# Patient Record
Sex: Female | Born: 1960 | Race: Black or African American | Hispanic: No | Marital: Single | Smoking: Never smoker
Health system: Southern US, Community
[De-identification: ages and names within clinical notes are randomized; demographics above are authoritative.]

## PROBLEM LIST (undated history)

## (undated) DIAGNOSIS — M1990 Unspecified osteoarthritis, unspecified site: Secondary | ICD-10-CM

## (undated) DIAGNOSIS — E119 Type 2 diabetes mellitus without complications: Secondary | ICD-10-CM

## (undated) DIAGNOSIS — F419 Anxiety disorder, unspecified: Secondary | ICD-10-CM

## (undated) DIAGNOSIS — I10 Essential (primary) hypertension: Secondary | ICD-10-CM

## (undated) DIAGNOSIS — R51 Headache: Secondary | ICD-10-CM

## (undated) DIAGNOSIS — N938 Other specified abnormal uterine and vaginal bleeding: Secondary | ICD-10-CM

## (undated) DIAGNOSIS — E039 Hypothyroidism, unspecified: Secondary | ICD-10-CM

## (undated) DIAGNOSIS — D649 Anemia, unspecified: Secondary | ICD-10-CM

## (undated) DIAGNOSIS — K219 Gastro-esophageal reflux disease without esophagitis: Secondary | ICD-10-CM

## (undated) DIAGNOSIS — G4733 Obstructive sleep apnea (adult) (pediatric): Secondary | ICD-10-CM

## (undated) DIAGNOSIS — H469 Unspecified optic neuritis: Secondary | ICD-10-CM

## (undated) DIAGNOSIS — Z8711 Personal history of peptic ulcer disease: Secondary | ICD-10-CM

## (undated) DIAGNOSIS — I2699 Other pulmonary embolism without acute cor pulmonale: Secondary | ICD-10-CM

## (undated) DIAGNOSIS — E739 Lactose intolerance, unspecified: Secondary | ICD-10-CM

## (undated) DIAGNOSIS — R600 Localized edema: Secondary | ICD-10-CM

## (undated) DIAGNOSIS — M2550 Pain in unspecified joint: Secondary | ICD-10-CM

## (undated) DIAGNOSIS — R0600 Dyspnea, unspecified: Secondary | ICD-10-CM

## (undated) DIAGNOSIS — Z91018 Allergy to other foods: Secondary | ICD-10-CM

## (undated) DIAGNOSIS — F329 Major depressive disorder, single episode, unspecified: Secondary | ICD-10-CM

## (undated) DIAGNOSIS — I82409 Acute embolism and thrombosis of unspecified deep veins of unspecified lower extremity: Secondary | ICD-10-CM

## (undated) DIAGNOSIS — M549 Dorsalgia, unspecified: Secondary | ICD-10-CM

## (undated) DIAGNOSIS — F32A Depression, unspecified: Secondary | ICD-10-CM

## (undated) HISTORY — DX: Allergy to other foods: Z91.018

## (undated) HISTORY — DX: Hypothyroidism, unspecified: E03.9

## (undated) HISTORY — DX: Acute embolism and thrombosis of unspecified deep veins of unspecified lower extremity: I82.409

## (undated) HISTORY — DX: Unspecified osteoarthritis, unspecified site: M19.90

## (undated) HISTORY — PX: DILATION AND CURETTAGE OF UTERUS: SHX78

## (undated) HISTORY — DX: Personal history of peptic ulcer disease: Z87.11

## (undated) HISTORY — DX: Other specified abnormal uterine and vaginal bleeding: N93.8

## (undated) HISTORY — DX: Major depressive disorder, single episode, unspecified: F32.9

## (undated) HISTORY — PX: GASTRIC BYPASS: SHX52

## (undated) HISTORY — DX: Dorsalgia, unspecified: M54.9

## (undated) HISTORY — PX: ABDOMINAL HYSTERECTOMY: SHX81

## (undated) HISTORY — DX: Gastro-esophageal reflux disease without esophagitis: K21.9

## (undated) HISTORY — PX: HERNIA REPAIR: SHX51

## (undated) HISTORY — DX: Lactose intolerance, unspecified: E73.9

## (undated) HISTORY — DX: Depression, unspecified: F32.A

## (undated) HISTORY — PX: ENDOMETRIAL ABLATION: SHX621

## (undated) HISTORY — DX: Dyspnea, unspecified: R06.00

## (undated) HISTORY — DX: Morbid (severe) obesity due to excess calories: E66.01

## (undated) HISTORY — DX: Pain in unspecified joint: M25.50

## (undated) HISTORY — DX: Unspecified optic neuritis: H46.9

## (undated) HISTORY — DX: Other pulmonary embolism without acute cor pulmonale: I26.99

## (undated) HISTORY — DX: Essential (primary) hypertension: I10

## (undated) HISTORY — PX: HYSTEROSCOPY: SHX211

## (undated) HISTORY — DX: Type 2 diabetes mellitus without complications: E11.9

## (undated) HISTORY — PX: OTHER SURGICAL HISTORY: SHX169

## (undated) HISTORY — DX: Anxiety disorder, unspecified: F41.9

## (undated) HISTORY — DX: Localized edema: R60.0

## (undated) HISTORY — DX: Obstructive sleep apnea (adult) (pediatric): G47.33

## (undated) HISTORY — DX: Anemia, unspecified: D64.9

## (undated) HISTORY — PX: JOINT REPLACEMENT: SHX530

## (undated) SURGERY — MINOR CAPSULOTOMY
Anesthesia: Choice | Laterality: Right

---

## 1998-01-17 ENCOUNTER — Other Ambulatory Visit: Admission: RE | Admit: 1998-01-17 | Discharge: 1998-01-17 | Payer: Self-pay | Admitting: Obstetrics and Gynecology

## 1998-01-22 ENCOUNTER — Encounter: Payer: Self-pay | Admitting: Obstetrics and Gynecology

## 1998-01-22 ENCOUNTER — Ambulatory Visit (HOSPITAL_COMMUNITY): Admission: RE | Admit: 1998-01-22 | Discharge: 1998-01-22 | Payer: Self-pay | Admitting: Obstetrics and Gynecology

## 2000-05-10 ENCOUNTER — Other Ambulatory Visit: Admission: RE | Admit: 2000-05-10 | Discharge: 2000-05-10 | Payer: Self-pay | Admitting: Obstetrics and Gynecology

## 2000-06-26 ENCOUNTER — Ambulatory Visit (HOSPITAL_COMMUNITY): Admission: RE | Admit: 2000-06-26 | Discharge: 2000-06-26 | Payer: Self-pay | Admitting: Obstetrics and Gynecology

## 2000-06-26 ENCOUNTER — Encounter (INDEPENDENT_AMBULATORY_CARE_PROVIDER_SITE_OTHER): Payer: Self-pay

## 2000-06-30 ENCOUNTER — Encounter: Admission: RE | Admit: 2000-06-30 | Discharge: 2000-06-30 | Payer: Self-pay | Admitting: Family Medicine

## 2000-06-30 ENCOUNTER — Encounter: Payer: Self-pay | Admitting: Family Medicine

## 2000-07-29 ENCOUNTER — Ambulatory Visit (HOSPITAL_BASED_OUTPATIENT_CLINIC_OR_DEPARTMENT_OTHER): Admission: RE | Admit: 2000-07-29 | Discharge: 2000-07-29 | Payer: Self-pay | Admitting: Family Medicine

## 2000-12-14 ENCOUNTER — Encounter: Admission: RE | Admit: 2000-12-14 | Discharge: 2000-12-14 | Payer: Self-pay | Admitting: Family Medicine

## 2000-12-14 ENCOUNTER — Encounter: Payer: Self-pay | Admitting: Family Medicine

## 2001-06-03 ENCOUNTER — Other Ambulatory Visit: Admission: RE | Admit: 2001-06-03 | Discharge: 2001-06-03 | Payer: Self-pay | Admitting: Obstetrics and Gynecology

## 2001-11-01 ENCOUNTER — Emergency Department (HOSPITAL_COMMUNITY): Admission: EM | Admit: 2001-11-01 | Discharge: 2001-11-01 | Payer: Self-pay | Admitting: Emergency Medicine

## 2002-06-08 ENCOUNTER — Other Ambulatory Visit: Admission: RE | Admit: 2002-06-08 | Discharge: 2002-06-08 | Payer: Self-pay | Admitting: Obstetrics and Gynecology

## 2003-06-21 ENCOUNTER — Other Ambulatory Visit: Admission: RE | Admit: 2003-06-21 | Discharge: 2003-06-21 | Payer: Self-pay | Admitting: Obstetrics and Gynecology

## 2004-05-19 ENCOUNTER — Inpatient Hospital Stay (HOSPITAL_COMMUNITY): Admission: EM | Admit: 2004-05-19 | Discharge: 2004-05-25 | Payer: Self-pay | Admitting: Emergency Medicine

## 2004-06-24 ENCOUNTER — Other Ambulatory Visit: Admission: RE | Admit: 2004-06-24 | Discharge: 2004-06-24 | Payer: Self-pay | Admitting: Obstetrics and Gynecology

## 2004-07-05 ENCOUNTER — Emergency Department (HOSPITAL_COMMUNITY): Admission: EM | Admit: 2004-07-05 | Discharge: 2004-07-05 | Payer: Self-pay | Admitting: Emergency Medicine

## 2005-06-15 ENCOUNTER — Encounter: Admission: RE | Admit: 2005-06-15 | Discharge: 2005-06-15 | Payer: Self-pay | Admitting: Specialist

## 2005-06-24 ENCOUNTER — Other Ambulatory Visit: Admission: RE | Admit: 2005-06-24 | Discharge: 2005-06-24 | Payer: Self-pay | Admitting: *Deleted

## 2006-06-25 ENCOUNTER — Ambulatory Visit (HOSPITAL_COMMUNITY): Admission: RE | Admit: 2006-06-25 | Discharge: 2006-06-25 | Payer: Self-pay | Admitting: Specialist

## 2006-07-14 ENCOUNTER — Other Ambulatory Visit: Admission: RE | Admit: 2006-07-14 | Discharge: 2006-07-14 | Payer: Self-pay | Admitting: *Deleted

## 2007-01-13 ENCOUNTER — Emergency Department (HOSPITAL_COMMUNITY): Admission: EM | Admit: 2007-01-13 | Discharge: 2007-01-13 | Payer: Self-pay | Admitting: Emergency Medicine

## 2007-07-27 ENCOUNTER — Other Ambulatory Visit: Admission: RE | Admit: 2007-07-27 | Discharge: 2007-07-27 | Payer: Self-pay | Admitting: *Deleted

## 2007-08-08 ENCOUNTER — Inpatient Hospital Stay (HOSPITAL_COMMUNITY): Admission: EM | Admit: 2007-08-08 | Discharge: 2007-08-17 | Payer: Self-pay | Admitting: Emergency Medicine

## 2007-08-09 ENCOUNTER — Encounter: Payer: Self-pay | Admitting: Internal Medicine

## 2007-08-09 ENCOUNTER — Encounter (INDEPENDENT_AMBULATORY_CARE_PROVIDER_SITE_OTHER): Payer: Self-pay | Admitting: *Deleted

## 2007-08-09 ENCOUNTER — Ambulatory Visit: Payer: Self-pay | Admitting: Vascular Surgery

## 2007-12-01 ENCOUNTER — Ambulatory Visit (HOSPITAL_COMMUNITY): Admission: RE | Admit: 2007-12-01 | Discharge: 2007-12-01 | Payer: Self-pay | Admitting: Family Medicine

## 2007-12-02 ENCOUNTER — Ambulatory Visit (HOSPITAL_COMMUNITY): Admission: RE | Admit: 2007-12-02 | Discharge: 2007-12-02 | Payer: Self-pay | Admitting: Family Medicine

## 2007-12-06 ENCOUNTER — Ambulatory Visit: Payer: Self-pay | Admitting: Oncology

## 2007-12-14 ENCOUNTER — Encounter: Admission: RE | Admit: 2007-12-14 | Discharge: 2007-12-14 | Payer: Self-pay | Admitting: Family Medicine

## 2007-12-20 HISTORY — PX: KNEE ARTHROSCOPY: SUR90

## 2007-12-20 LAB — CBC WITH DIFFERENTIAL/PLATELET
BASO%: 1 % (ref 0.0–2.0)
Basophils Absolute: 0.1 10*3/uL (ref 0.0–0.1)
EOS%: 3.2 % (ref 0.0–7.0)
Eosinophils Absolute: 0.2 10*3/uL (ref 0.0–0.5)
HCT: 39.1 % (ref 34.8–46.6)
HGB: 13.1 g/dL (ref 11.6–15.9)
LYMPH%: 25 % (ref 14.0–48.0)
MCH: 28.3 pg (ref 26.0–34.0)
MCHC: 33.5 g/dL (ref 32.0–36.0)
MCV: 84.3 fL (ref 81.0–101.0)
MONO#: 0.5 10*3/uL (ref 0.1–0.9)
MONO%: 7.2 % (ref 0.0–13.0)
NEUT#: 4.7 10*3/uL (ref 1.5–6.5)
NEUT%: 63.6 % (ref 39.6–76.8)
Platelets: 154 10*3/uL (ref 145–400)
RBC: 4.63 10*6/uL (ref 3.70–5.32)
RDW: 15.6 % — ABNORMAL HIGH (ref 11.3–14.5)
WBC: 7.5 10*3/uL (ref 3.9–10.0)
lymph#: 1.9 10*3/uL (ref 0.9–3.3)

## 2008-01-02 LAB — HYPERCOAGULABLE PANEL, COMPREHENSIVE RET.
AntiThromb III Func: 124 % (ref 76–126)
Anticardiolipin IgA: 8 [APL'U] (ref <13)
Anticardiolipin IgG: <7 [GPL'U] (ref <11)
Anticardiolipin IgM: <7 [MPL'U] (ref <10)
Beta-2 Glyco I IgG: <4 U/mL (ref <20)
Beta-2-Glycoprotein I IgA: <4 U/mL (ref <10)
Beta-2-Glycoprotein I IgM: <4 U/mL (ref <10)
DRVVT 1:1 Mix: 41.4 secs (ref 36.1–47.0)
DRVVT: 101 secs — ABNORMAL HIGH (ref 36.1–47.0)
Homocysteine: 11 umol/L (ref 4.0–15.4)
Lupus Anticoagulant: DETECTED
PTT Lupus Anticoagulant: 105.1 secs — ABNORMAL HIGH (ref 36.3–48.8)
PTTLA 4:1 Mix: 62.3 secs — ABNORMAL HIGH (ref 36.3–48.8)
PTTLA Confirmation: 9.8 secs — ABNORMAL HIGH (ref <8.0)
Protein C Activity: 15 % — ABNORMAL LOW (ref 75–133)
Protein C, Total: 51 % — ABNORMAL LOW (ref 70–140)
Protein S Activity: 26 % — ABNORMAL LOW (ref 69–129)
Protein S Ag, Total: 64 % — ABNORMAL LOW (ref 70–140)

## 2008-01-02 LAB — COMPREHENSIVE METABOLIC PANEL
ALT: 28 U/L (ref 0–35)
AST: 16 U/L (ref 0–37)
Albumin: 4 g/dL (ref 3.5–5.2)
Alkaline Phosphatase: 42 U/L (ref 39–117)
BUN: 18 mg/dL (ref 6–23)
CO2: 19 mEq/L (ref 19–32)
Calcium: 9.5 mg/dL (ref 8.4–10.5)
Chloride: 109 mEq/L (ref 96–112)
Creatinine, Ser: 1.37 mg/dL — ABNORMAL HIGH (ref 0.40–1.20)
Glucose, Bld: 105 mg/dL — ABNORMAL HIGH (ref 70–99)
Potassium: 3.8 mEq/L (ref 3.5–5.3)
Sodium: 142 mEq/L (ref 135–145)
Total Bilirubin: 0.5 mg/dL (ref 0.3–1.2)
Total Protein: 6.8 g/dL (ref 6.0–8.3)

## 2008-01-05 LAB — HEXAGONAL PHOSPHOLIPID NEUTRALIZATION: Hex Phosph Neut Test: POSITIVE — AB

## 2008-01-25 ENCOUNTER — Ambulatory Visit: Payer: Self-pay | Admitting: Internal Medicine

## 2008-01-25 DIAGNOSIS — I6789 Other cerebrovascular disease: Secondary | ICD-10-CM | POA: Insufficient documentation

## 2008-01-25 DIAGNOSIS — J45909 Unspecified asthma, uncomplicated: Secondary | ICD-10-CM

## 2008-01-25 DIAGNOSIS — R0989 Other specified symptoms and signs involving the circulatory and respiratory systems: Secondary | ICD-10-CM

## 2008-01-25 DIAGNOSIS — G4730 Sleep apnea, unspecified: Secondary | ICD-10-CM | POA: Insufficient documentation

## 2008-01-25 DIAGNOSIS — J4540 Moderate persistent asthma, uncomplicated: Secondary | ICD-10-CM | POA: Insufficient documentation

## 2008-01-25 DIAGNOSIS — B1710 Acute hepatitis C without hepatic coma: Secondary | ICD-10-CM | POA: Insufficient documentation

## 2008-01-25 DIAGNOSIS — R0609 Other forms of dyspnea: Secondary | ICD-10-CM

## 2008-01-25 DIAGNOSIS — J45998 Other asthma: Secondary | ICD-10-CM | POA: Insufficient documentation

## 2008-01-25 DIAGNOSIS — I10 Essential (primary) hypertension: Secondary | ICD-10-CM | POA: Insufficient documentation

## 2008-02-16 ENCOUNTER — Ambulatory Visit: Payer: Self-pay | Admitting: Surgery

## 2008-03-19 ENCOUNTER — Ambulatory Visit: Payer: Self-pay | Admitting: Oncology

## 2008-05-22 ENCOUNTER — Ambulatory Visit (HOSPITAL_COMMUNITY): Admission: RE | Admit: 2008-05-22 | Discharge: 2008-05-22 | Payer: Self-pay | Admitting: Surgery

## 2008-05-22 ENCOUNTER — Ambulatory Visit: Payer: Self-pay | Admitting: Surgery

## 2008-05-23 HISTORY — PX: GASTRIC BYPASS: SHX52

## 2008-07-02 ENCOUNTER — Ambulatory Visit: Payer: Self-pay | Admitting: Surgery

## 2008-08-23 ENCOUNTER — Ambulatory Visit (HOSPITAL_COMMUNITY): Admission: RE | Admit: 2008-08-23 | Discharge: 2008-08-24 | Payer: Self-pay | Admitting: Obstetrics and Gynecology

## 2008-10-02 ENCOUNTER — Ambulatory Visit: Payer: Self-pay | Admitting: Surgery

## 2008-10-02 ENCOUNTER — Ambulatory Visit (HOSPITAL_COMMUNITY): Admission: RE | Admit: 2008-10-02 | Discharge: 2008-10-02 | Payer: Self-pay | Admitting: Surgery

## 2009-10-31 ENCOUNTER — Ambulatory Visit (HOSPITAL_COMMUNITY): Admission: RE | Admit: 2009-10-31 | Discharge: 2009-10-31 | Payer: Self-pay | Admitting: Ophthalmology

## 2009-10-31 HISTORY — PX: GLAUCOMA SURGERY: SHX656

## 2009-11-16 ENCOUNTER — Ambulatory Visit (HOSPITAL_COMMUNITY): Admission: EM | Admit: 2009-11-16 | Discharge: 2009-11-16 | Payer: Self-pay | Admitting: Emergency Medicine

## 2009-11-16 HISTORY — PX: EYE SURGERY: SHX253

## 2010-05-11 ENCOUNTER — Encounter: Payer: Self-pay | Admitting: Internal Medicine

## 2010-05-20 NOTE — Assessment & Plan Note (Signed)
Summary: Pulmonary/ preop eval   Visit Type:  Initial Consult Referred by:  vida bland PCP:  Laurey Morale  Chief Complaint:  pulmonary consult for surgery clearance......SOB...Marland KitchenMarland KitchenDiabetic.  History of Present Illness: 50 yobf with morbid obesity planning on bariatric surgery at [facility]  January 25, 2008 preop eval requested by Parke Simmers for doe  x  2007 with h/o PEx 2 last one April 09 , states breathing is better than usual but still using [facility] parking and can't do aisles at grocery store due to knees but sleeps well on cpap. Sees Dr Nira Retort for allergies = "congestion" with no itching or sneezing, on advair not sure it helps, thinks the problem was PE,  on shots x 1 year, not sure they help, stopped them 1 mo ago.    Pt denies any significant sore throat, nasal congestion or excess secretions, fever, chills, sweats, unintended wt loss, pleuritic or exertional cp, orthopnea pnd or leg swelling.  Pt also denies any obvious fluctuation in symptoms with weather or environmental change or other alleviating or aggravating factors.     Pt denies any increase in rescue therapy over baseline, denies waking up needing it or having early am exacerbations of coughing/wheezing/ or dyspnea      Updated Prior Medication List: ADVAIR DISKUS 250-50 MCG/DOSE MISC (FLUTICASONE-SALMETEROL) 1 puff in am and pm NEXIUM 40 MG CPDR (ESOMEPRAZOLE MAGNESIUM) once daily ALPHAGAN P 0.1 % SOLN (BRIMONIDINE TARTRATE) 1 drop each am and pm AZOPT 1 % SUSP (BRINZOLAMIDE) 1 drop three times a day XALATAN 0.005 % SOLN (LATANOPROST) 1 drop two times a day MAXZIDE-25 37.5-25 MG TABS (TRIAMTERENE-HCTZ) 1/2 tab in am AVAPRO 300 MG TABS (IRBESARTAN) once daily FLEXERIL 10 MG TABS (CYCLOBENZAPRINE HCL) 1 in pm QUALAQUIN 324 MG CAPS (QUININE SULFATE) once daily MEGESTROL ACETATE 40 MG TABS (MEGESTROL ACETATE) once daily KETOPROFEN 75 MG CAPS (KETOPROFEN) three times a day WARFARIN SODIUM 10 MG TABS (WARFARIN SODIUM) once daily GLIPIZIDE  10 MG XR24H-TAB (GLIPIZIDE) two times a day METFORMIN HCL 500 MG TABS (METFORMIN HCL) two times a day NOVOLOG PENFILL 100 UNIT/ML SOLN (INSULIN ASPART) three times a day LANTUS 100 UNIT/ML SOLN (INSULIN GLARGINE) two times a day CELEBREX 200 MG CAPS (CELECOXIB) once daily DILTIAZEM HCL CR 240 MG XR24H-CAP (DILTIAZEM HCL) at bedtime * MAGIC MOUTHWASH four times a day XOPENEX CONCENTRATE 1.25 MG/0.5ML NEBU (LEVALBUTEROL HCL) four times a day ROZEREM 8 MG TABS (RAMELTEON) once daily CLARINEX 5 MG TABS (DESLORATADINE) once daily * ASTERLINE NASAL SPRAY 2 sprays as needed two times a day NASACORT AQ 55 MCG/ACT AERS (TRIAMCINOLONE ACETONIDE(NASAL)) 2 sprays as needed once daily TYLENOL EX ST ARTHRITIS PAIN 500 MG TABS (ACETAMINOPHEN) as needed FISH OIL  OIL (FISH OIL) once daily ALAVERT ALLERGY/SINUS 5-120 MG XR12H-TAB (LORATADINE-PSEUDOEPHEDRINE) as needed PAU D ARCO 500 MG CAPS (MISC NATURAL PRODUCTS) once daily CINNAMON 500 MG CAPS (CINNAMON) once daily  Current Allergies (reviewed today): No known allergies   Past Medical History:    SOB    Diabetic    Morbid Obesity  peaked at 445       - Target wt  = 174   for BMI < 30     Asthma    Hypertension    Sleep Apnea  Past Surgical History:    DNC    historctomy    arthroscopy on knee 2008   Family History:    neg resp dz or atopy to her knowledge  Social History:    single  lives alone    Editor, commissioning    no children   Risk Factors:  Tobacco use:  never Alcohol use:  no   Review of Systems  The patient denies anorexia, fever, weight loss, weight gain, vision loss, decreased hearing, hoarseness, chest pain, syncope, peripheral edema, prolonged cough, headaches, hemoptysis, abdominal pain, melena, hematochezia, severe indigestion/heartburn, hematuria, incontinence, genital sores, muscle weakness, suspicious skin lesions, transient blindness, difficulty walking, depression, unusual weight change, abnormal bleeding,  enlarged lymph nodes, and angioedema.     Vital Signs:  Patient Profile:   50 Years Old Female Height:     65 inches Weight:      422 pounds BMI:     70.48 O2 Sat:      98 % O2 treatment:    Room Air Temp:     97.9 degrees F oral Pulse rate:   122 / minute BP sitting:   128 / 74  (left arm) Cuff size:   large  Pt. in pain?   no  Vitals Entered By: Clarise Cruz Duncan Dull) (January 25, 2008 11:53 AM)                  Physical Exam  massively obese pleasant ambulatory black female in no acute distress. wt 422 January 25, 2008  Afeb with normal vital signs HEENT: nl dentition, turbinates, and orophanx. Nl external ear canals without cough reflex Neck without JVD/Nodes/TM Lungs clear to A and P bilaterally without cough on insp or exp maneuvers, relatively short insp/exp RRR no s3 or murmur or increase in P2 Abd soft and benign with nl excursion in the supine position. No bruits or organomegaly Ext warm without calf tenderness, cyanosis clubbing, trace edema bilaterally Skin warm and dry without lesions      Pulmonary Function Test Height (in.): 65 Gender: Female    Impression & Recommendations:  Problem # 1:  DYSPNEA (ICD-786.09) I suspect that she is right that her radius problems with dyspnea were secondary to pulmonary embolism and restrictive physiology from obesity, not allergies or asthma.  This is actually problematic because without surgery she is unlikely to address the problem the cause the emboli in the first place, that is morbid obesity.  With surgery she stands a very high risk of further complications of thromboembolism.  Therefore she might be a good candidate for a temporary IVC filter.  I see no other strong contraindications to surgery and feel that the risk would be worth a long-term benefit Her updated medication list for this problem includes:    Advair Diskus 250-50 Mcg/dose Misc (Fluticasone-salmeterol) .Marland Kitchen... 1 puff in am and pm    Maxzide-25  37.5-25 Mg Tabs (Triamterene-hctz) .Marland Kitchen... 1/2 tab in am    Xopenex Concentrate 1.25 Mg/0.35ml Nebu (Levalbuterol hcl) .Marland Kitchen... Four times a day  Orders: Consultation Level V (16109)   Problem # 2:  ASTHMA (ICD-493.90) her main symptoms of asthma are dyspnea and congestion which are nonspecific and may or may not be better from Advair.  I have no problem with her tapering off of it postoperatively but for now would continue at.  Without more convincing symptoms of allergy, that is itching sneezing or wheezing, I would not encourage her to restart allergy shots. a morbidly obese patient in fact I have had much more success treating reflux with ppi plus approp dietary restrictions  than  allergies in terms of addressing their nonspecific respiratory complaints like "congestion." Her updated medication list for this problem includes:  Advair Diskus 250-50 Mcg/dose Misc (Fluticasone-salmeterol) .Marland Kitchen... 1 puff in am and pm    Xopenex Concentrate 1.25 Mg/0.51ml Nebu (Levalbuterol hcl) .Marland Kitchen... Four times a day  Orders: Consultation Level V (16109)   Medications Added to Medication List This Visit: 1)  Advair Diskus 250-50 Mcg/dose Misc (Fluticasone-salmeterol) .Marland Kitchen.. 1 puff in am and pm 2)  Nexium 40 Mg Cpdr (Esomeprazole magnesium) .... Once daily 3)  Alphagan P 0.1 % Soln (Brimonidine tartrate) .Marland Kitchen.. 1 drop each am and pm 4)  Azopt 1 % Susp (Brinzolamide) .Marland Kitchen.. 1 drop three times a day 5)  Xalatan 0.005 % Soln (Latanoprost) .Marland Kitchen.. 1 drop two times a day 6)  Maxzide-25 37.5-25 Mg Tabs (Triamterene-hctz) .... 1/2 tab in am 7)  Avapro 300 Mg Tabs (Irbesartan) .... Once daily 8)  Flexeril 10 Mg Tabs (Cyclobenzaprine hcl) .Marland Kitchen.. 1 in pm 9)  Qualaquin 324 Mg Caps (Quinine sulfate) .... Once daily 10)  Megestrol Acetate 40 Mg Tabs (Megestrol acetate) .... Once daily 11)  Ketoprofen 75 Mg Caps (Ketoprofen) .... Three times a day 12)  Warfarin Sodium 10 Mg Tabs (Warfarin sodium) .... Once daily 13)  Glipizide 10  Mg Xr24h-tab (Glipizide) .... Two times a day 14)  Metformin Hcl 500 Mg Tabs (Metformin hcl) .... Two times a day 15)  Novolog Penfill 100 Unit/ml Soln (Insulin aspart) .... Three times a day 16)  Lantus 100 Unit/ml Soln (Insulin glargine) .... Two times a day 17)  Celebrex 200 Mg Caps (Celecoxib) .... Once daily 18)  Diltiazem Hcl Cr 240 Mg Xr24h-cap (Diltiazem hcl) .... At bedtime 19)  Magic Mouthwash  .... Four times a day 20)  Xopenex Concentrate 1.25 Mg/0.45ml Nebu (Levalbuterol hcl) .... Four times a day 21)  Rozerem 8 Mg Tabs (Ramelteon) .... Once daily 22)  Clarinex 5 Mg Tabs (Desloratadine) .... Once daily 23)  Asterline Nasal Spray  .Marland Kitchen.. 2 sprays as needed two times a day 24)  Nasacort Aq 55 Mcg/act Aers (Triamcinolone acetonide(nasal)) .... 2 sprays as needed once daily 25)  Tylenol Ex St Arthritis Pain 500 Mg Tabs (Acetaminophen) .... As needed 26)  Fish Oil Oil (Fish oil) .... Once daily 27)  Alavert Allergy/sinus 5-120 Mg Xr12h-tab (Loratadine-pseudoephedrine) .... As needed 28)  Pau D Arco 500 Mg Caps (Misc natural products) .... Once daily 29)  Cinnamon 500 Mg Caps (Cinnamon) .... Once daily  Complete Medication List: 1)  Advair Diskus 250-50 Mcg/dose Misc (Fluticasone-salmeterol) .Marland Kitchen.. 1 puff in am and pm 2)  Nexium 40 Mg Cpdr (Esomeprazole magnesium) .... Once daily 3)  Alphagan P 0.1 % Soln (Brimonidine tartrate) .Marland Kitchen.. 1 drop each am and pm 4)  Azopt 1 % Susp (Brinzolamide) .Marland Kitchen.. 1 drop three times a day 5)  Xalatan 0.005 % Soln (Latanoprost) .Marland Kitchen.. 1 drop two times a day 6)  Maxzide-25 37.5-25 Mg Tabs (Triamterene-hctz) .... 1/2 tab in am 7)  Avapro 300 Mg Tabs (Irbesartan) .... Once daily 8)  Flexeril 10 Mg Tabs (Cyclobenzaprine hcl) .Marland Kitchen.. 1 in pm 9)  Qualaquin 324 Mg Caps (Quinine sulfate) .... Once daily 10)  Megestrol Acetate 40 Mg Tabs (Megestrol acetate) .... Once daily 11)  Ketoprofen 75 Mg Caps (Ketoprofen) .... Three times a day 12)  Warfarin Sodium 10 Mg Tabs  (Warfarin sodium) .... Once daily 13)  Glipizide 10 Mg Xr24h-tab (Glipizide) .... Two times a day 14)  Metformin Hcl 500 Mg Tabs (Metformin hcl) .... Two times a day 15)  Novolog Penfill 100 Unit/ml Soln (Insulin aspart) .... Three times a  day 16)  Lantus 100 Unit/ml Soln (Insulin glargine) .... Two times a day 17)  Celebrex 200 Mg Caps (Celecoxib) .... Once daily 18)  Diltiazem Hcl Cr 240 Mg Xr24h-cap (Diltiazem hcl) .... At bedtime 19)  Magic Mouthwash  .... Four times a day 20)  Xopenex Concentrate 1.25 Mg/0.61ml Nebu (Levalbuterol hcl) .... Four times a day 21)  Rozerem 8 Mg Tabs (Ramelteon) .... Once daily 22)  Clarinex 5 Mg Tabs (Desloratadine) .... Once daily 23)  Asterline Nasal Spray  .Marland Kitchen.. 2 sprays as needed two times a day 24)  Nasacort Aq 55 Mcg/act Aers (Triamcinolone acetonide(nasal)) .... 2 sprays as needed once daily 25)  Tylenol Ex St Arthritis Pain 500 Mg Tabs (Acetaminophen) .... As needed 26)  Fish Oil Oil (Fish oil) .... Once daily 27)  Alavert Allergy/sinus 5-120 Mg Xr12h-tab (Loratadine-pseudoephedrine) .... As needed 28)  Pau D Arco 500 Mg Caps (Misc natural products) .... Once daily 29)  Cinnamon 500 Mg Caps (Cinnamon) .... Once daily   Patient Instructions: 1)  Discuss temporary IVC  filter with Dr Madilyn Fireman 2)  Your risk of not fixing the problem is higher over the long run than going ahead with the surgery. 3)  ok to stop adair but only after surgery and stop night time dose first then the day first. 4)  will send your notes to [facility] fax [phone] attn  Verdia Kuba RN   ]

## 2010-07-06 LAB — CBC
HCT: 41 % (ref 36.0–46.0)
Hemoglobin: 13.5 g/dL (ref 12.0–15.0)
MCH: 28.4 pg (ref 26.0–34.0)
MCHC: 32.9 g/dL (ref 30.0–36.0)
MCV: 86.3 fL (ref 78.0–100.0)
Platelets: 134 10*3/uL — ABNORMAL LOW (ref 150–400)
RBC: 4.75 MIL/uL (ref 3.87–5.11)
RDW: 14.9 % (ref 11.5–15.5)
WBC: 5.2 10*3/uL (ref 4.0–10.5)

## 2010-07-06 LAB — BASIC METABOLIC PANEL
BUN: 10 mg/dL (ref 6–23)
CO2: 28 mEq/L (ref 19–32)
Calcium: 9 mg/dL (ref 8.4–10.5)
Chloride: 110 mEq/L (ref 96–112)
Creatinine, Ser: 1.13 mg/dL (ref 0.4–1.2)
GFR calc Af Amer: >60 mL/min (ref >60)
GFR calc non Af Amer: 51 mL/min — ABNORMAL LOW (ref >60)
Glucose, Bld: 80 mg/dL (ref 70–99)
Potassium: 3.8 mEq/L (ref 3.5–5.1)
Sodium: 143 mEq/L (ref 135–145)

## 2010-07-06 LAB — SURGICAL PCR SCREEN
MRSA, PCR: NEGATIVE
Staphylococcus aureus: NEGATIVE

## 2010-07-06 LAB — PROTIME-INR
INR: 1.24 (ref 0.00–1.49)
INR: 1.59 — ABNORMAL HIGH (ref 0.00–1.49)
Prothrombin Time: 15.5 seconds — ABNORMAL HIGH (ref 11.6–15.2)
Prothrombin Time: 18.8 seconds — ABNORMAL HIGH (ref 11.6–15.2)

## 2010-07-06 LAB — APTT: aPTT: 33 seconds (ref 24–37)

## 2010-07-09 NOTE — Op Note (Signed)
[name], [name]               ACCOUNT NO.:  [id number]  MEDICAL RECORD NO.:  [id number]          PATIENT TYPE:  INP  LOCATION:  2550                         FACILITY:  [facility]  PHYSICIAN:  Chalmers Guest, M.D.     DATE OF BIRTH:  [birth year]  DATE OF PROCEDURE:  11/16/2009 DATE OF DISCHARGE:  11/16/2009                              OPERATIVE REPORT  PREOPERATIVE DIAGNOSIS:  Flat anterior chamber with bleb leak, right eye following previous glaucoma surgery.  POSTOPERATIVE DIAGNOSIS:  Flat anterior chamber with bleb leak, right eye following previous glaucoma surgery.  PROCEDURES:  Revision of operative wound and re-formation of anterior chamber.  COMPLICATIONS:  None.  ANESTHESIA:  Topical 2% Xylocaine with epinephrine and topical tetracaine.  The patient was taken to the operating room where the aforementioned topical anesthetics were applied to the eye.  The patient was given mild sedation, and the patient's face was prepped and draped in usual sterile fashion.  With the surgeon sitting superiorly, the contact lens had been previously placed were removed from the eye.  It was noted that there was significant diffuse corneal edema centrally.  The anterior chamber was flat.  The area of leakage was noted to be approximately 4 mm superior to the limbus, anterior to the apex of the scleral flap near the apex of the scleral flap.  It was also noted with fluorescein testing that there was another conjunctival dehiscence adjacent to the scleral flap.  Therefore, a 9-0 Vicryl on BV 100 needle was placed using a mattress suture and tied.  The second site was similarly tied. Following this, fluorescein was applied to the eye.  No leakage was noted; however, the anterior chamber remained completely flat. Therefore, a blade was used to enter the eye using 0.1 to fixate the globe.  Then, a 30 gauge needle was used to enter the chamber and the eye was injected however with corneal  opacity.  I was not able to clearly determine if I was inside the anterior chamber, and there was increased opacity noted with injection indicating injection into the stroma.  Therefore the needle was withdrawn.  Topical pilocarpine 4% was applied to the eye because the pupil was dilated at 8 mm.  After awaiting 10-15 minutes after applying pilocarpine, q.5 minutes x3, the pupil decreased to 6 mm.  This allowed a better visualization of the anterior chamber.  The 30-gauge needle was then used from the inferior cornea at 4 o'clock position advancing superiorly to enter the anterior chamber.  BSS was injected in the eye.  The chamber was seen to deepen. The bleb elevated.  No leakage was noted at the previous suture sites; however, there was leakage at the area where the 0.12 had been used to fixate the globe.  Therefore, the 9-0 Vicryl on a BV 100 needle was again used in a mattress fashion to close this defect.  The bleb further elevated.  Fluorescein testing was Seidel negative.  Therefore the eye was formed.  The tube was in the anterior chamber in good position.  There was small air bubble left in the anterior chamber. The lid speculum  was removed.  TobraDex was applied to the eye.  Patch and Fox shield were placed, and the patient returned to the recovery area in stable condition.     Chalmers Guest, M.D.    RW/MEDQ  D:  11/16/2009  T:  11/17/2009  Job:  [id number]  Electronically Signed by Chalmers Guest M.D. on 07/09/2010 04:37:59 PM

## 2010-07-09 NOTE — Op Note (Signed)
NAMERYANE, [name]               ACCOUNT NO.:  [id number]  MEDICAL RECORD NO.:  [id number]          PATIENT TYPE:  LOCATION:                                 FACILITY:  PHYSICIAN:  Chalmers Guest, M.D.          DATE OF BIRTH:  DATE OF PROCEDURE:  11/01/2009 DATE OF DISCHARGE:                              OPERATIVE REPORT  PREOPERATIVE DIAGNOSIS:  Uncontrolled advanced primary open-angle glaucoma, right eye despite maximum medical therapy and laser treatment.  PROCEDURE:  An ExPress mini shunt with mitomycin C.  COMPLICATIONS:  None.  ANESTHESIA:  Xylocaine 2% with epinephrine in a 50:50 mixture with 0.75% Marcaine with ampule of Wydase.  PROCEDURE:  The patient was given a peribulbar block under monitored anesthesia in the operating room and then a pressure was applied to the eye.  The patient's face was then prepped and draped in the usual sterile fashion with the surgeon sitting superiorly and the operating microscope in position.  A 6-0 nylon suture was used to infraduct the eye with the eye in infraductal position.  The conjunctiva was grasped with a Hoskin forceps.  The patient felt some discomfort, therefore, topical tetracaine and topical Xylocaine were applied to the eye and the patient was comfortable.  The conjunctiva was grasped and an incision was made at the limbus at the 12 o'clock position extending nasally using a Westcott scissor.  Blunt dissection was carried out posteriorly. Bleeding was controlled with penpoint cautery.  Following this, a 45-degree blade was used to fashion a half-thickness scleral flap, triangular flap with a 4-mm base at the limbus.  The scleral flap was then elevated using a Colibri forceps, a Grieshaber 5700 blade was used to dissect to the limbus. Following this, mitomycin C 0.4 mg/mL was placed on a Gelfoam sponge and placed under the conjunctiva for 3 minutes and then removed and irrigated with 60 mL of balanced salt solution.  At  this point, [facility] blade was used to enter the anterior chamber and Provisc was injected in the eye.  The scleral flap was then elevated and a 27-gauge needle was used on the scleral flap to enter the anterior chamber.  The mini shunt which was an ExPress preloaded on EDS, lot number [id number], P200 shunt was then placed into the chamber and rotated into position with some resistance at the sclera.  After it was in excellent position, the scleral flap was sutured with 3 interrupted 10-0 nylon sutures. Following this, the conjunctiva was then reapposed at the limbus using a 9-0 Vicryl on a vascular needle.  After watertight closure was achieved by putting wing sutures and a suture in the middle, BSS was injected in anterior chamber to burp out the viscoelastic, the bleb elevated.  The incision was Seidel negative.  A subconjunctival injection of Kenalog 4 mg was given in the inferior nasal quadrant.  Topical TobraDex was placed on the eye.  A patch and Fox shield were placed, and the patient returned to recovery area in stable condition.  Complications were none.     Chalmers Guest, M.D.    RW/MEDQ  D:  10/31/2009  T:  11/01/2009  Job:  [id number]  Electronically Signed by Chalmers Guest M.D. on 07/09/2010 04:37:26 PM

## 2010-07-28 LAB — PROTIME-INR
INR: 1.3 (ref 0.00–1.49)
Prothrombin Time: 17.1 seconds — ABNORMAL HIGH (ref 11.6–15.2)

## 2010-07-28 LAB — POCT I-STAT, CHEM 8
BUN: 13 mg/dL (ref 6–23)
Calcium, Ion: 1.08 mmol/L — ABNORMAL LOW (ref 1.12–1.32)
Chloride: 113 mEq/L — ABNORMAL HIGH (ref 96–112)
Creatinine, Ser: 1.4 mg/dL — ABNORMAL HIGH (ref 0.4–1.2)
Glucose, Bld: 107 mg/dL — ABNORMAL HIGH (ref 70–99)
HCT: 44 % (ref 36.0–46.0)
Hemoglobin: 15 g/dL (ref 12.0–15.0)
Potassium: 3.4 mEq/L — ABNORMAL LOW (ref 3.5–5.1)
Sodium: 142 mEq/L (ref 135–145)
TCO2: 19 mmol/L (ref 0–100)

## 2010-07-29 LAB — CBC
HCT: 34.4 % — ABNORMAL LOW (ref 36.0–46.0)
HCT: 36.7 % (ref 36.0–46.0)
HCT: 38.8 % (ref 36.0–46.0)
HCT: 40.7 % (ref 36.0–46.0)
Hemoglobin: 11.7 g/dL — ABNORMAL LOW (ref 12.0–15.0)
Hemoglobin: 12.4 g/dL (ref 12.0–15.0)
Hemoglobin: 13 g/dL (ref 12.0–15.0)
Hemoglobin: 13.6 g/dL (ref 12.0–15.0)
MCHC: 33.3 g/dL (ref 30.0–36.0)
MCHC: 33.4 g/dL (ref 30.0–36.0)
MCHC: 33.7 g/dL (ref 30.0–36.0)
MCHC: 34 g/dL (ref 30.0–36.0)
MCV: 84.3 fL (ref 78.0–100.0)
MCV: 84.8 fL (ref 78.0–100.0)
MCV: 84.9 fL (ref 78.0–100.0)
MCV: 85.7 fL (ref 78.0–100.0)
Platelets: 146 10*3/uL — ABNORMAL LOW (ref 150–400)
Platelets: 147 10*3/uL — ABNORMAL LOW (ref 150–400)
Platelets: 150 10*3/uL (ref 150–400)
Platelets: 176 10*3/uL (ref 150–400)
RBC: 4.05 MIL/uL (ref 3.87–5.11)
RBC: 4.35 MIL/uL (ref 3.87–5.11)
RBC: 4.53 MIL/uL (ref 3.87–5.11)
RBC: 4.79 MIL/uL (ref 3.87–5.11)
RDW: 17.2 % — ABNORMAL HIGH (ref 11.5–15.5)
RDW: 17.2 % — ABNORMAL HIGH (ref 11.5–15.5)
RDW: 17.5 % — ABNORMAL HIGH (ref 11.5–15.5)
RDW: 17.5 % — ABNORMAL HIGH (ref 11.5–15.5)
WBC: 6 10*3/uL (ref 4.0–10.5)
WBC: 6.3 10*3/uL (ref 4.0–10.5)
WBC: 7.2 10*3/uL (ref 4.0–10.5)
WBC: 8.9 10*3/uL (ref 4.0–10.5)

## 2010-07-29 LAB — BASIC METABOLIC PANEL
BUN: 6 mg/dL (ref 6–23)
CO2: 23 mEq/L (ref 19–32)
Calcium: 9.3 mg/dL (ref 8.4–10.5)
Chloride: 106 mEq/L (ref 96–112)
Creatinine, Ser: 1.06 mg/dL (ref 0.4–1.2)
GFR calc Af Amer: >60 mL/min (ref >60)
GFR calc non Af Amer: 55 mL/min — ABNORMAL LOW (ref >60)
Glucose, Bld: 103 mg/dL — ABNORMAL HIGH (ref 70–99)
Potassium: 3 mEq/L — ABNORMAL LOW (ref 3.5–5.1)
Sodium: 138 mEq/L (ref 135–145)

## 2010-08-05 LAB — POCT I-STAT, CHEM 8
BUN: 17 mg/dL (ref 6–23)
Calcium, Ion: 1.2 mmol/L (ref 1.12–1.32)
Chloride: 111 mEq/L (ref 96–112)
Creatinine, Ser: 1.8 mg/dL — ABNORMAL HIGH (ref 0.4–1.2)
Glucose, Bld: 116 mg/dL — ABNORMAL HIGH (ref 70–99)
HCT: 40 % (ref 36.0–46.0)
Hemoglobin: 13.6 g/dL (ref 12.0–15.0)
Potassium: 4 mEq/L (ref 3.5–5.1)
Sodium: 144 mEq/L (ref 135–145)
TCO2: 21 mmol/L (ref 0–100)

## 2010-08-05 LAB — PROTIME-INR
INR: 1.6 — ABNORMAL HIGH (ref 0.00–1.49)
Prothrombin Time: 20.2 seconds — ABNORMAL HIGH (ref 11.6–15.2)

## 2010-08-05 LAB — GLUCOSE, CAPILLARY
Glucose-Capillary: 134 mg/dL — ABNORMAL HIGH (ref 70–99)
Glucose-Capillary: 76 mg/dL (ref 70–99)

## 2010-09-02 NOTE — Assessment & Plan Note (Signed)
OFFICE VISIT   [name], [name]  DOB:  Apr 02, 1961                                       07/02/2008  [id number]#:[id number]   REASON FOR VISIT:  IVC filter placement.   HISTORY:  This is a 50 year old female who underwent gastric bypass  surgery for morbid obesity.  I placed a Bard G2X inferior vena cava  filter prior to her operation.  This was done on February 2.  The  patient has a history of DVT as well as pulmonary emboli.  This has been  potentially attributed to taking Depo-Provera.  She remains on Coumadin.  She has come through her bypass surgery without difficulty.  She has  lost a significant amount of weight.  She is scheduled to get an  endometrial ablation on this coming Friday, and for that reason she is  off of her Coumadin.   PHYSICAL EXAMINATION:  Her blood pressure is 152/83, her pulse is 120.  General:  She is well-appearing, in no distress.  Cardiovascular:  Increased rate, regular rhythm.  Respirations are nonlabored.Marland Kitchen   DIAGNOSTIC STUDIES:  Duplex ultrasound was performed today.  There is no  evidence of acute deep vein thrombosis.   ASSESSMENT/PLAN:  History of deep vein thrombosis, preoperative inferior  vena cava filter.   PLAN:  I think since the patient is off her Coumadin for an endometrial  ablation this Friday, this is an ideal time to remove her filter.  I  have scheduled her to have this taken out on Tuesday, March 23.  I  discussed the risks and benefits with the patient, including risk of not  being able to retrieve the filter; however, this is unlikely.  She will  stay off her Coumadin and restart it after the filter has been removed.   Jorge Ny, MD  Electronically Signed   VWB/MEDQ  D:  07/02/2008  T:  07/04/2008  Job:  [id number]   cc:   [name], M.D.  Charlaine Dalton. Sherene Sires, MD, [facility]  Verdia Kuba

## 2010-09-02 NOTE — Discharge Summary (Signed)
NAMEHARVEEN, FLESCH               ACCOUNT NO.:  [id number]   MEDICAL RECORD NO.:  [id number]          PATIENT TYPE:  INP   LOCATION:  1423                         [facility]   PHYSICIAN:  Ladell Pier, M.D.   DATE OF BIRTH:  01-13-1961   DATE OF ADMISSION:  08/08/2007  DATE OF DISCHARGE:                               DISCHARGE SUMMARY   ADDENDUM:  This is an addendum to the dictated discharge summary done by  Dr. Toniann Fail These are for the events of the day of the discharge.  The dictated discharge summary will include the date from April 20 to  April 28.   PROBLEM #1 - PULMONARY EMBOLISM WITH HISTORY OF PULMONARY EMBOLUS AND  DEEP VENOUS THROMBOSIS.  Last episode was in February 2006.  The patient  will be discharged on Coumadin and her INR the day of discharge was 3.2.  She will be discharged on Coumadin 10 mg daily.  She was told to hold  her Coumadin today and then continue 10 mg on Thursday and then follow  up with Dr. Renae Gloss on Friday for INR check.  She will also continue  with the Megace.  I discussed with Dr. Marny Lowenstein and the suggestion is to  continue with the Megace for her menorrhagia while she is on the  Coumadin and she will follow up outpatient with Dr. Renae Gloss and she will  also need to follow up with Hematology.  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  She was treated with nystatin  swish and swallow and she was also treated with Diflucan and her  symptoms have resolved.   PROBLEM #3 - SINUSITIS:  The patient complained of postnasal drainage  and sinus tenderness.  She was placed on Avelox.  She will complete a  total of a 7-day course of the medication.  She will follow up with Dr.  Renae Gloss.   PROBLEM #4 - MORBID OBESITY:  Patient is morbidly  obese.  I discussed  diet and exercise with her.  I also gave her card to follow up in the  office, outpatient.   PROBLEM #5 - NEW-ONSET DIABETES:  The patient has new-onset diabetes;  presently in the hospital, she is on multiple medications including  insulin and Lantus and sliding scale, Glucotrol and metformin and blood  sugars are still in the 300s.  She will be discharged home to follow up  with the diabetes educator and she will also be discharged on the  current regimen she is on.  She will follow up with her primary care  physician as well regarding her diabetes management.   LABORATORY DATA:  On the day of discharge, PT 34.1, INR 3.2; sodium 135,  potassium 4.1, chloride 107, CO2 of 20, glucose 347, BUN 16, creatinine  1.38; WBC 6.0, hemoglobin 12.8, platelets 103,000.   IMAGING STUDY:  V/Q scan  shows high probability ventilation-perfusion  scan for pulmonary embolism.   DISCHARGE MEDICATIONS:  1. Avelox 400 mg daily for 5 days.  2. Coumadin 10 mg daily, dose to be adjusted per PCP.  3. Glucotrol XL 10 mg twice a day.  4. Lantus 90 units at bedtime, 40 units in the morning.  5. NovoLog 4 units with each meal 3 times daily.  6. Avapro 300 mg daily.  7. Maxzide 37.5/25 daily.  8. Nexium 40 mg daily.  9. Flexeril 10 mg as needed.  10.Ketoprofen 75 mg daily.  11.Megestrol at home dose.  12.Singulair 10 mg daily.  13.Advair b.i.d.  14.Xopenex inhaler as needed.      Ladell Pier, M.D.  Electronically Signed     NJ/MEDQ  D:  08/17/2007  T:  08/17/2007  Job:  [id number]   cc:   Merlene Laughter. Renae Gloss, M.D.  Fax: [phone]   Almedia Balls. Randell Patient, M.D.  Fax: [phone]

## 2010-09-02 NOTE — Op Note (Signed)
[name], [name]               ACCOUNT NO.:  [id number]   MEDICAL RECORD NO.:  [id number]          PATIENT TYPE:  AMB   LOCATION:  SDS                          FACILITY:  [facility]   PHYSICIAN:  Juleen China IV, MDDATE OF BIRTH:  Mar 30, 1961   DATE OF PROCEDURE:  05/22/2008  DATE OF DISCHARGE:  05/22/2008                               OPERATIVE REPORT   PREOPERATIVE DIAGNOSIS:  Preoperative, gastric bypass.   POSTOPERATIVE DIAGNOSIS:  Preoperative, gastric bypass.   PROCEDURES PERFORMED:  1. Ultrasound-guided access, right common femoral vein.  2. Catheter in inferior vena cava.  3. Inferior vena cavogram.  4. Insertion of inferior vena cava filter (Bard G2X).   INDICATIONS:  This is a 50 year old female with history of DVT, who has  been maintained on Coumadin for anticoagulation.  She is scheduled to  undergo gastric bypass surgery and will require a filter prior to her  operation.  Risks and benefits are discussed and informed consent was  signed.   PROCEDURE:  The patient was identified in the holding and taken to room  #8.  She was placed supine on the table.  The right groin was prepped  and draped in the standard sterile fashion.  A time-out was called.  The  right femoral vein was evaluated with ultrasound and found to be widely  patent and easily compressible.  Lidocaine 1% was used for local  anesthesia.  The right common femoral vein was accessed under ultrasound  guidance with an 18-gauge needle.  The 0.035 Bentson wire was advanced  in a retrograde fashion into the inferior vena cava under fluoroscopic  visualization.  A marked pigtail catheter was then advanced to the vena  cava bifurcation under fluoroscopic visualization.  An IVC venogram was  performed.  This revealed location of the renal veins.  The diameter of  the inferior vena cava was appropriate.  There were no aberrant renal  veins.  There was no IVC thrombus.  Over a Rosen wire, the pigtail  catheter was removed.  The filter introducing sheath was then inserted.  A Bard G2X filter was advanced through the sheath and successfully  deployed.  The tip of the filter was placed at the level of the top of  the L2 vertebral body.  The filter was centered appropriately.  Next,  the catheter and the introducing sheath was removed.  Manual pressure  was held until hemostasis was achieved.  There were no complications.   IMPRESSION:  1. Successful placement of inferior vena cava filter (Bard G2X).  2. Normal inferior vena cavogram.           ______________________________  V. Charlena Cross, MD  Electronically Signed     VWB/MEDQ  D:  05/22/2008  T:  05/22/2008  Job:  [id number]   cc:   [name], M.D.  Charlaine Dalton. Sherene Sires, MD, [facility]  Verdia Kuba

## 2010-09-02 NOTE — Op Note (Signed)
NAME:  [name], [name]               ACCOUNT NO.:  [id number]   MEDICAL RECORD NO.:  [id number]          PATIENT TYPE:  OIB   LOCATION:  9309                          FACILITY:  [facility]   PHYSICIAN:  Maxie Better, M.D.DATE OF BIRTH:  14-Feb-1961   DATE OF PROCEDURE:  08/23/2008  DATE OF DISCHARGE:                               OPERATIVE REPORT   PREOPERATIVE DIAGNOSES:  Dysfunctional uterine bleeding/menorrhagia,  morbid obesity, history of pulmonary embolism and deep vein thrombosis,  anticoagulated.   POSTOPERATIVE DIAGNOSES:  Dysfunctional uterine bleeding/menorrhagia,  morbid obesity, history of pulmonary embolism and deep vein thrombosis,  anticoagulated, uterine perforation.   POSTPROCEDURE:  Diagnostic hysteroscopy, attempted NovaSure ablation.   ANESTHESIA:  General.   SURGEON:  Maxie Better, MD   ASSISTANT:  None.   PROCEDURE IN DETAIL:  Under adequate general anesthesia/LMA, the patient  was placed in the dorsal lithotomy position.  She was sterilely prepped  and draped in the usual fashion.  The bladder was catheterized with a  small amount of urine.  The patient had a very tight introitus and a  short vaginal length.  The introitus would not accept comfortably a  Graves speculum.  A Pedersen's speculum was then utilized, small.  The  vagina was atrophic.  The anterior lip of the cervix was grasped with a  single-tooth tenaculum.  Lidocaine 1% was injected paracervically.  The  cervix was serially dilated up to #19 [facility] dilator.  A sorbitol prime,  diagnostic hysteroscope was introduced into the uterine cavity.  Both  tubal ostia were seen, but they were sclerosed.  The uterine cavity was  without any evidence of polyps or fibroids and at that point, the  hysteroscope was removed.  The cavity was sounded to 8 cm.  The  endocervical canal sounded to 3 cm with a cavity length of 5 cm.  The  cervix was then started to be dilated with a Pratt dilator, would get  up  to #25.  However, the NovaSure apparatus thereafter would not traverse  the internal os.  The patient had been premedicated with Cytotec to  facilitate the dilatation due to her nulliparity.  The several attempts  at traversing the internal os were unsuccessful.  The cervix then began  to contract around the endocervical canal and several attempts again was  made to traverse the internal os.  At that point, the hysteroscope was  then reinserted.  The cavity direction was noted.  The uterus was again  inspected and the internal os was deviated to the left of the patient.  Decision was then made to do one last try, waited 5 minutes for the  cervix to then relax.  Intravenous Toradol had been given, we waited and  the cervix was again easily dilated up to then 27 Pratt dilator.  The 27  Pratt dilator was then left in the cervical canal to help with the  dilatation further, that was then removed.  The NovaSure apparatus then  was able to traverse the cavity and the cervical width was then 3.1.  The fail-safe mechanism was then put in place,  particularly the  assessment of the uterine cavity itself.  When that finally came out of  the red zone, the mechanism then checked the integrity of the cavity,  would not deploy and so at that point, the cavity length was adjusted  from 5 to 4.5 to then subsequently 4, and again the mechanism would not  deploy.  At that point, the decision was then made to remove the  NovaSure and to check for any evidence of perforation.  The hysteroscope  was then inserted and a small fundal perforation was noted.  At that  point, obviously the procedure could not be continued further.  There  was no evidence of bowel in the perforation site.  There was no bleeding  into the uterine cavity from the area of perforation and a decision was  then made not to perform a laparotomy or laparoscopy for this patient  given the likelihood that this would heal on its own.  The  posterior lip  of the cervix which had been grasped in the interim to help try to  facilitate to traverse the internal os.  The tenaculum site was bleeding  and a suture was just placed of 3-0 chromic suture x1.  All instruments  were then removed from the vagina.  Specimen was none.  Estimated blood  loss was minimal, and it appears from the vagina and the tenaculum site.  Complication was then uterine perforation from the NovaSure apparatus.  The patient otherwise tolerated the procedure well, was transferred to  the recovery room in stable condition and will placed on extended  outpatient observation due to her extensive medical history and uterine  perforation.       Maxie Better, M.D.  Electronically Signed     [place]/MEDQ  D:  08/23/2008  T:  08/24/2008  Job:  [id number]

## 2010-09-02 NOTE — Discharge Summary (Signed)
[name], [name]               ACCOUNT NO.:  [id number]   MEDICAL RECORD NO.:  [id number]          PATIENT TYPE:  INP   LOCATION:  1423                         FACILITY:  [facility]   PHYSICIAN:  Eduard Clos, MDDATE OF BIRTH:  11-Nov-1960   DATE OF ADMISSION:  08/08/2007  DATE OF DISCHARGE:  08/17/2007                               DISCHARGE SUMMARY   ADDENDUM:  This is an addendum discharge summary dictated for the course  during August 11, 2007 through August 16, 2007.   This is a 50 year old female who was diagnosed with a pulmonary embolism  with a V/Q scan showing high probability.  She was started on Lovenox  and Coumadin at that time.  During her stay the patient's Coumadin  became therapeutic and Lovenox was discontinued.  The patient was also  found to be hyperglycemic with new onset diabetes mellitus.  The patient  was placed on glipizide and metformin.  We started her on Lantus, which  had to be increased regularly because of persistently elevated blood  sugars of more than 350.  At this time the patient is on 90 units of  Lantus at bedtime with 40 in the morning along with a scheduled dose of  NovoLog insulin before meals with sliding scale.  The patient also  developed some oral thrush with a fungal UTI for which fluconazole and  oral nystatin were started.  The patient was also complaining of a sore  throat for which Avelox was started.  At this time the patient has been  advised that she will need Coumadin lifelong and her diabetic  medications are still be adjusted due to persistently high blood sugars.  Once her blood sugars are more well-controlled discharge will be  considered once she is stable.   DISCHARGE DIAGNOSES:  1. Pulmonary embolism.  2. Newly diagnosed diabetes, uncontrolled, Type 2.  3. Bronchial asthma.  4. Oral thrush.  5. Fungal urinary tract infection.  6. Sore throat.  7. Morbid obesity.   PLAN:  1. Once the patient's blood sugar is more  under control and the      patient is stable discharge will be considered.  2. At the time of discharge the discharge medications will be      dictated.      Eduard Clos, MD  Electronically Signed     ANK/MEDQ  D:  08/18/2007  T:  08/18/2007  Job:  [id number]

## 2010-09-02 NOTE — Op Note (Signed)
[name], [name]               ACCOUNT NO.:  [id number]   MEDICAL RECORD NO.:  [id number]          PATIENT TYPE:  AMB   LOCATION:  SDS                          FACILITY:  [facility]   PHYSICIAN:  Juleen China IV, MDDATE OF BIRTH:  1960-12-29   DATE OF PROCEDURE:  10/02/2008  DATE OF DISCHARGE:  10/02/2008                               OPERATIVE REPORT   PREOPERATIVE DIAGNOSIS:  IVC filter.   ASSISTANT:  IVC filter.   PROCEDURES PERFORMED:  1. Catheter and IVC.  2. IVC venogram.  3. Unsuccessful attempt at removal of IVC filter.   INDICATIONS:  [name] is a 50 year old female who had a Bard G2  filter placed preoperatively for gastric bypass surgery.  She has a  history of DVT and PE for which she is on chronic anticoagulation.  Filter was placed in February.  We tried to remove this in March;  however, due to scheduling conflicts that she had, it has not been  removed until now.   The patient had a preoperative duplex, which shows no evidence of DVT.   PROCEDURE:  The patient was identified in the holding area and taken to  room 80.  She was placed supine on the table.  The right neck was  prepped and draped in standard sterile fashion.  The right internal  jugular vein was evaluated with ultrasound and found to be patent.  It  was accessed with a micropuncture needle under ultrasound guidance.  An  18 wire was advanced into the heart under fluoroscopic visualization.  A  micropuncture sheath was placed.  Next, a Bentson wire was navigated  into the inferior vena cava.  A 5-French OmniFlush catheter was advanced  down to the IVC bifurcation and IVC venogram was obtained.  There was no  thrombus visualized within the IVC filter.  At this point, the OmniFlush  catheter was removed and a 7-French sheath was advanced down to the tip  of the filter.  The filter was noted to be tilted with the tip embedded  in the IVC wall.  I initially used a snare to try to grasp the hook;  however, was unable to get access to the hook given that it was embedded  in the IVC wall.  I then placed a Rosen wire through the filter leg then  used to both an 8-mm Fox balloon and a 12 x 4 Atlas balloon to try to  free the balloon at the tip of the filter from the IVC wall.  Both of  these attempts were unsuccessful.  I then used alligator forceps from  the endoscopy suite to try to dislodge the filter from the IVC wall.  After attempting this for approximately of 1-1/2 hours, I was  unsuccessful in attempting to remove this filter.  With this I elected  to terminate the procedure.  The sheath was removed.  Manual pressure  was held until hemostasis was achieved.  The patient was taken to the  holding area for recovery.   IMPRESSION:  1. No evidence of thrombus within the IVC filter.  2. Unsuccessful  removal of IVC filter due to tilting of the filter      which placed the hook embedded within the inferior vena cava wall.      Jorge Ny, MD  Electronically Signed     VWB/MEDQ  D:  10/03/2008  T:  10/03/2008  Job:  [id number]

## 2010-09-02 NOTE — Procedures (Signed)
DUPLEX DEEP VENOUS EXAM - LOWER EXTREMITY   INDICATION:  History of DVT, PE.   HISTORY:  Edema:  No.  Trauma/Surgery:  Patient had recent gastric bypass surgery.  Pain:  No.  PE:  Patient had pulmonary emboli in April, 2009 and with DVT in 2006.  Previous DVT:  Left calf DVT in 2006.  Anticoagulants:  Patient was on Coumadin until recently when she  switched to Lovenox.  She was switched secondary to an upcoming surgery.  Other:  Vena cava filter placed 05/22/08.   DUPLEX EXAM:                CFV   SFV   PopV  PTV    GSV                R  L  R  L  R  L  R   L  R  L  Thrombosis    o  o  o  o  o  p  o   o  o  o  Spontaneous   +  +  +  +  +  +  +   +  +  +  Phasic        +  +  +  +  +  +  +   +  +  +  Augmentation  +  +  +  +  +  +  +   +  +  +  Compressible  +  +  +  +  +  +  +   +  +  +  Competent     +  +  +  +  +  o  +   +  +  +   Legend:  + - yes  o - no  p - partial  D - decreased   IMPRESSION:  1. Difficult examination secondary to body habitus.  2. No evidence of acute deep venous thrombosis.  3. The left popliteal vein walls are thickened, and the left popliteal      vein is incompetent, consistent with chronic deep venous      thrombosis.        _____________________________  V. Charlena Cross, MD   MC/MEDQ  D:  07/02/2008  T:  07/02/2008  Job:  [id number]

## 2010-09-02 NOTE — Assessment & Plan Note (Signed)
OFFICE VISIT   [name], [name]  DOB:  22-Apr-1960                                       02/16/2008  KGMWN#:[id number]   REASON FOR VISIT:  Evaluate for IVC filter.   HISTORY:  This is a 50 year old female that I am seeing at the request  of Dr. Parke Simmers for evaluation of IVC filter.  The patient has a history of  morbid obesity and is scheduled to undergo gastric bypass surgery.  She  also has a history of DVT as well as pulmonary emboli in the past.  Her  first pulmonary emboli was in 2006.  At that time she was taking Depo-  Provera.  She has been managed on Coumadin.  She also had a second PE in  April of this past year.  She continues to be on Coumadin.  There is no  family history of clotting disorder.   REVIEW OF SYSTEMS:  GENERAL:  Negative for fevers, chills, weight gain  or weight loss.  The patient weighs 410 pounds.  CARDIAC:  Is positive for shortness of breath with exertion.  PULMONARY:  Positive for asthma.  GI:  Positive for reflux.  GU:  Negative.  VASCULAR:  History of blood clots.  NEURO:  Negative.  ORTHO:  Positive for arthritis.  PSYCHIATRIC:  Negative.  HEENT:  Recent change in eyesight.  HEME:  Positive for clotting disorders.   PAST MEDICAL HISTORY:  Is positive for diabetes, hypertension, pulmonary  embolism.   FAMILY HISTORY:  Negative for cardiovascular disease at an early age.   SOCIAL HISTORY:  She is single.  She works as an Chief Executive Officer.  Does not smoke.  Has not smoked.  Does not drink alcohol.   MEDICATIONS:  Include Advair, Nexium, Alphagan, Azopt, Xalatan, Maxzide,  Avapro, Flexeril, Qualaquin, megestrol, ketoprofen, Coumadin, glipizide,  metformin, NovoLog, Lantus, Celebrex, Byetta, diltiazem.   ALLERGIES:  Include beta-blockers, penicillin, thimerosal.   PHYSICAL EXAMINATION:  Vital signs:  Blood pressure 148/80, pulse is  112, temperature is 98.2.  General:  She is well-appearing and in  no  acute distress.  Cardiovascular:  Regular rate and rhythm.  Abdomen:  Morbidly obese.   ASSESSMENT:  Preop for gastric bypass surgery, history of PE (pulmonary  embolus).   PLAN:  The patient once she gets her date set up for her gastric bypass  surgery I would be happy to place a removable IVC filter.  She will need  to stop her Coumadin 5 days prior.  We discussed the risk of caval  thrombosis with filter placement although it is very uncommon.  We also  discussed the possibility that this could become a permanent filter.  Again, she will call to schedule her appointment once her gastric bypass  surgery has been scheduled.   Jorge Ny, MD  Electronically Signed   VWB/MEDQ  D:  02/16/2008  T:  02/17/2008  Job:  [id number]   cc:   [name], M.D.  Charlaine Dalton. Sherene Sires, MD, [facility]  Verdia Kuba

## 2010-09-02 NOTE — H&P (Signed)
[name], DOWN               ACCOUNT NO.:  [id number]   MEDICAL RECORD NO.:  [id number]          PATIENT TYPE:  AMB   LOCATION:  SDC                           FACILITY:  [facility]   PHYSICIAN:  Dois Davenport A. Rivard, M.D. DATE OF BIRTH:  12-04-1960   DATE OF ADMISSION:  07/06/2008  DATE OF DISCHARGE:                              HISTORY & PHYSICAL   REASON FOR ADMISSION:  Dysfunctional uterine bleeding.   HISTORY OF PRESENT ILLNESS:  This is a 50 year old single African  American female, gravida 0, who presents to our office June 07, 2008  to review the possibility of endometrial ablation.  [name] has  recently had bariatric surgery on May 23, 2008 with Dr. Reinaldo Raddle at  [facility] for treatment of severe morbid obesity with multiple  medical comorbidities.  Ms. Sarr has been treated with high doses of  Megace by Dr. Randell Patient since 1999 due to dysfunctional uterine bleeding.  She is currently using Megace 80 mg daily.  Upon recommendation of Dr.  Reinaldo Raddle, she wishes to discontinue Megace which is an appetite stimulator  in her quest for weight loss after bariatric surgery.  Prior to her  treatment, she reports menstrual cycles every 30-40 days, lasting 5 days  with 3 very heavy days, requiring 1 pad an hour, and passing multiple  clots.  She has tried on a few occasions to reduce the Megace dose with  subsequent dysfunctional uterine bleeding.  Around her recent surgery,  she also discontinued Megace which triggered again an episode of  bleeding.   Of significant importance, [name] is currently using Coumadin  anticoagulant therapy for two previous episodes of pulmonary emboli, the  last one in April 2009.  For this reason, she has to remain  anticoagulated for life.  Prior to her bariatric surgery, she had a vena  cava filter placed in February 2010.  So she could be off her Coumadin  for her surgery.   She is now seeking endometrial ablation to discontinue her Megace,  understanding that endometrial ablation does not necessarily lead to  amenorrhea.   PAST MEDICAL HISTORY:  Previous hysteroscopy for dysfunctional uterine  bleeding by Dr. Marline Backbone in March 2002, with benign endometrium.  Bariatric surgery in February 2010, placement of vena cava filter in  February 2010, second episode of pulmonary emboli in April 2009, chronic  hypertension, glaucoma, asthma, gastroesophageal reflux disease, morbid  obesity, gravida 0.   CURRENT MEDICATIONS:  1. Advair 1 inhalation daily.  2. Nexium 40 mg a daily.  3. Alphagan opthalmic solution 15%, 1 drop in each eye, 3 times a day.  4. Azopt 1%, 1 drop in each eye, 3 times a day.  5. Lumigan ophthalmic solution 3%, 1 drop in each eye twice a day.  6. Avapro 150 mg daily.  7. Flexeril 10 mg daily.  8. Qualaquin 324 mg daily.  9. Megace 80 mg daily.  10.Coumadin 5 mg daily.  11.Oxycodone p.r.n.  12.Cardizem CD 240 mg daily.  13.Promethazine 12.5 mg p.r.n.  14.Lortab 5 mg daily.  15.Xopenex one vial 4 times a day  p.r.n.  16.Rozerem 8 mg at bedtime.  17.Clarinex 5 mg daily p.r.n.  18.Astelin nasal spray 2 squirts twice a day.  19.Nasacort AQ 2 squirts daily.   MEDICAL ALLERGIES:  BETA-BLOCKER, PENICILLIN, and THIMEROSAL.   FAMILY HISTORY:  Negative for feminine or colon cancer.  Positive for  chronic hypertension, diabetes, and history of stroke.   SOCIAL HISTORY:  Single, nonsmoker, gravida 0, works as an Microbiologist.   REVIEW OF SYSTEMS:  Noncontributory.   PHYSICAL EXAMINATION:  VITAL SIGNS:  Current weight 376 and 1/2 pound  for height of 5 feet 5 inches with a weight loss since her bariatric  surgery of 34 pounds.  Blood pressure 120/80 and pulse 120 beats per  minute.  HEAD, EYES, EARS, NOSE AND THROAT:  Negative.  Thyroid not enlarged.  CARDIOVASCULAR:  No regular rate and rhythm.  CHEST:  Clear, although decreased breath sounds on both sides due to  morbid obesity.  BACK:  No  CVA tenderness.  ABDOMEN:  With a significant pannus.  EXTREMITIES:  No clubbing or cyanosis.  Minimal edema.  NEUROLOGIC:  Within normal limits.  GYN:  Deferred.   For a 3-D transvaginal ultrasound which revealed anteverted uterus with  an intramural fibroid measuring 1.2 cm.  The right ovary appeared normal  in size and shape.  The left ovary appeared normal in size and shape.  The endometrial thickness was 0.8 cm with an endometrial mass measuring  1.2 x 0.8 cm.   Preoperative consultation records were received by Dr. Jacinto Halim at  [facility] with a recent cardiology workup prior  to bariatric surgery revealing a normal echocardiogram and EKG.  Dr.  Jacinto Halim has cleared the patient for the  planned procedure.   Telephone conversation with Dr. Arbutus Ped at [facility]ematology/Oncology  with recommendations for Lovenox coverage perioperatively as follows.  Discontinue Coumadin 4-5 days prior to surgery and start Lovenox at 1.5  mg/kg daily, do not use Lovenox or Coumadin on the day before and the  day of surgery.  Restart Lovenox postoperatively same day at 1.5 mg/kg  and Coumadin 5 mg daily for 5 days, then continue Coumadin only.   </ASSESSMENT/PLAN>  Dysfunctional uterine bleeding well controlled with high doses of Megace  in a patient status post bariatric surgery and quest for significant  weight loss, who desires to discontinue Megace.  For this reason, we are  proposing hysteroscopy, dilatation and curettage, and endometrial  ablation with NovaSure.   All precautions have been taken in considering the significant  comorbidities of the patient.  The procedure has been thoroughly  reviewed with the patient including risks and benefits.  Risks include  and are not limited to risk of  anesthesia, bleeding, infection, and failure of treatment.  The patient  understands and is agreeable to proceed.  She was given a prescription  of Cytotec 200 mcg per vagina the  night prior to this surgery and insert  a second tablet the morning of the surgery to facilitate cervical  dilatation.       Crist Fat Rivard, M.D.  Electronically Signed     SAR/MEDQ  D:  06/25/2008  T:  06/26/2008  Job:  [id number]

## 2010-09-02 NOTE — H&P (Signed)
[name], [name] NO.:  [id number]   MEDICAL RECORD NO.:  [id number]          PATIENT TYPE:  INP   LOCATION:  0102                         FACILITY:  [facility]   PHYSICIAN:  Lucita Ferrara, MD         DATE OF BIRTH:  [birth year]   DATE OF ADMISSION:  08/08/2007  DATE OF DISCHARGE:                              HISTORY & PHYSICAL   PRIMARY CARE PHYSICIAN:  The patient's primary care physician is  Merlene Laughter. Renae Gloss, M.D.   OBSTETRICIAN/GYNECOLOGIST:  Janine Limbo, M.D.   HISTORY OF THE PRESENT ILLNESS:  The patient is a 50 year old morbidly  obese female who presents to the [facility] with a chief  complaint of shortness of breath.  Her shortness of breath has been  ongoing now for 4 days.  The patient is a known asthmatic and has been  using her nebulizer machine at home.  Her symptoms have been getting  progressively worse despite using metered dose inhaler and nebulizer  machine.  The patient also uses Advair.  The patient was also given  treatments today when she presented to Urgent Care with no relief.  Her  shortness of breath is accompanied by pleuritic type chest pain that is  worse with exertion.  She denies any fevers or chills.  She also has a  cough that is associated with no productive sputum.  The patient also  states that she has right lower extremity pain.  She denies recent  travel.  Of note, at the Urgent Care Center the patient was still short  of breath with pleuritic-type chest pain right after the nebulizer  treatment.  She denies smoking.  She also denies recent long travel.  Currently her pulse oximetry  dropped down to 86 upon moderate  ambulation.   REVIEW OF SYSTEMS:  The patient does have a past medical history  significant for pulmonary embolism.  She is status post 6 months of  Coumadin therapy for the first pulmonary embolism; otherwise, her review  of systems is negative.  She denies any neurological changes.   Again,  she has lower extremity pain, but no focal swelling or erythema.   ALLERGIES:  The patient has an allergy to PENICILLIN.   PAST MEDICAL HISTORY:  The past medical history is significant for:  1. Asthma.  2. Hypertension.  3. Pulmonary embolism.  4. 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  5. Morbid obesity.  6. Sleep apnea, currently not on CPAP.  7. New-onset diabetes.  8. Degenerative joint disease.   PAST SURGICAL HISTORY:  1. Status post dilatation and curettage.  2. Arthroscopic knee procedure.   SOCIAL HISTORY:  The patient denies alcohol, drugs or  tobacco.  Currently the patient is taking birth control.   MEDICATIONS:  For her medications at home the doses are unknown and she  does not have the complete list with her.  She is taking:  1. Advair Diskus.  2. Avapro.  3. Maxzide.  4. Nexium.  5. Cipro.  6. Flexeril.  7. Ketoprofen.  8. Magnasil.  9. Mobic.  10.Solaquin.  11.Singulair.  12.Xanax.  Again, doses are unknown.   PHYSICAL EXAMINATION:  VITAL SIGNS:  On physical examination the  patient's blood pressure is 154/94, pulse rate 119, respirations 20 and  temperature 97.7.  GENERAL APPEARANCE:  Generally speaking the patient  is a morbidly obese African-American female.  She has obvious  shortness  of breath, but is in no distress.  HEAD AND EYES:  Normocephalic and atraumatic.  Sclerae anicteric.  NECK AND THROAT:  The neck is supple.  No JVD.  No carotid bruits.  __________  intact.  Mucous membranes are moist.  No thyromegaly.  HEART:  Cardiovascularly - S1 and S2, tachy, with no murmurs, rubs or  clicks.  LUNGS:  The lungs are clear to auscultation bilaterally.  No rhonchi,  rales or wheezes.  CHEST:  The chest has no focal tenderness.  ABDOMEN:  The abdomen is soft, nontender and nondistended.  Positive  bowel sounds.  Obese.  EXTREMITIES:  The extremities show no clubbing, cyanosis or edema.  Right lower extremity calf  tenderness is positive with no Homans' sign.  NEUROLOGIC EXAMINATION:  Neuro - the patient is oriented x3.  Cranial  nerves II-XII are grossly intact.   LABORATORY DATA:  Sodium 143, potassium 3.4, chloride 109, BUN 5, and  creatinine 1.22.  White count 7.8, hemoglobin 13.7, hematocrit 40.8 with  neutrophils 65, and platelets 82,000.  D-dimer greater than 20.  CT  angio showed a nondiagnostic exam for evaluation of pulmonary embolus.  Of note, I had a discussion with the radiologist who states that due the  patient's size and artifact limited the study and that he does not see a  central or saddle embolus, but he cannot rule out a pulmonary embolism  that is peripheral.   ASSESSMENT/PLAN:  1. The patient 50 year old with shortness of breath, pleuritic-type      chest pain, tachycardia and elevated D-dimer with a history of deep      venous thrombosis and pulmonary embolus, which clinically are all      consistent with pulmonary embolism.  We will go ahead and admit the      patient to the medical telemetry unit.  We will start supplemental      oxygen to keep the pulse oximeter above 93%, start Lovenox and      Coumadin per pharmacy protocol for a goal INR between 2 and 3.  We      will send off a hypercoagulable panel as this is the second episode      for deep venous thrombosis and pulmonary embolus per radiology      recommendations.  We will go ahead and hydrate the patient,      premedicate her and we may consider repeating a computerized      tomographic angiogram in the morning.  Note, that radiology states      that the V/Q scan is also nondiagnostic.  The patient is currently      hemodynamically stable.  There is no evidence of a saddle or      central embolus.  If there are any hemodynamic changes we will need      to get a two-dimensional echocardiogram to rule out right      ventricular strain.  The patient will likely need lifetime Coumadin      as this is her second  pulmonary embolus.  2. Asthma.  We will continue Xopenex nebulizer treatments, oxygen,  Advair and Singulair.  3. Hypertension, currently controlled; continue Avapro.  4. I am concerned about her increased blood sugars.  She officially      does not have a diagnosis of diabetes; blood sugar today was 334.      We will get a hemoglobin A-1-C, start sliding scale insulin and      diabetes education.  5. Osteoarthritis.  Tylenol will be continued and also Flexeril and      Mobic per her home regimen.  6. Sleep apnea- continue CPAP- 14 mmgh QHS.  7. Deep venous thrombosis prophylaxis and treatment as above; and,      gastrointestinal prophylaxis with Protonix.   I have explained the plans and procedures of this admission to the  patient; and,  the patient understands.      Lucita Ferrara, MD  Electronically Signed     RR/MEDQ  D:  08/08/2007  T:  08/09/2007  Job:  [id number]

## 2010-09-05 NOTE — Consult Note (Signed)
NAMEDANIELE, DILLOW NO.:  [id number]   MEDICAL RECORD NO.:  [id number]          PATIENT TYPE:  INP   LOCATION:  0353                         FACILITY:  [facility]   PHYSICIAN:  Janine Limbo, M.D.DATE OF BIRTH:  June 20, 1960   DATE OF CONSULTATION:  05/22/2004  DATE OF DISCHARGE:                                   CONSULTATION   HISTORY OF PRESENT ILLNESS:  [name] is a 50 year old female, G0 who was  admitted on May 19, 2004, with deep venous thrombosis in her left leg.  She was also found to have a pulmonary embolus.  The patient had a history  of dysmenorrhea and menorrhagia that was well-controlled as outpatient on  Depo-Provera and Premarin.  Please see previous dictated consultation notes  for greater details.   The patient was admitted and started on anticoagulation therapy.  We had  discussed medical and surgical management options and at this point, we are  trying to determine the best option for [name].   PHYSICAL EXAMINATION:  VITAL SIGNS:  Weight 400 pounds.  CHEST:  Clear.  HEART:  Regular rate and rhythm.  EXTREMITIES:  Nontender today, although there is some stasis changes.   LABORATORY DATA AND X-RAY FINDINGS:  PTT 14.4 today, INR 1.2.   ASSESSMENT:  1.  Deep venous thrombosis with a pulmonary embolus.  2.  History of menorrhagia and dysmenorrhea previously treated with Premarin      and Depo-Provera.  3.  Marked obesity (weight 400 pounds).   RECOMMENDATIONS:  The patient and I have discussed the surgical and medical  options for her current condition.  The patient understands that all of her  options are limited.  There is some conflicting information about the safety  of Depo-Lupron in the face of a deep venous thrombosis with active  anticoagulation.  The 2006 version of the Physician's Desk Reference does  not list Depo-Lupron as a contraindication in this situation.  It is  interesting to note that when discussing the 11.25  mg dose of Depo-Lupron  and the 3.75 mg dose of Depo-Lupron, it is listed that Norethindrone is  contraindicated in the case of deep venous thrombosis.  This is obviously a  misprint.  The question becomes whether Depo-Lupron is contraindicated and  Norethindrone was simply substituted for Depo-Lupron, or whether or not for  some reason the heading of Norethindrone with the contraindications were  mistakenly misprinted under the medication Depo-Lupron.  Apparently, the  pharmacy has information, however, that states that Depo-Lupron is  contraindicated.   Given the current situation, ultimately the patient will have to decided if  she is willing to accept the risk of Depo-Lupron during a condition of  active anticoagulation with the hopes of not having any vaginal bleeding.  Her other option is to accept the risk that she may very well have bleeding  and at that time have her surgeons attempt to do surgery in the face of  anticoagulation on Coumadin therapy.   I do not know that there is any good solution to this dilemma, but I am more  than willing to support the patient in whatever she feels is most  appropriate for her.  We discussed all of these options and the patient  informs me that she will let us know how she wants to proceed after having  some time to contemplate the information.      AVS/MEDQ  D:  05/22/2004  T:  05/22/2004  Job:  [id number]   cc:   [name], M.D.   [name]. [name], M.D.  [address]  [address]  [place]  [place] [address]  Fax: [phone]

## 2010-09-08 ENCOUNTER — Other Ambulatory Visit: Payer: Self-pay | Admitting: Chiropractic Medicine

## 2010-09-08 ENCOUNTER — Ambulatory Visit
Admission: RE | Admit: 2010-09-08 | Discharge: 2010-09-08 | Disposition: A | Discharge status: Home / Self Care | Payer: BC Managed Care – PPO | Source: Ambulatory Visit | Attending: Chiropractic Medicine | Admitting: Chiropractic Medicine

## 2010-09-08 DIAGNOSIS — M545 Low back pain, unspecified: Secondary | ICD-10-CM

## 2010-09-08 DIAGNOSIS — M5416 Radiculopathy, lumbar region: Secondary | ICD-10-CM

## 2010-09-08 DIAGNOSIS — M5412 Radiculopathy, cervical region: Secondary | ICD-10-CM

## 2011-01-13 LAB — LIPID PANEL
Cholesterol: 164
HDL: 28 — ABNORMAL LOW
LDL Cholesterol: 104 — ABNORMAL HIGH
Total CHOL/HDL Ratio: 5.9
Triglycerides: 161 — ABNORMAL HIGH
VLDL: 32

## 2011-01-13 LAB — CARDIOLIPIN ANTIBODIES, IGG, IGM, IGA
Anticardiolipin IgA: 20 (ref <13)
Anticardiolipin IgG: <7 — ABNORMAL LOW (ref <11)
Anticardiolipin IgM: <7 — ABNORMAL LOW (ref <10)

## 2011-01-13 LAB — COMPREHENSIVE METABOLIC PANEL
ALT: 61 — ABNORMAL HIGH
AST: 44 — ABNORMAL HIGH
Albumin: 3 — ABNORMAL LOW
Alkaline Phosphatase: 45
BUN: 4 — ABNORMAL LOW
CO2: 23
Calcium: 8.7
Chloride: 109
Creatinine, Ser: 1.09
GFR calc Af Amer: >60
GFR calc non Af Amer: 54 — ABNORMAL LOW
Glucose, Bld: 252 — ABNORMAL HIGH
Potassium: 3.6
Sodium: 141
Total Bilirubin: 1.1
Total Protein: 5.8 — ABNORMAL LOW

## 2011-01-13 LAB — CBC
HCT: 36.1
HCT: 37.1
HCT: 37.1
HCT: 37.4
HCT: 38.3
HCT: 38.9
HCT: 39.6
HCT: 39.8
HCT: 40.2
HCT: 40.8
Hemoglobin: 12.2
Hemoglobin: 12.3
Hemoglobin: 12.5
Hemoglobin: 12.8
Hemoglobin: 13.1
Hemoglobin: 13.1
Hemoglobin: 13.4
Hemoglobin: 13.5
Hemoglobin: 13.6
Hemoglobin: 13.7
MCHC: 33.1
MCHC: 33.4
MCHC: 33.6
MCHC: 33.7
MCHC: 33.8
MCHC: 33.8
MCHC: 33.9
MCHC: 34
MCHC: 34.1
MCHC: 34.4
MCV: 85.8
MCV: 86.1
MCV: 86.2
MCV: 86.3
MCV: 86.3
MCV: 86.5
MCV: 86.6
MCV: 86.7
MCV: 87.2
MCV: 87.7
Platelets: 103 — ABNORMAL LOW
Platelets: 65 — ABNORMAL LOW
Platelets: 75 — ABNORMAL LOW
Platelets: 76 — ABNORMAL LOW
Platelets: 82 — ABNORMAL LOW
Platelets: 83 — ABNORMAL LOW
Platelets: 86 — ABNORMAL LOW
Platelets: 87 — ABNORMAL LOW
Platelets: 95 — ABNORMAL LOW
Platelets: 99 — ABNORMAL LOW
RBC: 4.19
RBC: 4.27
RBC: 4.28
RBC: 4.32
RBC: 4.45
RBC: 4.5
RBC: 4.56
RBC: 4.6
RBC: 4.65
RBC: 4.71
RDW: 14.9
RDW: 15
RDW: 15
RDW: 15.2
RDW: 15.3
RDW: 15.3
RDW: 15.3
RDW: 15.3
RDW: 15.6 — ABNORMAL HIGH
RDW: 15.7 — ABNORMAL HIGH
WBC: 6
WBC: 6.4
WBC: 6.4
WBC: 6.5
WBC: 6.8
WBC: 6.8
WBC: 6.8
WBC: 6.9
WBC: 7.2
WBC: 7.8

## 2011-01-13 LAB — PROTEIN C ACTIVITY: Protein C Activity: 126 % (ref 75–133)

## 2011-01-13 LAB — PROTIME-INR
INR: 1.1
INR: 1.3
INR: 1.3
INR: 1.5
INR: 2.2 — ABNORMAL HIGH
INR: 2.2 — ABNORMAL HIGH
INR: 2.5 — ABNORMAL HIGH
INR: 2.8 — ABNORMAL HIGH
INR: 3.2 — ABNORMAL HIGH
Prothrombin Time: 14.5
Prothrombin Time: 16 — ABNORMAL HIGH
Prothrombin Time: 16.5 — ABNORMAL HIGH
Prothrombin Time: 18.8 — ABNORMAL HIGH
Prothrombin Time: 25.2 — ABNORMAL HIGH
Prothrombin Time: 25.5 — ABNORMAL HIGH
Prothrombin Time: 27.5 — ABNORMAL HIGH
Prothrombin Time: 30.3 — ABNORMAL HIGH
Prothrombin Time: 34.1 — ABNORMAL HIGH

## 2011-01-13 LAB — DIFFERENTIAL
Basophils Absolute: 0
Basophils Relative: 1
Eosinophils Absolute: 0.1
Eosinophils Relative: 2
Lymphocytes Relative: 23
Lymphs Abs: 1.8
Monocytes Absolute: 0.8
Monocytes Relative: 10
Neutro Abs: 5.1
Neutrophils Relative %: 65

## 2011-01-13 LAB — PROTEIN C, TOTAL: Protein C, Total: 78 % (ref 70–140)

## 2011-01-13 LAB — BASIC METABOLIC PANEL WITH GFR
BUN: 13
BUN: 14
CO2: 19
CO2: 21
Calcium: 9.1
Calcium: 9.3
Chloride: 103
Chloride: 103
Creatinine, Ser: 1.33 — ABNORMAL HIGH
Creatinine, Ser: 1.38 — ABNORMAL HIGH
GFR calc non Af Amer: 41 — ABNORMAL LOW
GFR calc non Af Amer: 43 — ABNORMAL LOW
Glucose, Bld: 317 — ABNORMAL HIGH
Glucose, Bld: 354 — ABNORMAL HIGH
Potassium: 3.8
Potassium: 3.9
Sodium: 132 — ABNORMAL LOW
Sodium: 134 — ABNORMAL LOW

## 2011-01-13 LAB — URINE CULTURE
Colony Count: 15000
Special Requests: NEGATIVE

## 2011-01-13 LAB — BASIC METABOLIC PANEL
BUN: 16
BUN: 5 — ABNORMAL LOW
BUN: 8
CO2: 20
CO2: 20
CO2: 23
Calcium: 9.2
Calcium: 9.3
Calcium: 9.6
Chloride: 107
Chloride: 107
Chloride: 109
Creatinine, Ser: 1.17
Creatinine, Ser: 1.22 — ABNORMAL HIGH
Creatinine, Ser: 1.38 — ABNORMAL HIGH
GFR calc Af Amer: 49 — ABNORMAL LOW
GFR calc Af Amer: 57 — ABNORMAL LOW
GFR calc Af Amer: 60 — ABNORMAL LOW
GFR calc non Af Amer: 41 — ABNORMAL LOW
GFR calc non Af Amer: 47 — ABNORMAL LOW
GFR calc non Af Amer: 49 — ABNORMAL LOW
Glucose, Bld: 300 — ABNORMAL HIGH
Glucose, Bld: 334 — ABNORMAL HIGH
Glucose, Bld: 347 — ABNORMAL HIGH
Potassium: 3.8
Potassium: 4.1
Potassium: 4.3
Sodium: 135
Sodium: 138
Sodium: 143

## 2011-01-13 LAB — HOMOCYSTEINE
Homocysteine: 11.3
Homocysteine: 12.2

## 2011-01-13 LAB — CARDIAC PANEL(CRET KIN+CKTOT+MB+TROPI)
CK, MB: 1.3
CK, MB: 1.3
Relative Index: 0.1
Relative Index: 0.1
Total CK: 1066 — ABNORMAL HIGH
Total CK: 1192 — ABNORMAL HIGH
Troponin I: 0.03
Troponin I: 0.04

## 2011-01-13 LAB — BLOOD GAS, ARTERIAL
Acid-base deficit: 2.3 — ABNORMAL HIGH
Bicarbonate: 19.4 — ABNORMAL LOW
Drawn by: 23588
O2 Content: 2
O2 Saturation: 96.9
Patient temperature: 98.6
TCO2: 17
pCO2 arterial: 25.9 — ABNORMAL LOW
pH, Arterial: 7.488 — ABNORMAL HIGH
pO2, Arterial: 83.6

## 2011-01-13 LAB — HEMOGLOBIN A1C
Hgb A1c MFr Bld: 10.9 — ABNORMAL HIGH
Mean Plasma Glucose: 311

## 2011-01-13 LAB — LUPUS ANTICOAGULANT PANEL
DRVVT: 50.6 — ABNORMAL HIGH (ref 36.1–47.0)
Lupus Anticoagulant: NOT DETECTED
PTT Lupus Anticoagulant: 53.3 — ABNORMAL HIGH (ref 36.3–48.8)
PTTLA 4:1 Mix: 47.9 (ref 36.3–48.8)
dRVVT Incubated 1:1 Mix: 40.8 (ref 36.1–47.0)

## 2011-01-13 LAB — TROPONIN I: Troponin I: 0.03

## 2011-01-13 LAB — CK TOTAL AND CKMB (NOT AT ARMC)
CK, MB: 1.4
Relative Index: 0.1
Total CK: 1151 — ABNORMAL HIGH

## 2011-01-13 LAB — D-DIMER, QUANTITATIVE: D-Dimer, Quant: >20 — ABNORMAL HIGH

## 2011-01-13 LAB — GLUCOSE, RANDOM: Glucose, Bld: 375 — ABNORMAL HIGH

## 2011-01-13 LAB — PROTHROMBIN GENE MUTATION

## 2011-01-13 LAB — PHOSPHORUS: Phosphorus: 3.4

## 2011-01-13 LAB — ANTITHROMBIN III: AntiThromb III Func: 98 (ref 76–126)

## 2011-01-13 LAB — MAGNESIUM: Magnesium: 2

## 2011-01-13 LAB — PROTEIN S, TOTAL: Protein S Ag, Total: 137 % (ref 70–140)

## 2011-01-13 LAB — BETA-2-GLYCOPROTEIN I ABS, IGG/M/A
Beta-2 Glyco I IgG: <4 U/mL (ref <20)
Beta-2-Glycoprotein I IgA: <4 U/mL (ref <10)
Beta-2-Glycoprotein I IgM: <4 U/mL (ref <10)

## 2011-01-13 LAB — TSH: TSH: 2.936

## 2011-01-13 LAB — CK: Total CK: 769 — ABNORMAL HIGH

## 2011-01-13 LAB — FACTOR 5 LEIDEN

## 2011-01-13 LAB — APTT: aPTT: 27

## 2011-01-13 LAB — B-NATRIURETIC PEPTIDE (CONVERTED LAB): Pro B Natriuretic peptide (BNP): <30

## 2011-01-13 LAB — PROTEIN S ACTIVITY: Protein S Activity: 144 % — ABNORMAL HIGH (ref 69–129)

## 2011-01-14 ENCOUNTER — Inpatient Hospital Stay (INDEPENDENT_AMBULATORY_CARE_PROVIDER_SITE_OTHER)
Admission: RE | Admit: 2011-01-14 | Discharge: 2011-01-14 | Disposition: A | Discharge status: Home / Self Care | Payer: BC Managed Care – PPO | Source: Ambulatory Visit | Attending: Family Medicine | Admitting: Family Medicine

## 2011-01-14 DIAGNOSIS — J069 Acute upper respiratory infection, unspecified: Secondary | ICD-10-CM

## 2011-01-14 LAB — GLUCOSE, CAPILLARY: Glucose-Capillary: 67 mg/dL — ABNORMAL LOW (ref 70–99)

## 2011-01-16 LAB — BLOOD GAS, ARTERIAL
Acid-base deficit: 5.3 — ABNORMAL HIGH
Bicarbonate: 18.5 — ABNORMAL LOW
Drawn by: 287641
FIO2: 0.21
O2 Saturation: 94.5
Patient temperature: 98.6
TCO2: 19.4
pCO2 arterial: 29.8 — ABNORMAL LOW
pH, Arterial: 7.409 — ABNORMAL HIGH
pO2, Arterial: 73.2 — ABNORMAL LOW

## 2011-01-18 ENCOUNTER — Emergency Department (HOSPITAL_COMMUNITY): Payer: BC Managed Care – PPO

## 2011-01-18 ENCOUNTER — Inpatient Hospital Stay (HOSPITAL_COMMUNITY)
Admission: EM | Admit: 2011-01-18 | Discharge: 2011-01-28 | DRG: 558 | Disposition: A | Discharge status: Rehabilitation Facility | Payer: BC Managed Care – PPO | Attending: Internal Medicine | Admitting: Internal Medicine

## 2011-01-18 DIAGNOSIS — Z7901 Long term (current) use of anticoagulants: Secondary | ICD-10-CM

## 2011-01-18 DIAGNOSIS — G8250 Quadriplegia, unspecified: Secondary | ICD-10-CM | POA: Diagnosis present

## 2011-01-18 DIAGNOSIS — H409 Unspecified glaucoma: Secondary | ICD-10-CM | POA: Diagnosis present

## 2011-01-18 DIAGNOSIS — E119 Type 2 diabetes mellitus without complications: Secondary | ICD-10-CM | POA: Diagnosis present

## 2011-01-18 DIAGNOSIS — K219 Gastro-esophageal reflux disease without esophagitis: Secondary | ICD-10-CM | POA: Diagnosis present

## 2011-01-18 DIAGNOSIS — M5000 Cervical disc disorder with myelopathy, unspecified cervical region: Secondary | ICD-10-CM | POA: Diagnosis present

## 2011-01-18 DIAGNOSIS — M4712 Other spondylosis with myelopathy, cervical region: Principal | ICD-10-CM | POA: Diagnosis present

## 2011-01-18 DIAGNOSIS — I10 Essential (primary) hypertension: Secondary | ICD-10-CM | POA: Diagnosis present

## 2011-01-18 DIAGNOSIS — I2782 Chronic pulmonary embolism: Secondary | ICD-10-CM | POA: Diagnosis present

## 2011-01-18 DIAGNOSIS — Z9181 History of falling: Secondary | ICD-10-CM

## 2011-01-18 DIAGNOSIS — D72829 Elevated white blood cell count, unspecified: Secondary | ICD-10-CM | POA: Diagnosis present

## 2011-01-18 LAB — URINALYSIS, ROUTINE W REFLEX MICROSCOPIC
Bilirubin Urine: NEGATIVE
Glucose, UA: 250 mg/dL — AB
Hgb urine dipstick: NEGATIVE
Ketones, ur: NEGATIVE mg/dL
Leukocytes, UA: NEGATIVE
Nitrite: NEGATIVE
Protein, ur: NEGATIVE mg/dL
Specific Gravity, Urine: 1.014 (ref 1.005–1.030)
Urobilinogen, UA: 0.2 mg/dL (ref 0.0–1.0)
pH: 5.5 (ref 5.0–8.0)

## 2011-01-18 LAB — GLUCOSE, CAPILLARY
Glucose-Capillary: 41 mg/dL — CL (ref 70–99)
Glucose-Capillary: 106 mg/dL — ABNORMAL HIGH (ref 70–99)
Glucose-Capillary: 155 mg/dL — ABNORMAL HIGH (ref 70–99)
Glucose-Capillary: 98 mg/dL (ref 70–99)
Glucose-Capillary: 88 mg/dL (ref 70–99)

## 2011-01-18 LAB — CBC
HCT: 37 % (ref 36.0–46.0)
Hemoglobin: 12.7 g/dL (ref 12.0–15.0)
MCH: 27.4 pg (ref 26.0–34.0)
MCHC: 34.3 g/dL (ref 30.0–36.0)
MCV: 79.7 fL (ref 78.0–100.0)
Platelets: 135 10*3/uL — ABNORMAL LOW (ref 150–400)
RBC: 4.64 MIL/uL (ref 3.87–5.11)
RDW: 14.7 % (ref 11.5–15.5)
WBC: 5.7 10*3/uL (ref 4.0–10.5)

## 2011-01-18 LAB — POCT I-STAT, CHEM 8
BUN: 17 mg/dL (ref 6–23)
Calcium, Ion: 1.18 mmol/L (ref 1.12–1.32)
Chloride: 107 mEq/L (ref 96–112)
Creatinine, Ser: 1 mg/dL (ref 0.50–1.10)
Glucose, Bld: 189 mg/dL — ABNORMAL HIGH (ref 70–99)
HCT: 40 % (ref 36.0–46.0)
Hemoglobin: 13.6 g/dL (ref 12.0–15.0)
Potassium: 3.9 mEq/L (ref 3.5–5.1)
Sodium: 142 mEq/L (ref 135–145)
TCO2: 22 mmol/L (ref 0–100)

## 2011-01-18 LAB — DIFFERENTIAL
Basophils Absolute: 0.1 10*3/uL (ref 0.0–0.1)
Basophils Relative: 1 % (ref 0–1)
Eosinophils Absolute: 0.5 10*3/uL (ref 0.0–0.7)
Eosinophils Relative: 8 % — ABNORMAL HIGH (ref 0–5)
Lymphocytes Relative: 33 % (ref 12–46)
Lymphs Abs: 1.9 10*3/uL (ref 0.7–4.0)
Monocytes Absolute: 0.3 10*3/uL (ref 0.1–1.0)
Monocytes Relative: 5 % (ref 3–12)
Neutro Abs: 3.1 10*3/uL (ref 1.7–7.7)
Neutrophils Relative %: 53 % (ref 43–77)

## 2011-01-18 LAB — POCT I-STAT TROPONIN I: Troponin i, poc: 0 ng/mL (ref 0.00–0.08)

## 2011-01-18 LAB — IRON AND TIBC
Iron: 55 ug/dL (ref 42–135)
Saturation Ratios: 19 % — ABNORMAL LOW (ref 20–55)
TIBC: 291 ug/dL (ref 250–470)
UIBC: 236 ug/dL (ref 125–400)

## 2011-01-18 LAB — CK TOTAL AND CKMB (NOT AT ARMC)
CK, MB: 1.9 ng/mL (ref 0.3–4.0)
Relative Index: INVALID (ref 0.0–2.5)
Total CK: 89 U/L (ref 7–177)

## 2011-01-18 LAB — PROTIME-INR
INR: 2.03 — ABNORMAL HIGH (ref 0.00–1.49)
Prothrombin Time: 23.3 seconds — ABNORMAL HIGH (ref 11.6–15.2)

## 2011-01-18 LAB — TECHNOLOGIST SMEAR REVIEW

## 2011-01-19 ENCOUNTER — Inpatient Hospital Stay (HOSPITAL_COMMUNITY): Payer: BC Managed Care – PPO

## 2011-01-19 LAB — COMPREHENSIVE METABOLIC PANEL
ALT: 24 U/L (ref 0–35)
AST: 20 U/L (ref 0–37)
Albumin: 3.2 g/dL — ABNORMAL LOW (ref 3.5–5.2)
Alkaline Phosphatase: 67 U/L (ref 39–117)
BUN: 14 mg/dL (ref 6–23)
CO2: 26 mEq/L (ref 19–32)
Calcium: 9.1 mg/dL (ref 8.4–10.5)
Chloride: 108 mEq/L (ref 96–112)
Creatinine, Ser: 0.94 mg/dL (ref 0.50–1.10)
GFR calc Af Amer: 80 mL/min — ABNORMAL LOW (ref >90)
GFR calc non Af Amer: 69 mL/min — ABNORMAL LOW (ref >90)
Glucose, Bld: 85 mg/dL (ref 70–99)
Potassium: 4 mEq/L (ref 3.5–5.1)
Sodium: 139 mEq/L (ref 135–145)
Total Bilirubin: 0.2 mg/dL — ABNORMAL LOW (ref 0.3–1.2)
Total Protein: 6.5 g/dL (ref 6.0–8.3)

## 2011-01-19 LAB — PROTIME-INR
INR: 2.39 — ABNORMAL HIGH (ref 0.00–1.49)
Prothrombin Time: 26.5 seconds — ABNORMAL HIGH (ref 11.6–15.2)

## 2011-01-19 LAB — T3, FREE: T3, Free: 2.7 pg/mL (ref 2.3–4.2)

## 2011-01-19 LAB — FOLATE: Folate: >20 ng/mL

## 2011-01-19 LAB — URINE CULTURE
Colony Count: 15000
Colony Count: NO GROWTH
Culture  Setup Time: 201209301126
Culture  Setup Time: 201209302329
Culture: NO GROWTH

## 2011-01-19 LAB — GLUCOSE, CAPILLARY
Glucose-Capillary: 71 mg/dL (ref 70–99)
Glucose-Capillary: 81 mg/dL (ref 70–99)
Glucose-Capillary: 100 mg/dL — ABNORMAL HIGH (ref 70–99)
Glucose-Capillary: 140 mg/dL — ABNORMAL HIGH (ref 70–99)

## 2011-01-19 LAB — FERRITIN: Ferritin: 25 ng/mL (ref 10–291)

## 2011-01-19 LAB — FOLATE RBC: RBC Folate: 147 ng/mL — ABNORMAL LOW (ref >=366)

## 2011-01-19 LAB — THYROID ANTIBODIES
Thyroglobulin Ab: <20 U/mL (ref <40.0)
Thyroperoxidase Ab SerPl-aCnc: <10 IU/mL (ref <35.0)

## 2011-01-19 LAB — PHOSPHORUS: Phosphorus: 3.8 mg/dL (ref 2.3–4.6)

## 2011-01-19 LAB — VITAMIN B12: Vitamin B-12: >2000 pg/mL — ABNORMAL HIGH (ref 211–911)

## 2011-01-19 LAB — SEDIMENTATION RATE: Sed Rate: 12 mm/hr (ref 0–22)

## 2011-01-19 LAB — MAGNESIUM: Magnesium: 2 mg/dL (ref 1.5–2.5)

## 2011-01-19 LAB — TSH: TSH: 2.962 u[IU]/mL (ref 0.350–4.500)

## 2011-01-20 LAB — GLUCOSE, CAPILLARY
Glucose-Capillary: 79 mg/dL (ref 70–99)
Glucose-Capillary: 84 mg/dL (ref 70–99)
Glucose-Capillary: 81 mg/dL (ref 70–99)
Glucose-Capillary: 185 mg/dL — ABNORMAL HIGH (ref 70–99)

## 2011-01-20 LAB — ANA: Anti Nuclear Antibody(ANA): NEGATIVE

## 2011-01-20 LAB — PROTIME-INR
INR: 2.62 — ABNORMAL HIGH (ref 0.00–1.49)
Prothrombin Time: 28.4 seconds — ABNORMAL HIGH (ref 11.6–15.2)

## 2011-01-20 LAB — C-REACTIVE PROTEIN: CRP: 0.29 mg/dL — ABNORMAL LOW (ref <0.60)

## 2011-01-21 LAB — GLUCOSE, CAPILLARY
Glucose-Capillary: 148 mg/dL — ABNORMAL HIGH (ref 70–99)
Glucose-Capillary: 152 mg/dL — ABNORMAL HIGH (ref 70–99)
Glucose-Capillary: 211 mg/dL — ABNORMAL HIGH (ref 70–99)

## 2011-01-21 LAB — CBC
HCT: 38 % (ref 36.0–46.0)
Hemoglobin: 13.2 g/dL (ref 12.0–15.0)
MCH: 27.3 pg (ref 26.0–34.0)
MCHC: 34.7 g/dL (ref 30.0–36.0)
MCV: 78.7 fL (ref 78.0–100.0)
Platelets: 148 10*3/uL — ABNORMAL LOW (ref 150–400)
RBC: 4.83 MIL/uL (ref 3.87–5.11)
RDW: 14.5 % (ref 11.5–15.5)
WBC: 7.3 10*3/uL (ref 4.0–10.5)

## 2011-01-21 LAB — PROTIME-INR
INR: 2.37 — ABNORMAL HIGH (ref 0.00–1.49)
Prothrombin Time: 26.3 seconds — ABNORMAL HIGH (ref 11.6–15.2)

## 2011-01-22 ENCOUNTER — Inpatient Hospital Stay (HOSPITAL_COMMUNITY): Payer: BC Managed Care – PPO

## 2011-01-22 LAB — GLUCOSE, CAPILLARY
Glucose-Capillary: 131 mg/dL — ABNORMAL HIGH (ref 70–99)
Glucose-Capillary: 108 mg/dL — ABNORMAL HIGH (ref 70–99)
Glucose-Capillary: 142 mg/dL — ABNORMAL HIGH (ref 70–99)

## 2011-01-22 LAB — BASIC METABOLIC PANEL
BUN: 19 mg/dL (ref 6–23)
CO2: 24 mEq/L (ref 19–32)
Calcium: 9.7 mg/dL (ref 8.4–10.5)
Chloride: 110 mEq/L (ref 96–112)
Creatinine, Ser: 1.01 mg/dL (ref 0.50–1.10)
GFR calc Af Amer: 73 mL/min — ABNORMAL LOW (ref >90)
GFR calc non Af Amer: 63 mL/min — ABNORMAL LOW (ref >90)
Glucose, Bld: 136 mg/dL — ABNORMAL HIGH (ref 70–99)
Potassium: 4.2 mEq/L (ref 3.5–5.1)
Sodium: 142 mEq/L (ref 135–145)

## 2011-01-22 LAB — PROTIME-INR
INR: 2.09 — ABNORMAL HIGH (ref 0.00–1.49)
Prothrombin Time: 23.8 seconds — ABNORMAL HIGH (ref 11.6–15.2)

## 2011-01-22 LAB — ACETYLCHOLINE RECEPTOR, BINDING: Acetylcholine Receptor Ab: <0.3 nmol/L (ref <=0.30)

## 2011-01-23 ENCOUNTER — Inpatient Hospital Stay (HOSPITAL_COMMUNITY): Payer: BC Managed Care – PPO

## 2011-01-23 LAB — CBC
HCT: 40.6 % (ref 36.0–46.0)
Hemoglobin: 14.1 g/dL (ref 12.0–15.0)
MCH: 27.5 pg (ref 26.0–34.0)
MCHC: 34.7 g/dL (ref 30.0–36.0)
MCV: 79.3 fL (ref 78.0–100.0)
Platelets: 169 10*3/uL (ref 150–400)
RBC: 5.12 MIL/uL — ABNORMAL HIGH (ref 3.87–5.11)
RDW: 14.6 % (ref 11.5–15.5)
WBC: 15.8 10*3/uL — ABNORMAL HIGH (ref 4.0–10.5)

## 2011-01-23 LAB — GLUCOSE, CAPILLARY
Glucose-Capillary: 137 mg/dL — ABNORMAL HIGH (ref 70–99)
Glucose-Capillary: 161 mg/dL — ABNORMAL HIGH (ref 70–99)
Glucose-Capillary: 220 mg/dL — ABNORMAL HIGH (ref 70–99)

## 2011-01-23 LAB — PROTIME-INR
INR: 1.79 — ABNORMAL HIGH (ref 0.00–1.49)
Prothrombin Time: 21.1 seconds — ABNORMAL HIGH (ref 11.6–15.2)

## 2011-01-23 LAB — STRIATED MUSCLE ANTIBODY: Striated Muscle Ab: <1:40 {titer}

## 2011-01-23 MED ORDER — IOHEXOL 300 MG/ML  SOLN
100.0000 mL | Freq: Once | INTRAMUSCULAR | Status: AC | PRN
  Administered 2011-01-23: 40 mL via INTRAVENOUS

## 2011-01-24 LAB — PROTIME-INR
INR: 1.65 — ABNORMAL HIGH (ref 0.00–1.49)
Prothrombin Time: 19.8 seconds — ABNORMAL HIGH (ref 11.6–15.2)

## 2011-01-24 LAB — CBC
HCT: 37.4 % (ref 36.0–46.0)
Hemoglobin: 13 g/dL (ref 12.0–15.0)
MCH: 27.5 pg (ref 26.0–34.0)
MCHC: 34.8 g/dL (ref 30.0–36.0)
MCV: 79.1 fL (ref 78.0–100.0)
Platelets: 151 10*3/uL (ref 150–400)
RBC: 4.73 MIL/uL (ref 3.87–5.11)
RDW: 14.7 % (ref 11.5–15.5)
WBC: 12.2 10*3/uL — ABNORMAL HIGH (ref 4.0–10.5)

## 2011-01-24 LAB — GLUCOSE, CAPILLARY
Glucose-Capillary: 129 mg/dL — ABNORMAL HIGH (ref 70–99)
Glucose-Capillary: 120 mg/dL — ABNORMAL HIGH (ref 70–99)
Glucose-Capillary: 123 mg/dL — ABNORMAL HIGH (ref 70–99)
Glucose-Capillary: 272 mg/dL — ABNORMAL HIGH (ref 70–99)

## 2011-01-25 LAB — BASIC METABOLIC PANEL
BUN: 31 mg/dL — ABNORMAL HIGH (ref 6–23)
CO2: 22 mEq/L (ref 19–32)
Calcium: 8.9 mg/dL (ref 8.4–10.5)
Chloride: 111 mEq/L (ref 96–112)
Creatinine, Ser: 1.35 mg/dL — ABNORMAL HIGH (ref 0.50–1.10)
GFR calc Af Amer: 52 mL/min — ABNORMAL LOW (ref >90)
GFR calc non Af Amer: 45 mL/min — ABNORMAL LOW (ref >90)
Glucose, Bld: 118 mg/dL — ABNORMAL HIGH (ref 70–99)
Potassium: 4.2 mEq/L (ref 3.5–5.1)
Sodium: 142 mEq/L (ref 135–145)

## 2011-01-25 LAB — GLUCOSE, CAPILLARY
Glucose-Capillary: 126 mg/dL — ABNORMAL HIGH (ref 70–99)
Glucose-Capillary: 151 mg/dL — ABNORMAL HIGH (ref 70–99)

## 2011-01-25 LAB — PROTIME-INR
INR: 1.51 — ABNORMAL HIGH (ref 0.00–1.49)
Prothrombin Time: 18.5 seconds — ABNORMAL HIGH (ref 11.6–15.2)

## 2011-01-26 ENCOUNTER — Inpatient Hospital Stay (HOSPITAL_COMMUNITY): Payer: BC Managed Care – PPO

## 2011-01-26 HISTORY — PX: POSTERIOR LAMINECTOMY / DECOMPRESSION CERVICAL SPINE: SUR739

## 2011-01-26 LAB — CBC
HCT: 37.8 % (ref 36.0–46.0)
Hemoglobin: 13.4 g/dL (ref 12.0–15.0)
MCH: 27.9 pg (ref 26.0–34.0)
MCHC: 35.4 g/dL (ref 30.0–36.0)
MCV: 78.8 fL (ref 78.0–100.0)
Platelets: 146 10*3/uL — ABNORMAL LOW (ref 150–400)
RBC: 4.8 MIL/uL (ref 3.87–5.11)
RDW: 14.6 % (ref 11.5–15.5)
WBC: 13 10*3/uL — ABNORMAL HIGH (ref 4.0–10.5)

## 2011-01-26 LAB — BASIC METABOLIC PANEL
BUN: 29 mg/dL — ABNORMAL HIGH (ref 6–23)
CO2: 22 mEq/L (ref 19–32)
Calcium: 8.4 mg/dL (ref 8.4–10.5)
Chloride: 110 mEq/L (ref 96–112)
Creatinine, Ser: 1.37 mg/dL — ABNORMAL HIGH (ref 0.50–1.10)
GFR calc Af Amer: 51 mL/min — ABNORMAL LOW (ref >90)
GFR calc non Af Amer: 44 mL/min — ABNORMAL LOW (ref >90)
Glucose, Bld: 138 mg/dL — ABNORMAL HIGH (ref 70–99)
Potassium: 4.2 mEq/L (ref 3.5–5.1)
Sodium: 141 mEq/L (ref 135–145)

## 2011-01-26 LAB — ABO/RH: ABO/RH(D): O POS

## 2011-01-26 LAB — GLUCOSE, CAPILLARY
Glucose-Capillary: 128 mg/dL — ABNORMAL HIGH (ref 70–99)
Glucose-Capillary: 117 mg/dL — ABNORMAL HIGH (ref 70–99)
Glucose-Capillary: 120 mg/dL — ABNORMAL HIGH (ref 70–99)

## 2011-01-26 LAB — SURGICAL PCR SCREEN
MRSA, PCR: NEGATIVE
Staphylococcus aureus: NEGATIVE

## 2011-01-26 LAB — PROTIME-INR
INR: 1.33 (ref 0.00–1.49)
Prothrombin Time: 16.7 seconds — ABNORMAL HIGH (ref 11.6–15.2)

## 2011-01-26 LAB — MISCELLANEOUS TEST

## 2011-01-26 LAB — APTT: aPTT: 31 seconds (ref 24–37)

## 2011-01-27 DIAGNOSIS — M5020 Other cervical disc displacement, unspecified cervical region: Secondary | ICD-10-CM

## 2011-01-27 LAB — URINALYSIS, ROUTINE W REFLEX MICROSCOPIC
Bilirubin Urine: NEGATIVE
Glucose, UA: NEGATIVE mg/dL
Hgb urine dipstick: NEGATIVE
Ketones, ur: NEGATIVE mg/dL
Leukocytes, UA: NEGATIVE
Nitrite: NEGATIVE
Protein, ur: NEGATIVE mg/dL
Specific Gravity, Urine: 1.015 (ref 1.005–1.030)
Urobilinogen, UA: 0.2 mg/dL (ref 0.0–1.0)
pH: 5.5 (ref 5.0–8.0)

## 2011-01-27 LAB — GLUCOSE, CAPILLARY
Glucose-Capillary: 127 mg/dL — ABNORMAL HIGH (ref 70–99)
Glucose-Capillary: 114 mg/dL — ABNORMAL HIGH (ref 70–99)
Glucose-Capillary: 129 mg/dL — ABNORMAL HIGH (ref 70–99)
Glucose-Capillary: 123 mg/dL — ABNORMAL HIGH (ref 70–99)
Glucose-Capillary: 122 mg/dL — ABNORMAL HIGH (ref 70–99)

## 2011-01-27 LAB — CBC
HCT: 39.8 % (ref 36.0–46.0)
Hemoglobin: 13.7 g/dL (ref 12.0–15.0)
MCH: 27.2 pg (ref 26.0–34.0)
MCHC: 34.4 g/dL (ref 30.0–36.0)
MCV: 79 fL (ref 78.0–100.0)
Platelets: 153 10*3/uL (ref 150–400)
RBC: 5.04 MIL/uL (ref 3.87–5.11)
RDW: 14.8 % (ref 11.5–15.5)
WBC: 12.9 10*3/uL — ABNORMAL HIGH (ref 4.0–10.5)

## 2011-01-27 LAB — URINE MICROSCOPIC-ADD ON

## 2011-01-27 LAB — PROTIME-INR
INR: 1.27 (ref 0.00–1.49)
Prothrombin Time: 16.2 seconds — ABNORMAL HIGH (ref 11.6–15.2)

## 2011-01-28 ENCOUNTER — Inpatient Hospital Stay (HOSPITAL_COMMUNITY)
Admission: RE | Admit: 2011-01-28 | Discharge: 2011-02-12 | DRG: 462 | Disposition: A | Discharge status: Home / Self Care | Payer: BC Managed Care – PPO | Source: Other Acute Inpatient Hospital | Attending: Physical Medicine & Rehabilitation | Admitting: Physical Medicine & Rehabilitation

## 2011-01-28 DIAGNOSIS — D72829 Elevated white blood cell count, unspecified: Secondary | ICD-10-CM | POA: Diagnosis not present

## 2011-01-28 DIAGNOSIS — E119 Type 2 diabetes mellitus without complications: Secondary | ICD-10-CM | POA: Diagnosis present

## 2011-01-28 DIAGNOSIS — Z8673 Personal history of transient ischemic attack (TIA), and cerebral infarction without residual deficits: Secondary | ICD-10-CM

## 2011-01-28 DIAGNOSIS — Z981 Arthrodesis status: Secondary | ICD-10-CM

## 2011-01-28 DIAGNOSIS — G4733 Obstructive sleep apnea (adult) (pediatric): Secondary | ICD-10-CM | POA: Diagnosis present

## 2011-01-28 DIAGNOSIS — N390 Urinary tract infection, site not specified: Secondary | ICD-10-CM | POA: Diagnosis not present

## 2011-01-28 DIAGNOSIS — D696 Thrombocytopenia, unspecified: Secondary | ICD-10-CM | POA: Diagnosis not present

## 2011-01-28 DIAGNOSIS — K219 Gastro-esophageal reflux disease without esophagitis: Secondary | ICD-10-CM | POA: Diagnosis present

## 2011-01-28 DIAGNOSIS — Z7901 Long term (current) use of anticoagulants: Secondary | ICD-10-CM

## 2011-01-28 DIAGNOSIS — J45909 Unspecified asthma, uncomplicated: Secondary | ICD-10-CM | POA: Diagnosis present

## 2011-01-28 DIAGNOSIS — I10 Essential (primary) hypertension: Secondary | ICD-10-CM | POA: Diagnosis present

## 2011-01-28 DIAGNOSIS — Z5189 Encounter for other specified aftercare: Principal | ICD-10-CM

## 2011-01-28 DIAGNOSIS — M5000 Cervical disc disorder with myelopathy, unspecified cervical region: Secondary | ICD-10-CM

## 2011-01-28 DIAGNOSIS — B964 Proteus (mirabilis) (morganii) as the cause of diseases classified elsewhere: Secondary | ICD-10-CM | POA: Diagnosis not present

## 2011-01-28 DIAGNOSIS — H409 Unspecified glaucoma: Secondary | ICD-10-CM | POA: Diagnosis present

## 2011-01-28 DIAGNOSIS — N179 Acute kidney failure, unspecified: Secondary | ICD-10-CM | POA: Diagnosis not present

## 2011-01-28 DIAGNOSIS — M4712 Other spondylosis with myelopathy, cervical region: Secondary | ICD-10-CM | POA: Diagnosis present

## 2011-01-28 LAB — PROTIME-INR
INR: 1.18 (ref 0.00–1.49)
Prothrombin Time: 15.3 seconds — ABNORMAL HIGH (ref 11.6–15.2)

## 2011-01-28 LAB — CROSSMATCH
ABO/RH(D): O POS
Antibody Screen: NEGATIVE
Unit division: 0
Unit division: 0

## 2011-01-28 LAB — URINALYSIS, ROUTINE W REFLEX MICROSCOPIC
Bilirubin Urine: NEGATIVE
Glucose, UA: NEGATIVE mg/dL
Hgb urine dipstick: NEGATIVE
Ketones, ur: NEGATIVE mg/dL
Leukocytes, UA: NEGATIVE
Nitrite: NEGATIVE
Protein, ur: NEGATIVE mg/dL
Specific Gravity, Urine: 1.013 (ref 1.005–1.030)
Urobilinogen, UA: 0.2 mg/dL (ref 0.0–1.0)
pH: 5 (ref 5.0–8.0)

## 2011-01-28 LAB — GLUCOSE, CAPILLARY
Glucose-Capillary: 87 mg/dL (ref 70–99)
Glucose-Capillary: 116 mg/dL — ABNORMAL HIGH (ref 70–99)
Glucose-Capillary: 87 mg/dL (ref 70–99)

## 2011-01-28 LAB — URINE MICROSCOPIC-ADD ON

## 2011-01-29 DIAGNOSIS — M1710 Unilateral primary osteoarthritis, unspecified knee: Secondary | ICD-10-CM

## 2011-01-29 DIAGNOSIS — M5000 Cervical disc disorder with myelopathy, unspecified cervical region: Secondary | ICD-10-CM

## 2011-01-29 LAB — CBC
HCT: 36 % (ref 36.0–46.0)
Hemoglobin: 12.3 g/dL (ref 12.0–15.0)
MCH: 27 pg (ref 26.0–34.0)
MCHC: 34.2 g/dL (ref 30.0–36.0)
MCV: 79.1 fL (ref 78.0–100.0)
Platelets: 106 10*3/uL — ABNORMAL LOW (ref 150–400)
RBC: 4.55 MIL/uL (ref 3.87–5.11)
RDW: 15.2 % (ref 11.5–15.5)
WBC: 10.4 10*3/uL (ref 4.0–10.5)

## 2011-01-29 LAB — GLUCOSE, CAPILLARY
Glucose-Capillary: 92 mg/dL (ref 70–99)
Glucose-Capillary: 87 mg/dL (ref 70–99)
Glucose-Capillary: 69 mg/dL — ABNORMAL LOW (ref 70–99)
Glucose-Capillary: 132 mg/dL — ABNORMAL HIGH (ref 70–99)
Glucose-Capillary: 104 mg/dL — ABNORMAL HIGH (ref 70–99)

## 2011-01-29 LAB — COMPREHENSIVE METABOLIC PANEL
ALT: 36 U/L — ABNORMAL HIGH (ref 0–35)
AST: 17 U/L (ref 0–37)
Albumin: 2.5 g/dL — ABNORMAL LOW (ref 3.5–5.2)
Alkaline Phosphatase: 51 U/L (ref 39–117)
BUN: 21 mg/dL (ref 6–23)
CO2: 25 mEq/L (ref 19–32)
Calcium: 8.3 mg/dL — ABNORMAL LOW (ref 8.4–10.5)
Chloride: 109 mEq/L (ref 96–112)
Creatinine, Ser: 0.99 mg/dL (ref 0.50–1.10)
GFR calc Af Amer: 75 mL/min — ABNORMAL LOW (ref >90)
GFR calc non Af Amer: 65 mL/min — ABNORMAL LOW (ref >90)
Glucose, Bld: 80 mg/dL (ref 70–99)
Potassium: 3.8 mEq/L (ref 3.5–5.1)
Sodium: 142 mEq/L (ref 135–145)
Total Bilirubin: 0.4 mg/dL (ref 0.3–1.2)
Total Protein: 5.4 g/dL — ABNORMAL LOW (ref 6.0–8.3)

## 2011-01-29 LAB — DIFFERENTIAL
Basophils Absolute: 0 10*3/uL (ref 0.0–0.1)
Basophils Relative: 0 % (ref 0–1)
Eosinophils Absolute: 0.2 10*3/uL (ref 0.0–0.7)
Eosinophils Relative: 2 % (ref 0–5)
Lymphocytes Relative: 28 % (ref 12–46)
Lymphs Abs: 2.9 10*3/uL (ref 0.7–4.0)
Monocytes Absolute: 1.5 10*3/uL — ABNORMAL HIGH (ref 0.1–1.0)
Monocytes Relative: 14 % — ABNORMAL HIGH (ref 3–12)
Neutro Abs: 5.8 10*3/uL (ref 1.7–7.7)
Neutrophils Relative %: 56 % (ref 43–77)

## 2011-01-29 LAB — PROTIME-INR
INR: 1.22 (ref 0.00–1.49)
Prothrombin Time: 15.7 seconds — ABNORMAL HIGH (ref 11.6–15.2)

## 2011-01-29 NOTE — Discharge Summary (Signed)
[name], NUZZO NO.:  [id number]  MEDICAL RECORD NO.:  [id number]  LOCATION:  5015                         FACILITY:  [facility]  PHYSICIAN:  Isidor Holts, M.D.  DATE OF BIRTH:  09-Mar-1961  DATE OF ADMISSION:  01/18/2011 DATE OF DISCHARGE:  01/28/2011                              DISCHARGE SUMMARY   PRIMARY MD:  [name], MD.  DISCHARGE DIAGNOSES: 1. Cervical spondylosis with herniated nucleus pulposus/myelopathy, C3-     C4 and C4-C5. 2. 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 3. Type 2 diabetes mellitus. 4. Morbid obesity, status post previous gastric bypass surgery. 5. Obstructive sleep apnea syndrome, on nocturnal CPAP. 6. History of pulmonary embolism, on chronic anticoagulation, now     status post IVC filter, January 23, 2011. 7. Hypertension. 8. Bronchial asthma. 9. Glaucoma. 10.Gastroesophageal reflux disease.  DISCHARGE MEDICATIONS: 1. Acetaminophen 600 mg p.o. p.r.n. q.4 hourly for pain. 2. Colace 100 mg p.o. b.i.d. 3. Hydrocodone/APAP (5/325) one to two tablets p.o. p.r.n. q.4 hourly     for pain. 4. Niacin 500 mg p.o. daily. 5. Coumadin per INR, currently on 10 mg p.o. at 6 p.m. (target INR 2-     3). 6. Alphagan 0.1% ophthalmic solution, one drop each eye t.i.d. 7. Azopt ophthalmic solution 1%, one drop left eye t.i.d. 8. Alvesco 160 mcg two puffs inhaled b.i.d. 9. Biotin 5 mg p.o. q.a.m. 10.Cartia XT 240 mg p.o. at bedtime. 11.Caltrate plus vitamin D OTC 600 mg two tablets p.o. b.i.d. 12.Diovan/HCT (80/12.5) one tablet p.o. q.a.m. 13.Fexofenadine 180 mg p.o. at bedtime. 14.Fish oil (omega-3) 1000 mg p.o. b.i.d. 15.Flexeril 10 mg p.o. at bedtime. 16.Lumigan 2% ophthalmic solution, one drop left eye  at bedtime. 17.Mucinex 1200 mg p.o. b.i.d. 18.Multivitamin two tablets p.o. at bedtime. 19.Nexium 40 mg p.o. daily. 20.Pataday 0.2% eye drops, one drop left eye p.r.n. q.i.d. for eye     allergies. 21.Quinine 324 mg p.o. at bedtime. 22.Slow-Mag OTC two tablets p.o. b.i.d. 23.Vitamin B12 OTC 500 mcg sublingually at bedtime. 24.Xanax 0.5 mg p.o. at bedtime. 25.Celebrex. 26.Klor-Con. 27.Cinnamon 500 mg. 28.Motrin and azithromycin had been discontinued. 29.Lovenox 130 mg subcutaneously q.12 hourly.  CONSULTATIONS: 1. Dr. Thad Ranger, neurologist. 2. Dr. Danielle Dess, neurosurgeon.  ADMISSION HISTORY:  As in H and P notes of January 18, 2011, dictated by Dr. Zannie Cove. However, in brief, this is a patient with history of morbid obesity, status past gastric bypass February 2010, recurrent pulmonary embolism, on chronic anticoagulation, diet controlled type 2 diabetes mellitus, hypertension, obstructive sleep apnea, nocturnal CPAP, bronchial asthma, history of dysfunctional uterine bleed, status post ablation, history of glaucoma, and GERD, presenting with progressive bilateral lower extremity weakness and upper extremity weakness/tingling, culminating in a fall on day of presentation.  She was hospitalized and worked up.  For details of extensive investigations, refer to progress note of January 27, 2011, dictated by Dr. Donnalee Curry.  She was finally concluded to have C3-C4/C4-C5 cervical spondylosis with herniated nucleus  pulposus and spondylotic myelopathy/quadriparesis.  Neurology consultation was kindly provided by Dr. Thana Farr.  Neurosurgical consultation was provided by Dr. Barnett Abu.  For full details, refer to progress note dictated by Dr. Donnalee Curry on January 27, 2011.    CLINICAL COURSE:  1. C3-C4/C4-C5 status post spondylosis/herniated nucleus pulposus.     This was complicated by quadriparesis.  The patient underwent     decompressive surgery on January 26, 2011, an uncomplicated     procedure carried out by Dr. Barnett Abu.  On January 28, 2011, it     was clear that quadriparesis had dramatically improved.  The     patient was seen by Physiotherapy, CIR recommended.  The patient     was subsequently evaluated by rehab MD and approved for     comprehensive inpatient rehabilitation.  2. Hypertension.  The patient has been normotensive on preadmission     antihypertensive medications.  3. History of venous thromboembolic disease.  The patient's Coumadin     was discontinued in anticipation of Neurosurgery.  She had an IVC     filter placed on January 23, 2011, following surgery i.e., on     January 28, 2011, i.e., 2 days postoperatively, she was cleared by     the neurosurgeons for recommencement of anticoagulation.  As of     January 28, 2011, INR was 1.18.  Target INR is between 2-3.  4. Obstructive sleep apnea syndrome.  The patient continues on     nocturnal CPAP.  5. Type 2 diabetes mellitus, this is diet controlled.  The patient     remained euglycemic during the course of her hospitalization.  DISPOSITION:  The patient was on January 28, 2011, considered clinically stable for discharge.  There were no new issues.  She was therefore discharged accordingly.  ACTIVITY:  As tolerated.  Recommended to increase activity slowly, otherwise per PT/OT/rehab.  DIET:  Heart-healthy/carbohydrate modified.  FOLLOWUP INSTRUCTION:  The patient will follow up with her primary MD, Dr. [name], following discharge and also with neurosurgeon, Dr. Barnett Abu on a date to be determined. Details will be described at the appropriate time, by discharging rehab MD.     Isidor Holts, M.D.     CO/MEDQ  D:  01/28/2011  T:  01/28/2011  Job:  [id number]  cc:   [name], M.D. Stefani Dama, M.D. Thana Farr, MD  Electronically Signed by Isidor Holts M.D. on 01/29/2011 05:48:42 PM

## 2011-01-29 NOTE — H&P (Signed)
[name], MEENACH NO.:  [id number]  MEDICAL RECORD NO.:  [id number]  LOCATION:  [facility]                         FACILITY:  [facility]  PHYSICIAN:  Zannie Cove, MD     DATE OF BIRTH:  April 28, 1960  DATE OF ADMISSION:  01/18/2011 DATE OF DISCHARGE:                             HISTORY & PHYSICAL   PRIMARY CARE PHYSICIAN:  [name], M.D.  CHIEF COMPLAINT:  Generalized weakness and numbness in her arms and legs.  HISTORY OF PRESENT ILLNESS:  [name] is a 50 year old female with history of gastric bypass over 2 years ago says that she was in her usual state of health up until a month ago when she started having issues with wheezing and took some Mucinex over-the-counter and then for the last 2 weeks, was not eating on schedule.  The patient reports that 10 days ago, she started with tingling in her fingers, which spread up to her hand and her right entire hand was numb 10 days ago and then got better over the weekend and then later on Monday, she started experiencing tingling in both hands and fingers extending up to the lower arms.  She subsequently went to a chiropractor, thought may be it is related to her high blood pressure and then rested on Tuesday and Wednesday, but still could not feel well.  Both legs were weak, after that which she had noticed about a week ago.  Did not have any numbness in her legs, about 7-8 days ago also felt her knees buckling and almost fell a week ago, Wednesday.  Then went to Urgent Care this last Wednesday and was told that her blood sugars were low and had some wheezing, was told that she probably was not eating well and then fell again 3 days ago.  In addition, early this morning got up to go to the bathroom and felt both her legs were weak.  Today, her right foot was numb and could not get off the bed.  Subsequently, fell down and lodge between the dresser and the bed and called EMS and arrived at the hospital.  Upon  evaluation in the emergency room, she was noted to have unremarkable laboratory work up, unremarkable UA.  INR which is therapeutic and had a CT of the head and cervical spine without any acute abnormality and triad hospitalist were consulted for further evaluation and management.  Of note, the patient denies any recent changes in medicines except for recently started on azithromycin few days ago and some cough medicine.  She also reports that she has been taking her vitamins B12 500 mg over the counter tablet daily at bedtime which she started about 10 days ago.  PAST MEDICAL HISTORY:  Significant for, 1. Morbid obesity status post gastric bypass in February 2010. 2. Recurrent PE on Coumadin. 3. History of diabetes, better controlled after bypass. 4. Hypertension. 5. Obstructive sleep apnea. 6. History of asthma. 7. History of dysfunctional uterine bleeding status post ablation. 8. History of IVC filter. 9. History of glaucoma. 10.History of GERD.  CURRENT MEDICATIONS:  Are as follows, 1. Pataday 0.2% eyedrops on left side q.i.d. p.r.n. 2. Azopt 1% left thigh 1  drop t.i.d. 3. Xanax 0.5 mg p.o. at bedtime. 4. Azithromycin 250 mg p.o. daily started on January 14, 2011. 5. Mucinex 1200 mg over-the-counter 1 tab q.12 h. 6. Coumadin, customized dosing. 7. Vitamin B12 over-the-counter 500 mcg daily. 8. Slow-Mag over-the-counter 2 tablets b.i.d. 9. Quinine 324 mg p.o. at bedtime. 10.Nexium 40 mg daily. 11.Multivitamin 2 tablets at bedtime. 12.Motrin 200 mg 4 tablets as needed. 13.Zofran 20 mEq 1-2 tablets b.i.d. 14.Flexeril 10 mg daily. 15.Fish oil 1000 mg over-the-counter 1 tablet b.i.d. 16.Fexofenadine 180 mg p.o. at bedtime. 17.Valsartan HCT 8/12.5 one tablet daily. 18.Cinnamon 500 mg 2 tablets b.i.d. 19.Celebrex 20 mg 1 tab daily. 20.Cardia XT, diltiazem 240 mg 1 tablet at bedtime. 21.Caltrate 600 mg 2 tablets b.i.d. 22.Vitamin 500 mg daily. 23.Alvesco 160 mcg inhaled 2  puffs b.i.d. 24.Alphagan 0.1% both eyes t.i.d. 25.Lumigan 3% left eye 1 tablet at bedtime.  SOCIAL HISTORY:  Single, lives at home by herself.  She is currently working.  Denies any history of alcohol, tobacco, or illicit drug use.  FAMILY HISTORY:  Father deceased from natural causes and mother from aneurysm rupture.  REVIEW OF SYSTEMS:  Negative except for HPI.  PHYSICAL EXAMINATION:  VITAL SIGNS:  Temp is 98.1, pulse is 72, blood pressure 118/58, respirations 23, and satting 99% on room air. GENERAL:  This is a obese African American female lying in the stretcher in no acute distress. HEENT:  Pupils are 5 mm reactive to light.  Oral mucosa moist. NECK:  No JVD or lymphadenopathy. CARDIOVASCULAR:  S1, S2 regular rate and rhythm. LUNGS:  With decreased breath sounds at the right base, otherwise unremarkable. ABDOMEN:  Obese, soft, nontender with normal bowel sounds.  No organomegaly. EXTREMITIES:  No edema, clubbing or cyanosis. NEUROLOGIC:  Strength is 5/5 in upper and lower extremity proximal and distal joints.  Sensations light touch and pinprick is decreased in both arms, especially right lower arm in a glove distribution.  Dysesthesia noted. Lower extremities sensations, light touch, and pinprick differentiation decreased.  Reflexes are 1+ in the lower extremities and 2+ in upper extremities.  Coordination:  Finger-to-nose no dysdiadochokinesia.  Gait is not assessed.  LABORATORY DATA: 1. Review of laboratory data shows normal CBC, MCV is normal.  INR of     2.0.  BUN is normal except for a glucose of 189.  UA is     unremarkable.  Cardiac enzymes x1 negative. 2. CT of the head, no acute intracranial abnormalities.  CT of the C-     spine shows no evidence of cervical spine fracture dislocation,     mild degenerative spondylosis.  ASSESSMENT:  This is a 50 year old female with, 1. Peripheral neuropathy. 2. Generalized weakness. 3. Morbid obesity status post gastric  bypass. 4. Recurrent pulmonary embolism on Coumadin. 5. Diabetes.  PLAN: 1. We will admit her to a regular medical floor today.  She is already     due to have an MRI of her spine ordered per ED physicians.  I     suspect this is decreased absorption of B12 after gastric bypass     surgery since her symptoms are classical peripheral neuropathy.     She could have subacute mild degeneration of the cord as well.     Follow up on the MRI results.  Depending on the vitamin B12 level,     we will start replacing 1000 mcg IM daily for a week and then     weekly and then monthly.  We will also  have physical therapy     evaluate the patient. 2. For the rest for her chronic medical problems, continue her home     medications. 3. Further management as condition evolves.     Zannie Cove, MD     PJ/MEDQ  D:  01/18/2011  T:  01/18/2011  Job:  [id number]  cc:   [name], M.D.  Electronically Signed by Zannie Cove  on 01/29/2011 01:44:35 PM

## 2011-01-30 DIAGNOSIS — M5000 Cervical disc disorder with myelopathy, unspecified cervical region: Secondary | ICD-10-CM

## 2011-01-30 LAB — URINE CULTURE
Colony Count: >=100000
Colony Count: >=100000
Culture  Setup Time: 201210100400
Culture  Setup Time: 201210102200
Special Requests: NEGATIVE
Special Requests: NEGATIVE

## 2011-01-30 LAB — GLUCOSE, CAPILLARY
Glucose-Capillary: 91 mg/dL (ref 70–99)
Glucose-Capillary: 70 mg/dL (ref 70–99)
Glucose-Capillary: 82 mg/dL (ref 70–99)
Glucose-Capillary: 108 mg/dL — ABNORMAL HIGH (ref 70–99)

## 2011-01-30 LAB — PROTIME-INR
INR: 1.24 (ref 0.00–1.49)
Prothrombin Time: 15.9 seconds — ABNORMAL HIGH (ref 11.6–15.2)

## 2011-01-30 NOTE — Progress Notes (Signed)
NAMEMAKENSIE, MULHALL NO.:  [id number]  MEDICAL RECORD NO.:  [id number]  LOCATION:  5015                         FACILITY:  [facility]  PHYSICIAN:  Kela Millin, M.D.DATE OF BIRTH:  03/16/1961                                PROGRESS NOTE   PRIMARY CARE PHYSICIAN: [name], MD  NEUROSURGEON: Stefani Dama, MD.  CURRENT DIAGNOSES: 1. 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 2. 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 3. Diabetes mellitus. 4. History of obstructive sleep apnea - on continuous positive airway     pressure. 5. History of pulmonary embolus - status post inferior vena cava     filter on January 23, 2011. 6. Morbid obesity. 7. Status post gastric bypass surgery in the past. 8. Hypertension. 9. History of asthma. 10.History of dysfunctional uterine bleeding and status post ablation. 11.History of glaucoma. 12.History of gastroesophageal reflux disease.  PROCEDURES AND STUDIES: 1. Chest x-ray on January 18, 2011 - no evidence of acute fracture     or subluxation of the pelvis.  Degenerative changes in the hips. 2. Followup chest x-ray on January 18, 2011 - no evidence of active     pulmonary disease. 3. CT scan of head on January 18, 2011, - no evidence of     intracranial abnormality or skull fracture.  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 4. CT scan of cervical spine - no evidence of cervical spine fracture     or subluxation.  Mild degenerative spondylosis. 5. MRI of the lumbar spine on January 18, 2011 - no acute traumatic     injury or epidural abscess.  Severe lower lumbar facet     degeneration,  maximal at L3-L4 where there is mild grade 1     spondylolisthesis.  Mild to moderate lower lumbar disk     degeneration.  Subsequent degenerative mild spinal and left lateral     recess stenosis at L3-L4, plus mild to moderate bilateral L3 and L4     neural foraminal stenoses. 6. MR I of brain on January 20, 2011 - limited by artifact from dental     braces.  Within limitations of this study, no acute intracranial     abnormality. 7. MRI of the C-spine on January 20, 2011 - degenerative spinal     stenosis at C3-C4 and C4-C5, mass effect on the spinal cord at both     levels, worse at C3-C4 where there is mildly abnormal cord signal,     which may represent a nonhemorrhagic contusions given history of     fall (versus signal due to compressive myelopathy).  Less     pronounced cervical degenerative changes also.  No acute osseous or     ligamentous injury identified. 8. X-ray of knee on January 23, 2011 - no  acute fracture or dislocation     seen. 9. Followup chest x-ray on January 23, 2011 - right central line tip     projects at the level of the proximal superior vena cava directed     slightly medially.  No gross pneumothorax.  Cardiomegaly.     Pulmonary vascular congestion/mild pulmonary edema. 10.IVC filter per Dr. Miles Costain on January 23, 2011. 11.Portable chest x-ray on January 20, 2011 - resolved pulmonary edema.     No acute cardiopulmonary process. 12.Cervical spine x-ray on January 26, 2011 - intraoperative     localization. 13.Followup chest x-ray on January 26, 2011 - right IJ central venous     catheter tip in the upper SVC.  No pneumothorax. 14.Anterior cervical decompression at C3-C4 and C4-5 and arthrodesis     with structural allograft at C3-4 and C4-C5, anterior plate     fixation at C3-C5, and Alphatec plate and Trestle grew by Dr.     Danielle Dess on January 26, 2011.  CONSULTATIONS: 1. Neurology - Dr. Thad Ranger. 2. Neurosurgery - Dr. Danielle Dess.  HISTORY: The patient is a  pleasant 50 year old black female with the above listed medical problems who presented with generalized weakness and numbness in her arms and legs.  Please see the dictated admission history and physical for the details of the admission history, physical exam, and the laboratory data.  HOSPITAL COURSE: 1. Cervical spondylosis with herniated nucleus pulposus and myelopathy     at the C3-C4 and C4-5 - as discussed above.  Following admission,     Neurology was consulted and Dr. Thad Ranger saw the patient and     indicated that she did not find any localizing or lateralizing     weakness other than possibly mild increase in weakness on her left     biceps and left hip flexor and recommended an MRI of brain and MRI     of spine to rule out any intracranial or cervical spine pathology.     She also recommended PT/OT consultation and this was done.  The MRI     of the cervical spine revealed the above findings consistent with a     cervical spondylosis with myelopathy and resulting quadriparesis     and Neurosurgery was consulted and Dr. Danielle Dess saw the patient and     since she was on chronic anticoagulation for a history of pulmonary     embolus he recommended to start her on steroids to help reduce the     acute cord edema seen in the upper portion of the canal.  He also     recommended that the patient have an IVC filter placed with     bridging Lovenox, and the patient's surgery was scheduled for     January 26, 2011.  She reported improvement in her weakness while on     the steroids.  Dr. Danielle Dess followed up and on January 26, 2011, the     surgery was done as stated above and the patient tolerated the     procedure well and PT/OT followed up with the patient today and     recommended CIR consultation for further rehab and this has been     done. 2. Quadriparesis - as discussed above, secondary to cervical     spondylosis. status post surgery. 3. Hypertension - the patient was maintained on  her outpatient     medications during this hospital stay, and her blood pressures were     well controlled.  4. Obstructive sleep apnea - CPAP was ordered while the patient was in     the hospital. 5. History of PE - on chronic Coumadin.  As discussed above, the     patient had an IVC filter placed on January 23, 2011, as recommended     per Neurosurgery.  Her Coumadin was held and she was placed on     bridging Lovenox.  Neurosurgery to follow and recommend when to     resume anticoagulation/Coumadin. 6. Leukocytosis - the patient was noted to have a white cell count of     12.9 today and she is afebrile and hemodynamically stable.  The     impression is that it is likely secondary to the steroids that she     has been on and we will obtain a repeat urinalysis on followup. Her chronic medical conditions remained stable during this hospital stay and she was maintained on her outpatient medications except as indicated above.  The discharging physician to dictate the rest of her hospital course as well as the final discharge medications.     Kela Millin, M.D.     ACV/MEDQ  D:  01/27/2011  T:  01/27/2011  Job:  [id number]  Electronically Signed by Donnalee Curry M.D. on 01/30/2011 09:43:46 PM

## 2011-01-31 LAB — CBC
HCT: 34.2 % — ABNORMAL LOW (ref 36.0–46.0)
Hemoglobin: 11.7 g/dL — ABNORMAL LOW (ref 12.0–15.0)
MCH: 27.5 pg (ref 26.0–34.0)
MCHC: 34.2 g/dL (ref 30.0–36.0)
MCV: 80.3 fL (ref 78.0–100.0)
Platelets: 93 10*3/uL — ABNORMAL LOW (ref 150–400)
RBC: 4.26 MIL/uL (ref 3.87–5.11)
RDW: 15.4 % (ref 11.5–15.5)
WBC: 9.2 10*3/uL (ref 4.0–10.5)

## 2011-01-31 LAB — GLUCOSE, CAPILLARY
Glucose-Capillary: 93 mg/dL (ref 70–99)
Glucose-Capillary: 115 mg/dL — ABNORMAL HIGH (ref 70–99)
Glucose-Capillary: 95 mg/dL (ref 70–99)
Glucose-Capillary: 95 mg/dL (ref 70–99)

## 2011-01-31 LAB — PROTIME-INR
INR: 1.42 (ref 0.00–1.49)
Prothrombin Time: 17.6 seconds — ABNORMAL HIGH (ref 11.6–15.2)

## 2011-02-01 LAB — PROTIME-INR
INR: 1.85 — ABNORMAL HIGH (ref 0.00–1.49)
Prothrombin Time: 21.7 seconds — ABNORMAL HIGH (ref 11.6–15.2)

## 2011-02-01 LAB — GLUCOSE, CAPILLARY
Glucose-Capillary: 86 mg/dL (ref 70–99)
Glucose-Capillary: 97 mg/dL (ref 70–99)
Glucose-Capillary: 100 mg/dL — ABNORMAL HIGH (ref 70–99)
Glucose-Capillary: 111 mg/dL — ABNORMAL HIGH (ref 70–99)

## 2011-02-02 LAB — GLUCOSE, CAPILLARY
Glucose-Capillary: 91 mg/dL (ref 70–99)
Glucose-Capillary: 107 mg/dL — ABNORMAL HIGH (ref 70–99)
Glucose-Capillary: 97 mg/dL (ref 70–99)
Glucose-Capillary: 95 mg/dL (ref 70–99)

## 2011-02-02 LAB — PROTIME-INR
INR: 1.86 — ABNORMAL HIGH (ref 0.00–1.49)
Prothrombin Time: 21.8 seconds — ABNORMAL HIGH (ref 11.6–15.2)

## 2011-02-03 LAB — BASIC METABOLIC PANEL
BUN: 14 mg/dL (ref 6–23)
CO2: 26 mEq/L (ref 19–32)
Calcium: 8.6 mg/dL (ref 8.4–10.5)
Chloride: 109 mEq/L (ref 96–112)
Creatinine, Ser: 0.77 mg/dL (ref 0.50–1.10)
GFR calc Af Amer: >90 mL/min (ref >90)
GFR calc non Af Amer: >90 mL/min (ref >90)
Glucose, Bld: 95 mg/dL (ref 70–99)
Potassium: 3.8 mEq/L (ref 3.5–5.1)
Sodium: 141 mEq/L (ref 135–145)

## 2011-02-03 LAB — CBC
HCT: 31.8 % — ABNORMAL LOW (ref 36.0–46.0)
Hemoglobin: 10.8 g/dL — ABNORMAL LOW (ref 12.0–15.0)
MCH: 27.3 pg (ref 26.0–34.0)
MCHC: 34 g/dL (ref 30.0–36.0)
MCV: 80.5 fL (ref 78.0–100.0)
Platelets: 84 10*3/uL — ABNORMAL LOW (ref 150–400)
RBC: 3.95 MIL/uL (ref 3.87–5.11)
RDW: 15.2 % (ref 11.5–15.5)
WBC: 7.3 10*3/uL (ref 4.0–10.5)

## 2011-02-03 LAB — GLUCOSE, CAPILLARY
Glucose-Capillary: 82 mg/dL (ref 70–99)
Glucose-Capillary: 78 mg/dL (ref 70–99)
Glucose-Capillary: 105 mg/dL — ABNORMAL HIGH (ref 70–99)
Glucose-Capillary: 232 mg/dL — ABNORMAL HIGH (ref 70–99)

## 2011-02-03 LAB — PROTIME-INR
INR: 2.19 — ABNORMAL HIGH (ref 0.00–1.49)
Prothrombin Time: 24.7 seconds — ABNORMAL HIGH (ref 11.6–15.2)

## 2011-02-04 DIAGNOSIS — M5000 Cervical disc disorder with myelopathy, unspecified cervical region: Secondary | ICD-10-CM

## 2011-02-04 LAB — GLUCOSE, CAPILLARY
Glucose-Capillary: 166 mg/dL — ABNORMAL HIGH (ref 70–99)
Glucose-Capillary: 134 mg/dL — ABNORMAL HIGH (ref 70–99)
Glucose-Capillary: 122 mg/dL — ABNORMAL HIGH (ref 70–99)

## 2011-02-04 LAB — PROTIME-INR
INR: 2.57 — ABNORMAL HIGH (ref 0.00–1.49)
Prothrombin Time: 28 seconds — ABNORMAL HIGH (ref 11.6–15.2)

## 2011-02-05 LAB — GLUCOSE, CAPILLARY
Glucose-Capillary: 102 mg/dL — ABNORMAL HIGH (ref 70–99)
Glucose-Capillary: 117 mg/dL — ABNORMAL HIGH (ref 70–99)
Glucose-Capillary: 113 mg/dL — ABNORMAL HIGH (ref 70–99)
Glucose-Capillary: 100 mg/dL — ABNORMAL HIGH (ref 70–99)
Glucose-Capillary: 81 mg/dL (ref 70–99)

## 2011-02-05 LAB — PROTIME-INR
INR: 3.11 — ABNORMAL HIGH (ref 0.00–1.49)
Prothrombin Time: 32.5 seconds — ABNORMAL HIGH (ref 11.6–15.2)

## 2011-02-06 LAB — GLUCOSE, CAPILLARY
Glucose-Capillary: 81 mg/dL (ref 70–99)
Glucose-Capillary: 73 mg/dL (ref 70–99)
Glucose-Capillary: 65 mg/dL — ABNORMAL LOW (ref 70–99)
Glucose-Capillary: 80 mg/dL (ref 70–99)
Glucose-Capillary: 91 mg/dL (ref 70–99)

## 2011-02-06 LAB — CBC
HCT: 35.1 % — ABNORMAL LOW (ref 36.0–46.0)
Hemoglobin: 11.8 g/dL — ABNORMAL LOW (ref 12.0–15.0)
MCH: 26.9 pg (ref 26.0–34.0)
MCHC: 33.6 g/dL (ref 30.0–36.0)
MCV: 80.1 fL (ref 78.0–100.0)
Platelets: 101 10*3/uL — ABNORMAL LOW (ref 150–400)
RBC: 4.38 MIL/uL (ref 3.87–5.11)
RDW: 15 % (ref 11.5–15.5)
WBC: 10.9 10*3/uL — ABNORMAL HIGH (ref 4.0–10.5)

## 2011-02-06 LAB — PROTIME-INR
INR: 3.11 — ABNORMAL HIGH (ref 0.00–1.49)
Prothrombin Time: 32.5 seconds — ABNORMAL HIGH (ref 11.6–15.2)

## 2011-02-07 DIAGNOSIS — M5000 Cervical disc disorder with myelopathy, unspecified cervical region: Secondary | ICD-10-CM

## 2011-02-07 DIAGNOSIS — M1710 Unilateral primary osteoarthritis, unspecified knee: Secondary | ICD-10-CM

## 2011-02-07 LAB — GLUCOSE, CAPILLARY
Glucose-Capillary: 83 mg/dL (ref 70–99)
Glucose-Capillary: 123 mg/dL — ABNORMAL HIGH (ref 70–99)
Glucose-Capillary: 69 mg/dL — ABNORMAL LOW (ref 70–99)
Glucose-Capillary: 101 mg/dL — ABNORMAL HIGH (ref 70–99)
Glucose-Capillary: 150 mg/dL — ABNORMAL HIGH (ref 70–99)

## 2011-02-07 LAB — PROTIME-INR
INR: 2.82 — ABNORMAL HIGH (ref 0.00–1.49)
Prothrombin Time: 30.1 seconds — ABNORMAL HIGH (ref 11.6–15.2)

## 2011-02-08 LAB — PROTIME-INR
INR: 2.41 — ABNORMAL HIGH (ref 0.00–1.49)
Prothrombin Time: 26.6 seconds — ABNORMAL HIGH (ref 11.6–15.2)

## 2011-02-08 LAB — GLUCOSE, CAPILLARY
Glucose-Capillary: 81 mg/dL (ref 70–99)
Glucose-Capillary: 90 mg/dL (ref 70–99)
Glucose-Capillary: 92 mg/dL (ref 70–99)
Glucose-Capillary: 97 mg/dL (ref 70–99)

## 2011-02-08 NOTE — Consult Note (Signed)
NAMESKYLAR, [name] NO.:  [id number]  MEDICAL RECORD NO.:  [id number]  LOCATION:  5015                         FACILITY:  [facility]  PHYSICIAN:  Thana Farr, MD    DATE OF BIRTH:  1960/08/30  DATE OF CONSULTATION:  01/19/2011 DATE OF DISCHARGE:                                CONSULTATION   REASON FOR CONSULTATION:  Bilateral lower extremity neuropathy and bilateral upper extremity neuropathy x2 weeks.  HISTORY OF PRESENT ILLNESS:  This is a 50 year old African American female who noted approximately 2 weeks ago that she started to have tingling in her fingers along with weakness in her left arm.  This was a slowly progressive onset and it was not sudden.  The patient states she has seen a neurologist in the past for this problem, but she cannot give me the name of the neurologist.  Apparently, this neurologist works in [place] and also has a Midwife who he works with.  She feels as though at that time, she was told she could possibly knee surgery, but cannot recall majority of the details.  The patient states that this is slightly different as prior, it was only her left arm and at this episode now involved bilateral hands.  Two weeks ago, she noted the bilateral fingertip tingling which then progressed to her hands.  This dissipated in approximately a few days.  She went to see a chiropractor who believed it was her blood pressure.  Approximately 1 week ago, the patient then noted that she was generalized weak throughout. Approximately 2 days ago, the patient noted that bilateral hands felt as though he had pins and needles and again, she felt generally weak.  She did not recall any numbness, tingling, difficulty with bowel or bladder in her lower extremities.  Apparently, she fell at home and was brought to the emergency department.  In the emergency department, the patient initially had a CT of her head and C-spine which were normal.  They  also obtained an MRI of her L-spine secondary to her lower extremity weakness which showed no significant stenosis.  The patient's labs while in the hospital showed TSH of 2.962, T3 of 2.7, B12 greater than 2000, a UA which was negative and a CK which was normal.  Neurology was asked to see the patient for further evaluation of lower extremity weakness and tingling in bilateral hands.  PAST MEDICAL HISTORY: 1. Obstructive sleep apnea. 2. Hypertension. 3. Diabetes. 4. Asthma. 5. Obesity with gastric bypass in February 2010. 6. 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 7. IVC filter placed. 8. GERD. 9. Glaucoma.  MEDICATIONS:  She is on Diovan, Xanax, Lumigan, Alphagan, Azopt, Os-Cal, Celebrex, cyanocobalamin, Flexeril, diltiazem, hydrochlorothiazide, Claritin, magnesium chloride, Protonix, K-Dur, quinine, sulfate, Coumadin.  ALLERGIES:  She allergic to PENICILLIN, BETA-BLOCKERS, and SULFA.  SOCIAL HISTORY:  She does not smoke, drink, or do illicit drugs.  REVIEW OF SYSTEMS:  Negative with the exception above.  PHYSICAL EXAMINATION:  VITAL SIGNS:  Blood pressure is 117/82, pulse 58, respirations 18, temperature 98.0. NEUROLOGIC:  She is alert and oriented x3, carries out 2-3-step commands without difficulty.  Pupils are equal, round, and reactive to light and  accommodating, conjugate.  Extraocular movements are intact.  Visual fields grossly intact.  Face symmetrical, tongue is midline, uvula is midline.  The patient shows no dysarthria or aphasia.  Facial sensation is full to pinprick, light touch.  Shoulder shrug, head turn is within normal limits.  Coordination:  Finger-to-nose is smooth, heel-to-shin is smooth.  Gait:  The patient walks with 2 person assist, shuffled gait.  Motor:  The patient shows generalized weakness throughout with a 4/5. She does not show any increased tone, no tremor, no spasticity.  Deep tendon reflexes are 2+ throughout.  She had downgoing toes  bilaterally.  Sensation:  The patient's sensory exam was very inconsistent. Initially, she had stated she had a subjective sensation of decreased sensation in bilateral hands.  However, on exam, I noted that she states her left fingers were more sensitive than her left dorsum of her hand which was less sensitive.  In addition, her forearm was more sensitive than the dorsum of her hand in the forearm, but then became more sensitive about midway of her biceps and then again less sensitive in her deltoid.  On her right hand and arm, she states that she had decreased sensation.  Along her sternum, she split midline sensation. She states that she has no decreased sensation throughout her lower extremities. PULMONARY:  Clear to auscultation. CARDIOVASCULAR:  S1, S2. NECK:  Negative for bruits.  LABORATORY DATA:  As noted above.  In addition, she had a CBC showing a white blood cell count of 5.7, platelets of 135, hemoglobin 12.7, hematocrit 37.0.  IMAGING TEST:  As above.  ASSESSMENT/PLAN:  This is a 50 year old African American female with 2-week history of generalized weakness and tingling that waxes and wanes.  She describes that she may have been told she has a history of cervical pathology, but she is very unclear on this.  At this time on exam, other than inconsistent sensory exam, I do not know any significant localizing or lateralizing weakness other than possibly mild increase in weakness on her left biceps and left hip flexor.  At this time, we would recommend MRI of brain and MRI of C-spine to rule out any intracranial of cervical spine pathology.  We would recommend PT and OT.  We would also recommend outpatient EMG nerve conduction study.  I will discuss these findings with Dr. Thad Ranger and make further recommendations.     Felicie Morn, PA-C   ______________________________ Thana Farr, MD    DS/MEDQ  D:  01/19/2011  T:  01/19/2011  Job:   [id number]  Electronically Signed by Felicie Morn PA-C on 01/20/2011 12:18:22 PM Electronically Signed by Thana Farr MD on 02/08/2011 09:32:42 AM

## 2011-02-09 DIAGNOSIS — M1710 Unilateral primary osteoarthritis, unspecified knee: Secondary | ICD-10-CM

## 2011-02-09 DIAGNOSIS — M5000 Cervical disc disorder with myelopathy, unspecified cervical region: Secondary | ICD-10-CM

## 2011-02-09 LAB — GLUCOSE, CAPILLARY
Glucose-Capillary: 85 mg/dL (ref 70–99)
Glucose-Capillary: 110 mg/dL — ABNORMAL HIGH (ref 70–99)
Glucose-Capillary: 88 mg/dL (ref 70–99)
Glucose-Capillary: 81 mg/dL (ref 70–99)

## 2011-02-09 LAB — PROTIME-INR
INR: 2.15 — ABNORMAL HIGH (ref 0.00–1.49)
Prothrombin Time: 24.4 seconds — ABNORMAL HIGH (ref 11.6–15.2)

## 2011-02-10 LAB — PROTIME-INR
INR: 1.9 — ABNORMAL HIGH (ref 0.00–1.49)
Prothrombin Time: 22.1 seconds — ABNORMAL HIGH (ref 11.6–15.2)

## 2011-02-10 LAB — GLUCOSE, CAPILLARY
Glucose-Capillary: 77 mg/dL (ref 70–99)
Glucose-Capillary: 78 mg/dL (ref 70–99)
Glucose-Capillary: 104 mg/dL — ABNORMAL HIGH (ref 70–99)

## 2011-02-11 LAB — GLUCOSE, CAPILLARY
Glucose-Capillary: 85 mg/dL (ref 70–99)
Glucose-Capillary: 97 mg/dL (ref 70–99)
Glucose-Capillary: 80 mg/dL (ref 70–99)
Glucose-Capillary: 95 mg/dL (ref 70–99)

## 2011-02-11 LAB — PROTIME-INR
INR: 1.96 — ABNORMAL HIGH (ref 0.00–1.49)
Prothrombin Time: 22.7 seconds — ABNORMAL HIGH (ref 11.6–15.2)

## 2011-02-12 LAB — GLUCOSE, CAPILLARY
Glucose-Capillary: 76 mg/dL (ref 70–99)
Glucose-Capillary: 100 mg/dL — ABNORMAL HIGH (ref 70–99)

## 2011-02-12 LAB — PROTIME-INR
INR: 2.21 — ABNORMAL HIGH (ref 0.00–1.49)
Prothrombin Time: 24.9 seconds — ABNORMAL HIGH (ref 11.6–15.2)

## 2011-02-16 ENCOUNTER — Ambulatory Visit: Payer: BC Managed Care – PPO | Attending: Physical Medicine & Rehabilitation | Admitting: Physical Therapy

## 2011-02-16 DIAGNOSIS — R293 Abnormal posture: Secondary | ICD-10-CM | POA: Insufficient documentation

## 2011-02-16 DIAGNOSIS — M6281 Muscle weakness (generalized): Secondary | ICD-10-CM | POA: Insufficient documentation

## 2011-02-16 DIAGNOSIS — M2569 Stiffness of other specified joint, not elsewhere classified: Secondary | ICD-10-CM | POA: Insufficient documentation

## 2011-02-16 DIAGNOSIS — IMO0001 Reserved for inherently not codable concepts without codable children: Secondary | ICD-10-CM | POA: Insufficient documentation

## 2011-02-16 DIAGNOSIS — R262 Difficulty in walking, not elsewhere classified: Secondary | ICD-10-CM | POA: Insufficient documentation

## 2011-02-16 DIAGNOSIS — M545 Low back pain, unspecified: Secondary | ICD-10-CM | POA: Insufficient documentation

## 2011-02-18 ENCOUNTER — Ambulatory Visit: Payer: BC Managed Care – PPO | Admitting: Rehabilitative and Restorative Service Providers"

## 2011-02-18 NOTE — Op Note (Signed)
[name], [name] NO.:  [id number]  MEDICAL RECORD NO.:  [id number]  LOCATION:  5015                         FACILITY:  [facility]  PHYSICIAN:  Stefani Dama, M.D.  DATE OF BIRTH:  09/02/1960  DATE OF PROCEDURE:  01/26/2011 DATE OF DISCHARGE:                              OPERATIVE REPORT   PREOPERATIVE DIAGNOSES: 1. Cervical spondylosis plus herniated nucleus pulposus with     myelopathy, C3-4 and C4-5. 2. Quadriparesis. 3. Morbid obesity. 4. Diabetes mellitus.  POSTOPERATIVE DIAGNOSES: 1. Cervical spondylosis plus herniated nucleus pulposus with     myelopathy, C3-4 and C4-5. 2. Quadriparesis. 3. Morbid obesity. 4. Diabetes mellitus.  PROCEDURES:  Anterior cervical decompression, C3-4 and C4-5; arthrodesis with structural allograft, C3-4 and C4-5; anterior plate fixation, C3 to C5 with Alphatec plate and trestle screws.  SURGEON:  Stefani Dama, MD.  FIRST ASSISTANT:  Cristi Loron, MD  ANESTHESIA:  General endotracheal.  INDICATIONS:  [name] is a 50 year old individual who had developed progressive difficulty with walking and a number of falls about 2-1/2 to 3-week period of time.  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 She was placed in the hospital, given some physical therapy and because she had been on Coumadin anticoagulation for recurrent pulmonary emboli, she required placement of a vena cava filter followed by weaning her Coumadin anticoagulation.  She was prepared for surgical intervention today and was taken to the operating room.  It was noted on her preoperative exam that she had weakness involving her left upper extremity, and mostly her left lower extremity; however; she had give- way weakness with a scissor type of gait in the lower extremities that was witnessed by many individuals.   The patient is taken to the operating room at this time to undergo surgical decompression.  PROCEDURE:  The patient was brought to the operating room supine on the stretcher.  She was moved to the operating table and then after the smooth induction of general endotracheal anesthesia, she was placed in 5 pounds of halter traction.  Neck was prepped with alcohol and DuraPrep, and draped in a sterile fashion.  Traction was placed on the skin to allow exposure of the superior skin fold under the chin in the superior portion of the neck.  Transverse incision was created in this area and this was taken down to the platysma.  The plane between the sternocleidomastoid and the strap muscles was then dissected bluntly until the prevertebral space was reached.  The 1st identifiable disk space was identified as that of C3-4 on the localizing radiographs, then by stripping the longus coli muscles, self-retaining retractor could be placed under the muscle to expose the disk space.  A 15 blade was used to open the ventral aspect of the disk space and rongeur was used to remove some ventral osteophytes and enter the disk space.  The significant quantity of severely degenerating desiccated disk material was removed from this area.  Care was taken to remove the endplate material also and as the region of posterior longitudinal ligament was reached,  there was noted to be a substantial amount of disk material under the vertebral body and under the ligament, in the subligamentous space.  This was decompressed and then ultimately the posterior longitudinal ligament was opened.  Dissection was carried out to either lateral gutter, and ultimately the lateral recesses and the nerve roots were well decompressed.  Once this was achieved, then hemostasis in the lateral gutters was achieved meticulously using a bipolar cautery and some small pledgets of Gelfoam soaked in thrombin.  The endplates were then shaved  smoothed with a 4-mm round barrel bit and then a 6-mm TranZgraft, was shaped to the appropriate size and configuration, filled with demineralized bone matrix and fitted into the interspace at C3-C4. Attention was then turned to C4-C5.  Here, there were much more prominent ventral osteophytes, which required a Leksell rongeur to remove and this uncovered the disk space.  The longus coli muscle was again stripped off either side of midline to allow placement of the self- retaining Caspar-type retractor and then a diskectomy ensued in a similar fashion.  Here, again the subligamentous herniation of disk was encountered along with some fairly prominent bony osteophytes from the inferior margin of body of C4 and superior margin body of C5.  These were drilled down with a 2-mm bit and a high-speed bur.  The dissection yielded more fuller decompression of the interspace, particularly on the left side of the cord where there was substantial compression on the imaging studies.  With the lateral recesses being well decompressed, the end plates were again shaped with a 4-mm barrel bit and again a 4-mm TranZgraft was fitted to the ventral aspect of the vertebral bodies, filled with demineralized bone matrix and placed into the interspace. The lateral gutters were then filled with the remainder of the demineralized bone matrix and a traction was removed at this time and a 32-mm standard size Alphatec plate of the trestle variety was fitted to the ventral aspect of the vertebral bodies and secured with 6 locking, 12 x 4 mm screws.  With the screws being placed and secured, the wound was irrigated copiously with antibiotic irrigating solution.  Hemostasis in the soft tissues was meticulously obtained.  Then, the platysma was closed with 3-0 Vicryl in interrupted fashion and 3-0 Vicryl was used to close the subcuticular skin.  Skin was dressed with Dermabond.  The patient tolerated procedure well.  Blood  loss was estimated less than 100 mL.  She was returned to recovery room in stable condition.     Stefani Dama, M.D.     [name]  D:  01/26/2011  T:  01/27/2011  Job:  [id number]  Electronically Signed by Barnett Abu M.D. on 02/18/2011 07:04:00 AM
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
## 2011-02-18 NOTE — H&P (Signed)
[name], [name] NO.:  [id number]  MEDICAL RECORD NO.:  [id number]  LOCATION:  4005                         FACILITY:  [facility]  PHYSICIAN:  Ranelle Oyster, M.D.DATE OF BIRTH:  [birth year]  DATE OF ADMISSION:  01/28/2011 DATE OF DISCHARGE:                             HISTORY & PHYSICAL   CHIEF COMPLAINT:  Weakness in all 4 extremities.  PRIMARY CARE PROVIDER:  [name], MD  HISTORY OF PRESENT ILLNESS:  This is a 50 year old African American female with diabetes, history of falls for 2 weeks prior to arrival on January 18, 2011.  She was evaluated by Neurology and MRI of the C- spine showed spinal stenosis C3-C4 and C4-C5 with cord compression.  She was started on IV Decadron.  Neurosurgery was consulted and surgery was recommended.  Her Coumadin was reversed, and an IVC filter was placed as she did have a history of PE.  She underwent ACDF at C3, C4 through C5 on January 26, 2011, by Dr. Danielle Dess.  Postoperatively, she has had improvement of her symptoms but still had a bit of difficulty with balance and mobility.  Rehab was consulted and felt she could benefit from inpatient rehab stay.  REVIEW OF SYSTEMS:  Notable for the above.  She does report occasional sore throat, pain in the right leg.  Full 12-point review is in the written H and P.  The patient also reports some knee pain left greater than right.  PAST MEDICAL HISTORY:  Positive for: 1. Diabetes type 2 diet controlled. 2. Glaucoma with right eye surgery. 3. Asthma. 4. Obstructive sleep apnea. 5. Morbid obesity. 6. Gastric bypass. 7. Recurrent PE DVT left lower extremity. 8. GERD. 9. History of arthroscopy left knee scope. 10.Dysfunctional uterine bleeding. 11.Hypertension.  FAMILY HISTORY:  Positive for glaucoma, CVA, and brain aneurysm.  SOCIAL HISTORY:  The patient lives alone in one-level house with 4 steps to enter.  She works as a Runner, broadcasting/film/video in [employer], does not smoke or  drink.  ALLERGIES:  PENICILLIN, BETA-BLOCKER and SULFA.  LABORATORIES AND PRIOR MEDICATIONS:  Please see written H and P.  PHYSICAL EXAMINATION:  VITAL SIGNS:  Blood pressure is 141/79, pulse 60, respiratory rate 18, temperature 98.5.  GENERAL:  The patient is pleasant, morbidly obese sitting in bed. HEENT:  Pupils equal, round, reactive to light.  Ear, nose, and throat notable for braces, but otherwise dentition is appropriate.  She had some mild amount of thrush over the tongue.  NECK:  Supple.  She has central line right neck incision anteriorly is clean and intact.  CHEST: Clear to auscultation bilaterally without wheezes, rales, or rhonchi. HEART:  Regular rate and rhythm without murmur, rubs, or gallops. ABDOMEN:  Soft, nontender.  Bowel sounds are positive. SKIN:  Is as noted above. NEUROLOGIC:  Cranial nerves II-XII grossly intact.  Reflexes 1+. Sensation diminished distal to the MCPs of either hand.  Lower extremity sensation is within normal limits.  Strength is 4/5 right upper extremity proximally to 4-/5 distally.  Left upper extremity she is  3+ to 4- distally.  Lower extremity strength is 3+ to 4 proximally to 4+/5 distally.  Left lower extremity she is  3 to 3+ proximally and 4/5 distally.  POST ADMISSION PHYSICIAN EVALUATION: 1. Functional deficit secondary to C3-C4 and C4-C5 myelopathy status     post ACDF. 2. The patient is admitted to receive collaborative interdisciplinary     care between the physiatrist, rehab nursing staff, and therapy     team. 3. The patient's level of medical complexity and substantial therapy     needs in context of that medical necessity cannot be provided at a     lesser intensity of care. 4. The patient has experienced substantial functional loss from her     baseline.  Premorbidly, she is independent teaching.  Currently,     she is mod assist bed mobility, total assist to max assist for     transfers, min assist 75 feet with rolling  walker, min assist upper     body care, max assist lower body care.  Judging by the patient's     diagnosis, physical exam, and functional history, the patient has     potential for functional progress which will result in measurable     gains while in inpatient rehab. 5. The physiatrist will provide 24-hour management of medical needs as     well as oversight of the therapy plan/treatment and provide     guidance as appropriate regarding interaction of the two.  Medical     problem list and plan are below. 6. A 24-hour rehab nursing team will assist in the management of the     patient's skin care needs as well as pain, nutrition, bowel and     bladder function. 7. PT will assess and treat for lower extremity strength, range of     motion, adaptive equipment, adaptive techniques, patient safety,     and education with goals modified independent. 8. OT will assess and treat for upper extremity use, ADLs, adaptive     techniques, equipment, functional mobility and safety,     neuromuscular education with goals modified independent to set up. 9. Case Management and Social Worker will assess and treat for     psychosocial issues and discharge planning. 10.Team conference will be held weekly to assess progress towards     goals and to determine barriers at discharge. 11.The patient has demonstrated sufficient medical stability and     exercise capacity to tolerate at least 3 hours of therapy per day     at least 5 days per week. 12.Estimate length of stay is 7-10 days.  Prognosis is good.  The     patient is quite motivated.  MEDICAL PROBLEM LIST AND PLAN: 1. DVT prophylaxis with SCDs until Coumadin is therapeutic.  Most     recent INR is pending  and Coumadin was just restarted     postoperatively. 2. Acute renal failure:  Likely secondary to dye effects during     fluoro.  Push fluids and we will follow BUN and creatinine     serially.  Most recent creatinine 1.37. 3. History of  recurrent PE.  The patient has IVC filter in place.     Coumadin as above noted. 4. Diabetes type 2.  This is controlled at present.  Monitor with     increased diet. 5. Obstructive sleep apnea:  CPAP at bedtime.  Encourage up out of bed     and deep breathing techniques. 6. Leukocytosis:  Urinalysis is negative.  We will get urine culture.     No active signs of infection on examination.  Encouraged incentive     spirometry.     Ranelle Oyster, M.D.     ZTS/MEDQ  D:  01/28/2011  T:  01/28/2011  Job:  [id number]  cc:   [name], M.D.  Electronically Signed by Faith Rogue M.D. on 02/18/2011 09:36:41 AM

## 2011-02-22 NOTE — Discharge Summary (Signed)
NAMEBENNA, Erin NO.:  [id number]  MEDICAL RECORD NO.:  [id number]  LOCATION:  4005                         FACILITY:  [facility]  PHYSICIAN:  Ranelle Oyster, M.D.DATE OF BIRTH:  12-11-1960  DATE OF ADMISSION:  01/28/2011 DATE OF DISCHARGE:  02/12/2011                              DISCHARGE SUMMARY   DISCHARGE DIAGNOSES: 1. 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 2. Hypertension. 3. Acute renal failure resolved. 4. Diabetes mellitus type 2, diet controlled.  HISTORY OF PRESENT ILLNESS:  [name] is a 50 year old female with history of DM, obstructive sleep apnea, sudden onset of falls due to left upper extremity weakness and bilateral lower extremity weakness for 2 weeks prior to admission September 30.  She was evaluated by Neurology and MRI of C-spine showed spinal stenosis C3-C4 greater than C4-C5 with cord compression.  The patient started on IV Decadron by the hospitalist.  Neurosurgery was consulted for input and surgery was recommended.  The patient on chronic Coumadin with history of recurrent PEs, IVC filter was placed and once Coumadin normalized, the patient underwent ACDF C3-C4 and C4-C5 on October 8 by Dr. Danielle Dess.  Has had improvement in bilateral upper extremity weakness and neuropathy.  She was cleared to start anticoagulation today.  Therapies were initiated and the patient continues to be limited by weakness in bilateral upper and lower extremity requiring cues for transitional movement.  Also noted to have issues with left knee hyperextension requiring support. The patient was evaluated by rehab and we felt that she would benefit from a CIR program.  PAST MEDICAL HISTORY:  Significant for DM type 2, diet controlled, glaucoma with surgery right eye, asthma, obstructive sleep apnea, morbid obesity, gastric bypass, history of recurrent PEs and DVT, left lower extremity, GERD, hysteroscopy, left knee scope, dysfunctional  uterine bleeding, and hypertension.  ALLERGIES:  PENICILLIN, BETA-BLOCKER, and SULFA.  FAMILY HISTORY:  Positive for glaucoma, CVA, and brain aneurysm.  REVIEW OF SYMPTOMS:  Positive for weakness as well as some numbness in right upper extremity.  The patient also complaining of knee pain, left greater than right.  SOCIAL HISTORY:  The patient lives alone in 1 level home with 4 steps at entry.  Works as a Runner, broadcasting/film/video, Optometrist at [employer].  Does not use any tobacco or alcohol.  A brother from out of town to assist past discharge.  FUNCTIONAL HISTORY:  The patient was independent and driving prior to admission.  FUNCTIONAL STATUS:  The patient is mod assist bed mobility, +2 total assist, 40% to max assist for transfers, min assist for ambulating 75 feet with rolling walker.  PHYSICAL EXAMINATION:  VITALS:  Blood pressure 141/79, pulse 60, respiratory rate 16, temperature 98.5. GENERAL:  The patient is morbidly obese female, pleasant, alert, no acute distress. HEENT:  Pupils equal, round, reactive to light.  Oral mucosa is pink and moist with braces in place.  The patient with mild thrush on tongue. NECK:  Supple without JVD or lymphadenopathy.  Central line in right neck. LUNGS:  Clear to auscultation bilaterally without wheezes, rales, or rhonchi. HEART:  Regular rate and rhythm without murmurs, gallops, or rubs. ABDOMEN:  Soft,  nontender.  Positive bowel sounds. SKIN:  Neck incision is clean, dry, and intact, healing well. EXTREMITIES:  No edema or cyanosis.  PICC line in right upper extremity. NEUROLOGIC:  Cranial nerves II-XII grossly intact.  Reflexes 1+. Sensation diminished distal to MCPs of either hand.  Lower extremity sensation is within normal limits.  Strength is 4/5 right upper extremity proximal and 4-/5 distally.  Left upper extremity, she is 3+ to 4- proximal to distal.  Lower extremity strength is 3+ to 4, proximally 4+/5 distally.  Left lower  extremity strength is 3 to 3+ proximally and 4/5 distally.  HOSPITAL COURSE:  [name] was admitted to rehab on January 28, 2011 for inpatient therapies to consist of PT, OT 3 hours 5 days a week. Past admission physiatrist, rehab, RN, and therapy team have worked together to provide customized collaborative interdisciplinary care. Rehab RN has worked with the patient on bowel and bladder program as well as assisted with administration of pain meds to help the patient with participation in tolerance and therapies.  The patient was started on low-dose Neurontin for her neuropathic pain.  The patient was noted to have acute renal failure at time of admission with BUN of 29, creatinine at 1.37.  Labs of October 2011 revealed sodium 142, potassium 3.8, chloride 109, CO2 25, BUN 21, creatinine 0.99, glucose 80.  UA/UC was done past admission.  UA was negative.  Urine culture, however, grew out greater than 100,000 colonies of Proteus mirabilis.  The patient was treated with Cipro for this.  The patient had complaints of bilateral knee pain with activity and these were injected with steroids on October 17 with improvement in pain symptoms.  Knee sleeve was also ordered for left knee to help with instability issues.  The patient's blood sugars checked on a.c. and h.s. basis.  These are well controlled ranging from 80s to 110 range.  During patient's stay in rehab, weekly team conferences were held to monitor the patient's progress, set goals, as well as discuss barriers to discharge.  At time of admission, the patient was limited by decreased balance, decrease in functional mobility, and decreased activity tolerance with generalized weakness, left greater than right. She was initially total assist +2, total assist to stand, max assist for scoot transfers.  She has made good progress and is currently at modified independent level for all transfers and mobility.  A left lower extremity  hinge brace has been effective in preventing left lower extremity buckling.  The patient to continue with further followup outpatient, physical therapy for progressive therapies.  OT has worked with the patient on self-care tasks.  At admission, the patient was min to mod assist with BADLs and mod to max assist for transfers.  She has made good progress and is currently independent for upper body dressing. She is modified independent for bathing, lower body dressing, toilet transfers, toileting and tub transfers.  She is also modified independent for simple meal preparation tasks.  No further OT needs past discharge.  On February 12, 2011, the patient is discharged to home.  DISCHARGE MEDICATIONS: 1. Coumadin 10 mg Wednesday, Saturday, Sunday, and 5 mg all other     days. 2. Xanax 0.5 mg at bedtime. 3. Claritin 10 mg p.o. per day. 4. Vitamin B 500 mcg p.o. per day. 5. Lumigan 0.03% 1 GTT OS at bedtime. 6. Multivitamin per day. 7. Mucinex LA 1200 mg p.o. b.i.d. 8. Flexeril 10 mg p.o. at bedtime. 9. Trusopt 2% 1  GGT OS t.i.d. 10.Quinine sulfate 325 mg p.o. at bedtime. 11.Niacin ER 500 mg p.o. per day. 12.Diovan 80 mg p.o. per day. 13.Cardizem CD 240 mg p.o. per day. 14.Flovent 2 puffs b.i.d. 15.Neurontin 100 mg p.o. t.i.d. 16.Nexium 40 mg p.o. per day. 17.K-Dur 40 mEq p.o. per day and 20 mEq p.o. at bedtime. 18.Alphagan 0.1 mg 1 GTT OU t.i.d. 19.Voltaren gel to bilateral knees q.i.d. 20.Omega-3 fatty acid 1 p.o. b.i.d. 21.Flomax 64 mg 2 p.o. per day. 22.Os-Cal plus D 1 p.o. b.i.d.  DIET:  Carb modified.  ACTIVITY LEVEL:  Independent with use of walker.  SPECIAL INSTRUCTIONS:  No alcohol, no smoking, no driving.  Use walker for ambulation.  Knee sleeve on left to prevent instability.  FOLLOWUP:  The patient to follow up with Dr. Riley Kill in 4 weeks.  Follow up with Dr. Parke Simmers for routine check as well as for pro time draws. Follow up with Dr. Phoebe Perch for postop  check.     Delle Reining, P.A.   ______________________________ Ranelle Oyster, M.D.    PL/MEDQ  D:  02/11/2011  T:  02/12/2011  Job:  [id number]  cc:   [name], M.D. Stefani Dama, M.D.  Electronically Signed by Osvaldo Shipper. on 02/20/2011 03:14:55 PM Electronically Signed by Faith Rogue M.D. on 02/22/2011 07:58:30 PM

## 2011-02-23 ENCOUNTER — Ambulatory Visit: Payer: BC Managed Care – PPO | Attending: Physical Medicine & Rehabilitation | Admitting: Physical Therapy

## 2011-02-23 DIAGNOSIS — M2569 Stiffness of other specified joint, not elsewhere classified: Secondary | ICD-10-CM | POA: Insufficient documentation

## 2011-02-23 DIAGNOSIS — M545 Low back pain, unspecified: Secondary | ICD-10-CM | POA: Insufficient documentation

## 2011-02-23 DIAGNOSIS — IMO0001 Reserved for inherently not codable concepts without codable children: Secondary | ICD-10-CM | POA: Insufficient documentation

## 2011-02-23 DIAGNOSIS — M6281 Muscle weakness (generalized): Secondary | ICD-10-CM | POA: Insufficient documentation

## 2011-02-23 DIAGNOSIS — R262 Difficulty in walking, not elsewhere classified: Secondary | ICD-10-CM | POA: Insufficient documentation

## 2011-02-23 DIAGNOSIS — R293 Abnormal posture: Secondary | ICD-10-CM | POA: Insufficient documentation

## 2011-02-25 ENCOUNTER — Ambulatory Visit: Payer: BC Managed Care – PPO | Admitting: Physical Therapy

## 2011-02-26 ENCOUNTER — Ambulatory Visit: Payer: BC Managed Care – PPO | Admitting: Rehabilitative and Restorative Service Providers"

## 2011-03-03 ENCOUNTER — Ambulatory Visit: Payer: BC Managed Care – PPO | Admitting: Rehabilitative and Restorative Service Providers"

## 2011-03-04 ENCOUNTER — Ambulatory Visit: Payer: BC Managed Care – PPO | Admitting: Rehabilitative and Restorative Service Providers"

## 2011-03-05 ENCOUNTER — Ambulatory Visit: Payer: BC Managed Care – PPO | Admitting: Rehabilitation

## 2011-03-09 ENCOUNTER — Ambulatory Visit: Payer: BC Managed Care – PPO | Admitting: Rehabilitation

## 2011-03-10 ENCOUNTER — Ambulatory Visit: Payer: BC Managed Care – PPO | Admitting: Rehabilitation

## 2011-03-11 ENCOUNTER — Encounter: Payer: BC Managed Care – PPO | Admitting: Rehabilitation

## 2011-03-17 ENCOUNTER — Ambulatory Visit: Payer: BC Managed Care – PPO | Admitting: Rehabilitation

## 2011-03-18 ENCOUNTER — Ambulatory Visit: Payer: BC Managed Care – PPO | Admitting: Rehabilitative and Restorative Service Providers"

## 2011-03-19 ENCOUNTER — Ambulatory Visit: Payer: BC Managed Care – PPO | Admitting: Physical Therapy

## 2011-03-20 ENCOUNTER — Encounter: Payer: BC Managed Care – PPO | Admitting: Physical Therapy

## 2011-03-23 ENCOUNTER — Encounter: Payer: BC Managed Care – PPO | Admitting: Rehabilitative and Restorative Service Providers"

## 2011-03-24 ENCOUNTER — Ambulatory Visit: Payer: BC Managed Care – PPO | Attending: Physical Medicine & Rehabilitation | Admitting: Rehabilitation

## 2011-03-24 DIAGNOSIS — M545 Low back pain, unspecified: Secondary | ICD-10-CM | POA: Insufficient documentation

## 2011-03-24 DIAGNOSIS — M6281 Muscle weakness (generalized): Secondary | ICD-10-CM | POA: Insufficient documentation

## 2011-03-24 DIAGNOSIS — R293 Abnormal posture: Secondary | ICD-10-CM | POA: Insufficient documentation

## 2011-03-24 DIAGNOSIS — R262 Difficulty in walking, not elsewhere classified: Secondary | ICD-10-CM | POA: Insufficient documentation

## 2011-03-24 DIAGNOSIS — IMO0001 Reserved for inherently not codable concepts without codable children: Secondary | ICD-10-CM | POA: Insufficient documentation

## 2011-03-24 DIAGNOSIS — M2569 Stiffness of other specified joint, not elsewhere classified: Secondary | ICD-10-CM | POA: Insufficient documentation

## 2011-03-25 ENCOUNTER — Ambulatory Visit: Payer: BC Managed Care – PPO | Admitting: Rehabilitation

## 2011-03-25 ENCOUNTER — Encounter
Payer: BC Managed Care – PPO | Attending: Physical Medicine & Rehabilitation | Admitting: Physical Medicine & Rehabilitation

## 2011-03-25 DIAGNOSIS — M25569 Pain in unspecified knee: Secondary | ICD-10-CM | POA: Insufficient documentation

## 2011-03-25 DIAGNOSIS — M4712 Other spondylosis with myelopathy, cervical region: Secondary | ICD-10-CM

## 2011-03-25 DIAGNOSIS — E119 Type 2 diabetes mellitus without complications: Secondary | ICD-10-CM | POA: Insufficient documentation

## 2011-03-25 DIAGNOSIS — E669 Obesity, unspecified: Secondary | ICD-10-CM

## 2011-03-25 DIAGNOSIS — M1710 Unilateral primary osteoarthritis, unspecified knee: Secondary | ICD-10-CM | POA: Insufficient documentation

## 2011-03-26 ENCOUNTER — Ambulatory Visit: Payer: BC Managed Care – PPO | Admitting: Rehabilitative and Restorative Service Providers"

## 2011-03-26 NOTE — Assessment & Plan Note (Signed)
[name] is back regarding her C3-C4 myelopathy with cord compression, status post decompression.  She has been working with outpatient therapies and has visits to the middle of this month.  She reports knee pain, right greater the left.  We injected her knees in the hospital and the right knee seemed to be acting up again.  Dr. Shelle Iron apparently injected the knee yesterday and she finds some benefit.  She has some pain shooting into the right leg.  She reports the left leg is still heavy.  She uses a walker outside the home and sometimes within the home and walks without the walker around the house.  Sleep has been an issue sometimes, has restless legs symptoms.  However, she is putting her stockings on now.  This seems to be helping.  REVIEW OF SYSTEMS:  Notable for tingling, easy bleeding, and low sugars. Full 12-point review is in the written health history section of the chart.  SOCIAL HISTORY:  The patient is single, living alone.  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  She states she has to walk about 200-300 feet into the office from her car and does have to walk within the building somewhat too for her job.  Otherwise, it is sedentary.  PHYSICAL EXAMINATION:  VITAL SIGNS:  Blood pressure 127/62, pulse 87, respiratory rate 18, and she is saturating 92% on room air. GENERAL:  The patient is pleasant, alert.  She remains overweight. MUSCULOSKELETAL:  Strength in the legs is actually quite good at 4-5/5. She had some pain inhibition at both knees with resisted extension.  She has some pain with meniscal maneuvers and varus and valgus stresses with some mild sensory diminishment over the left leg in a scattered fashion. Less sensory loss compared to the right leg.  Standing posture was fair to good.  She walked for me today and tended  to favor the left leg with gait.  She seemed to have some antalgia, however, on either side due to her knee pain.  Truncal posture was fair to good.  ASSESSMENT: 1. C3-C4 myelopathy with cord compression. 2. Osteoarthritis, bilateral knees. 3. Diabetes type 2.  PLAN: 1. I almost feel that her gait problems are more related to the knee     symptoms rather than her myelopathy.  She is showing improvement     neurologically and as her strength improves as well as her weight     shifting, this should help her knee pain.  For now, I will continue     with physical therapy until completion working on uneven surfaces     as well to improve balance. 2. I gave the patient a prescription for tramadol #60 one q.6 h p.r.n.     for breakthrough pain.  Encouraged weight loss as well. 3. The patient will follow up with Dr. Shelle Iron regarding her orthopedic     needs. 4. I gave the patient permission to return to work on April 22, 2011,     on a part-time basis initially for the first week and then to     increase as tolerated.  I think if she can limit her gait distances     that she should be able to     perform the duties of her job.  A prescription was written to this  effect today. 5. I will see the patient back here in about 2 months.  She will call     me with any problems or questions.     Ranelle Oyster, M.D. Electronically Signed    ZTS/MedQ D:  03/25/2011 14:40:42  T:  03/25/2011 21:34:07  Job #:  [id number]  cc:   [name], M.D. Fax: [phone]  Stefani Dama, M.D. Fax: [phone]

## 2011-03-30 ENCOUNTER — Ambulatory Visit: Payer: BC Managed Care – PPO | Admitting: Rehabilitative and Restorative Service Providers"

## 2011-03-31 ENCOUNTER — Inpatient Hospital Stay: Payer: BC Managed Care – PPO | Admitting: Physical Medicine & Rehabilitation

## 2011-04-01 ENCOUNTER — Ambulatory Visit: Payer: BC Managed Care – PPO | Admitting: Rehabilitative and Restorative Service Providers"

## 2011-04-02 ENCOUNTER — Ambulatory Visit: Payer: BC Managed Care – PPO | Admitting: Rehabilitative and Restorative Service Providers"

## 2011-04-02 ENCOUNTER — Encounter: Payer: BC Managed Care – PPO | Admitting: Physical Therapy

## 2011-04-03 ENCOUNTER — Encounter: Payer: BC Managed Care – PPO | Admitting: Physical Therapy

## 2011-04-06 ENCOUNTER — Ambulatory Visit: Payer: BC Managed Care – PPO | Admitting: Rehabilitative and Restorative Service Providers"

## 2011-04-07 ENCOUNTER — Ambulatory Visit: Payer: BC Managed Care – PPO | Admitting: Rehabilitation

## 2011-05-21 DIAGNOSIS — N938 Other specified abnormal uterine and vaginal bleeding: Secondary | ICD-10-CM | POA: Insufficient documentation

## 2011-05-21 DIAGNOSIS — G4733 Obstructive sleep apnea (adult) (pediatric): Secondary | ICD-10-CM | POA: Insufficient documentation

## 2011-05-21 DIAGNOSIS — M1990 Unspecified osteoarthritis, unspecified site: Secondary | ICD-10-CM | POA: Insufficient documentation

## 2011-05-21 DIAGNOSIS — E119 Type 2 diabetes mellitus without complications: Secondary | ICD-10-CM | POA: Insufficient documentation

## 2011-05-26 ENCOUNTER — Encounter
Payer: BC Managed Care – PPO | Attending: Physical Medicine & Rehabilitation | Admitting: Physical Medicine & Rehabilitation

## 2011-06-15 ENCOUNTER — Telehealth: Payer: Self-pay | Admitting: Physical Medicine & Rehabilitation

## 2011-06-15 NOTE — Telephone Encounter (Signed)
Patient states she has not had a good past week.  Having same problems and feeling like she did before she started falling.  Patient fell today coming out of her office building and required help in getting up.

## 2011-06-16 NOTE — Telephone Encounter (Signed)
FYI

## 2011-06-16 NOTE — Telephone Encounter (Signed)
Pt aware. She has an appointment on 06/30/11 and that is the earliest appointment for Dr. Riley Kill.

## 2011-06-16 NOTE — Telephone Encounter (Signed)
If she feels like she's declining, perhaps she needs to be seen here sooner.  When does she follow up?  Can we get her an appointment inside of a month?

## 2011-06-29 ENCOUNTER — Other Ambulatory Visit: Payer: Self-pay | Admitting: *Deleted

## 2011-06-29 MED ORDER — GABAPENTIN 100 MG PO CAPS: 100.0000 mg | ORAL_CAPSULE | Freq: Three times a day (TID) | ORAL | Status: DC

## 2011-06-30 ENCOUNTER — Encounter
Payer: BC Managed Care – PPO | Attending: Physical Medicine & Rehabilitation | Admitting: Physical Medicine & Rehabilitation

## 2011-06-30 ENCOUNTER — Encounter: Payer: Self-pay | Admitting: Physical Medicine & Rehabilitation

## 2011-06-30 DIAGNOSIS — M4712 Other spondylosis with myelopathy, cervical region: Secondary | ICD-10-CM

## 2011-06-30 DIAGNOSIS — M1710 Unilateral primary osteoarthritis, unspecified knee: Secondary | ICD-10-CM

## 2011-06-30 DIAGNOSIS — M170 Bilateral primary osteoarthritis of knee: Secondary | ICD-10-CM | POA: Insufficient documentation

## 2011-06-30 DIAGNOSIS — IMO0002 Reserved for concepts with insufficient information to code with codable children: Secondary | ICD-10-CM

## 2011-06-30 MED ORDER — METHOCARBAMOL 500 MG PO TABS: 500.0000 mg | ORAL_TABLET | Freq: Four times a day (QID) | ORAL | Status: DC | PRN

## 2011-06-30 NOTE — Patient Instructions (Signed)
Continue activity, exercise to tolerance.

## 2011-06-30 NOTE — Progress Notes (Signed)
  Subjective:    Patient ID: [name], female    DOB: February 25, 1961, 51 y.o.   MRN: [id number]  HPI  [name] is back regarding her myelopathy and OA of knees. Strength is steadily improving. Tingling has decreased to 5th fingers on either side.  Usually taking neurontin on schedule which really helps. Using cane for security.  Had a fall about 2 weeks ago while she was walking out of work,when her legs suddenly gave out. Happened on side walk.    Dr. Shelle Iron is potentially going to do a TKA on the left this spring or summer.   She is back to full time work without any issues  Pain Inventory Average Pain 2 Pain Right Now 1 My pain is intermittent, tingling and aching  In the last 24 hours, has pain interfered with the following? General activity 0 Relation with others 0 Enjoyment of life 0 What TIME of day is your pain at its worst? evening Sleep (in general) Good  Pain is worse with: some activites Pain improves with: medication Relief from Meds: 7  Mobility walk without assistance use a cane ability to climb steps?  yes do you drive?  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  no  Physicians involved in your care Any changes since last visit?  no Primary care [name] Orthopedist Jene Every      Review of Systems  Musculoskeletal: Positive for gait problem.  Neurological: Positive for dizziness and numbness.       Tingling in tips of fingers  All other systems reviewed and are negative.       Objective:   Physical Exam  Constitutional: She is oriented to person, place, and time. She appears well-developed.       obese  HENT:  Head: Normocephalic and atraumatic.  Eyes: EOM are normal. Pupils are equal, round, and reactive to light.  Neck: Normal range of motion.  Cardiovascular: Normal rate and regular rhythm.     Pulmonary/Chest: Effort normal and breath sounds normal.  Abdominal: Soft.  Musculoskeletal:       Right shoulder: She exhibits bony tenderness and swelling.       Mild crepitus at knees  Neurological: She is alert and oriented to person, place, and time. She has normal strength. A sensory deficit is present. No cranial nerve deficit.       Near normal strength except for HI which were 4/5. Slight decrease in PP along 5th fingers.  DTR's slightly hyperactive.   Psychiatric: She has a normal mood and affect. Her speech is normal and behavior is normal. Judgment and thought content normal. Cognition and memory are normal.          Assessment & Plan:  ASSESSMENT:  1. C3-C4 myelopathy with cord compression.  2. Osteoarthritis, bilateral knees.  3. Diabetes type 2.  PLAN:  1. Ortho follow up for knees per Dr. Shelle Iron. Replacement surgery potentially this spring or summer. 2. Use neurontin for neuropathic pain.  I would expect her sensory symptoms to continue to improve over the next 6 months..  for breakthrough pain. Encouraged weight loss as well.   3. Continue work to tolerance. 4. She may come back to see me PRN. I did refill robaxin prn. I can refill the neurontin as needed over the next month or two.

## 2011-07-19 ENCOUNTER — Other Ambulatory Visit: Payer: Self-pay | Admitting: Physical Medicine & Rehabilitation

## 2011-07-22 ENCOUNTER — Other Ambulatory Visit: Payer: Self-pay | Admitting: Otolaryngology

## 2011-07-23 ENCOUNTER — Encounter: Payer: Self-pay | Admitting: Physical Medicine & Rehabilitation

## 2011-08-03 ENCOUNTER — Other Ambulatory Visit: Payer: Self-pay | Admitting: *Deleted

## 2011-08-03 MED ORDER — GABAPENTIN 100 MG PO CAPS: 100.0000 mg | ORAL_CAPSULE | Freq: Three times a day (TID) | ORAL | Status: DC

## 2011-08-27 ENCOUNTER — Encounter (HOSPITAL_COMMUNITY): Payer: Self-pay | Admitting: Pharmacy Technician

## 2011-08-31 NOTE — H&P (Signed)
Chief Complaint:  left knee pain.  Subjective: Patient is admitted for left total knee arthroplasty.  Patient is a 51 y.o. female who has been seen by Dr. Shelle Iron for ongoing hip pain.  They have been followed and found to have continued progressive pain.  They have been treated conservatively in the past including medications.  Despite conservative measures, they continue to have pain.  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  It is felt that they would benefit from undergoing surgical intervention.  Risks and benefits have been discussed with the patient and they elect to proceed with surgery.  The patient has no contraindications to the upcoming procedure such as ongoing infection or progressive neurological disease.  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  She was cleared by her PCP Dr Parke Simmers.  Allergies: Allergies  Allergen Reactions  . Beta Adrenergic Blockers   . Penicillins   . Sulfa Antibiotics   . Thimerosal      Medications: Voltaren (1% Gel) Alphagan P (0.1% Solution, Ophthalmic) Active. ALPRAZolam (0.5MG  Tablet, Oral) Active. Azopt (1% Suspension, Ophthalmic) Active. CeleBREX (200MG  Capsule, Oral) Active. Cyclobenzaprine HCl (10MG  Tablet, Oral) Active. Diltiazem HCl Coated Beads (240MG  Capsule ER 24HR, Oral) Active. Diovan HCT (80-12.5MG  Tablet, Oral) Active. Gabapentin (100MG  Capsule, Oral) Active. Lumigan (0.01% Solution, Ophthalmic) Active. Methocarbamol (500MG  Tablet, Oral) Active. NexIUM (40MG  Capsule DR, Oral) Active. Pataday (0.2% Solution, Ophthalmic) Active. Potassium Chloride Crys CR ( Tablet ER, Oral) Active. Qualaquin (324MG  Capsule, Oral) Active. TraMADol HCl (50MG  Tablet, Oral) Active. Vitamin D (Ergocalciferol) (50000UNIT Capsule, Oral) Active. Warfarin Sodium (10MG  Tablet, Oral) Active.  Past Medical History: Past Medical History  Diagnosis Date  .  Hypertension   . Acute renal failure   . Diabetes mellitus   . Arthritis   . Asthma   . Sleep apnea, obstructive   . Morbid obesity   . PE (pulmonary embolism)   . DVT (deep venous thrombosis)   . GERD (gastroesophageal reflux disease)   . Dysfunctional uterine bleeding      Past Surgical History: Past Surgical History  Procedure Date  . Posterior laminectomy / decompression cervical spine 01/26/2011  . Glaucoma surgery 10/31/2009  . Eye surgery 11/16/2009  . Gastric bypass [id number]  . Knee arthroscopy 12/2007     Family History: Family History  Problem Relation Age of Onset  . Glaucoma    . Cerebral aneurysm    . Stroke    . Arthritis Mother   . Hypertension Mother   . Diabetes Mother   . Arthritis Father   . Diabetes Father   . Alcohol abuse Father     Social History: History  Substance Use Topics  . Smoking status: Never Smoker   . Smokeless tobacco: Never Used  . Alcohol Use: previous     Review of Systems General:Not Present- Chills, Fever, Night Sweats, Appetite Loss, Fatigue, Feeling sick, Weight Gain and Weight Loss. Skin:Not Present- Itching, Rash, Skin Color Changes, Ulcer, Psoriasis and Change in Hair or Nails. HEENT:Not Present- Sensitivity to light, Hearing problems, Nose Bleed and Ringing in the Ears. Neck:Not Present- Swollen Glands and Neck Mass. Respiratory:Not Present- Snoring, Chronic Cough, Bloody sputum and Dyspnea. Cardiovascular:Not Present- Shortness of Breath, Chest Pain, Swelling of Extremities, Leg Cramps and Palpitations. Gastrointestinal:Not Present- Bloody Stool, Heartburn, Abdominal Pain, Vomiting, Nausea and Incontinence of Stool. Female Genitourinary:Not Present- Blood in Urine, Menstrual Irregularities, Frequency, Incontinence and  Nocturia. Musculoskeletal:Not Present- Muscle Weakness, Muscle Pain, Joint Stiffness, Joint Swelling, Joint Pain and Back Pain. Neurological:Present- Tingling. Not Present- Numbness, Burning,  Tremor, Headaches and Dizziness. Psychiatric:Not Present- Anxiety, Depression and Memory Loss. Endocrine:Not Present- Cold Intolerance, Heat Intolerance, Excessive hunger and Excessive Thirst. Hematology:Not Present- Abnormal Bleeding, Anemia, Blood Clots and Easy Bruising.    Physical Exam:  Vitals: Pulse:60 Respirations:10 Blood Pressure:132/68   There were no vitals taken for this visit.  General Appearance:    Antalgic gait with varus thrust, Alert, cooperative, no distress, appears stated age  Head:    Normocephalic, without obvious abnormality, atraumatic  Eyes:    PERRL, conjunctiva/corneas clear, EOM's intact, fundi    benign, both eyes  Ears:    Normal TM's and external ear canals, both ears  Nose:   Nares normal, septum midline, mucosa normal, no drainage    or sinus tenderness           Lungs:     Clear to auscultation bilaterally, respirations unlabored  Chest Wall:    No tenderness or deformity   Heart:    Regular rate and rhythm, S1 and S2 normal, no murmur, rub   or gallop     Abdomen:     Soft, non-tender, bowel sounds active all four quadrants,    no masses, no organomegaly        Extremities:  mild effusion, -5 to 90, pain with patellofemoral compression, slight instability at 30 degrees  Pulses:   2+ and symmetric all extremities  Skin:   Skin color, texture, turgor normal, no rashes or lesions          Assessment/Plan: End stage arthritis, left knee  The patient is being admitted to [facility] to undergo a left total knee arthroplasty.  Surgery will be performed by Dr. Jene Every.  Risks and benefits have been discussed with the patient and they elect to proceed wth the procedure.  Patient was advised to hold Coumadin 7 days prior to surgery.  She does have an appointment with Dr Parke Simmers 5/14 and will discuss the need to bridge with Lovenox.  Patient would like to go to inpatient rehab post op.

## 2011-09-03 ENCOUNTER — Ambulatory Visit (HOSPITAL_COMMUNITY)
Admission: RE | Admit: 2011-09-03 | Discharge: 2011-09-03 | Disposition: A | Discharge status: Home / Self Care | Payer: BC Managed Care – PPO | Source: Ambulatory Visit | Attending: Otolaryngology | Admitting: Otolaryngology

## 2011-09-03 ENCOUNTER — Encounter (HOSPITAL_COMMUNITY)
Admission: RE | Admit: 2011-09-03 | Discharge: 2011-09-03 | Disposition: A | Discharge status: Home / Self Care | Payer: BC Managed Care – PPO | Source: Ambulatory Visit | Attending: Specialist | Admitting: Specialist

## 2011-09-03 ENCOUNTER — Encounter (HOSPITAL_COMMUNITY): Payer: Self-pay

## 2011-09-03 DIAGNOSIS — M1710 Unilateral primary osteoarthritis, unspecified knee: Secondary | ICD-10-CM | POA: Insufficient documentation

## 2011-09-03 DIAGNOSIS — Z01812 Encounter for preprocedural laboratory examination: Secondary | ICD-10-CM | POA: Insufficient documentation

## 2011-09-03 DIAGNOSIS — Z01818 Encounter for other preprocedural examination: Secondary | ICD-10-CM | POA: Insufficient documentation

## 2011-09-03 HISTORY — DX: Headache: R51

## 2011-09-03 LAB — COMPREHENSIVE METABOLIC PANEL
ALT: 27 U/L (ref 0–35)
AST: 23 U/L (ref 0–37)
Albumin: 3.7 g/dL (ref 3.5–5.2)
Alkaline Phosphatase: 77 U/L (ref 39–117)
BUN: 13 mg/dL (ref 6–23)
CO2: 26 mEq/L (ref 19–32)
Calcium: 9.5 mg/dL (ref 8.4–10.5)
Chloride: 107 mEq/L (ref 96–112)
Creatinine, Ser: 1.04 mg/dL (ref 0.50–1.10)
GFR calc Af Amer: 70 mL/min — ABNORMAL LOW (ref >90)
GFR calc non Af Amer: 61 mL/min — ABNORMAL LOW (ref >90)
Glucose, Bld: 63 mg/dL — ABNORMAL LOW (ref 70–99)
Potassium: 3.1 mEq/L — ABNORMAL LOW (ref 3.5–5.1)
Sodium: 145 mEq/L (ref 135–145)
Total Bilirubin: 0.4 mg/dL (ref 0.3–1.2)
Total Protein: 7 g/dL (ref 6.0–8.3)

## 2011-09-03 LAB — DIFFERENTIAL
Basophils Absolute: 0.1 10*3/uL (ref 0.0–0.1)
Basophils Relative: 1 % (ref 0–1)
Eosinophils Absolute: 0.3 10*3/uL (ref 0.0–0.7)
Eosinophils Relative: 5 % (ref 0–5)
Lymphocytes Relative: 39 % (ref 12–46)
Lymphs Abs: 2.3 10*3/uL (ref 0.7–4.0)
Monocytes Absolute: 0.6 10*3/uL (ref 0.1–1.0)
Monocytes Relative: 10 % (ref 3–12)
Neutro Abs: 2.6 10*3/uL (ref 1.7–7.7)
Neutrophils Relative %: 44 % (ref 43–77)

## 2011-09-03 LAB — URINALYSIS, ROUTINE W REFLEX MICROSCOPIC
Bilirubin Urine: NEGATIVE
Glucose, UA: 100 mg/dL — AB
Hgb urine dipstick: NEGATIVE
Ketones, ur: NEGATIVE mg/dL
Leukocytes, UA: NEGATIVE
Nitrite: NEGATIVE
Protein, ur: NEGATIVE mg/dL
Specific Gravity, Urine: 1.018 (ref 1.005–1.030)
Urobilinogen, UA: 0.2 mg/dL (ref 0.0–1.0)
pH: 6 (ref 5.0–8.0)

## 2011-09-03 LAB — CBC
HCT: 40.1 % (ref 36.0–46.0)
Hemoglobin: 13.6 g/dL (ref 12.0–15.0)
MCH: 27 pg (ref 26.0–34.0)
MCHC: 33.9 g/dL (ref 30.0–36.0)
MCV: 79.7 fL (ref 78.0–100.0)
Platelets: 173 10*3/uL (ref 150–400)
RBC: 5.03 MIL/uL (ref 3.87–5.11)
RDW: 15 % (ref 11.5–15.5)
WBC: 5.7 10*3/uL (ref 4.0–10.5)

## 2011-09-03 LAB — APTT: aPTT: 38 seconds — ABNORMAL HIGH (ref 24–37)

## 2011-09-03 LAB — PROTIME-INR
INR: 1.52 — ABNORMAL HIGH (ref 0.00–1.49)
Prothrombin Time: 18.6 seconds — ABNORMAL HIGH (ref 11.6–15.2)

## 2011-09-03 LAB — SURGICAL PCR SCREEN
MRSA, PCR: NEGATIVE
Staphylococcus aureus: NEGATIVE

## 2011-09-03 MED ORDER — CHLORHEXIDINE GLUCONATE 4 % EX LIQD
60.0000 mL | Freq: Once | CUTANEOUS | Status: DC
  Filled 2011-09-03: qty 60

## 2011-09-03 NOTE — Pre-Procedure Instructions (Signed)
C MET FAXED TO DR. Shelle Iron - CONFIRMATION RECEIVED FROM FAX

## 2011-09-03 NOTE — Patient Instructions (Signed)
18 [name]  09/03/2011   Your procedure is scheduled on:  09/09/11  Report to SHORT STAY DEPT  at 5:30 AM.  Call this number if you have problems the morning of surgery: [phone]   Remember:   Do not eat food or drink liquids AFTER MIDNIGHT    Take these medicines the morning of surgery with A SIP OF WATER: NEXIUM / ROBAXIN IF NEEDED / ALVESCO INHALER / USE EYE DROPS   Do not wear jewelry, make-up or nail polish.  Do not wear lotions, powders, or perfumes.   Do not shave legs or underarms 48 hrs. before surgery (men may shave face)  Do not bring valuables to the hospital.  Contacts, dentures or bridgework may not be worn into surgery.  Leave suitcase in the car. After surgery it may be brought to your room.  For patients admitted to the hospital, checkout time is 11:00 AM the day of discharge.   Patients discharged the day of surgery will not be allowed to drive home.    Special Instructions:   Please read over the following fact sheets that you were given: MRSA  Information / Incentive Spirometer               SHOWER WITH BETASEPT THE NIGHT BEFORE SURGERY AND THE MORNING OF SURGERY              Bring c-pap mask to hospital

## 2011-09-04 ENCOUNTER — Telehealth: Payer: Self-pay | Admitting: *Deleted

## 2011-09-04 NOTE — Telephone Encounter (Signed)
i would be happy to see her here in the office.  If she's alluding to inpatient rehab after knee surgery, that is an option too. But we would have to consult on her post-op and decide if she needed rehab

## 2011-09-04 NOTE — Telephone Encounter (Signed)
Pt aware.

## 2011-09-04 NOTE — Telephone Encounter (Signed)
Calling to ask if Dr. Riley Kill will treat her after her knee surgery.

## 2011-09-04 NOTE — Pre-Procedure Instructions (Signed)
RECEIVED FAX FROM DR. BEANE - NO ACTION NEEDED ON FAXED LAB

## 2011-09-08 MED ORDER — VANCOMYCIN HCL 1000 MG IV SOLR
1500.0000 mg | INTRAVENOUS | Status: AC
  Administered 2011-09-09: 1500 mg via INTRAVENOUS
  Filled 2011-09-08: qty 1500

## 2011-09-09 ENCOUNTER — Ambulatory Visit (HOSPITAL_COMMUNITY): Payer: BC Managed Care – PPO

## 2011-09-09 ENCOUNTER — Inpatient Hospital Stay (HOSPITAL_COMMUNITY)
Admission: RE | Admit: 2011-09-09 | Discharge: 2011-09-15 | DRG: 558 | Disposition: A | Discharge status: Rehabilitation Facility | Payer: BC Managed Care – PPO | Source: Ambulatory Visit | Attending: Specialist | Admitting: Specialist

## 2011-09-09 ENCOUNTER — Encounter (HOSPITAL_COMMUNITY)
Admission: RE | Disposition: A | Discharge status: Rehabilitation Facility | Payer: Self-pay | Source: Ambulatory Visit | Attending: Specialist

## 2011-09-09 ENCOUNTER — Ambulatory Visit (HOSPITAL_COMMUNITY): Payer: BC Managed Care – PPO | Admitting: Anesthesiology

## 2011-09-09 ENCOUNTER — Encounter (HOSPITAL_COMMUNITY): Payer: Self-pay | Admitting: Anesthesiology

## 2011-09-09 ENCOUNTER — Encounter (HOSPITAL_COMMUNITY): Payer: Self-pay | Admitting: *Deleted

## 2011-09-09 DIAGNOSIS — D649 Anemia, unspecified: Secondary | ICD-10-CM | POA: Diagnosis present

## 2011-09-09 DIAGNOSIS — R209 Unspecified disturbances of skin sensation: Secondary | ICD-10-CM | POA: Diagnosis not present

## 2011-09-09 DIAGNOSIS — G4733 Obstructive sleep apnea (adult) (pediatric): Secondary | ICD-10-CM | POA: Diagnosis present

## 2011-09-09 DIAGNOSIS — D62 Acute posthemorrhagic anemia: Secondary | ICD-10-CM | POA: Diagnosis not present

## 2011-09-09 DIAGNOSIS — R202 Paresthesia of skin: Secondary | ICD-10-CM | POA: Diagnosis not present

## 2011-09-09 DIAGNOSIS — Z96652 Presence of left artificial knee joint: Secondary | ICD-10-CM | POA: Diagnosis present

## 2011-09-09 DIAGNOSIS — I82509 Chronic embolism and thrombosis of unspecified deep veins of unspecified lower extremity: Secondary | ICD-10-CM | POA: Diagnosis present

## 2011-09-09 DIAGNOSIS — I10 Essential (primary) hypertension: Secondary | ICD-10-CM | POA: Diagnosis present

## 2011-09-09 DIAGNOSIS — I2782 Chronic pulmonary embolism: Secondary | ICD-10-CM | POA: Diagnosis present

## 2011-09-09 DIAGNOSIS — M170 Bilateral primary osteoarthritis of knee: Secondary | ICD-10-CM

## 2011-09-09 DIAGNOSIS — M1710 Unilateral primary osteoarthritis, unspecified knee: Principal | ICD-10-CM | POA: Diagnosis present

## 2011-09-09 DIAGNOSIS — Z6841 Body Mass Index (BMI) 40.0 and over, adult: Secondary | ICD-10-CM

## 2011-09-09 DIAGNOSIS — K219 Gastro-esophageal reflux disease without esophagitis: Secondary | ICD-10-CM | POA: Diagnosis present

## 2011-09-09 DIAGNOSIS — Z7901 Long term (current) use of anticoagulants: Secondary | ICD-10-CM

## 2011-09-09 DIAGNOSIS — E119 Type 2 diabetes mellitus without complications: Secondary | ICD-10-CM | POA: Diagnosis present

## 2011-09-09 DIAGNOSIS — N179 Acute kidney failure, unspecified: Secondary | ICD-10-CM | POA: Diagnosis present

## 2011-09-09 HISTORY — PX: TOTAL KNEE ARTHROPLASTY: SHX125

## 2011-09-09 LAB — PROTIME-INR
INR: 1 (ref 0.00–1.49)
Prothrombin Time: 13.4 seconds (ref 11.6–15.2)

## 2011-09-09 LAB — HEMOGLOBIN AND HEMATOCRIT, BLOOD
HCT: 32.7 % — ABNORMAL LOW (ref 36.0–46.0)
Hemoglobin: 11 g/dL — ABNORMAL LOW (ref 12.0–15.0)

## 2011-09-09 LAB — HCG, SERUM, QUALITATIVE: Preg, Serum: NEGATIVE

## 2011-09-09 LAB — ABO/RH: ABO/RH(D): O POS

## 2011-09-09 SURGERY — ARTHROPLASTY, KNEE, TOTAL
Anesthesia: General | Site: Knee | Laterality: Left | Wound class: Clean

## 2011-09-09 MED ORDER — LORATADINE 10 MG PO TABS
10.0000 mg | ORAL_TABLET | Freq: Every day | ORAL | Status: DC
  Administered 2011-09-10 – 2011-09-15 (×6): 10 mg via ORAL
  Filled 2011-09-09 (×7): qty 1

## 2011-09-09 MED ORDER — DIPHENHYDRAMINE HCL 12.5 MG/5ML PO ELIX
12.5000 mg | ORAL_SOLUTION | ORAL | Status: DC | PRN
  Administered 2011-09-09 – 2011-09-10 (×2): 25 mg via ORAL
  Filled 2011-09-09 (×2): qty 10

## 2011-09-09 MED ORDER — HYDROMORPHONE HCL PF 1 MG/ML IJ SOLN
INTRAMUSCULAR | Status: AC
  Filled 2011-09-09: qty 1

## 2011-09-09 MED ORDER — ACETAMINOPHEN 650 MG RE SUPP: 650.0000 mg | Freq: Four times a day (QID) | RECTAL | Status: DC | PRN

## 2011-09-09 MED ORDER — LACTATED RINGERS IV SOLN: INTRAVENOUS | Status: DC

## 2011-09-09 MED ORDER — CIPROFLOXACIN IN D5W 400 MG/200ML IV SOLN
INTRAVENOUS | Status: DC | PRN
  Administered 2011-09-09: 400 mg via INTRAVENOUS

## 2011-09-09 MED ORDER — HYDROMORPHONE HCL PF 1 MG/ML IJ SOLN
0.2500 mg | INTRAMUSCULAR | Status: DC | PRN
  Administered 2011-09-09 (×4): 0.5 mg via INTRAVENOUS

## 2011-09-09 MED ORDER — MAGNESIUM CHLORIDE 64 MG PO TBEC
2.0000 | DELAYED_RELEASE_TABLET | Freq: Every day | ORAL | Status: DC
  Administered 2011-09-09 – 2011-09-14 (×6): 128 mg via ORAL
  Filled 2011-09-09 (×9): qty 2

## 2011-09-09 MED ORDER — EPHEDRINE SULFATE 50 MG/ML IJ SOLN
INTRAMUSCULAR | Status: DC | PRN
  Administered 2011-09-09: 10 mg via INTRAVENOUS

## 2011-09-09 MED ORDER — PROMETHAZINE HCL 25 MG/ML IJ SOLN: 6.2500 mg | INTRAMUSCULAR | Status: DC | PRN

## 2011-09-09 MED ORDER — ONDANSETRON HCL 4 MG PO TABS: 4.0000 mg | ORAL_TABLET | Freq: Four times a day (QID) | ORAL | Status: DC | PRN

## 2011-09-09 MED ORDER — NIACIN 500 MG PO TABS
500.0000 mg | ORAL_TABLET | Freq: Every day | ORAL | Status: DC
  Administered 2011-09-10 – 2011-09-12 (×3): 500 mg via ORAL
  Filled 2011-09-09 (×8): qty 1

## 2011-09-09 MED ORDER — CIPROFLOXACIN IN D5W 400 MG/200ML IV SOLN
INTRAVENOUS | Status: AC
  Filled 2011-09-09: qty 200

## 2011-09-09 MED ORDER — SODIUM CHLORIDE 0.9 % IR SOLN
Status: DC | PRN
  Administered 2011-09-09: 3000 mL

## 2011-09-09 MED ORDER — PHENOL 1.4 % MT LIQD
1.0000 | OROMUCOSAL | Status: DC | PRN
  Filled 2011-09-09: qty 177

## 2011-09-09 MED ORDER — WARFARIN SODIUM 10 MG PO TABS
10.0000 mg | ORAL_TABLET | Freq: Once | ORAL | Status: AC
  Administered 2011-09-09: 10 mg via ORAL
  Filled 2011-09-09: qty 1

## 2011-09-09 MED ORDER — HYDROMORPHONE HCL PF 1 MG/ML IJ SOLN
0.5000 mg | INTRAMUSCULAR | Status: DC | PRN
  Administered 2011-09-09 – 2011-09-10 (×5): 1 mg via INTRAVENOUS
  Filled 2011-09-09 (×4): qty 1

## 2011-09-09 MED ORDER — CEFAZOLIN SODIUM-DEXTROSE 2-3 GM-% IV SOLR
INTRAVENOUS | Status: AC
  Filled 2011-09-09: qty 50

## 2011-09-09 MED ORDER — PROPOFOL 10 MG/ML IV EMUL
INTRAVENOUS | Status: DC | PRN
  Administered 2011-09-09: 150 mg via INTRAVENOUS

## 2011-09-09 MED ORDER — BIMATOPROST 0.01 % OP SOLN
1.0000 [drp] | Freq: Every day | OPHTHALMIC | Status: DC
  Administered 2011-09-09 – 2011-09-14 (×6): 1 [drp] via OPHTHALMIC
  Filled 2011-09-09: qty 2.5

## 2011-09-09 MED ORDER — IRBESARTAN 75 MG PO TABS
75.0000 mg | ORAL_TABLET | Freq: Every day | ORAL | Status: DC
  Administered 2011-09-09 – 2011-09-10 (×2): 75 mg via ORAL
  Filled 2011-09-09 (×2): qty 1

## 2011-09-09 MED ORDER — METHOCARBAMOL 100 MG/ML IJ SOLN
500.0000 mg | Freq: Four times a day (QID) | INTRAVENOUS | Status: DC | PRN
  Administered 2011-09-09 – 2011-09-10 (×2): 500 mg via INTRAVENOUS
  Filled 2011-09-09 (×2): qty 5

## 2011-09-09 MED ORDER — ROCURONIUM BROMIDE 100 MG/10ML IV SOLN
INTRAVENOUS | Status: DC | PRN
  Administered 2011-09-09: 50 mg via INTRAVENOUS

## 2011-09-09 MED ORDER — BRINZOLAMIDE 1 % OP SUSP
1.0000 [drp] | Freq: Three times a day (TID) | OPHTHALMIC | Status: DC
  Administered 2011-09-09 – 2011-09-15 (×17): 1 [drp] via OPHTHALMIC
  Filled 2011-09-09: qty 10

## 2011-09-09 MED ORDER — PANTOPRAZOLE SODIUM 40 MG PO TBEC
80.0000 mg | DELAYED_RELEASE_TABLET | Freq: Every day | ORAL | Status: DC
  Administered 2011-09-10 – 2011-09-15 (×6): 80 mg via ORAL
  Filled 2011-09-09 (×9): qty 2

## 2011-09-09 MED ORDER — KETOROLAC TROMETHAMINE 30 MG/ML IJ SOLN
INTRAMUSCULAR | Status: AC
  Filled 2011-09-09: qty 1

## 2011-09-09 MED ORDER — DOCUSATE SODIUM 100 MG PO CAPS
100.0000 mg | ORAL_CAPSULE | Freq: Two times a day (BID) | ORAL | Status: DC
  Administered 2011-09-09 – 2011-09-15 (×12): 100 mg via ORAL

## 2011-09-09 MED ORDER — DILTIAZEM HCL ER 240 MG PO CP24
240.0000 mg | ORAL_CAPSULE | Freq: Every day | ORAL | Status: DC
  Administered 2011-09-09 – 2011-09-14 (×6): 240 mg via ORAL
  Filled 2011-09-09 (×7): qty 1

## 2011-09-09 MED ORDER — POLYVINYL ALCOHOL 1.4 % OP SOLN
1.0000 [drp] | Freq: Three times a day (TID) | OPHTHALMIC | Status: DC
  Administered 2011-09-09 – 2011-09-15 (×11): 1 [drp] via OPHTHALMIC
  Filled 2011-09-09: qty 15

## 2011-09-09 MED ORDER — WARFARIN - PHARMACIST DOSING INPATIENT
Freq: Every day | Status: DC
  Administered 2011-09-09: 18:00:00

## 2011-09-09 MED ORDER — METHOCARBAMOL 500 MG PO TABS
500.0000 mg | ORAL_TABLET | Freq: Four times a day (QID) | ORAL | Status: DC | PRN
Start: 2011-09-09 — End: 2011-09-15
  Administered 2011-09-10 – 2011-09-15 (×14): 500 mg via ORAL
  Filled 2011-09-09 (×14): qty 1

## 2011-09-09 MED ORDER — CIPROFLOXACIN IN D5W 400 MG/200ML IV SOLN
400.0000 mg | Freq: Two times a day (BID) | INTRAVENOUS | Status: AC
  Administered 2011-09-09 – 2011-09-10 (×2): 400 mg via INTRAVENOUS
  Filled 2011-09-09 (×2): qty 200

## 2011-09-09 MED ORDER — LIDOCAINE HCL (CARDIAC) 20 MG/ML IV SOLN
INTRAVENOUS | Status: DC | PRN
  Administered 2011-09-09: 100 mg via INTRAVENOUS

## 2011-09-09 MED ORDER — ACETAMINOPHEN 325 MG PO TABS
650.0000 mg | ORAL_TABLET | Freq: Four times a day (QID) | ORAL | Status: DC | PRN
  Administered 2011-09-11 – 2011-09-15 (×7): 650 mg via ORAL
  Filled 2011-09-09 (×7): qty 2

## 2011-09-09 MED ORDER — NEOSTIGMINE METHYLSULFATE 1 MG/ML IJ SOLN
INTRAMUSCULAR | Status: DC | PRN
  Administered 2011-09-09: 2 mg via INTRAVENOUS

## 2011-09-09 MED ORDER — ONDANSETRON HCL 4 MG/2ML IJ SOLN: 4.0000 mg | Freq: Four times a day (QID) | INTRAMUSCULAR | Status: DC | PRN

## 2011-09-09 MED ORDER — VITAMIN B-12 1000 MCG PO TABS
2500.0000 ug | ORAL_TABLET | Freq: Every day | ORAL | Status: DC
  Administered 2011-09-10 – 2011-09-11 (×2): 2500 ug via ORAL
  Administered 2011-09-12 – 2011-09-13 (×2): via ORAL
  Administered 2011-09-14 – 2011-09-15 (×2): 2500 ug via ORAL
  Filled 2011-09-09 (×7): qty 1

## 2011-09-09 MED ORDER — BRIMONIDINE TARTRATE 0.2 % OP SOLN
1.0000 [drp] | Freq: Three times a day (TID) | OPHTHALMIC | Status: DC
  Administered 2011-09-09 – 2011-09-15 (×17): 1 [drp] via OPHTHALMIC
  Filled 2011-09-09: qty 5

## 2011-09-09 MED ORDER — ACETAMINOPHEN 10 MG/ML IV SOLN
INTRAVENOUS | Status: AC
  Filled 2011-09-09: qty 100

## 2011-09-09 MED ORDER — BUPIVACAINE-EPINEPHRINE 0.5% -1:200000 IJ SOLN
INTRAMUSCULAR | Status: AC
  Filled 2011-09-09: qty 1

## 2011-09-09 MED ORDER — GLYCOPYRROLATE 0.2 MG/ML IJ SOLN
INTRAMUSCULAR | Status: DC | PRN
  Administered 2011-09-09: .3 mg via INTRAVENOUS

## 2011-09-09 MED ORDER — HYDROMORPHONE 0.3 MG/ML IV SOLN
INTRAVENOUS | Status: AC
  Filled 2011-09-09: qty 25

## 2011-09-09 MED ORDER — SUFENTANIL CITRATE 50 MCG/ML IV SOLN
INTRAVENOUS | Status: DC | PRN
  Administered 2011-09-09: 5 ug via INTRAVENOUS
  Administered 2011-09-09: 10 ug via INTRAVENOUS
  Administered 2011-09-09: 5 ug via INTRAVENOUS
  Administered 2011-09-09: 10 ug via INTRAVENOUS
  Administered 2011-09-09: 5 ug via INTRAVENOUS
  Administered 2011-09-09: 10 ug via INTRAVENOUS
  Administered 2011-09-09: 5 ug via INTRAVENOUS

## 2011-09-09 MED ORDER — POLYETHYLENE GLYCOL 3350 17 G PO PACK
17.0000 g | PACK | Freq: Every day | ORAL | Status: DC | PRN
  Administered 2011-09-12: 17 g via ORAL

## 2011-09-09 MED ORDER — PROPYLENE GLYCOL 0.6 % OP SOLN: 1.0000 [drp] | Freq: Three times a day (TID) | OPHTHALMIC | Status: DC

## 2011-09-09 MED ORDER — GABAPENTIN 100 MG PO CAPS
100.0000 mg | ORAL_CAPSULE | Freq: Three times a day (TID) | ORAL | Status: DC
  Administered 2011-09-09 – 2011-09-13 (×11): 100 mg via ORAL
  Filled 2011-09-09 (×14): qty 1

## 2011-09-09 MED ORDER — KETOROLAC TROMETHAMINE 30 MG/ML IJ SOLN
15.0000 mg | Freq: Once | INTRAMUSCULAR | Status: AC | PRN
  Administered 2011-09-09: 30 mg via INTRAVENOUS

## 2011-09-09 MED ORDER — POTASSIUM CHLORIDE IN NACL 20-0.45 MEQ/L-% IV SOLN
INTRAVENOUS | Status: DC
  Administered 2011-09-09 – 2011-09-10 (×3): via INTRAVENOUS
  Filled 2011-09-09 (×6): qty 1000

## 2011-09-09 MED ORDER — THROMBIN 5000 UNITS EX SOLR
CUTANEOUS | Status: DC | PRN
  Administered 2011-09-09: 5000 [IU] via TOPICAL

## 2011-09-09 MED ORDER — QUININE SULFATE 324 MG PO CAPS
324.0000 mg | ORAL_CAPSULE | Freq: Every evening | ORAL | Status: DC | PRN
  Administered 2011-09-12 – 2011-09-13 (×2): 324 mg via ORAL
  Filled 2011-09-09 (×2): qty 1

## 2011-09-09 MED ORDER — BISACODYL 10 MG RE SUPP: 10.0000 mg | Freq: Every day | RECTAL | Status: DC | PRN

## 2011-09-09 MED ORDER — VALSARTAN-HYDROCHLOROTHIAZIDE 80-12.5 MG PO TABS: 1.0000 | ORAL_TABLET | Freq: Every day | ORAL | Status: DC

## 2011-09-09 MED ORDER — THROMBIN 5000 UNITS EX SOLR
CUTANEOUS | Status: AC
  Filled 2011-09-09: qty 5000

## 2011-09-09 MED ORDER — MAGNESIUM CHLORIDE 64 MG PO TBEC
1.0000 | DELAYED_RELEASE_TABLET | Freq: Every day | ORAL | Status: DC
  Administered 2011-09-10 – 2011-09-15 (×6): 64 mg via ORAL
  Filled 2011-09-09 (×10): qty 1

## 2011-09-09 MED ORDER — HYDROCODONE-ACETAMINOPHEN 7.5-325 MG PO TABS
1.0000 | ORAL_TABLET | ORAL | Status: DC
  Administered 2011-09-09: 1 via ORAL
  Administered 2011-09-09: 2 via ORAL
  Administered 2011-09-09: 1 via ORAL
  Administered 2011-09-10: 2 via ORAL
  Administered 2011-09-10: 1 via ORAL
  Filled 2011-09-09 (×4): qty 2
  Filled 2011-09-09: qty 1

## 2011-09-09 MED ORDER — HYDROCHLOROTHIAZIDE 12.5 MG PO CAPS
12.5000 mg | ORAL_CAPSULE | Freq: Every day | ORAL | Status: DC
  Administered 2011-09-09 – 2011-09-10 (×2): 12.5 mg via ORAL
  Filled 2011-09-09 (×2): qty 1

## 2011-09-09 MED ORDER — VANCOMYCIN HCL IN DEXTROSE 1-5 GM/200ML-% IV SOLN
1000.0000 mg | Freq: Two times a day (BID) | INTRAVENOUS | Status: AC
  Administered 2011-09-09: 1000 mg via INTRAVENOUS
  Filled 2011-09-09: qty 200

## 2011-09-09 MED ORDER — POTASSIUM CHLORIDE CRYS ER 20 MEQ PO TBCR
20.0000 meq | EXTENDED_RELEASE_TABLET | Freq: Two times a day (BID) | ORAL | Status: DC
Start: 2011-09-09 — End: 2011-09-15
  Administered 2011-09-09 – 2011-09-15 (×12): 20 meq via ORAL
  Filled 2011-09-09 (×14): qty 1

## 2011-09-09 MED ORDER — MAGNESIUM CHLORIDE 64 MG PO TBEC
1.0000 | DELAYED_RELEASE_TABLET | Freq: Two times a day (BID) | ORAL | Status: DC
  Filled 2011-09-09 (×2): qty 2

## 2011-09-09 MED ORDER — METOCLOPRAMIDE HCL 5 MG/ML IJ SOLN: 5.0000 mg | Freq: Three times a day (TID) | INTRAMUSCULAR | Status: DC | PRN

## 2011-09-09 MED ORDER — ENOXAPARIN SODIUM 30 MG/0.3ML ~~LOC~~ SOLN
30.0000 mg | Freq: Two times a day (BID) | SUBCUTANEOUS | Status: DC
  Administered 2011-09-10 – 2011-09-14 (×9): 30 mg via SUBCUTANEOUS
  Filled 2011-09-09 (×12): qty 0.3

## 2011-09-09 MED ORDER — BUPIVACAINE-EPINEPHRINE 0.5% -1:200000 IJ SOLN
INTRAMUSCULAR | Status: DC | PRN
  Administered 2011-09-09: 50 mL

## 2011-09-09 MED ORDER — MENTHOL 3 MG MT LOZG
1.0000 | LOZENGE | OROMUCOSAL | Status: DC | PRN
  Filled 2011-09-09: qty 9

## 2011-09-09 MED ORDER — LACTATED RINGERS IV SOLN
INTRAVENOUS | Status: DC | PRN
  Administered 2011-09-09 (×2): via INTRAVENOUS

## 2011-09-09 MED ORDER — SODIUM CHLORIDE 0.9 % IR SOLN
Status: DC | PRN
  Administered 2011-09-09: 08:00:00

## 2011-09-09 MED ORDER — METOCLOPRAMIDE HCL 10 MG PO TABS: 5.0000 mg | ORAL_TABLET | Freq: Three times a day (TID) | ORAL | Status: DC | PRN

## 2011-09-09 MED ORDER — ACETAMINOPHEN 10 MG/ML IV SOLN
INTRAVENOUS | Status: DC | PRN
  Administered 2011-09-09: 1000 mg via INTRAVENOUS

## 2011-09-09 SURGICAL SUPPLY — 70 items
APL SKNCLS STERI-STRIP NONHPOA (GAUZE/BANDAGES/DRESSINGS) ×1
BAG SPEC THK2 15X12 ZIP CLS (MISCELLANEOUS) ×1
BAG ZIPLOCK 12X15 (MISCELLANEOUS) ×2 IMPLANT
BANDAGE ELASTIC 4 VELCRO ST LF (GAUZE/BANDAGES/DRESSINGS) ×2 IMPLANT
BANDAGE ELASTIC 6 VELCRO ST LF (GAUZE/BANDAGES/DRESSINGS) ×2 IMPLANT
BANDAGE ESMARK 6X9 LF (GAUZE/BANDAGES/DRESSINGS) ×1 IMPLANT
BENZOIN TINCTURE PRP APPL 2/3 (GAUZE/BANDAGES/DRESSINGS) ×1 IMPLANT
BLADE SAG 18X100X1.27 (BLADE) ×2 IMPLANT
BLADE SAW SGTL 13.0X1.19X90.0M (BLADE) ×2 IMPLANT
BNDG CMPR 9X6 STRL LF SNTH (GAUZE/BANDAGES/DRESSINGS) ×1
BNDG COHESIVE 6X5 TAN STRL LF (GAUZE/BANDAGES/DRESSINGS) ×1 IMPLANT
BNDG ESMARK 6X9 LF (GAUZE/BANDAGES/DRESSINGS) ×2
CEMENT HV SMART SET (Cement) ×4 IMPLANT
CHLORAPREP W/TINT 26ML (MISCELLANEOUS) IMPLANT
CLOTH BEACON ORANGE TIMEOUT ST (SAFETY) ×2 IMPLANT
CLSR STERI-STRIP ANTIMIC 1/2X4 (GAUZE/BANDAGES/DRESSINGS) ×1 IMPLANT
CUFF TOURN SGL QUICK 34 (TOURNIQUET CUFF)
CUFF TOURN SGL QUICK 44 (TOURNIQUET CUFF) ×1 IMPLANT
CUFF TRNQT CYL 34X4X40X1 (TOURNIQUET CUFF) ×1 IMPLANT
DECANTER SPIKE VIAL GLASS SM (MISCELLANEOUS) ×2 IMPLANT
DRAPE INCISE IOBAN 66X45 STRL (DRAPES) ×1 IMPLANT
DRAPE LG THREE QUARTER DISP (DRAPES) ×2 IMPLANT
DRAPE ORTHO SPLIT 77X108 STRL (DRAPES) ×4
DRAPE POUCH INSTRU U-SHP 10X18 (DRAPES) ×2 IMPLANT
DRAPE SURG ORHT 6 SPLT 77X108 (DRAPES) ×2 IMPLANT
DRAPE U-SHAPE 47X51 STRL (DRAPES) ×2 IMPLANT
DRSG ADAPTIC 3X8 NADH LF (GAUZE/BANDAGES/DRESSINGS) ×2 IMPLANT
DRSG EMULSION OIL 3X16 NADH (GAUZE/BANDAGES/DRESSINGS) ×1 IMPLANT
DRSG PAD ABDOMINAL 8X10 ST (GAUZE/BANDAGES/DRESSINGS) ×2 IMPLANT
DURAPREP 26ML APPLICATOR (WOUND CARE) ×2 IMPLANT
ELECT REM PT RETURN 9FT ADLT (ELECTROSURGICAL) ×2
ELECTRODE REM PT RTRN 9FT ADLT (ELECTROSURGICAL) ×1 IMPLANT
EVACUATOR 1/8 PVC DRAIN (DRAIN) ×2 IMPLANT
FACESHIELD LNG OPTICON STERILE (SAFETY) ×10 IMPLANT
GLOVE BIO SURGEON STRL SZ 6.5 (GLOVE) ×2 IMPLANT
GLOVE BIOGEL PI IND STRL 8 (GLOVE) ×1 IMPLANT
GLOVE BIOGEL PI INDICATOR 8 (GLOVE) ×1
GLOVE ECLIPSE 6.5 STRL STRAW (GLOVE) ×2 IMPLANT
GLOVE INDICATOR 6.5 STRL GRN (GLOVE) ×2 IMPLANT
GLOVE SURG SS PI 8.0 STRL IVOR (GLOVE) ×4 IMPLANT
GOWN PREVENTION PLUS LG XLONG (DISPOSABLE) ×2 IMPLANT
GOWN PREVENTION PLUS XLARGE (GOWN DISPOSABLE) ×3 IMPLANT
GOWN STRL REIN XL XLG (GOWN DISPOSABLE) ×2 IMPLANT
HANDPIECE INTERPULSE COAX TIP (DISPOSABLE) ×2
IMMOBILIZER KNEE 20 (SOFTGOODS) ×2
IMMOBILIZER KNEE 20 THIGH 36 (SOFTGOODS) ×1 IMPLANT
KIT BASIN OR (CUSTOM PROCEDURE TRAY) ×2 IMPLANT
MANIFOLD NEPTUNE II (INSTRUMENTS) ×2 IMPLANT
NS IRRIG 1000ML POUR BTL (IV SOLUTION) ×2 IMPLANT
PACK TOTAL JOINT (CUSTOM PROCEDURE TRAY) ×2 IMPLANT
PADDING CAST COTTON 6X4 STRL (CAST SUPPLIES) ×2 IMPLANT
POSITIONER SURGICAL ARM (MISCELLANEOUS) ×2 IMPLANT
SET HNDPC FAN SPRY TIP SCT (DISPOSABLE) ×1 IMPLANT
SPONGE GAUZE 4X4 12PLY (GAUZE/BANDAGES/DRESSINGS) ×1 IMPLANT
SPONGE LAP 18X18 X RAY DECT (DISPOSABLE) ×1 IMPLANT
SPONGE SURGIFOAM ABS GEL 100 (HEMOSTASIS) ×2 IMPLANT
STAPLER VISISTAT (STAPLE) ×2 IMPLANT
SUCTION FRAZIER 12FR DISP (SUCTIONS) ×2 IMPLANT
SUT BONE WAX W31G (SUTURE) ×2 IMPLANT
SUT MNCRL AB 4-0 PS2 18 (SUTURE) ×1 IMPLANT
SUT VIC AB 1 CT1 27 (SUTURE)
SUT VIC AB 1 CT1 27XBRD ANTBC (SUTURE) ×4 IMPLANT
SUT VIC AB 2-0 CT1 27 (SUTURE) ×6
SUT VIC AB 2-0 CT1 TAPERPNT 27 (SUTURE) ×3 IMPLANT
SUT VLOC 180 0 24IN GS25 (SUTURE) ×2 IMPLANT
SYR 30ML LL (SYRINGE) ×2 IMPLANT
TOWER CARTRIDGE SMART MIX (DISPOSABLE) ×2 IMPLANT
TRAY FOLEY CATH 14FRSI W/METER (CATHETERS) ×2 IMPLANT
WATER STERILE IRR 1500ML POUR (IV SOLUTION) ×2 IMPLANT
WRAP KNEE MAXI GEL POST OP (GAUZE/BANDAGES/DRESSINGS) ×2 IMPLANT

## 2011-09-09 NOTE — Progress Notes (Signed)
Clinical Social Work Department BRIEF PSYCHOSOCIAL ASSESSMENT 09/09/2011  Patient:  [name], [name]     Account Number:  [id number]     Admit date:  09/09/2011  Clinical Social Worker:  Candie Chroman  Date/Time:  09/09/2011 04:32 PM  Referred by:  Physician  Date Referred:  09/09/2011 Referred for  SNF Placement   Other Referral:   Interview type:  Patient Other interview type:    PSYCHOSOCIAL DATA Living Status:  ALONE Admitted from facility:   Level of care:   Primary support name:  Astrid Drafts Primary support relationship to patient:  FRIEND Degree of support available:   supportive    CURRENT CONCERNS Current Concerns  Post-Acute Placement   Other Concerns:    SOCIAL WORK ASSESSMENT / PLAN Pt is a 51 yr old female living at home prior to hospitalization. CSW met with pt and S. Lackey to assist with d/c planning. Pt had originally been interested in CIR but now declines consult. Pt has made arrangements to have ST SNF placement at [facility]. SNF has confirmed plan pending BCBS approval. CSW will assist with d/c planning to SNF.   Assessment/plan status:  Psychosocial Support/Ongoing Assessment of Needs Other assessment/ plan:   Information/referral to community resources:    PATIENT'S/FAMILY'S RESPONSE TO PLAN OF CARE: Pt plans to have ST Rehab at [facility]     Erin Razor  LCSW  [phone]

## 2011-09-09 NOTE — Brief Op Note (Signed)
09/09/2011  10:16 AM  PATIENT:  [name]  51 y.o. female  PRE-OPERATIVE DIAGNOSIS:  ortheoarthritis of the Left Knee  POST-OPERATIVE DIAGNOSIS:  ortheoarthritis of the Left Knee  PROCEDURE:  Procedure(s) (LRB): TOTAL KNEE ARTHROPLASTY (Left)  SURGEON:  Surgeon(s) and Role:    * Javier Docker, MD - Primary  PHYSICIAN ASSISTANT:   ASSISTANTS: strader   ANESTHESIA:   general  EBL:  Total I/O In: 1000 [I.V.:1000] Out: 200 [Urine:200]  BLOOD ADMINISTERED:none  DRAINS: (1) Hemovact drain(s) in the o with  Suction Open   LOCAL MEDICATIONS USED:  MARCAINE     SPECIMEN:  No Specimen  DISPOSITION OF SPECIMEN:  N/A  COUNTS:  YES  TOURNIQUET:   Total Tourniquet Time Documented: Thigh (Left) - 121 minutes  DICTATION: .Other Dictation: Dictation Number  [id number]     PLAN OF CARE: Admit to inpatient   PATIENT DISPOSITION:  PACU - hemodynamically stable.   Delay start of Pharmacological VTE agent (>24hrs) due to surgical blood loss or risk of bleeding: no

## 2011-09-09 NOTE — Progress Notes (Signed)
Left knee xrays done per Dr. Ermelinda Das order

## 2011-09-09 NOTE — Progress Notes (Signed)
ANTICOAGULATION CONSULT NOTE - Initial Consult  Pharmacy Consult for Coumadin  Indication: VTE prophylaxis  Allergies  Allergen Reactions  . Beta Adrenergic Blockers Other (See Comments)    RED EYES AND CONGESTION  . Penicillins Hives, Itching and Swelling  . Sulfa Antibiotics Hives    RED EYES  . Thimerosal Hives and Itching   Patient Measurements:    Vital Signs: Temp: 98.1 F (36.7 C) (05/22 1430) Temp src: Oral (05/22 1430) BP: 124/83 mmHg (05/22 1430) Pulse Rate: 79  (05/22 1430)  Labs:  Basename 09/09/11 1330 09/09/11 0617  HGB 11.0* --  HCT 32.7* --  PLT -- --  APTT -- --  LABPROT -- 13.4  INR -- 1.00  HEPARINUNFRC -- --  CREATININE -- --  CKTOTAL -- --  CKMB -- --  TROPONINI -- --   The CrCl is unknown because both a height and weight (above a minimum accepted value) are required for this calculation.  Medical History: Past Medical History  Diagnosis Date  . Hypertension   . Arthritis   . Asthma   . Morbid obesity   . PE (pulmonary embolism)   . DVT (deep venous thrombosis)   . GERD (gastroesophageal reflux disease)   . Dysfunctional uterine bleeding   . Headache     MIGRAINES IN PAST  . Sleep apnea, obstructive     USES C-PAP  . Glaucoma    Medications:  Prescriptions prior to admission  Medication Sig Dispense Refill  . b complex vitamins tablet Take 1 tablet by mouth daily.      . bimatoprost (LUMIGAN) 0.01 % SOLN Place 1 drop into the left eye at bedtime.      . brimonidine (ALPHAGAN) 0.2 % ophthalmic solution Place 1 drop into both eyes 3 (three) times daily.      . brinzolamide (AZOPT) 1 % ophthalmic suspension Place 1 drop into the left eye 3 (three) times daily.      . cyclobenzaprine (FLEXERIL) 10 MG tablet Take 10 mg by mouth at bedtime as needed. FOR MUSCLE SPASMS       . diltiazem (DILACOR XR) 240 MG 24 hr capsule Take 240 mg by mouth at bedtime.      Marland Kitchen esomeprazole (NEXIUM) 40 MG capsule Take 40 mg by mouth daily before  breakfast.      . fexofenadine (ALLEGRA) 180 MG tablet Take 180 mg by mouth daily with breakfast.      . gabapentin (NEURONTIN) 100 MG capsule Take 100 mg by mouth 3 (three) times daily.      . magnesium chloride (SLOW-MAG) 64 MG TBEC Take 1-2 tablets by mouth 2 (two) times daily. Patient takes 1 tablet every morning and 2 tablets by mouth every evening      . methocarbamol (ROBAXIN) 500 MG tablet Take 500 mg by mouth 3 (three) times daily as needed. For pain       . potassium chloride SA (K-DUR,KLOR-CON) 20 MEQ tablet Take 20 mEq by mouth 2 (two) times daily. Two in am and 1 at dinner      . PRESCRIPTION MEDICATION Take 1 puff by mouth 2 (two) times daily. Alvesco inhaler      . PRESCRIPTION MEDICATION Take by mouth at bedtime. Dilaresp=allergy medication      . Probiotic Product (PROBIOTIC FORMULA PO) Take 1 tablet by mouth daily.       Marland Kitchen Propylene Glycol (SYSTANE BALANCE) 0.6 % SOLN Apply 1 drop to eye 3 (three) times daily. In right eye      .  quiNINE (QUALAQUIN) 324 MG capsule Take 324 mg by mouth at bedtime as needed.       . traMADol (ULTRAM) 50 MG tablet Take 50 mg by mouth every 6 (six) hours as needed. For pain       . valsartan-hydrochlorothiazide (DIOVAN-HCT) 80-12.5 MG per tablet Take 1 tablet by mouth daily with breakfast.       . celecoxib (CELEBREX) 100 MG capsule Take 200 mg by mouth daily with breakfast.       . cyanocobalamin 1000 MCG tablet Take 2,500 mcg by mouth daily.      . diclofenac sodium (VOLTAREN) 1 % GEL Apply 1 application topically 4 (four) times daily as needed. For knee pain      . fish oil-omega-3 fatty acids 1000 MG capsule Take 2 g by mouth daily.      . Multiple Vitamin (MULTIVITAMIN) tablet Take 2 tablets by mouth at bedtime.       . niacin 500 MG tablet Take 500 mg by mouth daily with breakfast.      . warfarin (COUMADIN) 10 MG tablet Take 5-10 mg by mouth every evening. 10mg  3days per week, 5mg  for 4 days per week       Assessment:  [name] on chronic  Coumadin for hx PE/DVT.  Resuming Coumadin post L TKA.  Lovenox 30mg  SQ q12h ordered for bridging.  Goal of Therapy:  INR 2-3   Plan:   Coumadin 10mg  today using home regimen as a guide.  F/u daily INR.  Charolotte Eke, PharmD, pager [phone]. 09/09/2011,2:57 PM.

## 2011-09-09 NOTE — Anesthesia Preprocedure Evaluation (Signed)
Anesthesia Evaluation  Patient identified by MRN, date of birth, ID band Patient awake    Reviewed: Allergy & Precautions, H&P , NPO status , Patient's Chart, lab work & pertinent test results  Airway Mallampati: II TM Distance: >3 FB Neck ROM: Full    Dental No notable dental hx.    Pulmonary neg pulmonary ROS, sleep apnea ,  breath sounds clear to auscultation  Pulmonary exam normal       Cardiovascular hypertension, Pt. on medications negative cardio ROS  Rhythm:Regular Rate:Normal     Neuro/Psych CVA negative psych ROS   GI/Hepatic negative GI ROS, Neg liver ROS, (+) Hepatitis -  Endo/Other  negative endocrine ROSDiabetes mellitus-Morbid obesity  Renal/GU negative Renal ROS  negative genitourinary   Musculoskeletal negative musculoskeletal ROS (+)   Abdominal   Peds negative pediatric ROS (+)  Hematology negative hematology ROS (+)   Anesthesia Other Findings   Reproductive/Obstetrics negative OB ROS                           Anesthesia Physical Anesthesia Plan  ASA: III  Anesthesia Plan: General   Post-op Pain Management:    Induction: Intravenous  Airway Management Planned: Oral ETT  Additional Equipment:   Intra-op Plan:   Post-operative Plan: Extubation in OR  Informed Consent: I have reviewed the patients History and Physical, chart, labs and discussed the procedure including the risks, benefits and alternatives for the proposed anesthesia with the patient or authorized representative who has indicated his/her understanding and acceptance.   Dental advisory given  Plan Discussed with: CRNA  Anesthesia Plan Comments:         Anesthesia Quick Evaluation

## 2011-09-09 NOTE — Anesthesia Postprocedure Evaluation (Signed)
  Anesthesia Post-op Note  Patient: [name]  Procedure(s) Performed: Procedure(s) (LRB): TOTAL KNEE ARTHROPLASTY (Left)  Patient Location: PACU  Anesthesia Type: General  Level of Consciousness: awake and alert   Airway and Oxygen Therapy: Patient Spontanous Breathing  Post-op Pain: mild  Post-op Assessment: Post-op Vital signs reviewed, Patient's Cardiovascular Status Stable, Respiratory Function Stable, Patent Airway and No signs of Nausea or vomiting  Post-op Vital Signs: stable  Complications: No apparent anesthesia complications

## 2011-09-09 NOTE — Interval H&P Note (Signed)
History and Physical Interval Note:  09/09/2011 10:16 AM  [name]  has presented today for surgery, with the diagnosis of ortheoarthritis of the Left Knee  The various methods of treatment have been discussed with the patient and family. After consideration of risks, benefits and other options for treatment, the patient has consented to  Procedure(s) (LRB): TOTAL KNEE ARTHROPLASTY (Left) as a surgical intervention .  The patients' history has been reviewed, patient examined, no change in status, stable for surgery.  I have reviewed the patients' chart and labs.  Questions were answered to the patient's satisfaction.     Sherryn Pollino C

## 2011-09-09 NOTE — Transfer of Care (Signed)
Immediate Anesthesia Transfer of Care Note  Patient: [name]  Procedure(s) Performed: Procedure(s) (LRB): TOTAL KNEE ARTHROPLASTY (Left)  Patient Location: PACU  Anesthesia Type: General  Level of Consciousness: awake, alert  and patient cooperative  Airway & Oxygen Therapy: Patient Spontanous Breathing and Patient connected to face mask oxygen  Post-op Assessment: Report given to PACU RN, Post -op Vital signs reviewed and stable and Patient moving all extremities  Post vital signs: Reviewed and stable  Complications: No apparent anesthesia complications

## 2011-09-09 NOTE — Progress Notes (Signed)
Thank you for consult on [name]. I have spoken to patient prior to surgery about issues with insurance regarding elective knee surgery.  We'll need to see progression with PT to determine appropriate rehab needs.  Will follow up tomorrow after PT/OT evaluations.

## 2011-09-09 NOTE — Progress Notes (Signed)
Upon arrival pt already on CPAP, tolerating well. RT will assist as needed.

## 2011-09-09 NOTE — Progress Notes (Signed)
Dr. Shelle Iron made aware of increased hemovac drainage; orders rec'd, hemovac clamped, labs drawn, will hold CPM until tomorrow

## 2011-09-10 ENCOUNTER — Inpatient Hospital Stay (HOSPITAL_COMMUNITY): Payer: BC Managed Care – PPO

## 2011-09-10 ENCOUNTER — Encounter (HOSPITAL_COMMUNITY): Payer: Self-pay | Admitting: Specialist

## 2011-09-10 DIAGNOSIS — R209 Unspecified disturbances of skin sensation: Secondary | ICD-10-CM

## 2011-09-10 DIAGNOSIS — Z96659 Presence of unspecified artificial knee joint: Secondary | ICD-10-CM

## 2011-09-10 DIAGNOSIS — Z96652 Presence of left artificial knee joint: Secondary | ICD-10-CM | POA: Diagnosis present

## 2011-09-10 DIAGNOSIS — M1710 Unilateral primary osteoarthritis, unspecified knee: Secondary | ICD-10-CM

## 2011-09-10 DIAGNOSIS — N179 Acute kidney failure, unspecified: Secondary | ICD-10-CM | POA: Diagnosis present

## 2011-09-10 DIAGNOSIS — R202 Paresthesia of skin: Secondary | ICD-10-CM | POA: Diagnosis not present

## 2011-09-10 DIAGNOSIS — I959 Hypotension, unspecified: Secondary | ICD-10-CM

## 2011-09-10 DIAGNOSIS — R112 Nausea with vomiting, unspecified: Secondary | ICD-10-CM

## 2011-09-10 DIAGNOSIS — D649 Anemia, unspecified: Secondary | ICD-10-CM | POA: Diagnosis present

## 2011-09-10 LAB — BASIC METABOLIC PANEL
BUN: 13 mg/dL (ref 6–23)
CO2: 23 mEq/L (ref 19–32)
Calcium: 8.2 mg/dL — ABNORMAL LOW (ref 8.4–10.5)
Chloride: 102 mEq/L (ref 96–112)
Creatinine, Ser: 1.89 mg/dL — ABNORMAL HIGH (ref 0.50–1.10)
GFR calc Af Amer: 34 mL/min — ABNORMAL LOW (ref >90)
GFR calc non Af Amer: 29 mL/min — ABNORMAL LOW (ref >90)
Glucose, Bld: 161 mg/dL — ABNORMAL HIGH (ref 70–99)
Potassium: 4.1 mEq/L (ref 3.5–5.1)
Sodium: 134 mEq/L — ABNORMAL LOW (ref 135–145)

## 2011-09-10 LAB — CBC
HCT: 25.2 % — ABNORMAL LOW (ref 36.0–46.0)
Hemoglobin: 8.7 g/dL — ABNORMAL LOW (ref 12.0–15.0)
MCH: 27.3 pg (ref 26.0–34.0)
MCHC: 33.9 g/dL (ref 30.0–36.0)
MCV: 80.6 fL (ref 78.0–100.0)
Platelets: 120 10*3/uL — ABNORMAL LOW (ref 150–400)
RBC: 3.19 MIL/uL — ABNORMAL LOW (ref 3.87–5.11)
RDW: 15 % (ref 11.5–15.5)
WBC: 7.8 10*3/uL (ref 4.0–10.5)

## 2011-09-10 LAB — PROTIME-INR
INR: 1.25 (ref 0.00–1.49)
Prothrombin Time: 16 seconds — ABNORMAL HIGH (ref 11.6–15.2)

## 2011-09-10 LAB — PREPARE RBC (CROSSMATCH)

## 2011-09-10 MED ORDER — OXYCODONE HCL 5 MG PO TABS: 10.0000 mg | ORAL_TABLET | ORAL | Status: DC

## 2011-09-10 MED ORDER — WARFARIN SODIUM 10 MG PO TABS
10.0000 mg | ORAL_TABLET | Freq: Once | ORAL | Status: AC
  Administered 2011-09-10: 10 mg via ORAL
  Filled 2011-09-10: qty 1

## 2011-09-10 MED ORDER — OXYCODONE HCL 5 MG PO TABS
5.0000 mg | ORAL_TABLET | ORAL | Status: DC | PRN
  Administered 2011-09-10 – 2011-09-15 (×26): 5 mg via ORAL
  Filled 2011-09-10 (×27): qty 1

## 2011-09-10 NOTE — Progress Notes (Signed)
Physical Therapy Treatment Patient Details Name: [name] MRN: [id number] DOB: 1960-04-27 Today's Date: 09/10/2011 Time: 7829-5621 PT Time Calculation (min): 13 min  PT Assessment / Plan / Recommendation Comments on Treatment Session       Follow Up Recommendations  Skilled nursing facility    Barriers to Discharge        Equipment Recommendations  Defer to next venue    Recommendations for Other Services OT consult  Frequency 7X/week   Plan Discharge plan remains appropriate    Precautions / Restrictions Precautions Precautions: Knee;Other (comment) (L KI w/ Amb until able to perform SLR) Required Braces or Orthoses: Knee Immobilizer - Left Knee Immobilizer - Left: Discontinue once straight leg raise with < 10 degree lag Restrictions Weight Bearing Restrictions: No Other Position/Activity Restrictions: L WBAT   Pertinent Vitals/Pain 9/10 - RN aware    Mobility  Bed Mobility Bed Mobility: Sit to Supine Supine to Sit: 1: +2 Total assist Supine to Sit: Patient Percentage: 50% Details for Bed Mobility Assistance: cues for sequence and for use of UEs and R LE to self-assist Transfers Transfers: Sit to Stand;Stand to Sit Sit to Stand: 1: +2 Total assist;With upper extremity assist;From chair/3-in-1;From bed;With armrests Sit to Stand: Patient Percentage:  (Pt 60-70 % w/ increased time & VC's safety) Stand to Sit: 1: +2 Total assist;To bed;With upper extremity assist Stand to Sit: Patient Percentage: 60% Details for Transfer Assistance: Pt is overall +2 total assist w/ VC's for safety & sequencing; pt 60-70% Ambulation/Gait Ambulation/Gait Assistance: 1: +2 Total assist Ambulation/Gait: Patient Percentage: 70% Ambulation Distance (Feet): 5 Feet Assistive device: Rolling walker Ambulation/Gait Assistance Details: cues for sequence, posture and position from RW Gait Pattern: Step-to pattern    Exercises Total Joint Exercises Ankle Circles/Pumps: AROM;15  reps;Supine;Both Quad Sets: AROM;10 reps;Supine;Both Heel Slides: AAROM;Supine;15 reps;Left Straight Leg Raises: AAROM;10 reps;Supine;Left   PT Diagnosis: Difficulty walking  PT Problem List: Decreased strength;Decreased range of motion;Decreased activity tolerance;Decreased mobility;Decreased knowledge of use of DME;Pain;Obesity PT Treatment Interventions: DME instruction;Gait training;Stair training;Functional mobility training;Therapeutic activities;Therapeutic exercise;Patient/family education   PT Goals Acute Rehab PT Goals PT Goal Formulation: With patient Time For Goal Achievement: 09/17/11 Potential to Achieve Goals: Good Pt will go Supine/Side to Sit: with min assist PT Goal: Supine/Side to Sit - Progress: Goal set today Pt will go Sit to Supine/Side: with min assist PT Goal: Sit to Supine/Side - Progress: Goal set today Pt will go Sit to Stand: with min assist PT Goal: Sit to Stand - Progress: Goal set today Pt will go Stand to Sit: with min assist PT Goal: Stand to Sit - Progress: Goal set today Pt will Ambulate: 51 - 150 feet;with min assist;with rolling walker PT Goal: Ambulate - Progress: Goal set today  Visit Information  Last PT Received On: 09/10/11 Assistance Needed: +2    Subjective Data  Subjective: I want to lie down Patient Stated Goal: Resume previous lifestyle with decreased pain   Cognition  Overall Cognitive Status: Appears within functional limits for tasks assessed/performed Arousal/Alertness: Lethargic Orientation Level: Appears intact for tasks assessed Behavior During Session: [facility] for tasks performed Cognition - Other Comments: Lethergy ? related to meds; Pt BP also low 86/60 noted    Balance     End of Session PT - End of Session Equipment Utilized During Treatment: Left knee immobilizer Activity Tolerance: Patient limited by fatigue;Patient limited by pain Patient left: in bed;with call bell/phone within reach Nurse Communication:  Mobility status    Kedron Uno 09/10/2011,  12:10 PM

## 2011-09-10 NOTE — Consult Note (Addendum)
Physical Medicine and Rehabilitation Consult Reason for Consult: DJD Left knee Referring Physician:  Dr. Shelle Iron.    HPI: [name] is an 51 y.o. Female with history of morbid obesity, OSA, endstage DJD left knee, who elected to undergo L-TKR on 05/23 by Dr Shelle Iron.  Post op WBAT and chronic coumadin resumed.  For PT/OT evaluations today.  Patient requesting CIR.patient was a previous inpatient on the rehabilitation unit in the fall 2012 following a cervical decompression and fusion for myelopathy. She was able to participate in the program and went home at a modified independent level.  The patient has a sister that is available for supervision starting June 4 Review of Systems  Eyes: Negative for blurred vision and double vision.  Respiratory: Negative for hemoptysis and shortness of breath.   Cardiovascular: Negative for chest pain and palpitations.  Gastrointestinal: Negative for heartburn, nausea and abdominal pain.  Genitourinary: Negative for dysuria and urgency.  Musculoskeletal: Positive for joint pain.  Neurological: Negative for dizziness and headaches.   Past Medical History  Diagnosis Date  . Hypertension   . Arthritis   . Asthma   . Morbid obesity   . PE (pulmonary embolism)   . DVT (deep venous thrombosis)   . GERD (gastroesophageal reflux disease)   . Dysfunctional uterine bleeding   . Headache     MIGRAINES IN PAST  . Sleep apnea, obstructive     USES C-PAP  . Glaucoma    Past Surgical History  Procedure Date  . Posterior laminectomy / decompression cervical spine 01/26/2011  . Glaucoma surgery 10/31/2009  . Gastric bypass [id number]  . Knee arthroscopy 12/2007  . Dilation and curettage of uterus   . Hysteroscopy     X2  . Eye surgery 11/16/2009    FOR GLAUCOMA AND LASER SURGERY  X2  . Cataract surg   . Ivc filter     2010 - THEN REMOVED THEN PLACED AGAIN 2012   Family History  Problem Relation Age of Onset  . Glaucoma    . Cerebral aneurysm    .  Stroke    . Arthritis Mother   . Hypertension Mother   . Diabetes Mother   . Arthritis Father   . Diabetes Father   . Alcohol abuse Father    Social History: Single. Lives alone.  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 She  reports that she has never smoked. She has never used smokeless tobacco. She reports that she does not drink alcohol or use illicit drugs.   Allergies  Allergen Reactions  . Beta Adrenergic Blockers Other (See Comments)    RED EYES AND CONGESTION  . Penicillins Hives, Itching and Swelling  . Sulfa Antibiotics Hives    RED EYES  . Thimerosal Hives and Itching   Medications Prior to Admission  Medication Sig Dispense Refill  . b complex vitamins tablet Take 1 tablet by mouth daily.      . bimatoprost (LUMIGAN) 0.01 % SOLN Place 1 drop into the left eye at bedtime.      . brimonidine (ALPHAGAN) 0.2 % ophthalmic solution Place 1 drop into both eyes 3 (three) times daily.      . brinzolamide (AZOPT) 1 % ophthalmic suspension Place 1 drop into the left eye 3 (three) times daily.      . cyclobenzaprine (FLEXERIL) 10 MG tablet Take 10 mg by mouth at bedtime as needed. FOR MUSCLE SPASMS       . diltiazem (DILACOR XR) 240  MG 24 hr capsule Take 240 mg by mouth at bedtime.      Marland Kitchen esomeprazole (NEXIUM) 40 MG capsule Take 40 mg by mouth daily before breakfast.      . fexofenadine (ALLEGRA) 180 MG tablet Take 180 mg by mouth daily with breakfast.      . gabapentin (NEURONTIN) 100 MG capsule Take 100 mg by mouth 3 (three) times daily.      . magnesium chloride (SLOW-MAG) 64 MG TBEC Take 1-2 tablets by mouth 2 (two) times daily. Patient takes 1 tablet every morning and 2 tablets by mouth every evening      . methocarbamol (ROBAXIN) 500 MG tablet Take 500 mg by mouth 3 (three) times daily as needed. For pain       . potassium chloride SA (K-DUR,KLOR-CON) 20 MEQ tablet Take 20 mEq by mouth 2 (two) times daily. Two in am and 1 at dinner      . PRESCRIPTION MEDICATION  Take 1 puff by mouth 2 (two) times daily. [place] inhaler      . PRESCRIPTION MEDICATION Take by mouth at bedtime. Dilaresp=allergy medication      . Probiotic Product (PROBIOTIC FORMULA PO) Take 1 tablet by mouth daily.       Marland Kitchen Propylene Glycol (SYSTANE BALANCE) 0.6 % SOLN Apply 1 drop to eye 3 (three) times daily. In right eye      . quiNINE (QUALAQUIN) 324 MG capsule Take 324 mg by mouth at bedtime as needed.       . traMADol (ULTRAM) 50 MG tablet Take 50 mg by mouth every 6 (six) hours as needed. For pain       . valsartan-hydrochlorothiazide (DIOVAN-HCT) 80-12.5 MG per tablet Take 1 tablet by mouth daily with breakfast.       . celecoxib (CELEBREX) 100 MG capsule Take 200 mg by mouth daily with breakfast.       . cyanocobalamin 1000 MCG tablet Take 2,500 mcg by mouth daily.      . diclofenac sodium (VOLTAREN) 1 % GEL Apply 1 application topically 4 (four) times daily as needed. For knee pain      . fish oil-omega-3 fatty acids 1000 MG capsule Take 2 g by mouth daily.      . Multiple Vitamin (MULTIVITAMIN) tablet Take 2 tablets by mouth at bedtime.       . niacin 500 MG tablet Take 500 mg by mouth daily with breakfast.      . warfarin (COUMADIN) 10 MG tablet Take 5-10 mg by mouth every evening. 10mg  3days per week, 5mg  for 4 days per week        Home:    Functional History:   Functional Status:  Mobility:          ADL:    Cognition: Cognition Orientation Level: Oriented X4    Blood pressure 112/78, pulse 105, temperature 98.3 F (36.8 C), temperature source Oral, resp. rate 17, height 5\' 4"  (1.626 m), weight 124.739 kg (275 lb), last menstrual period 05/28/2011, SpO2 100.00%. Physical Exam  Nursing note and vitals reviewed. Constitutional: She is oriented to person, place, and time. She appears well-developed and well-nourished.       Morbidly obese female  HENT:  Head: Normocephalic and atraumatic.  Eyes: Pupils are equal, round, and reactive to light.  Neck: Normal  range of motion. Neck supple.  Cardiovascular: Normal rate and regular rhythm.   Pulmonary/Chest: Effort normal and breath sounds normal.  Abdominal: Soft. Bowel sounds are normal.  Musculoskeletal: She exhibits tenderness.       Compressive dressing right knee.   Neurological: She is alert and oriented to person, place, and time.  left thigh has marked edema. Anterior thigh wound has a small amount of dark red blood oozing onto the dressing. Right side soft nontender Motor strength is 5/5 in bilateral deltoid, biceps, triceps, grip 4/5 right hip flexor knee extensor ankle dorsiflexor and plantar flexor Left thigh hip flexor 2 minus, knee extensor 2 minus, ankle dorsiflexor plantar flexor are 4 minus Sensation intact to light touch in the upper and lower extremities Mood and affect are appropriate  Results for orders placed during the hospital encounter of 09/09/11 (from the past 24 hour(s))  HEMOGLOBIN AND HEMATOCRIT, BLOOD     Status: Abnormal   Collection Time   09/09/11  1:30 PM      Component Value Range   Hemoglobin 11.0 (*) 12.0 - 15.0 (g/dL)   HCT 40.9 (*) 81.1 - 46.0 (%)  CBC     Status: Abnormal   Collection Time   09/10/11  4:05 AM      Component Value Range   WBC 7.8  4.0 - 10.5 (K/uL)   RBC 3.19 (*) 3.87 - 5.11 (MIL/uL)   Hemoglobin 8.7 (*) 12.0 - 15.0 (g/dL)   HCT 91.4 (*) 78.2 - 46.0 (%)   MCV 80.6  78.0 - 100.0 (fL)   MCH 27.3  26.0 - 34.0 (pg)   MCHC 33.9  30.0 - 36.0 (g/dL)   RDW 95.6  21.3 - 08.6 (%)   Platelets 120 (*) 150 - 400 (K/uL)  BASIC METABOLIC PANEL     Status: Abnormal   Collection Time   09/10/11  4:05 AM      Component Value Range   Sodium 134 (*) 135 - 145 (mEq/L)   Potassium 4.1  3.5 - 5.1 (mEq/L)   Chloride 102  96 - 112 (mEq/L)   CO2 23  19 - 32 (mEq/L)   Glucose, Bld 161 (*) 70 - 99 (mg/dL)   BUN 13  6 - 23 (mg/dL)   Creatinine, Ser 5.78 (*) 0.50 - 1.10 (mg/dL)   Calcium 8.2 (*) 8.4 - 10.5 (mg/dL)   GFR calc non Af Amer 29 (*) >90  (mL/min)   GFR calc Af Amer 34 (*) >90 (mL/min)  PROTIME-INR     Status: Abnormal   Collection Time   09/10/11  4:05 AM      Component Value Range   Prothrombin Time 16.0 (*) 11.6 - 15.2 (seconds)   INR 1.25  0.00 - 1.49    X-ray Knee Left Port  09/09/2011  *RADIOLOGY REPORT*  Clinical Data: Postop left knee arthroplasty.  PORTABLE LEFT KNEE - 1-2 VIEW  Comparison: 09/03/2011 radiographs.  Findings: The patient has undergone interval left total knee arthroplasty.  The hardware appears well positioned.  A surgical drain is in place.  A small amount of air is present within the joint and soft tissues surrounding the knee.  There is no evidence of acute fracture or subluxation.  IMPRESSION: No demonstrated complication related to left total knee arthroplasty.  Original Report Authenticated By: Gerrianne Scale, M.D.    Assessment/Plan: Diagnosis:left total knee replacement with postoperative complications in the setting of morbid obesity 1. Does the need for close, 24 hr/day medical supervision in concert with the patient's rehab needs make it unreasonable for this patient to be served in a less intensive setting? Yes 2. Co-Morbidities requiring supervision/potential complications: renal  failure, anemia, diabetes, morbid obesity BMI 47 3. Due to bowel management, safety, skin/wound care, disease management, medication administration and pain management, does the patient require 24 hr/day rehab nursing? Yes 4. Does the patient require coordinated care of a physician, rehab nurse, PT (1-2 hrs/day, 5 days/week) and OT (1-2 hrs/day, 5 days/week) to address physical and functional deficits in the context of the above medical diagnosis(es)? Potentially Addressing deficits in the following areas: balance, endurance, locomotion, strength, transferring, bathing, dressing and toileting 5. Can the patient actively participate in an intensive therapy program of at least 3 hrs of therapy per day at least 5 days  per week? Potentially 6. The potential for patient to make measurable gains while on inpatient rehab is good 7. Anticipated functional outcomes upon discharge from inpatient rehab are supervision mobility with PT, supervision ADLs with OT, not applicable with SLP. 8. Estimated rehab length of stay to reach the above functional goals is: 10-14 days 9. Does the patient have adequate social supports to accommodate these discharge functional goals? Potentially 10. Anticipated D/C setting: Home 11. Anticipated post D/C treatments: HH therapy 12. Overall Rehab/Functional Prognosis: good  RECOMMENDATIONS: This patient's condition is appropriate for continued rehabilitative care in the following setting: rehabilitation RN to followup on therapy tolerance. She was unable to history today due to hypotensive episode. If she is better able to state tomorrow then rehabilitation stay would be appropriate. Also need to confirm the availability of the patient's sister for 24-hour supervision post discharge Patient has agreed to participate in recommended program. Yes Note that insurance prior authorization may be required for reimbursement for recommended care.  Comment:   Jacquelynn Cree 09/10/2011

## 2011-09-10 NOTE — Consult Note (Addendum)
Requesting physician: Dr.Bean  Primary Care Physician: Geraldo Pitter, MD, MD  Reason for consultation: Medical management  History of Present Illness: [name] is a 51year old [place] American female with multiple medical problems namely history of DVT/PE on Coumadin, asthma, obstructive sleep apnea, obesity, history of cervical myelopathy status post decompression, osteoarthritis Underwent elective left knee arthroplasty per Dr. Jillyn Hidden yesterday, was doing relatively okay after waking up today, she experienced some numbness in both her hands as well as peri-oral area, which has slowly started improving. She denies any weakness in the upper extremities or lower extremities. In addition she was also noted to have a blood pressure of 86/60 this morning with a hemoglobin of 8.7 which is a significant drop from recent baseline. She has gotten her a.m. blood pressure medicines today She denies any fevers or chills, or shortness of breath, congestion, cough She reports mild wheezing today while working with physical therapy Allergies:   Allergies  Allergen Reactions  . Beta Adrenergic Blockers Other (See Comments)    RED EYES AND CONGESTION  . Penicillins Hives, Itching and Swelling  . Sulfa Antibiotics Hives    RED EYES  . Thimerosal Hives and Itching      Past Medical History  Diagnosis Date  . Hypertension   . Arthritis   . Asthma   . Morbid obesity   . PE (pulmonary embolism)   . DVT (deep venous thrombosis)   . GERD (gastroesophageal reflux disease)   . Dysfunctional uterine bleeding   . Headache     MIGRAINES IN PAST  . Sleep apnea, obstructive     USES C-PAP  . Glaucoma     Past Surgical History  Procedure Date  . Posterior laminectomy / decompression cervical spine 01/26/2011  . Glaucoma surgery 10/31/2009  . Gastric bypass [id number]  . Knee arthroscopy 12/2007  . Dilation and curettage of uterus   . Hysteroscopy     X2  . Eye surgery 11/16/2009    FOR  GLAUCOMA AND LASER SURGERY  X2  . Cataract surg   . Ivc filter     2010 - THEN REMOVED THEN PLACED AGAIN 2012  . Total knee arthroplasty 09/09/2011    Procedure: TOTAL KNEE ARTHROPLASTY;  Surgeon: Javier Docker, MD;  Location: [facility];  Service: Orthopedics;  Laterality: Left;    Scheduled Meds:   . bimatoprost  1 drop Left Eye QHS  . brimonidine  1 drop Both Eyes TID  . brinzolamide  1 drop Left Eye TID  . ciprofloxacin  400 mg Intravenous Q12H  . cyanocobalamin  2,500 mcg Oral Daily  . diltiazem  240 mg Oral QHS  . docusate sodium  100 mg Oral BID  . enoxaparin  30 mg Subcutaneous Q12H  . gabapentin  100 mg Oral TID  . HYDROmorphone      . HYDROmorphone      . HYDROmorphone      . ketorolac      . loratadine  10 mg Oral Daily  . magnesium chloride  2 tablet Oral QHS  . magnesium chloride  1 tablet Oral Daily  . niacin  500 mg Oral Q breakfast  . pantoprazole  80 mg Oral Q1200  . polyvinyl alcohol  1 drop Both Eyes TID  . potassium chloride SA  20 mEq Oral BID  . vancomycin  1,000 mg Intravenous Q12H  . warfarin  10 mg Oral ONCE-1800  . warfarin  10 mg Oral ONCE-1800  . Warfarin - Pharmacist  Dosing Inpatient   Does not apply q1800  . DISCONTD: hydrochlorothiazide  12.5 mg Oral Daily  . DISCONTD: HYDROcodone-acetaminophen  1-2 tablet Oral Q4H  . DISCONTD: irbesartan  75 mg Oral Daily  . DISCONTD: oxyCODONE  10 mg Oral Q3H  . DISCONTD: valsartan-hydrochlorothiazide  1 tablet Oral Q breakfast   Continuous Infusions:   . 0.45 % NaCl with KCl 20 mEq / L 100 mL/hr at 09/10/11 0534   PRN Meds:.acetaminophen, acetaminophen, bisacodyl, diphenhydrAMINE, HYDROmorphone (DILAUDID) injection, menthol-cetylpyridinium, methocarbamol (ROBAXIN) IV, methocarbamol, metoCLOPramide (REGLAN) injection, metoCLOPramide, ondansetron (ZOFRAN) IV, ondansetron, oxyCODONE, phenol, polyethylene glycol, quiNINE  Social History:  reports that she has never smoked. She has never used smokeless tobacco.  She reports that she does not drink alcohol or use illicit drugs.  Family History  Problem Relation Age of Onset  . Glaucoma    . Cerebral aneurysm    . Stroke    . Arthritis Mother   . Hypertension Mother   . Diabetes Mother   . Arthritis Father   . Diabetes Father   . Alcohol abuse Father     Review of Systems:  Constitutional: Denies fever, chills, diaphoresis, appetite change and fatigue.  HEENT: Denies photophobia, eye pain, redness, hearing loss, ear pain, congestion, sore throat, rhinorrhea, sneezing, mouth sores, trouble swallowing, neck pain, neck stiffness and tinnitus.   Respiratory: Denies SOB, DOE, cough, chest tightness,  and wheezing.   Cardiovascular: Denies chest pain, palpitations and leg swelling.  Gastrointestinal: Denies nausea, vomiting, abdominal pain, diarrhea, constipation, blood in stool and abdominal distention.  Genitourinary: Denies dysuria, urgency, frequency, hematuria, flank pain and difficulty urinating.  Musculoskeletal: Denies myalgias, back pain, joint swelling, arthralgias and gait problem.  Skin: Denies pallor, rash and wound.  Neurological: Denies dizziness, seizures, syncope, weakness, light-headedness, numbness and headaches.  Hematological: Denies adenopathy. Easy bruising, personal or family bleeding history  Psychiatric/Behavioral: Denies suicidal ideation, mood changes, confusion, nervousness, sleep disturbance and agitation   Physical Exam: Blood pressure 104/54, pulse 91, temperature 98.1 F (36.7 C), temperature source Oral, resp. rate 16, height 5\' 4"  (1.626 m), weight 124.739 kg (275 lb), last menstrual period 05/28/2011, SpO2 100.00%. Gen: AAOx3, no distress HEENT: PERRLA, EOMI CVS: S1/S2/RRR Lungs: CTAB Abd: soft, obese, NT, BS present Ext: no edema, L knee immobilized Neuro: Cranial nerves 2-12 intact Motor strength: 5/5 in all ext Sensory, slightly decreased light touch in Both hands Reflexes 1 plus Plantars:  downgoing  Labs on Admission:  Results for orders placed during the hospital encounter of 09/09/11 (from the past 48 hour(s))  PROTIME-INR     Status: Normal   Collection Time   09/09/11  6:17 AM      Component Value Range Comment   Prothrombin Time 13.4  11.6 - 15.2 (seconds)    INR 1.00  0.00 - 1.49    HCG, SERUM, QUALITATIVE     Status: Normal   Collection Time   09/09/11  6:17 AM      Component Value Range Comment   Preg, Serum NEGATIVE  NEGATIVE    TYPE AND SCREEN     Status: Normal   Collection Time   09/09/11  6:17 AM      Component Value Range Comment   ABO/RH(D) O POS      Antibody Screen NEG      Sample Expiration 09/12/2011     ABO/RH     Status: Normal   Collection Time   09/09/11  6:25 AM      Component  Value Range Comment   ABO/RH(D) O POS     HEMOGLOBIN AND HEMATOCRIT, BLOOD     Status: Abnormal   Collection Time   09/09/11  1:30 PM      Component Value Range Comment   Hemoglobin 11.0 (*) 12.0 - 15.0 (g/dL)    HCT 78.2 (*) 95.6 - 46.0 (%)   CBC     Status: Abnormal   Collection Time   09/10/11  4:05 AM      Component Value Range Comment   WBC 7.8  4.0 - 10.5 (K/uL)    RBC 3.19 (*) 3.87 - 5.11 (MIL/uL)    Hemoglobin 8.7 (*) 12.0 - 15.0 (g/dL)    HCT 21.3 (*) 08.6 - 46.0 (%)    MCV 80.6  78.0 - 100.0 (fL)    MCH 27.3  26.0 - 34.0 (pg)    MCHC 33.9  30.0 - 36.0 (g/dL)    RDW 57.8  46.9 - 62.9 (%)    Platelets 120 (*) 150 - 400 (K/uL) REPEATED TO VERIFY  BASIC METABOLIC PANEL     Status: Abnormal   Collection Time   09/10/11  4:05 AM      Component Value Range Comment   Sodium 134 (*) 135 - 145 (mEq/L)    Potassium 4.1  3.5 - 5.1 (mEq/L)    Chloride 102  96 - 112 (mEq/L)    CO2 23  19 - 32 (mEq/L)    Glucose, Bld 161 (*) 70 - 99 (mg/dL)    BUN 13  6 - 23 (mg/dL)    Creatinine, Ser 5.28 (*) 0.50 - 1.10 (mg/dL)    Calcium 8.2 (*) 8.4 - 10.5 (mg/dL)    GFR calc non Af Amer 29 (*) >90 (mL/min)    GFR calc Af Amer 34 (*) >90 (mL/min)   PROTIME-INR      Status: Abnormal   Collection Time   09/10/11  4:05 AM      Component Value Range Comment   Prothrombin Time 16.0 (*) 11.6 - 15.2 (seconds)    INR 1.25  0.00 - 1.49      Radiological Exams on Admission: Dg Chest Port 1 View  09/10/2011  *RADIOLOGY REPORT*  Clinical Data: Shortness of breath with exertion.  Recent knee surgery.  PORTABLE CHEST - 1 VIEW  Comparison: 01/26/2011.  Findings: 1444 hours.  Positioning is lordotic.  Heart size and mediastinal contours are stable.  The lungs are clear and there is no pleural effusion or pneumothorax.  The venous catheters have been removed in the interval.  IMPRESSION: No active cardiopulmonary process.  Original Report Authenticated By: Gerrianne Scale, M.D.   X-ray Knee Left Port  09/09/2011  *RADIOLOGY REPORT*  Clinical Data: Postop left knee arthroplasty.  PORTABLE LEFT KNEE - 1-2 VIEW  Comparison: 09/03/2011 radiographs.  Findings: The patient has undergone interval left total knee arthroplasty.  The hardware appears well positioned.  A surgical drain is in place.  A small amount of air is present within the joint and soft tissues surrounding the knee.  There is no evidence of acute fracture or subluxation.  IMPRESSION: No demonstrated complication related to left total knee arthroplasty.  Original Report Authenticated By: Gerrianne Scale, M.D.    Assessment/Plan 1. S/p L TKA per primary 2. Post op anemia: symptomatic with significant drop in Hb and slight drop in BP Will transfuse 1unit PRBC today May need more blood, check CBC in am 3. Paresthesias: appear to be improving, unclear  etiology, check B12,  Continue to Monitor, if worsens will pursue MRI C spine, given prior h/o cervical myelopathy 4. Mild hypotension: could be from 2 Transfuse 1unit now, hold HCTZ/ARB, cut down IVF Caution with narcotics 5. OSA: on CPAP 6. H/o DVT/PE, IVC filter, restarted on coumadin with lovenox bridge 7. ARF: could be from hypotension, volume expansion  with blood, hold diuretic/ARB, bmet in am  Thank you for consult, will follow with you  Time Spent on Consultation:  Oree Mirelez [facility]  [phone] 09/10/2011, 3:06 PM

## 2011-09-10 NOTE — Evaluation (Signed)
Occupational Therapy Evaluation Patient Details Name: [name] MRN: [id number] DOB: 01-04-1961 Today's Date: 09/10/2011 Time: 9629-5284 OT Time Calculation (min): 25 min  OT Assessment / Plan / Recommendation Clinical Impression  Pt is lethargic during asssessment. Overall req +1-2 total assist (pt 60-70%) for sit to stand from chair level, and LB ADL's & selfcare tasks. She requires consistent VC's for safety and sequencing & will benefit from OT acutely followed by SNF Rehab secondary to lives alone    OT Assessment  Patient needs continued OT Services    Follow Up Recommendations  Skilled nursing facility    Barriers to Discharge  (lives alone)    Equipment Recommendations  Defer to next venue    Recommendations for Other Services    Frequency  Min 2X/week    Precautions / Restrictions Precautions Precautions: Knee;Other (comment) (L KI w/ Amb until able to perform SLR) Required Braces or Orthoses: Knee Immobilizer - Left Restrictions Other Position/Activity Restrictions: L WBAT   Pertinent Vitals/Pain 10/10 Pain; RN tech aware and reports pt is premedicated; repositioned    ADL  Eating/Feeding: Performed;Modified independent Where Assessed - Eating/Feeding: Chair Grooming: Simulated;Modified independent Where Assessed - Grooming: Supported sitting Upper Body Bathing: Simulated;Set up;Minimal assistance Where Assessed - Upper Body Bathing: Supported sitting Lower Body Bathing: Simulated;Maximal assistance;+1 Total assistance Where Assessed - Lower Body Bathing: Supported sitting Upper Body Dressing: Performed;Minimal assistance Where Assessed - Upper Body Dressing: Unsupported sitting;Other (comment) (EOB) Lower Body Dressing: Performed;Maximal assistance Where Assessed - Lower Body Dressing: Supine, head of bed up Toilet Transfer: Simulated;+2 Total assistance Toilet Transfer Method: Sit to stand;Stand pivot Toilet Transfer Equipment: Extra wide bedside  commode;Other (comment) (RW; VC's for safety and sequencing) Toileting - Clothing Manipulation and Hygiene: Simulated;Maximal assistance Where Assessed - Toileting Clothing Manipulation and Hygiene: Sit to stand from 3-in-1 or toilet;Standing Tub/Shower Transfer Method: Not assessed Equipment Used: Rolling walker;Other (comment) (L KI) Transfers/Ambulation Related to ADLs: Pt very lethargic during transfers and amb related to ADL's; req +2 assist w/ pt 60-70% and consistent VC's for safety and sequencing ADL Comments: Pt is lethargic during asssessment. Overall req +2 total assist (pt 60-70%) for sit to stand from chair level, and LB ADL's & selfcare tasks. She requires consistent VC's for safety and sequencing & will benefit from OT acutely followed by SNF Rehab secondary to lives alone.    OT Diagnosis: Generalized weakness;Acute pain  OT Problem List: Decreased strength;Decreased activity tolerance;Decreased knowledge of use of DME or AE;Decreased knowledge of precautions;Pain;Obesity;Decreased safety awareness OT Treatment Interventions: Self-care/ADL training;Therapeutic exercise;DME and/or AE instruction;Therapeutic activities;Patient/family education   OT Goals Acute Rehab OT Goals OT Goal Formulation: With patient Potential to Achieve Goals: Good ADL Goals Pt Will Perform Grooming: with supervision;Standing at sink;Sitting at sink;Sitting, edge of bed;with set-up ADL Goal: Grooming - Progress: Goal set today Pt Will Perform Upper Body Bathing: with supervision;with set-up;Sitting, chair;Sitting, edge of bed;Sitting at sink ADL Goal: Upper Body Bathing - Progress: Goal set today Pt Will Perform Lower Body Bathing: with min assist;Sit to stand from bed;Sit to stand from chair;with adaptive equipment ADL Goal: Lower Body Bathing - Progress: Goal set today Pt Will Perform Upper Body Dressing: with set-up;Unsupported;Sitting, bed;Sitting, chair ADL Goal: Upper Body Dressing - Progress:  Goal set today Pt Will Perform Lower Body Dressing: with min assist;with supervision;with adaptive equipment;Sit to stand from chair;Sit to stand from bed ADL Goal: Lower Body Dressing - Progress: Goal set today Pt Will Transfer to Toilet: with supervision;Ambulation;with DME;Extra wide 3-in-1 ADL  Goal: Toilet Transfer - Progress: Goal set today Pt Will Perform Toileting - Clothing Manipulation: with modified independence;Standing ADL Goal: Toileting - Clothing Manipulation - Progress: Goal set today Pt Will Perform Toileting - Hygiene: with modified independence;Sitting on 3-in-1 or toilet ADL Goal: Toileting - Hygiene - Progress: Goal set today Pt Will Perform Tub/Shower Transfer: with supervision;Transfer tub bench;Ambulation;with DME ADL Goal: Tub/Shower Transfer - Progress: Goal set today  Visit Information  Last OT Received On: 09/10/11 Assistance Needed: +2 PT/OT Co-Evaluation/Treatment: Yes    Subjective Data  Subjective: I have had this before (re: OT)   Prior Functioning  Home Living Lives With: Alone Available Help at Discharge: Other (Comment) (Friend PRN cleaning) Type of Home: Apartment Home Access: Level entry Home Layout: One level Bathroom Shower/Tub: Engineer, manufacturing systems: Handicapped height (pt reports raised toilet seat) Bathroom Accessibility: Yes How Accessible: Accessible via walker Home Adaptive Equipment: Bedside commode/3-in-1;Reacher;Walker - rolling;Other (comment) (pt states has 3:1 that she uses in tub) Additional Comments: Pt reports that she uses 3:1 in tub Prior Function Level of Independence: Independent with assistive device(s) Driving: Yes Vocation: Other (comment) (Works from Home [employer]) Communication Communication: No difficulties Dominant Hand: Right    Cognition  Overall Cognitive Status: Appears within functional limits for tasks assessed/performed Arousal/Alertness: Lethargic Orientation Level: Appears intact for tasks  assessed Behavior During Session: [facility] for tasks performed Cognition - Other Comments: Lethergy ? related to meds; Pt BP also low 86/60 noted    Extremity/Trunk Assessment Right Upper Extremity Assessment RUE ROM/Strength/Tone: Within functional levels RUE Sensation: WFL - Light Touch Left Upper Extremity Assessment LUE ROM/Strength/Tone: Within functional levels LUE Sensation: WFL - Light Touch   Mobility Transfers Transfers: Sit to Stand Sit to Stand: 1: +2 Total assist;With upper extremity assist;From chair/3-in-1;From bed;With armrests Sit to Stand: Patient Percentage:  (Pt 60-70 % w/ increased time & VC's safety) Details for Transfer Assistance: Pt is overall +2 total assist w/ VC's for safety & sequencing; pt 60-70%           End of Session OT - End of Session Equipment Utilized During Treatment: Left knee immobilizer;Other (comment) (RW) Activity Tolerance: Patient limited by fatigue;Patient limited by pain Patient left: in bed;with call bell/phone within reach Nurse Communication: Other (comment) (RN tech aware low BP & pain level, premedicated)   Charletta Cousin, Loralye Loberg Beth Dixon 09/10/2011, 11:19 AM

## 2011-09-10 NOTE — Progress Notes (Signed)
CSW spoke with pt this am to assist with D/C Planning. Pt stated that if there was a chance she could go to [facility] for rehab that would be her first choice. She would accept a SNF bed at [facility] if [facility] was unable to offer her rehab. CSW will continue to follow until D/C plan finalized. Spoke with Lavonna Rua at [facility] to provide a D/C plan update.  Cori Razor  LCSW  [phone]

## 2011-09-10 NOTE — Op Note (Signed)
[name], [name] NO.:  [id number]  MEDICAL RECORD NO.:  [id number]  LOCATION:  1603                         FACILITY:  [facility]  PHYSICIAN:  Jene Every, M.D.    DATE OF BIRTH:  Mar 21, 1961  DATE OF PROCEDURE:  09/09/2011 DATE OF DISCHARGE:                              OPERATIVE REPORT   SURGEON:  Jene Every, M.D.  ASSISTANT:  Roma Schanz, P.A.  PREOPERATIVE DIAGNOSES: 1. 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 2. Morbid obesity.  POSTOPERATIVE DIAGNOSES: 1. 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 2. Morbid obesity.  PROCEDURE PERFORMED:  Left total knee arthroplasty utilizing DePuy rotating platform, 4 femur, 3 revision tray tibia, 10 mm insert, 38 patella.  HISTORY:  This is a 51 year old who has end-stage osteoarthrosis of the left knee, refractory to conservative treatment, bone-on-bone arthrosis with valgus deformity.  Recently underwent a gastric bypass procedure and has lost a significant amount of weight; however, still had weight of 125 kilos.  She had severe knee pain and was indicated for replacement of the degenerated joint.  Risk and benefits discussed including bleeding, infection, damage to neurovascular structures, CSF leak, damage to nearby structures, suboptimal range of motion, need for revision, DVT, PE, anesthetic complications, etc.  TECHNIQUE:  With the patient in supine position, after induction of adequate anesthesia, 1.5 g of vancomycin, 400 of clindamycin the left lower extremity was prepped, draped and exsanguinated in usual sterile fashion.  Thigh tourniquet inflated to 300 mmHg.  She had full extension, a mild to moderate valgus deformity.  Knee slightly flexed. Midline incision was then made.  Subcutaneous tissue was dissected. Electrocautery was utilized to achieve hemostasis.  She had fair amount of scar tissue actually inferiorly beneath the patella from  previous injury.  We identified the quadriceps tendon and patellar ligament through that plane of scar tissue.  I then performed a median patellar arthrotomy, everted the patella.  She had a significant hypertrophic synovium throughout the joint.  This was excised.  Multiple tricompartmental osteophytes were then removed  with a rongeur.  The knee was then flexed.  There was tricompartmental osteoarthrosis and the remnant of the ACL and medial and lateral menisci were removed. Osteophytes were removed from the patella as well.  Step drill was utilized to enter the femoral canal.  We decided on the 3 degree 10 off the distal femur from her preoperative exam and x-rays.  It was pinned. Oscillating saw utilized to perform the distal femoral cut.  Soft tissue protected posteriorly at all times.  We then sized the distal femur, the anterior cortex to a 4.  This was pinned in 3 degrees of external rotation.  The anterior, posterior and chamfer cuts were then performed with __________ behind the posterior condyles.  Next attention turned towards the tibia.  Tibia was subluxed with McHale's and we used our external alignment guide bisecting the ankle, 2 degree slope, 2 off the low side, which was posterolaterally, due to the defect. This was then pinned.  An oscillating saw performed that cut with soft tissues protected posteriorly at all times.  We tried our flexion extension gap with the 10 mm  and they were equivalent.  Checked posteriorly and removed remnants of medial and lateral menisci, preserved the popliteus. Removed the osteophytes posteriorly from the posterior femoral condyle as seen on the x-rays.  Next, turned our attention back towards the tibia.  McHale's placed trying to maximizing the tibia.  After a rongeur was utilized to remove the osteophytes, we tried a 4-and then a 3.  The 4 had overhang medially, a 3 was the better fit.  We were trying to maximize that surface due to the  patient's weight, therefore felt a 3 would be better in terms of fit, however due to her weight, we felt a revision tray was appropriate.  We then pinned that for the revision tray, maximizing the surface, slightly externally rotating for tracking. She had excellently rotated tibial tubercle.  We placed that and then drilled our central drill hole, irrigated it.  We then placed a trial tibial insert.  Attention was then turned back towards completing the femur, we bisecting the canal, with the box cut jig performed our box cut.  Soft tissue protected posteriorly at all times. Placed our trial femur, proximal tibia, and 10 mm insert.  We had full extension, full flexion, good stability with varus valgus stressing 0-30 degrees. Slight lateral tracking of the patella was noted.  We felt these were the appropriate sizes.  Patella was everted and measured.  We used a planer guide for the patella.  The patella interestingly had a bony prominence anterior to the anterior sclerotic line that when initially we measured the thickness of the patella, it gave [name] an artificially thin cut for the proximal pole of the patella.  We removed this osteophyte off the anterior aspect of the patella and then compensated for the thickness, pseudo-thickness, inferiorly by adding 3 mm to our cut, we wanted 15 mm of thickness, so we added to a 19 actually and we then clamped it, performed our cut and we had a symmetrical cut following this modification.  We then added 38 which added the appropriate amount to our pre-cut thickness which was a 26.  Drill holes were applied and medialized the patella.  Patella was fairly small, the actual portion of the patella and on the medial side it sloped downward. We curetted the cartilage off of that, placed the patella. In flexion and extension, we checked the patella. We performed a slight lateral release preserving the superior lateral vessels to optimize tracking. All  these provisional sizes were appropriate, all the trials were removed.  Used pulsatile lavage in the joint, cleaning all surfaces.  We cauterized the geniculate vessels.  Knee was then flexed, patella everted, tibia subluxed with McHale's and the surfaces were thoroughly dried.  The patient had slight increase in osseous bleeding therefore we increased the tourniquet to 325 once we initiated the cementing process. After all surfaces were cleaned and dried,  we mixed cement on the back table in the appropriate fashion, injected in the proximal tibia, digitally pressurizing that in the proximal tibia and on its surface and then impacted the tray in the appropriate version.  Tried and cemented the femur, impacted that in the appropriate version. Excellent fit was obtained, 10 mm insert was placed.  We reduced the knee and held it in full extension with axial load applied throughout the curing of the cement, redundant cement removed.  We then cemented the patellar component as well.  Redundant cement removed.  After full curing of the cement, we obtained full flexion, full  extension, good stability to varus valgus stressing 0-30 degrees and appropriate patellofemoral tracking.  We removed the 10 mm insert.  Checked the joint and meticulously removed any redundant cement.  Again due to her obesity, this was a somewhat challenging task.  Permanent insert was then placed. All interposed soft tissue was deflected. The knee was then reduced and again ranged, full extension, full flexion, good stability to varus and valgus stressing 0-30 degrees, negative anterior drawer, and appropriate patellofemoral tracking.  We placed a Hemovac and brought it out through a lateral stab wound in the skin.  As soon as the cement was fixed, we let the tourniquet down at 2 hours.  The patellar arthrotomy was then repaired with 1 Vicryl interrupted figure-of-eight sutures, 4 tracking sutures and we used a running  V-Loc, subcu with 2-0 Vicryl simple sutures.  Skin was reapproximated with subcuticular suture. Wound reinforced with Steri-Strips.  Sterile dressing applied.  Had flexion to gravity of 90 degrees, placed a knee immobilizer.  Extubated without difficulty, and transported to the recovery room in satisfactory condition.  The patient tolerated the procedure well.  No complications.  TOURNIQUET TIME:  2 hours.  BLOOD LOSS:  150 mL.     Jene Every, M.D.     Cordelia Pen  D:  09/09/2011  T:  09/10/2011  Job:  [id number]

## 2011-09-10 NOTE — Progress Notes (Signed)
Subjective: 1 Day Post-Op Procedure(s) (LRB): TOTAL KNEE ARTHROPLASTY (Left) Patient reports pain as moderate.  Denies CP or SOB  Patient has complaints of knee pain as expected.  She does not feel that the medications are helping.  We will start therapy today. Plan is to go rehab after hospital stay.  Objective: Vital signs in last 24 hours: Temp:  [97 F (36.1 C)-99.1 F (37.3 C)] 98.3 F (36.8 C) (05/23 0605) Pulse Rate:  [78-108] 105  (05/23 0605) Resp:  [8-18] 17  (05/23 0605) BP: (99-157)/(60-119) 112/78 mmHg (05/23 0605) SpO2:  [99 %-100 %] 100 % (05/23 0605) Weight:  [124.739 kg (275 lb)] 124.739 kg (275 lb) (05/22 1511)  Intake/Output from previous day:  Intake/Output Summary (Last 24 hours) at 09/10/11 1023 Last data filed at 09/10/11 0605  Gross per 24 hour  Intake 1933.33 ml  Output   1675 ml  Net 258.33 ml    Intake/Output this shift:    Labs: Results for orders placed during the hospital encounter of 09/09/11  PROTIME-INR      Component Value Range   Prothrombin Time 13.4  11.6 - 15.2 (seconds)   INR 1.00  0.00 - 1.49   HCG, SERUM, QUALITATIVE      Component Value Range   Preg, Serum NEGATIVE  NEGATIVE   TYPE AND SCREEN      Component Value Range   ABO/RH(D) O POS     Antibody Screen NEG     Sample Expiration 09/12/2011    ABO/RH      Component Value Range   ABO/RH(D) O POS    HEMOGLOBIN AND HEMATOCRIT, BLOOD      Component Value Range   Hemoglobin 11.0 (*) 12.0 - 15.0 (g/dL)   HCT 16.1 (*) 09.6 - 46.0 (%)  CBC      Component Value Range   WBC 7.8  4.0 - 10.5 (K/uL)   RBC 3.19 (*) 3.87 - 5.11 (MIL/uL)   Hemoglobin 8.7 (*) 12.0 - 15.0 (g/dL)   HCT 04.5 (*) 40.9 - 46.0 (%)   MCV 80.6  78.0 - 100.0 (fL)   MCH 27.3  26.0 - 34.0 (pg)   MCHC 33.9  30.0 - 36.0 (g/dL)   RDW 81.1  91.4 - 78.2 (%)   Platelets 120 (*) 150 - 400 (K/uL)  BASIC METABOLIC PANEL      Component Value Range   Sodium 134 (*) 135 - 145 (mEq/L)   Potassium 4.1  3.5 - 5.1  (mEq/L)   Chloride 102  96 - 112 (mEq/L)   CO2 23  19 - 32 (mEq/L)   Glucose, Bld 161 (*) 70 - 99 (mg/dL)   BUN 13  6 - 23 (mg/dL)   Creatinine, Ser 9.56 (*) 0.50 - 1.10 (mg/dL)   Calcium 8.2 (*) 8.4 - 10.5 (mg/dL)   GFR calc non Af Amer 29 (*) >90 (mL/min)   GFR calc Af Amer 34 (*) >90 (mL/min)  PROTIME-INR      Component Value Range   Prothrombin Time 16.0 (*) 11.6 - 15.2 (seconds)   INR 1.25  0.00 - 1.49     Exam - Neurologically intact Neurovascular intact Sensation intact distally Dorsiflexion/Plantar flexion intact Incision: dressing C/D/I Compartment soft Motor function intact - moving foot and toes well on exam.  Hemovac pulled without difficulty.  Assessment/Plan: 1 Day Post-Op Procedure(s) (LRB): TOTAL KNEE ARTHROPLASTY (Left)  Advance diet Up with therapy Past Medical History  Diagnosis Date  . Hypertension   .  Arthritis   . Asthma   . Morbid obesity   . PE (pulmonary embolism)   . DVT (deep venous thrombosis)   . GERD (gastroesophageal reflux disease)   . Dysfunctional uterine bleeding   . Headache     MIGRAINES IN PAST  . Sleep apnea, obstructive     USES C-PAP  . Glaucoma     DVT Prophylaxis -  Coumadin Protocol Weight-Bearing as tolerated to left leg Keep foley until tomorrow. No vaccines. Will adjust pain medication Plan for dressing change tomorrow Will cont to monitor hemoglobin and Creat   Lue Sykora R. 09/10/2011, 10:23 AM

## 2011-09-10 NOTE — Evaluation (Signed)
Physical Therapy Evaluation Patient Details Name: [name] MRN: [id number] DOB: 04-25-1960 Today's Date: 09/10/2011 Time: 4098-1191 PT Time Calculation (min): 34 min  PT Assessment / Plan / Recommendation Clinical Impression  Pt with L TKR presents with decreased Bil LE ROM and decreased L LE strenght as well as significantly limited functional moblity.    PT Assessment  Patient needs continued PT services    Follow Up Recommendations  Skilled nursing facility    Barriers to Discharge        lEquipment Recommendations  Defer to next venue    Recommendations for Other Services OT consult   Frequency 7X/week    Precautions / Restrictions Precautions Precautions: Knee;Other (comment) (L KI w/ Amb until able to perform SLR) Required Braces or Orthoses: Knee Immobilizer - Left Knee Immobilizer - Left: Discontinue once straight leg raise with < 10 degree lag Restrictions Weight Bearing Restrictions: No Other Position/Activity Restrictions: L WBAT   Pertinent Vitals/Pain 5/10 Premedicated, cold pack provided and RN aware      Mobility  Bed Mobility Bed Mobility: Supine to Sit Supine to Sit: 1: +2 Total assist Supine to Sit: Patient Percentage: 50% Details for Bed Mobility Assistance: cues for sequence and for use of UEs and R LE to self-assist Transfers Transfers: Sit to Stand;Stand to Sit Sit to Stand: 1: +2 Total assist;With upper extremity assist;From chair/3-in-1;From bed;With armrests Sit to Stand: Patient Percentage:  (Pt 60-70 % w/ increased time & VC's safety) Stand to Sit: 1: +2 Total assist;To chair/3-in-1;With armrests;With upper extremity assist Stand to Sit: Patient Percentage: 60% Details for Transfer Assistance: Pt is overall +2 total assist w/ VC's for safety & sequencing; pt 60-70% Ambulation/Gait Ambulation/Gait Assistance: 1: +2 Total assist Ambulation/Gait: Patient Percentage: 70% Ambulation Distance (Feet): 27 Feet Assistive device: Rolling  walker Ambulation/Gait Assistance Details: cues for sequence, posture, position from RW and increased UE WB Gait Pattern: Step-to pattern    Exercises Total Joint Exercises Ankle Circles/Pumps: AROM;15 reps;Supine;Both Quad Sets: AROM;10 reps;Supine;Both Heel Slides: AAROM;Supine;15 reps;Left Straight Leg Raises: AAROM;10 reps;Supine;Left   PT Diagnosis: Difficulty walking  PT Problem List: Decreased strength;Decreased range of motion;Decreased activity tolerance;Decreased mobility;Decreased knowledge of use of DME;Pain;Obesity PT Treatment Interventions: DME instruction;Gait training;Stair training;Functional mobility training;Therapeutic activities;Therapeutic exercise;Patient/family education   PT Goals Acute Rehab PT Goals PT Goal Formulation: With patient Time For Goal Achievement: 09/17/11 Potential to Achieve Goals: Good Pt will go Supine/Side to Sit: with min assist PT Goal: Supine/Side to Sit - Progress: Goal set today Pt will go Sit to Supine/Side: with min assist PT Goal: Sit to Supine/Side - Progress: Goal set today Pt will go Sit to Stand: with min assist PT Goal: Sit to Stand - Progress: Goal set today Pt will go Stand to Sit: with min assist PT Goal: Stand to Sit - Progress: Goal set today Pt will Ambulate: 51 - 150 feet;with min assist;with rolling walker PT Goal: Ambulate - Progress: Goal set today  Visit Information  Last PT Received On: 09/10/11 Assistance Needed: +2    Subjective Data  Subjective: My left knee is not much good either Patient Stated Goal: Resume previous lifestyle with decreased pain   Prior Functioning  Home Living Lives With: Alone Available Help at Discharge: Other (Comment) (Friend PRN cleaning) Type of Home: Apartment Home Access: Level entry Home Layout: One level Bathroom Shower/Tub: Engineer, manufacturing systems: Handicapped height (pt reports raised toilet seat) Bathroom Accessibility: Yes How Accessible: Accessible via  walker Home Adaptive Equipment: Bedside commode/3-in-1;Reacher;Walker -  rolling;Other (comment) (pt states has 3:1 that she uses in tub) Additional Comments: Pt reports that she uses 3:1 in tub Prior Function Level of Independence: Independent with assistive device(s) Driving: Yes Vocation: Other (comment) (Works from Home [employer]) Communication Communication: No difficulties Dominant Hand: Right    Cognition  Overall Cognitive Status: Appears within functional limits for tasks assessed/performed Arousal/Alertness: Lethargic Orientation Level: Appears intact for tasks assessed Behavior During Session: [facility] for tasks performed Cognition - Other Comments: Lethergy ? related to meds; Pt BP also low 86/60 noted    Extremity/Trunk Assessment Right Upper Extremity Assessment RUE ROM/Strength/Tone: Within functional levels RUE Sensation: WFL - Light Touch Left Upper Extremity Assessment LUE ROM/Strength/Tone: Within functional levels LUE Sensation: WFL - Light Touch Right Lower Extremity Assessment RLE ROM/Strength/Tone: Deficits RLE ROM/Strength/Tone Deficits: knee flex ltd to 60 by pain; hip flex to 90 Left Lower Extremity Assessment LLE ROM/Strength/Tone: Deficits LLE ROM/Strength/Tone Deficits: -10 - 35 AAROM at knee with 2/5 quads   Balance    End of Session PT - End of Session Equipment Utilized During Treatment: Left knee immobilizer Activity Tolerance: Patient tolerated treatment well Patient left: in chair;with call bell/phone within reach Nurse Communication: Mobility status   [name] 09/10/2011, 12:01 PM

## 2011-09-10 NOTE — Plan of Care (Signed)
Problem: Phase I Progression Outcomes Goal: Dangle evening of surgery Outcome: Not Met (add Reason) Patient too drowsy throughout night

## 2011-09-10 NOTE — Progress Notes (Signed)
ANTICOAGULATION CONSULT NOTE - Initial Consult  Pharmacy Consult for Coumadin  Indication: VTE prophylaxis  Allergies  Allergen Reactions  . Beta Adrenergic Blockers Other (See Comments)    RED EYES AND CONGESTION  . Penicillins Hives, Itching and Swelling  . Sulfa Antibiotics Hives    RED EYES  . Thimerosal Hives and Itching   Patient Measurements: Height: 5\' 4"  (162.6 cm) Weight: 275 lb (124.739 kg) IBW/kg (Calculated) : 54.7   Vital Signs: Temp: 98.3 F (36.8 C) (05/23 0605) Temp src: Oral (05/23 0605) BP: 86/60 mmHg (05/23 1035) Pulse Rate: 105  (05/23 0605)  Labs:  Basename 09/10/11 0405 09/09/11 1330 09/09/11 0617  HGB 8.7* 11.0* --  HCT 25.2* 32.7* --  PLT 120* -- --  APTT -- -- --  LABPROT 16.0* -- 13.4  INR 1.25 -- 1.00  HEPARINUNFRC -- -- --  CREATININE 1.89* -- --  CKTOTAL -- -- --  CKMB -- -- --  TROPONINI -- -- --   Estimated Creatinine Clearance: 45.5 ml/min (by C-G formula based on Cr of 1.89).  Medical History: Past Medical History  Diagnosis Date  . Hypertension   . Arthritis   . Asthma   . Morbid obesity   . PE (pulmonary embolism)   . DVT (deep venous thrombosis)   . GERD (gastroesophageal reflux disease)   . Dysfunctional uterine bleeding   . Headache     MIGRAINES IN PAST  . Sleep apnea, obstructive     USES C-PAP  . Glaucoma    Medications:  Prescriptions prior to admission  Medication Sig Dispense Refill  . b complex vitamins tablet Take 1 tablet by mouth daily.      . bimatoprost (LUMIGAN) 0.01 % SOLN Place 1 drop into the left eye at bedtime.      . brimonidine (ALPHAGAN) 0.2 % ophthalmic solution Place 1 drop into both eyes 3 (three) times daily.      . brinzolamide (AZOPT) 1 % ophthalmic suspension Place 1 drop into the left eye 3 (three) times daily.      . cyclobenzaprine (FLEXERIL) 10 MG tablet Take 10 mg by mouth at bedtime as needed. FOR MUSCLE SPASMS       . diltiazem (DILACOR XR) 240 MG 24 hr capsule Take 240 mg by  mouth at bedtime.      Marland Kitchen esomeprazole (NEXIUM) 40 MG capsule Take 40 mg by mouth daily before breakfast.      . fexofenadine (ALLEGRA) 180 MG tablet Take 180 mg by mouth daily with breakfast.      . gabapentin (NEURONTIN) 100 MG capsule Take 100 mg by mouth 3 (three) times daily.      . magnesium chloride (SLOW-MAG) 64 MG TBEC Take 1-2 tablets by mouth 2 (two) times daily. Patient takes 1 tablet every morning and 2 tablets by mouth every evening      . methocarbamol (ROBAXIN) 500 MG tablet Take 500 mg by mouth 3 (three) times daily as needed. For pain       . potassium chloride SA (K-DUR,KLOR-CON) 20 MEQ tablet Take 20 mEq by mouth 2 (two) times daily. Two in am and 1 at dinner      . PRESCRIPTION MEDICATION Take 1 puff by mouth 2 (two) times daily. Alvesco inhaler      . PRESCRIPTION MEDICATION Take by mouth at bedtime. Dilaresp=allergy medication      . Probiotic Product (PROBIOTIC FORMULA PO) Take 1 tablet by mouth daily.       Marland Kitchen  Propylene Glycol (SYSTANE BALANCE) 0.6 % SOLN Apply 1 drop to eye 3 (three) times daily. In right eye      . quiNINE (QUALAQUIN) 324 MG capsule Take 324 mg by mouth at bedtime as needed.       . traMADol (ULTRAM) 50 MG tablet Take 50 mg by mouth every 6 (six) hours as needed. For pain       . valsartan-hydrochlorothiazide (DIOVAN-HCT) 80-12.5 MG per tablet Take 1 tablet by mouth daily with breakfast.       . celecoxib (CELEBREX) 100 MG capsule Take 200 mg by mouth daily with breakfast.       . cyanocobalamin 1000 MCG tablet Take 2,500 mcg by mouth daily.      . diclofenac sodium (VOLTAREN) 1 % GEL Apply 1 application topically 4 (four) times daily as needed. For knee pain      . fish oil-omega-3 fatty acids 1000 MG capsule Take 2 g by mouth daily.      . Multiple Vitamin (MULTIVITAMIN) tablet Take 2 tablets by mouth at bedtime.       . niacin 500 MG tablet Take 500 mg by mouth daily with breakfast.      . warfarin (COUMADIN) 10 MG tablet Take 5-10 mg by mouth every  evening. 10mg  3days per week, 5mg  for 4 days per week       Assessment:  50yo F on chronic Coumadin for hx PE/DVT now Resuming Coumadin post L TKA.  Lovenox 30mg  SQ q12h ordered for bridging.  Home dose reported as 10 mg 3 days per week and 5 mg 4 days per week.   INR trending up slightly after 1 dose of 10 mg, repeat tonight.   CBC okay, no bleeding/complications reported  Goal of Therapy:  INR 2-3   Plan:   Coumadin 10mg  today using home regimen as a guide.  F/u daily INR  F/U d/c lovenox when INR >/= 1.8  Donta Mcinroy, Loma Messing PharmD 10:56 AM 09/10/2011

## 2011-09-11 DIAGNOSIS — R209 Unspecified disturbances of skin sensation: Secondary | ICD-10-CM

## 2011-09-11 DIAGNOSIS — I959 Hypotension, unspecified: Secondary | ICD-10-CM

## 2011-09-11 DIAGNOSIS — R112 Nausea with vomiting, unspecified: Secondary | ICD-10-CM

## 2011-09-11 DIAGNOSIS — Z96659 Presence of unspecified artificial knee joint: Secondary | ICD-10-CM

## 2011-09-11 LAB — TYPE AND SCREEN
ABO/RH(D): O POS
Antibody Screen: NEGATIVE
Unit division: 0

## 2011-09-11 LAB — BASIC METABOLIC PANEL
BUN: 10 mg/dL (ref 6–23)
CO2: 24 mEq/L (ref 19–32)
Calcium: 8.4 mg/dL (ref 8.4–10.5)
Chloride: 99 mEq/L (ref 96–112)
Creatinine, Ser: 1.27 mg/dL — ABNORMAL HIGH (ref 0.50–1.10)
GFR calc Af Amer: 55 mL/min — ABNORMAL LOW (ref >90)
GFR calc non Af Amer: 48 mL/min — ABNORMAL LOW (ref >90)
Glucose, Bld: 130 mg/dL — ABNORMAL HIGH (ref 70–99)
Potassium: 3.8 mEq/L (ref 3.5–5.1)
Sodium: 132 mEq/L — ABNORMAL LOW (ref 135–145)

## 2011-09-11 LAB — CBC
HCT: 26.6 % — ABNORMAL LOW (ref 36.0–46.0)
Hemoglobin: 9.2 g/dL — ABNORMAL LOW (ref 12.0–15.0)
MCH: 27.4 pg (ref 26.0–34.0)
MCHC: 34.6 g/dL (ref 30.0–36.0)
MCV: 79.2 fL (ref 78.0–100.0)
Platelets: 87 10*3/uL — ABNORMAL LOW (ref 150–400)
RBC: 3.36 MIL/uL — ABNORMAL LOW (ref 3.87–5.11)
RDW: 14.6 % (ref 11.5–15.5)
WBC: 9 10*3/uL (ref 4.0–10.5)

## 2011-09-11 LAB — PROTIME-INR
INR: 1.48 (ref 0.00–1.49)
Prothrombin Time: 18.2 seconds — ABNORMAL HIGH (ref 11.6–15.2)

## 2011-09-11 LAB — VITAMIN B12: Vitamin B-12: >2000 pg/mL — ABNORMAL HIGH (ref 211–911)

## 2011-09-11 MED ORDER — OXYCODONE HCL 5 MG PO TABS: 5.0000 mg | ORAL_TABLET | ORAL | Status: AC | PRN

## 2011-09-11 MED ORDER — WARFARIN SODIUM 10 MG PO TABS
10.0000 mg | ORAL_TABLET | Freq: Once | ORAL | Status: AC
  Administered 2011-09-11: 10 mg via ORAL
  Filled 2011-09-11: qty 1

## 2011-09-11 MED ORDER — ENOXAPARIN SODIUM 30 MG/0.3ML ~~LOC~~ SOLN: 30.0000 mg | Freq: Two times a day (BID) | SUBCUTANEOUS | Status: DC

## 2011-09-11 MED ORDER — LORATADINE 10 MG PO TABS
10.0000 mg | ORAL_TABLET | Freq: Once | ORAL | Status: AC
  Administered 2011-09-11: 10 mg via ORAL
  Filled 2011-09-11: qty 1

## 2011-09-11 NOTE — Progress Notes (Signed)
Subjective: 2 Days Post-Op Procedure(s) (LRB): TOTAL KNEE ARTHROPLASTY (Left) Patient reports pain as mild.   Denies CP or SOB. Voiding without difficulty. Patient is doing much better today following transfusion.  Objective: Vital signs in last 24 hours: Temp:  [98.1 F (36.7 C)-100.2 F (37.9 C)] 99.1 F (37.3 C) (05/24 0630) Pulse Rate:  [91-108] 92  (05/24 0630) Resp:  [14-18] 16  (05/24 0630) BP: (78-133)/(48-82) 124/78 mmHg (05/24 0630) SpO2:  [97 %-100 %] 100 % (05/24 0630)  Intake/Output from previous day:  Intake/Output Summary (Last 24 hours) at 09/11/11 0744 Last data filed at 09/11/11 0630  Gross per 24 hour  Intake 683.75 ml  Output   4025 ml  Net -3341.25 ml    Intake/Output this shift:    Labs: Results for orders placed during the hospital encounter of 09/09/11  PROTIME-INR      Component Value Range   Prothrombin Time 13.4  11.6 - 15.2 (seconds)   INR 1.00  0.00 - 1.49   HCG, SERUM, QUALITATIVE      Component Value Range   Preg, Serum NEGATIVE  NEGATIVE   TYPE AND SCREEN      Component Value Range   ABO/RH(D) O POS     Antibody Screen NEG     Sample Expiration 09/12/2011     Unit Number [id number]     Blood Component Type RED CELLS,LR     Unit division 00     Status of Unit ISSUED     Transfusion Status OK TO TRANSFUSE     Crossmatch Result Compatible    ABO/RH      Component Value Range   ABO/RH(D) O POS    HEMOGLOBIN AND HEMATOCRIT, BLOOD      Component Value Range   Hemoglobin 11.0 (*) 12.0 - 15.0 (g/dL)   HCT 40.9 (*) 81.1 - 46.0 (%)  CBC      Component Value Range   WBC 7.8  4.0 - 10.5 (K/uL)   RBC 3.19 (*) 3.87 - 5.11 (MIL/uL)   Hemoglobin 8.7 (*) 12.0 - 15.0 (g/dL)   HCT 91.4 (*) 78.2 - 46.0 (%)   MCV 80.6  78.0 - 100.0 (fL)   MCH 27.3  26.0 - 34.0 (pg)   MCHC 33.9  30.0 - 36.0 (g/dL)   RDW 95.6  21.3 - 08.6 (%)   Platelets 120 (*) 150 - 400 (K/uL)  BASIC METABOLIC PANEL      Component Value Range   Sodium 134 (*) 135 - 145  (mEq/L)   Potassium 4.1  3.5 - 5.1 (mEq/L)   Chloride 102  96 - 112 (mEq/L)   CO2 23  19 - 32 (mEq/L)   Glucose, Bld 161 (*) 70 - 99 (mg/dL)   BUN 13  6 - 23 (mg/dL)   Creatinine, Ser 5.78 (*) 0.50 - 1.10 (mg/dL)   Calcium 8.2 (*) 8.4 - 10.5 (mg/dL)   GFR calc non Af Amer 29 (*) >90 (mL/min)   GFR calc Af Amer 34 (*) >90 (mL/min)  PROTIME-INR      Component Value Range   Prothrombin Time 16.0 (*) 11.6 - 15.2 (seconds)   INR 1.25  0.00 - 1.49   PREPARE RBC (CROSSMATCH)      Component Value Range   Order Confirmation ORDER PROCESSED BY BLOOD BANK    CBC      Component Value Range   WBC 9.0  4.0 - 10.5 (K/uL)   RBC 3.36 (*) 3.87 - 5.11 (MIL/uL)  Hemoglobin 9.2 (*) 12.0 - 15.0 (g/dL)   HCT 16.1 (*) 09.6 - 46.0 (%)   MCV 79.2  78.0 - 100.0 (fL)   MCH 27.4  26.0 - 34.0 (pg)   MCHC 34.6  30.0 - 36.0 (g/dL)   RDW 04.5  40.9 - 81.1 (%)   Platelets 87 (*) 150 - 400 (K/uL)  PROTIME-INR      Component Value Range   Prothrombin Time 18.2 (*) 11.6 - 15.2 (seconds)   INR 1.48  0.00 - 1.49   BASIC METABOLIC PANEL      Component Value Range   Sodium 132 (*) 135 - 145 (mEq/L)   Potassium 3.8  3.5 - 5.1 (mEq/L)   Chloride 99  96 - 112 (mEq/L)   CO2 24  19 - 32 (mEq/L)   Glucose, Bld 130 (*) 70 - 99 (mg/dL)   BUN 10  6 - 23 (mg/dL)   Creatinine, Ser 9.14 (*) 0.50 - 1.10 (mg/dL)   Calcium 8.4  8.4 - 78.2 (mg/dL)   GFR calc non Af Amer 48 (*) >90 (mL/min)   GFR calc Af Amer 55 (*) >90 (mL/min)    Exam - Neurologically intact Neurovascular intact Sensation intact distally Incision: no drainage Compartment soft Dressing/Incision - clean, dry Motor function intact - moving foot and toes well on exam.   Assessment/Plan: 2 Days Post-Op Procedure(s) (LRB): TOTAL KNEE ARTHROPLASTY (Left)  Up with therapy Past Medical History  Diagnosis Date  . Hypertension   . Arthritis   . Asthma   . Morbid obesity   . PE (pulmonary embolism)   . DVT (deep venous thrombosis)   . GERD  (gastroesophageal reflux disease)   . Dysfunctional uterine bleeding   . Headache     MIGRAINES IN PAST  . Sleep apnea, obstructive     USES C-PAP  . Glaucoma     DVT Prophylaxis -  Coumadin Protocol bridging with Lovenox Weight-Bearing as tolerated to left leg Daily dressing change Patient will likely be ready for d/c to SNF/rehab tomorrow if bed is aval  Appreciate medical consult H/H and Creat much improved  Ramiel Forti R. 09/11/2011, 7:44 AM

## 2011-09-11 NOTE — Progress Notes (Signed)
Per Rehab MD consult for CIR, patient would be appropriate for CIR and is well known to [name] having been at CIR within the last year. I have faxed all clinicals to [facility] for their review and waiting to hear back from them. Patient verified that her sister will be coming into town on June 3rd to assist her at home and will be able to provide 24 hr supervision. Marina Goodell adm coordinator [phone].

## 2011-09-11 NOTE — Progress Notes (Signed)
Physical Therapy Treatment Patient Details Name: [name] MRN: [id number] DOB: 06-06-1960 Today's Date: 09/11/2011 Time: 1308-6578 PT Time Calculation (min): 38 min  PT Assessment / Plan / Recommendation Comments on Treatment Session       Follow Up Recommendations  Skilled nursing facility    Barriers to Discharge        Equipment Recommendations  Defer to next venue    Recommendations for Other Services OT consult  Frequency 7X/week   Plan      Precautions / Restrictions Precautions Precautions: Knee;Other (comment) Required Braces or Orthoses: Knee Immobilizer - Left Knee Immobilizer - Left: Discontinue once straight leg raise with < 10 degree lag Restrictions Weight Bearing Restrictions: No Other Position/Activity Restrictions: L WBAT   Pertinent Vitals/Pain 5/10, premedicated, cold pack supplied    Mobility  Transfers Transfers: Sit to Stand;Stand to Sit Sit to Stand: 3: Mod assist;From chair/3-in-1;With upper extremity assist;With armrests Stand to Sit: 3: Mod assist Stand to Sit: Patient Percentage: 60% Details for Transfer Assistance: Pt is overall +2 total assist w/ VC's for safety & sequencing; pt 60-70% Ambulation/Gait Ambulation/Gait Assistance: 1: +2 Total assist Ambulation/Gait: Patient Percentage: 70% Ambulation Distance (Feet): 45 Feet Assistive device: Rolling walker Ambulation/Gait Assistance Details: cues for stride length, posture, position from RW and increased UE WB Gait Pattern: Step-to pattern    Exercises Total Joint Exercises Ankle Circles/Pumps: AROM;15 reps;Supine;Both Quad Sets: AROM;20 reps;Both;Supine Heel Slides: AAROM;20 reps;Left;Supine Straight Leg Raises: AAROM;Left;20 reps;Supine   PT Diagnosis:    PT Problem List:   PT Treatment Interventions:     PT Goals Acute Rehab PT Goals PT Goal Formulation: With patient Time For Goal Achievement: 09/17/11 Potential to Achieve Goals: Good Pt will go Sit to Stand: with min  assist PT Goal: Sit to Stand - Progress: Progressing toward goal Pt will go Stand to Sit: with min assist PT Goal: Stand to Sit - Progress: Progressing toward goal Pt will Ambulate: 51 - 150 feet;with min assist;with rolling walker PT Goal: Ambulate - Progress: Progressing toward goal  Visit Information  Last PT Received On: 09/11/11 Assistance Needed: +2    Subjective Data  Subjective: My leg feels so heavy Patient Stated Goal: Resume previous lifestyle with decreased pain   Cognition  Overall Cognitive Status: Appears within functional limits for tasks assessed/performed Arousal/Alertness: Awake/alert Orientation Level: Appears intact for tasks assessed Behavior During Session: [facility] for tasks performed    Balance     End of Session PT - End of Session Equipment Utilized During Treatment: Left knee immobilizer Activity Tolerance: Patient tolerated treatment well Patient left: in chair;with call bell/phone within reach Nurse Communication: Mobility status CPM Left Knee CPM Left Knee: Off    Syriana Croslin 09/11/2011, 12:38 PM

## 2011-09-11 NOTE — Progress Notes (Signed)
Contacted [employer] regarding CIR case pending and the case remains pending as of 4:20pm today. Will not know anything from [employer] until Tuesday a.m due to holiday. Will f/u on Monday. Marina Goodell adm coordinator [phone]

## 2011-09-11 NOTE — Progress Notes (Signed)
ANTICOAGULATION CONSULT NOTE - Follow-Up  Pharmacy Consult for Coumadin  Indication: VTE prophylaxis  Allergies  Allergen Reactions  . Beta Adrenergic Blockers Other (See Comments)    RED EYES AND CONGESTION  . Penicillins Hives, Itching and Swelling  . Sulfa Antibiotics Hives    RED EYES  . Thimerosal Hives and Itching   Patient Measurements: Height: 5\' 4"  (162.6 cm) Weight: 275 lb (124.739 kg) IBW/kg (Calculated) : 54.7   Vital Signs: Temp: 99.1 F (37.3 C) (05/24 0630) Temp src: Oral (05/24 0630) BP: 124/78 mmHg (05/24 0630) Pulse Rate: 92  (05/24 0630)  Labs:  Basename 09/11/11 0415 09/10/11 0405 09/09/11 1330 09/09/11 0617  HGB 9.2* 8.7* -- --  HCT 26.6* 25.2* 32.7* --  PLT 87* 120* -- --  APTT -- -- -- --  LABPROT 18.2* 16.0* -- 13.4  INR 1.48 1.25 -- 1.00  HEPARINUNFRC -- -- -- --  CREATININE 1.27* 1.89* -- --  CKTOTAL -- -- -- --  CKMB -- -- -- --  TROPONINI -- -- -- --   Estimated Creatinine Clearance: 67.7 ml/min (by C-G formula based on Cr of 1.27).  Medical History: Past Medical History  Diagnosis Date  . Hypertension   . Arthritis   . Asthma   . Morbid obesity   . PE (pulmonary embolism)   . DVT (deep venous thrombosis)   . GERD (gastroesophageal reflux disease)   . Dysfunctional uterine bleeding   . Headache     MIGRAINES IN PAST  . Sleep apnea, obstructive     USES C-PAP  . Glaucoma    Medications:  Prescriptions prior to admission  Medication Sig Dispense Refill  . b complex vitamins tablet Take 1 tablet by mouth daily.      . bimatoprost (LUMIGAN) 0.01 % SOLN Place 1 drop into the left eye at bedtime.      . brimonidine (ALPHAGAN) 0.2 % ophthalmic solution Place 1 drop into both eyes 3 (three) times daily.      . brinzolamide (AZOPT) 1 % ophthalmic suspension Place 1 drop into the left eye 3 (three) times daily.      . cyclobenzaprine (FLEXERIL) 10 MG tablet Take 10 mg by mouth at bedtime as needed. FOR MUSCLE SPASMS       .  diltiazem (DILACOR XR) 240 MG 24 hr capsule Take 240 mg by mouth at bedtime.      Marland Kitchen esomeprazole (NEXIUM) 40 MG capsule Take 40 mg by mouth daily before breakfast.      . fexofenadine (ALLEGRA) 180 MG tablet Take 180 mg by mouth daily with breakfast.      . gabapentin (NEURONTIN) 100 MG capsule Take 100 mg by mouth 3 (three) times daily.      . magnesium chloride (SLOW-MAG) 64 MG TBEC Take 1-2 tablets by mouth 2 (two) times daily. Patient takes 1 tablet every morning and 2 tablets by mouth every evening      . methocarbamol (ROBAXIN) 500 MG tablet Take 500 mg by mouth 3 (three) times daily as needed. For pain       . potassium chloride SA (K-DUR,KLOR-CON) 20 MEQ tablet Take 20 mEq by mouth 2 (two) times daily. Two in am and 1 at dinner      . PRESCRIPTION MEDICATION Take 1 puff by mouth 2 (two) times daily. Alvesco inhaler      . PRESCRIPTION MEDICATION Take by mouth at bedtime. Dilaresp=allergy medication      . Probiotic Product (PROBIOTIC FORMULA PO) Take  1 tablet by mouth daily.       Marland Kitchen Propylene Glycol (SYSTANE BALANCE) 0.6 % SOLN Apply 1 drop to eye 3 (three) times daily. In right eye      . quiNINE (QUALAQUIN) 324 MG capsule Take 324 mg by mouth at bedtime as needed.       . valsartan-hydrochlorothiazide (DIOVAN-HCT) 80-12.5 MG per tablet Take 1 tablet by mouth daily with breakfast.       . DISCONTD: traMADol (ULTRAM) 50 MG tablet Take 50 mg by mouth every 6 (six) hours as needed. For pain       . cyanocobalamin 1000 MCG tablet Take 2,500 mcg by mouth daily.      . fish oil-omega-3 fatty acids 1000 MG capsule Take 2 g by mouth daily.      . Multiple Vitamin (MULTIVITAMIN) tablet Take 2 tablets by mouth at bedtime.       . niacin 500 MG tablet Take 500 mg by mouth daily with breakfast.      . warfarin (COUMADIN) 10 MG tablet Take 5-10 mg by mouth every evening. 10mg  3days per week, 5mg  for 4 days per week      . DISCONTD: celecoxib (CELEBREX) 100 MG capsule Take 200 mg by mouth daily with  breakfast.       . DISCONTD: diclofenac sodium (VOLTAREN) 1 % GEL Apply 1 application topically 4 (four) times daily as needed. For knee pain       Assessment:  [name] on chronic Coumadin for hx PE/DVT now resuming Coumadin post L TKA.  Lovenox 30mg  SQ q12h ordered for bridging until INR response adequate.  Home warfarin dosage reported as 10 mg 3 days per week and 5 mg 4 days per week.   INR trending up after warfarin 10mg  daily x2.  Order for discharge to SNF today noted.  Goal of Therapy:  INR 2-3   Plan:   Agree with resuming home warfarin dosage as ordered (10mg  on M,W,F; 7.5mg  on Tues, Thurs, Sat, Sun).  Recommend rechecking INR no later than 5/27, then at least twice weekly until stable, then at least weekly while at [facility].  Recommend discontinuing Lovenox when INR>=2.  Hope Budds PharmD, BCPS 9:39 AM 09/11/2011

## 2011-09-11 NOTE — Care Management Note (Unsigned)
    Page 1 of 2   09/11/2011     6:12:10 PM   CARE MANAGEMENT NOTE 09/11/2011  Patient:  [name], [name]   Account Number:  [id number]  Date Initiated:  09/11/2011  Documentation initiated by:  Colleen Can  Subjective/Objective Assessment:   dx total knee replacemnt     Action/Plan:   CIR vs SNF   Anticipated DC Date:  09/12/2011   Anticipated DC Plan:  IP REHAB FACILITY  In-house referral  Clinical Social Worker      DC Planning Services  CM consult      PAC Choice  IP REHAB   Choice offered to / List presented to:  C-1 Patient   DME arranged  NA      DME agency  NA     HH arranged  NA      HH agency  NA   Status of service:  In process, will continue to follow Medicare Important Message given?   (If response is "NO", the following Medicare IM given date fields will be blank) Date Medicare IM given:   Date Additional Medicare IM given:    Discharge Disposition:    Per UR Regulation:  Reviewed for med. necessity/level of care/duration of stay  If discussed at Long Length of Stay Meetings, dates discussed:    Comments:  09/11/2011 CM was advised by [facility] Inpatient rehab co-ordinator that case is pending with BC/BS Clinical social worker is following case for snf rehab as 2nd choice.

## 2011-09-11 NOTE — Progress Notes (Signed)
Subjective: Feels better, doing well overall, numbness in hands resolved, occasional peri-oral numbness in am  Objective: Vital signs in last 24 hours: Temp:  [98.3 F (36.8 C)-100.2 F (37.9 C)] 99.6 F (37.6 C) (05/24 1300) Pulse Rate:  [92-114] 114  (05/24 1300) Resp:  [14-18] 16  (05/24 1300) BP: (114-133)/(69-82) 124/81 mmHg (05/24 1300) SpO2:  [97 %-100 %] 100 % (05/24 1300) Weight change:  Last BM Date: 09/08/11  Intake/Output from previous day: 05/23 0701 - 05/24 0700 In: 683.8 [P.O.:240; I.V.:100; Blood:343.8] Out: 4025 [Urine:4025] Total I/O In: 240 [P.O.:240] Out: 1400 [Urine:1400]   Physical Exam: General: Alert, awake, oriented x3, in no acute distress. HEENT: No bruits, no goiter. Heart: Regular rate and rhythm, without murmurs, rubs, gallops. Lungs: Clear to auscultation bilaterally. Abdomen: Soft, nontender, nondistended, positive bowel sounds. Extremities: No clubbing cyanosis or edema with positive pedal pulses. R knee with dressing Neuro: Grossly intact, nonfocal.    Lab Results: Basic Metabolic Panel:  Basename 09/11/11 0415 09/10/11 0405  NA 132* 134*  K 3.8 4.1  CL 99 102  CO2 24 23  GLUCOSE 130* 161*  BUN 10 13  CREATININE 1.27* 1.89*  CALCIUM 8.4 8.2*  MG -- --  PHOS -- --   Liver Function Tests: No results found for this basename: AST:2,ALT:2,ALKPHOS:2,BILITOT:2,PROT:2,ALBUMIN:2 in the last 72 hours No results found for this basename: LIPASE:2,AMYLASE:2 in the last 72 hours No results found for this basename: AMMONIA:2 in the last 72 hours CBC:  Basename 09/11/11 0415 09/10/11 0405  WBC 9.0 7.8  NEUTROABS -- --  HGB 9.2* 8.7*  HCT 26.6* 25.2*  MCV 79.2 80.6  PLT 87* 120*   Cardiac Enzymes: No results found for this basename: CKTOTAL:3,CKMB:3,CKMBINDEX:3,TROPONINI:3 in the last 72 hours BNP: No results found for this basename: PROBNP:3 in the last 72 hours D-Dimer: No results found for this basename: DDIMER:2 in the last  72 hours CBG: No results found for this basename: GLUCAP:6 in the last 72 hours Hemoglobin A1C: No results found for this basename: HGBA1C in the last 72 hours Fasting Lipid Panel: No results found for this basename: CHOL,HDL,LDLCALC,TRIG,CHOLHDL,LDLDIRECT in the last 72 hours Thyroid Function Tests: No results found for this basename: TSH,T4TOTAL,FREET4,T3FREE,THYROIDAB in the last 72 hours Anemia Panel:  Basename 09/11/11 0415  VITAMINB12 >2000*  FOLATE --  FERRITIN --  TIBC --  IRON --  RETICCTPCT --   Coagulation:  Basename 09/11/11 0415 09/10/11 0405  LABPROT 18.2* 16.0*  INR 1.48 1.25   Urine Drug Screen: Drugs of Abuse  No results found for this basename: labopia, cocainscrnur, labbenz, amphetmu, thcu, labbarb    Alcohol Level: No results found for this basename: ETH:2 in the last 72 hours Urinalysis: No results found for this basename: COLORURINE:2,APPERANCEUR:2,LABSPEC:2,PHURINE:2,GLUCOSEU:2,HGBUR:2,BILIRUBINUR:2,KETONESUR:2,PROTEINUR:2,UROBILINOGEN:2,NITRITE:2,LEUKOCYTESUR:2 in the last 72 hours  Recent Results (from the past 240 hour(s))  SURGICAL PCR SCREEN     Status: Normal   Collection Time   09/03/11 10:13 AM      Component Value Range Status Comment   MRSA, PCR NEGATIVE  NEGATIVE  Final    Staphylococcus aureus NEGATIVE  NEGATIVE  Final     Studies/Results: Dg Chest Port 1 View  09/10/2011  *RADIOLOGY REPORT*  Clinical Data: Shortness of breath with exertion.  Recent knee surgery.  PORTABLE CHEST - 1 VIEW  Comparison: 01/26/2011.  Findings: 1444 hours.  Positioning is lordotic.  Heart size and mediastinal contours are stable.  The lungs are clear and there is no pleural effusion or pneumothorax.  The venous  catheters have been removed in the interval.  IMPRESSION: No active cardiopulmonary process.  Original Report Authenticated By: Gerrianne Scale, M.D.    Medications: Scheduled Meds:   . bimatoprost  1 drop Left Eye QHS  . brimonidine  1 drop  Both Eyes TID  . brinzolamide  1 drop Left Eye TID  . cyanocobalamin  2,500 mcg Oral Daily  . diltiazem  240 mg Oral QHS  . docusate sodium  100 mg Oral BID  . enoxaparin  30 mg Subcutaneous Q12H  . gabapentin  100 mg Oral TID  . loratadine  10 mg Oral Daily  . magnesium chloride  2 tablet Oral QHS  . magnesium chloride  1 tablet Oral Daily  . niacin  500 mg Oral Q breakfast  . pantoprazole  80 mg Oral Q1200  . polyvinyl alcohol  1 drop Both Eyes TID  . potassium chloride SA  20 mEq Oral BID  . warfarin  10 mg Oral ONCE-1800  . Warfarin - Pharmacist Dosing Inpatient   Does not apply q1800   Continuous Infusions:   . DISCONTD: 0.45 % NaCl with KCl 20 mEq / L 75 mL/hr at 09/10/11 2157   PRN Meds:.acetaminophen, acetaminophen, bisacodyl, diphenhydrAMINE, HYDROmorphone (DILAUDID) injection, menthol-cetylpyridinium, methocarbamol (ROBAXIN) IV, methocarbamol, metoCLOPramide (REGLAN) injection, metoCLOPramide, ondansetron (ZOFRAN) IV, ondansetron, oxyCODONE, phenol, polyethylene glycol, quiNINE  Assessment/Plan:  1. S/p L TKA per primary  2. Post op anemia: improved, s/p 1unit PRBC yesterday BP better, looks better overall 3. Paresthesias: resolved, peri-oral numbness could be from Hospital CPAP mask, B12 normal  Continue to Monitor, if worsens will pursue MRI C spine, given prior h/o cervical myelopathy no need for further w/u now 4. Mild hypotension: resolved Hold HCTZ/ARB today, could restart HCTZ if BP starts trending up tomorrow 5. OSA: on CPAP  6. H/o DVT/PE, IVC filter, restarted on coumadin with lovenox bridge  7. ARF: could be from hypotension, improved w/ blood and holding ARB/HCTZ DC planning     LOS: 2 days   [facility] Pager: [phone] 09/11/2011, 4:02 PM

## 2011-09-11 NOTE — Discharge Summary (Addendum)
Patient ID: [name] [place] MRN: [id number] DOB/AGE: 06/18/1960 51 y.o.  Admit date: 09/09/2011 Discharge date: 09/11/2011  Admission Diagnoses:  Active Problems:  Anemia  ARF (acute renal failure)  Paresthesias  Total knee replacement status, left  Past Medical History  Diagnosis Date  . Hypertension   . Arthritis   . Asthma   . Morbid obesity   . PE (pulmonary embolism)   . DVT (deep venous thrombosis)   . GERD (gastroesophageal reflux disease)   . Dysfunctional uterine bleeding   . Headache     MIGRAINES IN PAST  . Sleep apnea, obstructive     USES C-PAP  . Glaucoma    Discharge Diagnoses:  Same Resolved ARF Stable post operative anemia  Surgeries: Procedure(s): TOTAL KNEE ARTHROPLASTY on 09/09/2011   Consultants: Treatment Team:  Zannie Cove, MD  Discharged Condition: Improved  Hospital Course: [name] is an 51 y.o. female who was admitted 09/09/2011 for operative treatment of DJD left knee. Patient has severe unremitting pain that affects sleep, daily activities, and work/hobbies. After pre-op clearance the patient was taken to the operating room on 09/09/2011 and underwent  Procedure(s): TOTAL KNEE ARTHROPLASTY.    Patient was given perioperative antibiotics: Anti-infectives     Start     Dose/Rate Route Frequency Ordered Stop   09/09/11 2000   vancomycin (VANCOCIN) IVPB 1000 mg/200 mL premix        1,000 mg 200 mL/hr over 60 Minutes Intravenous Every 12 hours 09/09/11 1443 09/09/11 2043   09/09/11 1600   ciprofloxacin (CIPRO) IVPB 400 mg        400 mg 200 mL/hr over 60 Minutes Intravenous Every 12 hours 09/09/11 1443 09/10/11 0426   09/09/11 1530   quiNINE (QUALAQUIN) capsule 324 mg        324 mg Oral At bedtime PRN 09/09/11 1443     09/09/11 0829   polymyxin B 500,000 Units, bacitracin 50,000 Units in sodium chloride irrigation 0.9 % 500 mL irrigation  Status:  Discontinued          As needed 09/09/11 0830 09/09/11 1033   09/08/11 1559    vancomycin (VANCOCIN) 1,500 mg in sodium chloride 0.9 % 500 mL IVPB        1,500 mg 250 mL/hr over 120 Minutes Intravenous 60 min pre-op 09/08/11 1559 09/09/11 0700           Patient was given sequential compression devices, early ambulation, and chemoprophylaxis to prevent DVT.  She did have some vague complaints of UE numbness POD #1 which was associated with low BP and elevated Creat.  We decided due to her multiple co morbidities  to consult medicine.  They transfused one unit and the patient was much improved.  POD #2 she was doing much better with minimal complaints. Pain was better controlled.  Pending approval and continued improvement patient will be stable for d/c to facility POD #3.     Recent vital signs: Patient Vitals for the past 24 hrs:  BP Temp Temp src Pulse Resp SpO2  09/11/11 0630 124/78 mmHg 99.1 F (37.3 C) Oral 92  16  100 %  09/10/11 2154 114/75 mmHg 100.2 F (37.9 C) Oral 105  16  100 %  09/10/11 2139 - - - 108  17  100 %  09/10/11 2030 133/81 mmHg 99.1 F (37.3 C) Oral 98  16  97 %  09/10/11 1930 132/80 mmHg 99.5 F (37.5 C) Oral 103  18  100 %  01-Oct-2011 1830 119/79 mmHg 98.3 F (36.8 C) Oral 102  16  100 %  10/01/2011 1730 125/69 mmHg 99.9 F (37.7 C) Oral 103  16  -  Oct 01, 2011 1715 128/82 mmHg 99.4 F (37.4 C) Oral 99  14  -  10-01-2011 1655 116/74 mmHg 99.4 F (37.4 C) Oral 96  14  100 %  10/01/2011 1500 104/54 mmHg 98.1 F (36.7 C) Oral 91  16  100 %  10/01/2011 1035 86/60 mmHg - - - - -  10/01/11 1030 78/48 mmHg - - - - -     Recent laboratory studies:  Basename 09/11/11 0415 Oct 01, 2011 0405  WBC 9.0 7.8  HGB 9.2* 8.7*  HCT 26.6* 25.2*  PLT 87* 120*  NA 132* 134*  K 3.8 4.1  CL 99 102  CO2 24 23  BUN 10 13  CREATININE 1.27* 1.89*  GLUCOSE 130* 161*  INR 1.48 1.25  CALCIUM 8.4 --     Discharge Medications:   Medication List  As of 09/11/2011  7:54 AM   STOP taking these medications         celecoxib 100 MG capsule      diclofenac sodium  1 % Gel      traMADol 50 MG tablet         TAKE these medications         b complex vitamins tablet   Take 1 tablet by mouth daily.      bimatoprost 0.01 % Soln   Commonly known as: LUMIGAN   Place 1 drop into the left eye at bedtime.      brimonidine 0.2 % ophthalmic solution   Commonly known as: ALPHAGAN   Place 1 drop into both eyes 3 (three) times daily.      brinzolamide 1 % ophthalmic suspension   Commonly known as: AZOPT   Place 1 drop into the left eye 3 (three) times daily.      cyanocobalamin 1000 MCG tablet   Take 2,500 mcg by mouth daily.      cyclobenzaprine 10 MG tablet   Commonly known as: FLEXERIL   Take 10 mg by mouth at bedtime as needed. FOR MUSCLE SPASMS        diltiazem 240 MG 24 hr capsule   Commonly known as: DILACOR XR   Take 240 mg by mouth at bedtime.      enoxaparin 30 MG/0.3ML injection   Commonly known as: LOVENOX   Inject 0.3 mLs (30 mg total) into the skin every 12 (twelve) hours.      esomeprazole 40 MG capsule   Commonly known as: NEXIUM   Take 40 mg by mouth daily before breakfast.      fexofenadine 180 MG tablet   Commonly known as: ALLEGRA   Take 180 mg by mouth daily with breakfast.      fish oil-omega-3 fatty acids 1000 MG capsule   Take 2 g by mouth daily.      gabapentin 100 MG capsule   Commonly known as: NEURONTIN   Take 100 mg by mouth 3 (three) times daily.      magnesium chloride 64 MG Tbec   Commonly known as: SLOW-MAG   Take 1-2 tablets by mouth 2 (two) times daily. Patient takes 1 tablet every morning and 2 tablets by mouth every evening      methocarbamol 500 MG tablet   Commonly known as: ROBAXIN   Take 500 mg by mouth 3 (three) times daily as needed.  For pain        multivitamin tablet   Take 2 tablets by mouth at bedtime.      niacin 500 MG tablet   Take 500 mg by mouth daily with breakfast.      oxyCODONE 5 MG immediate release tablet   Commonly known as: Oxy IR/ROXICODONE   Take 1 tablet (5  mg total) by mouth every 3 (three) hours as needed.      potassium chloride SA 20 MEQ tablet   Commonly known as: K-DUR,KLOR-CON   Take 20 mEq by mouth 2 (two) times daily. Two in am and 1 at dinner      PRESCRIPTION MEDICATION   Take 1 puff by mouth 2 (two) times daily. [place] inhaler      PRESCRIPTION MEDICATION   Take by mouth at bedtime. Dilaresp=allergy medication      PROBIOTIC FORMULA PO   Take 1 tablet by mouth daily.      quiNINE 324 MG capsule   Commonly known as: QUALAQUIN   Take 324 mg by mouth at bedtime as needed.      SYSTANE BALANCE 0.6 % Soln   Generic drug: Propylene Glycol   Apply 1 drop to eye 3 (three) times daily. In right eye      valsartan-hydrochlorothiazide 80-12.5 MG per tablet   Commonly known as: DIOVAN-HCT   Take 1 tablet by mouth daily with breakfast.      warfarin 10 MG tablet   Commonly known as: COUMADIN   Take 5-10 mg by mouth every evening. 10mg  3days per week, 5mg  for 4 days per week            Diagnostic Studies: Dg Knee 1-2 Views Left  2011/09/05  *RADIOLOGY REPORT*  Clinical Data: Preop  LEFT KNEE - 1-2 VIEW  Comparison: 01/22/2011  Findings: Two views of the left knee submitted.  Severe tricompartment degenerative changes again noted.  Small joint effusion again noted.  Significant spurring of patella again noted. There is diffuse osteopenia.  No acute fracture or subluxation.  IMPRESSION: No acute fracture or subluxation.  Severe tricompartment degenerative changes again noted.  Original Report Authenticated By: Natasha Mead, M.D.   Dg Chest Port 1 View  09/10/2011  *RADIOLOGY REPORT*  Clinical Data: Shortness of breath with exertion.  Recent knee surgery.  PORTABLE CHEST - 1 VIEW  Comparison: 01/26/2011.  Findings: 1444 hours.  Positioning is lordotic.  Heart size and mediastinal contours are stable.  The lungs are clear and there is no pleural effusion or pneumothorax.  The venous catheters have been removed in the interval.   IMPRESSION: No active cardiopulmonary process.  Original Report Authenticated By: Gerrianne Scale, M.D.   X-ray Knee Left Port  09/09/2011  *RADIOLOGY REPORT*  Clinical Data: Postop left knee arthroplasty.  PORTABLE LEFT KNEE - 1-2 VIEW  Comparison: 09/05/11 radiographs.  Findings: The patient has undergone interval left total knee arthroplasty.  The hardware appears well positioned.  A surgical drain is in place.  A small amount of air is present within the joint and soft tissues surrounding the knee.  There is no evidence of acute fracture or subluxation.  IMPRESSION: No demonstrated complication related to left total knee arthroplasty.  Original Report Authenticated By: Gerrianne Scale, M.D.    Disposition: CIR vs SNF  Discharge Orders    Future Appointments: Provider: Department: Dept Phone: Center:   09/18/2011 10:40 AM Valarie Merino, MD [facility] [phone] None     Future  Orders Please Complete By Expires   Diet - low sodium heart healthy      Call MD / Call 911      Comments:   If you experience chest pain or shortness of breath, CALL 911 and be transported to the hospital emergency room.  If you develope a fever above 101 F, pus (white drainage) or increased drainage or redness at the wound, or calf pain, call your surgeon's office.   Constipation Prevention      Comments:   Drink plenty of fluids.  Prune juice may be helpful.  You may use a stool softener, such as Colace (over the counter) 100 mg twice a day.  Use MiraLax (over the counter) for constipation as needed.   Increase activity slowly as tolerated      Discharge instructions      Comments:   Use knee immobilizer until can SLR x 10 Elevate above heart level at least 6 x a day for 20 minutes Daily dressing change  Keep wound clean and dry OK to shower     TED hose      Comments:   Use stockings (TED hose) for 4 weeks on both leg(s).  You may remove them at night for sleeping.   Change dressing       Comments:   change the dressing daily with sterile 4 x 4 inch gauze dressing and apply TED hose.      Follow-up Information    Follow up with BEANE,JEFFREY C, MD in 14 days. (as scheduled)    Contact information:   [facility] [address], [address] [place] [address] [phone]         Patient stayed an additional few days secondary to post operative anemia and holiday weekend delays.  The patient did receive one unit of PRBC and was much improved.  It is felt she is stable for d/c to SNF vs CIR.  At time of discharge the patient is therapeutic on her Coumadin.   Signed: Kartik Fernando R. 09/11/2011, 7:54 AM

## 2011-09-12 DIAGNOSIS — R209 Unspecified disturbances of skin sensation: Secondary | ICD-10-CM

## 2011-09-12 DIAGNOSIS — I959 Hypotension, unspecified: Secondary | ICD-10-CM

## 2011-09-12 DIAGNOSIS — R112 Nausea with vomiting, unspecified: Secondary | ICD-10-CM

## 2011-09-12 DIAGNOSIS — Z96659 Presence of unspecified artificial knee joint: Secondary | ICD-10-CM

## 2011-09-12 LAB — CBC
HCT: 25.1 % — ABNORMAL LOW (ref 36.0–46.0)
Hemoglobin: 8.7 g/dL — ABNORMAL LOW (ref 12.0–15.0)
MCH: 27.4 pg (ref 26.0–34.0)
MCHC: 34.7 g/dL (ref 30.0–36.0)
MCV: 79.2 fL (ref 78.0–100.0)
Platelets: 97 10*3/uL — ABNORMAL LOW (ref 150–400)
RBC: 3.17 MIL/uL — ABNORMAL LOW (ref 3.87–5.11)
RDW: 14.5 % (ref 11.5–15.5)
WBC: 8.7 10*3/uL (ref 4.0–10.5)

## 2011-09-12 LAB — PROTIME-INR
INR: 1.53 — ABNORMAL HIGH (ref 0.00–1.49)
Prothrombin Time: 18.7 seconds — ABNORMAL HIGH (ref 11.6–15.2)

## 2011-09-12 MED ORDER — WARFARIN SODIUM 10 MG PO TABS
10.0000 mg | ORAL_TABLET | Freq: Once | ORAL | Status: AC
  Administered 2011-09-12: 10 mg via ORAL
  Filled 2011-09-12: qty 1

## 2011-09-12 MED ORDER — WARFARIN SODIUM 10 MG PO TABS
10.0000 mg | ORAL_TABLET | Freq: Once | ORAL | Status: DC
  Filled 2011-09-12: qty 1

## 2011-09-12 NOTE — Progress Notes (Signed)
Physical Therapy Treatment Patient Details Name: Keyonta [place]orp[place] MRN: [id number] DOB: [birth year] Today's Date: 09/12/2011 Time: 4098-1191 PT Time Calculation (min): 15 min  PT Assessment / Plan / Recommendation Comments on Treatment Session       Follow Up Recommendations  Skilled nursing facility    Barriers to Discharge        Equipment Recommendations  Defer to next venue    Recommendations for Other Services OT consult  Frequency 7X/week   Plan Discharge plan remains appropriate    Precautions / Restrictions Precautions Precautions: Knee;Other (comment) Required Braces or Orthoses: Knee Immobilizer - Left Knee Immobilizer - Left: Discontinue once straight leg raise with < 10 degree lag Restrictions Weight Bearing Restrictions: No Other Position/Activity Restrictions: L WBAT   Pertinent Vitals/Pain 4/10    Mobility  Bed Mobility Bed Mobility: Supine to Sit Supine to Sit: 3: Mod assist Details for Bed Mobility Assistance: cues for sequence and for use of UEs and R LE to self-assist Transfers Transfers: Sit to Stand;Stand to Sit Sit to Stand: 3: Mod assist;From chair/3-in-1;With upper extremity assist;With armrests Stand to Sit: 3: Mod assist Details for Transfer Assistance: cues for use of UEs and for LE management Ambulation/Gait Ambulation/Gait Assistance: 4: Min assist;3: Mod assist Ambulation Distance (Feet): 111 Feet Assistive device: Rolling walker Ambulation/Gait Assistance Details: min cues for posture and position from RW Gait Pattern: Step-to pattern    Exercises     PT Diagnosis:    PT Problem List:   PT Treatment Interventions:     PT Goals Acute Rehab PT Goals PT Goal Formulation: With patient Time For Goal Achievement: 09/17/11 Potential to Achieve Goals: Good Pt will go Supine/Side to Sit: with min assist PT Goal: Supine/Side to Sit - Progress: Progressing toward goal Pt will go Sit to Supine/Side: with min assist PT Goal: Sit to  Supine/Side - Progress: Progressing toward goal Pt will go Sit to Stand: with min assist PT Goal: Sit to Stand - Progress: Progressing toward goal Pt will go Stand to Sit: with min assist PT Goal: Stand to Sit - Progress: Progressing toward goal Pt will Ambulate: 51 - 150 feet;with min assist;with rolling walker PT Goal: Ambulate - Progress: Progressing toward goal  Visit Information  Last PT Received On: 09/12/11 Assistance Needed: +2    Subjective Data  Subjective: I need to lay down Patient Stated Goal: Resume previous lifestyle with decreased pain   Cognition  Overall Cognitive Status: Appears within functional limits for tasks assessed/performed Arousal/Alertness: Awake/alert Orientation Level: Appears intact for tasks assessed Behavior During Session: [facility] for tasks performed    Balance     End of Session PT - End of Session Equipment Utilized During Treatment: Left knee immobilizer Activity Tolerance: Patient tolerated treatment well Nurse Communication: Mobility status    Izabelle Daus 09/12/2011, 1:07 PM

## 2011-09-12 NOTE — Progress Notes (Signed)
ANTICOAGULATION CONSULT NOTE - Follow-Up  Pharmacy Consult for Coumadin  Indication: VTE prophylaxis  Allergies  Allergen Reactions  . Beta Adrenergic Blockers Other (See Comments)    RED EYES AND CONGESTION  . Penicillins Hives, Itching and Swelling  . Sulfa Antibiotics Hives    RED EYES  . Thimerosal Hives and Itching   Patient Measurements: Height: 5\' 4"  (162.6 cm) Weight: 275 lb (124.739 kg) IBW/kg (Calculated) : 54.7   Vital Signs: Temp: 99.6 F (37.6 C) (05/25 0449) Temp src: Oral (05/25 0449) BP: 123/80 mmHg (05/25 0449) Pulse Rate: 88  (05/25 0449)  Labs:  Basename 09/12/11 0417 09/11/11 0415 09/10/11 0405  HGB 8.7* 9.2* --  HCT 25.1* 26.6* 25.2*  PLT 97* 87* 120*  APTT -- -- --  LABPROT 18.7* 18.2* 16.0*  INR 1.53* 1.48 1.25  HEPARINUNFRC -- -- --  CREATININE -- 1.27* 1.89*  CKTOTAL -- -- --  CKMB -- -- --  TROPONINI -- -- --   Estimated Creatinine Clearance: 67.7 ml/min (by C-G formula based on Cr of 1.27).  Medical History: Past Medical History  Diagnosis Date  . Hypertension   . Arthritis   . Asthma   . Morbid obesity   . PE (pulmonary embolism)   . DVT (deep venous thrombosis)   . GERD (gastroesophageal reflux disease)   . Dysfunctional uterine bleeding   . Headache     MIGRAINES IN PAST  . Sleep apnea, obstructive     USES C-PAP  . Glaucoma    Medications:  Prescriptions prior to admission  Medication Sig Dispense Refill  . b complex vitamins tablet Take 1 tablet by mouth daily.      . bimatoprost (LUMIGAN) 0.01 % SOLN Place 1 drop into the left eye at bedtime.      . brimonidine (ALPHAGAN) 0.2 % ophthalmic solution Place 1 drop into both eyes 3 (three) times daily.      . brinzolamide (AZOPT) 1 % ophthalmic suspension Place 1 drop into the left eye 3 (three) times daily.      . cyclobenzaprine (FLEXERIL) 10 MG tablet Take 10 mg by mouth at bedtime as needed. FOR MUSCLE SPASMS       . diltiazem (DILACOR XR) 240 MG 24 hr capsule Take  240 mg by mouth at bedtime.      Marland Kitchen esomeprazole (NEXIUM) 40 MG capsule Take 40 mg by mouth daily before breakfast.      . fexofenadine (ALLEGRA) 180 MG tablet Take 180 mg by mouth daily with breakfast.      . gabapentin (NEURONTIN) 100 MG capsule Take 100 mg by mouth 3 (three) times daily.      . magnesium chloride (SLOW-MAG) 64 MG TBEC Take 1-2 tablets by mouth 2 (two) times daily. Patient takes 1 tablet every morning and 2 tablets by mouth every evening      . methocarbamol (ROBAXIN) 500 MG tablet Take 500 mg by mouth 3 (three) times daily as needed. For pain       . potassium chloride SA (K-DUR,KLOR-CON) 20 MEQ tablet Take 20 mEq by mouth 2 (two) times daily. Two in am and 1 at dinner      . PRESCRIPTION MEDICATION Take 1 puff by mouth 2 (two) times daily. [place] inhaler      . PRESCRIPTION MEDICATION Take by mouth at bedtime. Dilaresp=allergy medication      . Probiotic Product (PROBIOTIC FORMULA PO) Take 1 tablet by mouth daily.       Marland Kitchen Propylene  Glycol (SYSTANE BALANCE) 0.6 % SOLN Apply 1 drop to eye 3 (three) times daily. In right eye      . quiNINE (QUALAQUIN) 324 MG capsule Take 324 mg by mouth at bedtime as needed.       . valsartan-hydrochlorothiazide (DIOVAN-HCT) 80-12.5 MG per tablet Take 1 tablet by mouth daily with breakfast.       . DISCONTD: traMADol (ULTRAM) 50 MG tablet Take 50 mg by mouth every 6 (six) hours as needed. For pain       . cyanocobalamin 1000 MCG tablet Take 2,500 mcg by mouth daily.      . fish oil-omega-3 fatty acids 1000 MG capsule Take 2 g by mouth daily.      . Multiple Vitamin (MULTIVITAMIN) tablet Take 2 tablets by mouth at bedtime.       . niacin 500 MG tablet Take 500 mg by mouth daily with breakfast.      . warfarin (COUMADIN) 10 MG tablet Take 5-10 mg by mouth every evening. 10mg  3days per week, 5mg  for 4 days per week      . DISCONTD: celecoxib (CELEBREX) 100 MG capsule Take 200 mg by mouth daily with breakfast.       . DISCONTD: diclofenac sodium  (VOLTAREN) 1 % GEL Apply 1 application topically 4 (four) times daily as needed. For knee pain       Assessment:  [name] on chronic Coumadin for hx PE/DVT and resumed Coumadin post L TKA.  Lovenox 30mg  SQ q12h ordered for bridging until INR response adequate.  Home warfarin dosage reported as 10 mg 3 days per week and 5 mg 4 days per week.   INR trending up after warfarin 10mg  daily x3.  CBC low but stable, monitoring. No bleeding/complications reported.  Goal of Therapy:  INR 2-3   Plan:   Coumadin 10 mg once today.  Will schedule at 1200 so patient receives dose prior to discharge.  When discharged to SNF, agree with resuming home warfarin dosage as ordered (10mg  on M,W,F; 7.5mg  on Tues, Thurs, Sat, Sun).  Recommend rechecking INR no later than 5/27, then at least twice weekly until stable, then at least weekly while at [facility].  Recommend discontinuing Lovenox when INR>=2.  Clance Boll PharmD, BCPS 9:04 AM 09/12/2011

## 2011-09-12 NOTE — Progress Notes (Signed)
Subjective: 3 Days Post-Op Procedure(s) (LRB): TOTAL KNEE ARTHROPLASTY (Left) Patient reports pain as mild.  Per the notes, CIR is on hold until at least Tuesday since it's still in review with [employer].  Explained this to the patient. Pt c/o pain in her thigh.  Objective: Vital signs in last 24 hours: Temp:  [99.6 F (37.6 C)-100.5 F (38.1 C)] 99.6 F (37.6 C) (05/25 0449) Pulse Rate:  [88-114] 88  (05/25 0449) Resp:  [16] 16  (05/25 0449) BP: (123-131)/(75-81) 123/80 mmHg (05/25 0449) SpO2:  [97 %-100 %] 100 % (05/25 0449)  Intake/Output from previous day: 05/24 0701 - 05/25 0700 In: 480 [P.O.:480] Out: 3525 [Urine:3525] Intake/Output this shift:     Basename 09/12/11 0417 09/11/11 0415 09/10/11 0405 09/09/11 1330  HGB 8.7* 9.2* 8.7* 11.0*    Basename 09/12/11 0417 09/11/11 0415  WBC 8.7 9.0  RBC 3.17* 3.36*  HCT 25.1* 26.6*  PLT 97* 87*    Basename 09/11/11 0415 09/10/11 0405  NA 132* 134*  K 3.8 4.1  CL 99 102  CO2 24 23  BUN 10 13  CREATININE 1.27* 1.89*  GLUCOSE 130* 161*  CALCIUM 8.4 8.2*    Basename 09/12/11 0417 09/11/11 0415  LABPT -- --  INR 1.53* 1.48    Compartment soft wound dressed and dry  Assessment/Plan: 3 Days Post-Op Procedure(s) (LRB): TOTAL KNEE ARTHROPLASTY (Left) Up with therapy  Pt ok for discharge to SNF if bed available and approved today.  O/w stay until Tuesday.  Toni Arthurs 09/12/2011, 7:34 AM

## 2011-09-12 NOTE — Progress Notes (Signed)
Subjective: Doing well, some sinus congestion earlier, and low grade fever last pm  Objective: Vital signs in last 24 hours: Temp:  [99.6 F (37.6 C)-100.5 F (38.1 C)] 99.6 F (37.6 C) (05/25 0449) Pulse Rate:  [88-114] 88  (05/25 0449) Resp:  [16] 16  (05/25 0449) BP: (123-131)/(75-81) 123/80 mmHg (05/25 0449) SpO2:  [97 %-100 %] 100 % (05/25 0449) Weight change:  Last BM Date: 09/08/11  Intake/Output from previous day: 05/24 0701 - 05/25 0700 In: 480 [P.O.:480] Out: 3525 [Urine:3525] Total I/O In: 240 [P.O.:240] Out: 750 [Urine:750]   Physical Exam: General: Alert, awake, oriented x3, in no acute distress. HEENT: No bruits, no goiter. Heart: Regular rate and rhythm, without murmurs, rubs, gallops. Lungs: Clear to auscultation bilaterally. Abdomen: Soft, nontender, nondistended, positive bowel sounds. Extremities: No clubbing cyanosis or edema with positive pedal pulses. R knee with dressing Neuro: Grossly intact, nonfocal.    Lab Results: Basic Metabolic Panel:  Basename 09/11/11 0415 09/10/11 0405  NA 132* 134*  K 3.8 4.1  CL 99 102  CO2 24 23  GLUCOSE 130* 161*  BUN 10 13  CREATININE 1.27* 1.89*  CALCIUM 8.4 8.2*  MG -- --  PHOS -- --   Liver Function Tests: No results found for this basename: AST:2,ALT:2,ALKPHOS:2,BILITOT:2,PROT:2,ALBUMIN:2 in the last 72 hours No results found for this basename: LIPASE:2,AMYLASE:2 in the last 72 hours No results found for this basename: AMMONIA:2 in the last 72 hours CBC:  Basename 09/12/11 0417 09/11/11 0415  WBC 8.7 9.0  NEUTROABS -- --  HGB 8.7* 9.2*  HCT 25.1* 26.6*  MCV 79.2 79.2  PLT 97* 87*   Cardiac Enzymes: No results found for this basename: CKTOTAL:3,CKMB:3,CKMBINDEX:3,TROPONINI:3 in the last 72 hours BNP: No results found for this basename: PROBNP:3 in the last 72 hours D-Dimer: No results found for this basename: DDIMER:2 in the last 72 hours CBG: No results found for this basename: GLUCAP:6  in the last 72 hours Hemoglobin A1C: No results found for this basename: HGBA1C in the last 72 hours Fasting Lipid Panel: No results found for this basename: CHOL,HDL,LDLCALC,TRIG,CHOLHDL,LDLDIRECT in the last 72 hours Thyroid Function Tests: No results found for this basename: TSH,T4TOTAL,FREET4,T3FREE,THYROIDAB in the last 72 hours Anemia Panel:  Basename 09/11/11 0415  VITAMINB12 >2000*  FOLATE --  FERRITIN --  TIBC --  IRON --  RETICCTPCT --   Coagulation:  Basename 09/12/11 0417 09/11/11 0415  LABPROT 18.7* 18.2*  INR 1.53* 1.48   Urine Drug Screen: Drugs of Abuse  No results found for this basename: labopia,  cocainscrnur,  labbenz,  amphetmu,  thcu,  labbarb    Alcohol Level: No results found for this basename: ETH:2 in the last 72 hours Urinalysis: No results found for this basename: COLORURINE:2,APPERANCEUR:2,LABSPEC:2,PHURINE:2,GLUCOSEU:2,HGBUR:2,BILIRUBINUR:2,KETONESUR:2,PROTEINUR:2,UROBILINOGEN:2,NITRITE:2,LEUKOCYTESUR:2 in the last 72 hours  Recent Results (from the past 240 hour(s))  SURGICAL PCR SCREEN     Status: Normal   Collection Time   09/03/11 10:13 AM      Component Value Range Status Comment   MRSA, PCR NEGATIVE  NEGATIVE  Final    Staphylococcus aureus NEGATIVE  NEGATIVE  Final     Studies/Results: Dg Chest Port 1 View  09/10/2011  *RADIOLOGY REPORT*  Clinical Data: Shortness of breath with exertion.  Recent knee surgery.  PORTABLE CHEST - 1 VIEW  Comparison: 01/26/2011.  Findings: 1444 hours.  Positioning is lordotic.  Heart size and mediastinal contours are stable.  The lungs are clear and there is no pleural effusion or pneumothorax.  The  venous catheters have been removed in the interval.  IMPRESSION: No active cardiopulmonary process.  Original Report Authenticated By: Gerrianne Scale, M.D.    Medications: Scheduled Meds:    . bimatoprost  1 drop Left Eye QHS  . brimonidine  1 drop Both Eyes TID  . brinzolamide  1 drop Left Eye TID  .  cyanocobalamin  2,500 mcg Oral Daily  . diltiazem  240 mg Oral QHS  . docusate sodium  100 mg Oral BID  . enoxaparin  30 mg Subcutaneous Q12H  . gabapentin  100 mg Oral TID  . loratadine  10 mg Oral Daily  . loratadine  10 mg Oral Once  . magnesium chloride  2 tablet Oral QHS  . magnesium chloride  1 tablet Oral Daily  . niacin  500 mg Oral Q breakfast  . pantoprazole  80 mg Oral Q1200  . polyvinyl alcohol  1 drop Both Eyes TID  . potassium chloride SA  20 mEq Oral BID  . warfarin  10 mg Oral Once  . warfarin  10 mg Oral Once  . Warfarin - Pharmacist Dosing Inpatient   Does not apply q1800  . DISCONTD: warfarin  10 mg Oral ONCE-1800   Continuous Infusions:    . DISCONTD: 0.45 % NaCl with KCl 20 mEq / L 75 mL/hr at 09/10/11 2157   PRN Meds:.acetaminophen, acetaminophen, bisacodyl, diphenhydrAMINE, HYDROmorphone (DILAUDID) injection, menthol-cetylpyridinium, methocarbamol (ROBAXIN) IV, methocarbamol, metoCLOPramide (REGLAN) injection, metoCLOPramide, ondansetron (ZOFRAN) IV, ondansetron, oxyCODONE, phenol, polyethylene glycol, quiNINE  Assessment/Plan:  1. S/p L TKA per primary  2. Post op anemia: improved, s/p 1unit PRBC 5/23 BP better, looks better overall 3. Paresthesias: resolved, peri-oral numbness could be from Hospital CPAP mask, B12 normal  Continue to Monitor, if worsens will pursue MRI C spine, given prior h/o cervical myelopathy no need for further w/u now 4. Mild hypotension: resolved Hold HCTZ/ARB today, could restart HCTZ if BP starts trending up tomorrow 5. OSA: on CPAP  6. H/o DVT/PE, IVC filter, restarted on coumadin with lovenox bridge  7. ARF: could be from hypotension, improved w/ blood and holding ARB/HCTZ 8. Low grade fever: encouraged incentive spirometer, continue to monitor DC planning     LOS: 3 days   [facility] Pager: [phone] 09/12/2011, 12:14 PM

## 2011-09-12 NOTE — Progress Notes (Deleted)
Patient c/o allergy and sinus congestion. On- call PA Roma Schanz called to give 1x dose of Claritin tonight. Will continue to monitor patient.

## 2011-09-12 NOTE — Progress Notes (Signed)
Spoke with patient regarding cpap for rest.  Cpap at 11cm h2o per home regimen with ramp set and nasal mask from home.  Pt stated she was not ready to get on cpap, but will place it on herself when she is ready for bed.  Pt advised that RT available all night should she need assistance and encouraged her to let her nurse know.

## 2011-09-12 NOTE — Progress Notes (Signed)
Patient c/o allergy and sinus congestion. On call PA Roma Schanz paged to give 1x dose of Claritin tonight. Will continue to monitor patient.

## 2011-09-12 NOTE — Progress Notes (Signed)
Physical Therapy Treatment Patient Details Name: [name] MRN: [id number] DOB: 1960-10-11 Today's Date: 09/12/2011 Time: 7829-5621 PT Time Calculation (min): 38 min  PT Assessment / Plan / Recommendation Comments on Treatment Session  Steady progress    Follow Up Recommendations  Skilled nursing facility    Barriers to Discharge        Equipment Recommendations  Defer to next venue    Recommendations for Other Services OT consult  Frequency 7X/week   Plan Discharge plan remains appropriate    Precautions / Restrictions Precautions Precautions: Knee;Other (comment) Required Braces or Orthoses: Knee Immobilizer - Left Knee Immobilizer - Left: Discontinue once straight leg raise with < 10 degree lag Restrictions Weight Bearing Restrictions: No Other Position/Activity Restrictions: L WBAT   Pertinent Vitals/Pain     Mobility  Bed Mobility Bed Mobility: Supine to Sit Supine to Sit: 4: Min assist;3: Mod assist Details for Bed Mobility Assistance: cues for sequence and for use of UEs and R LE to self-assist Transfers Transfers: Sit to Stand;Stand to Sit Sit to Stand: 4: Min assist;3: Mod assist Stand to Sit: 4: Min assist;3: Mod assist Details for Transfer Assistance: cues for use of UEs and for LE management Ambulation/Gait Ambulation/Gait Assistance: 4: Min assist;3: Mod assist Ambulation Distance (Feet): 65 Feet (65' x 2) Assistive device: Rolling walker Ambulation/Gait Assistance Details: min cues for posture and stride length Gait Pattern: Step-to pattern    Exercises Total Joint Exercises Ankle Circles/Pumps: AROM;15 reps;Supine;Both Quad Sets: AROM;20 reps;Both;Supine Heel Slides: AAROM;20 reps;Left;Supine Straight Leg Raises: AAROM;Left;20 reps;Supine   PT Diagnosis:    PT Problem List:   PT Treatment Interventions:     PT Goals Acute Rehab PT Goals PT Goal Formulation: With patient Time For Goal Achievement: 09/17/11 Potential to Achieve Goals:  Good Pt will go Supine/Side to Sit: with min assist PT Goal: Supine/Side to Sit - Progress: Progressing toward goal Pt will go Sit to Supine/Side: with min assist PT Goal: Sit to Supine/Side - Progress: Progressing toward goal Pt will go Sit to Stand: with min assist PT Goal: Sit to Stand - Progress: Progressing toward goal Pt will go Stand to Sit: with min assist PT Goal: Stand to Sit - Progress: Progressing toward goal Pt will Ambulate: 51 - 150 feet;with min assist;with rolling walker PT Goal: Ambulate - Progress: Progressing toward goal  Visit Information  Last PT Received On: 09/12/11 Assistance Needed: +1    Subjective Data  Subjective: I'm ready Patient Stated Goal: Resume previous lifestyle with decreased pain   Cognition  Overall Cognitive Status: Appears within functional limits for tasks assessed/performed Arousal/Alertness: Awake/alert Orientation Level: Appears intact for tasks assessed Behavior During Session: [facility] for tasks performed    Balance     End of Session PT - End of Session Equipment Utilized During Treatment: Left knee immobilizer Activity Tolerance: Patient tolerated treatment well Patient left: in chair;with call bell/phone within reach Nurse Communication: Mobility status    [name] 09/12/2011, 4:06 PM

## 2011-09-13 ENCOUNTER — Inpatient Hospital Stay (HOSPITAL_COMMUNITY): Payer: BC Managed Care – PPO

## 2011-09-13 DIAGNOSIS — R209 Unspecified disturbances of skin sensation: Secondary | ICD-10-CM

## 2011-09-13 DIAGNOSIS — R112 Nausea with vomiting, unspecified: Secondary | ICD-10-CM

## 2011-09-13 DIAGNOSIS — Z96659 Presence of unspecified artificial knee joint: Secondary | ICD-10-CM

## 2011-09-13 DIAGNOSIS — I959 Hypotension, unspecified: Secondary | ICD-10-CM

## 2011-09-13 LAB — CBC
HCT: 24.9 % — ABNORMAL LOW (ref 36.0–46.0)
Hemoglobin: 8.4 g/dL — ABNORMAL LOW (ref 12.0–15.0)
MCH: 26.8 pg (ref 26.0–34.0)
MCHC: 33.7 g/dL (ref 30.0–36.0)
MCV: 79.6 fL (ref 78.0–100.0)
Platelets: 114 10*3/uL — ABNORMAL LOW (ref 150–400)
RBC: 3.13 MIL/uL — ABNORMAL LOW (ref 3.87–5.11)
RDW: 14.8 % (ref 11.5–15.5)
WBC: 7.1 10*3/uL (ref 4.0–10.5)

## 2011-09-13 LAB — BASIC METABOLIC PANEL
BUN: 8 mg/dL (ref 6–23)
CO2: 25 mEq/L (ref 19–32)
Calcium: 8.6 mg/dL (ref 8.4–10.5)
Chloride: 104 mEq/L (ref 96–112)
Creatinine, Ser: 0.94 mg/dL (ref 0.50–1.10)
GFR calc Af Amer: 79 mL/min — ABNORMAL LOW (ref >90)
GFR calc non Af Amer: 69 mL/min — ABNORMAL LOW (ref >90)
Glucose, Bld: 120 mg/dL — ABNORMAL HIGH (ref 70–99)
Potassium: 3.6 mEq/L (ref 3.5–5.1)
Sodium: 138 mEq/L (ref 135–145)

## 2011-09-13 LAB — PROTIME-INR
INR: 1.86 — ABNORMAL HIGH (ref 0.00–1.49)
Prothrombin Time: 21.8 seconds — ABNORMAL HIGH (ref 11.6–15.2)

## 2011-09-13 MED ORDER — GABAPENTIN 100 MG PO CAPS
100.0000 mg | ORAL_CAPSULE | Freq: Every day | ORAL | Status: DC
  Administered 2011-09-14: 100 mg via ORAL
  Filled 2011-09-13 (×2): qty 1

## 2011-09-13 MED ORDER — WARFARIN SODIUM 7.5 MG PO TABS
7.5000 mg | ORAL_TABLET | Freq: Once | ORAL | Status: AC
  Administered 2011-09-13: 7.5 mg via ORAL
  Filled 2011-09-13 (×2): qty 1

## 2011-09-13 NOTE — Progress Notes (Signed)
Subjective: Patient states that she is doing well but she still has some numbness  Around her mouth.   Objective: Vital signs in last 24 hours: Temp:  [98.2 F (36.8 C)-99.7 F (37.6 C)] 99.7 F (37.6 C) (05/26 1353) Pulse Rate:  [73-110] 73  (05/26 1353) Resp:  [16] 16  (05/26 1353) BP: (95-123)/(64-85) 123/84 mmHg (05/26 1353) SpO2:  [97 %-100 %] 97 % (05/26 1353) Weight change:  Last BM Date: 09/13/11  Intake/Output from previous day: 05/25 0701 - 05/26 0700 In: 960 [P.O.:960] Out: 2325 [Urine:2325] Total I/O In: 720 [P.O.:720] Out: -    Physical Exam: General: Alert, awake, and in no acute distress. HEENT: No bruits, no goiter. Heart: Regular rate and rhythm, without murmurs, rubs, gallops. Lungs: Clear to auscultation bilaterally. Abdomen: Soft, nontender, nondistended, positive bowel sounds. Extremities: No clubbing cyanosis or edema with positive pedal pulses. R knee with dressing Neuro: Grossly intact, nonfocal.    Lab Results: Basic Metabolic Panel:  Basename 09/13/11 1030 09/11/11 0415  NA 138 132*  K 3.6 3.8  CL 104 99  CO2 25 24  GLUCOSE 120* 130*  BUN 8 10  CREATININE 0.94 1.27*  CALCIUM 8.6 8.4  MG -- --  PHOS -- --    Basename 09/13/11 1030 09/12/11 0417  WBC 7.1 8.7  NEUTROABS -- --  HGB 8.4* 8.7*  HCT 24.9* 25.1*  MCV 79.6 79.2  PLT 114* 97*   Anemia Panel:  Basename 09/11/11 0415  VITAMINB12 >2000*  FOLATE --  FERRITIN --  TIBC --  IRON --  RETICCTPCT --   Coagulation:  Basename 09/13/11 0413 09/12/11 0417  LABPROT 21.8* 18.7*  INR 1.86* 1.53*   Medications: Scheduled Meds:  . bimatoprost  1 drop Left Eye QHS  . brimonidine  1 drop Both Eyes TID  . brinzolamide  1 drop Left Eye TID  . cyanocobalamin  2,500 mcg Oral Daily  . diltiazem  240 mg Oral QHS  . docusate sodium  100 mg Oral BID  . enoxaparin  30 mg Subcutaneous Q12H  . gabapentin  100 mg Oral QHS  . loratadine  10 mg Oral Daily  . magnesium chloride  2  tablet Oral QHS  . magnesium chloride  1 tablet Oral Daily  . niacin  500 mg Oral Q breakfast  . pantoprazole  80 mg Oral Q1200  . polyvinyl alcohol  1 drop Both Eyes TID  . potassium chloride SA  20 mEq Oral BID  . warfarin  10 mg Oral Once  . warfarin  7.5 mg Oral ONCE-1800  . Warfarin - Pharmacist Dosing Inpatient   Does not apply q1800  . DISCONTD: gabapentin  100 mg Oral TID   Continuous Infusions:   PRN Meds:.acetaminophen, acetaminophen, bisacodyl, diphenhydrAMINE, HYDROmorphone (DILAUDID) injection, menthol-cetylpyridinium, methocarbamol (ROBAXIN) IV, methocarbamol, metoCLOPramide (REGLAN) injection, metoCLOPramide, ondansetron (ZOFRAN) IV, ondansetron, oxyCODONE, phenol, polyethylene glycol, quiNINE  Assessment/Plan:  1. S/p L TKA -per ortho  2. Post op anemia: improved, s/p 1unit PRBC 5/23 3.HTN/ Mild hypotension-BP controlled. 3.Paresthesias:, Peri-oral numbness still present.  Unsure what is causing this.  She has no neck pain no other neuro symptoms. will get a C-spine MRI of her neck.  given prior h/o cervical myelopathy. 4.DM: ?diet controlled 5. OSA: on CPAP  6. H/o DVT/PE, IVC filter, Continue on coumadin with lovenox bridge  7. ARF: could be from hypotension, improved w/ blood and holding ARB/HCTZ.  monitor 8. Low grade fever: Resolved, continue to encouraged incentive spirometer, continue to monitor  9.DC planning- per ortho     LOS: 4 days   Susie Pousson, [place] Pager: [phone] 09/13/2011, 2:00 PM

## 2011-09-13 NOTE — Progress Notes (Signed)
Physical Therapy Treatment Patient Details Name: [name] MRN: [id number] DOB: Apr 10, 1961 Today's Date: 09/13/2011 Time: 4098-1191 PT Time Calculation (min): 24 min  PT Assessment / Plan / Recommendation Comments on Treatment Session  Steady progress    Follow Up Recommendations  Skilled nursing facility    Barriers to Discharge        Equipment Recommendations  Defer to next venue    Recommendations for Other Services OT consult  Frequency 7X/week   Plan Discharge plan remains appropriate    Precautions / Restrictions Precautions Precautions: Knee;Other (comment) Required Braces or Orthoses: Knee Immobilizer - Left Knee Immobilizer - Left: Discontinue once straight leg raise with < 10 degree lag Restrictions Weight Bearing Restrictions: No Other Position/Activity Restrictions: L WBAT   Pertinent Vitals/Pain     Mobility  Transfers Transfers: Sit to Stand;Stand to Sit Sit to Stand: 4: Min assist Stand to Sit: 4: Min assist Details for Transfer Assistance: cues for use of UEs and for LE management Ambulation/Gait Ambulation/Gait Assistance: 4: Min assist Ambulation Distance (Feet): 74 Feet (x2) Assistive device: Rolling walker Ambulation/Gait Assistance Details: min cues for posture and position from RW Gait Pattern: Step-to pattern    Exercises     PT Diagnosis:    PT Problem List:   PT Treatment Interventions:     PT Goals Acute Rehab PT Goals PT Goal Formulation: With patient Time For Goal Achievement: 09/17/11 Potential to Achieve Goals: Good Pt will go Supine/Side to Sit: with min assist Pt will go Sit to Stand: with supervision PT Goal: Sit to Stand - Progress: Updated due to goal met Pt will go Stand to Sit: with supervision PT Goal: Stand to Sit - Progress: Updated due to goals met Pt will Ambulate: 51 - 150 feet;with min assist;with rolling walker PT Goal: Ambulate - Progress: Progressing toward goal  Visit Information  Last PT Received  On: 09/13/11 Assistance Needed: +1    Subjective Data  Subjective: It hurts a little more than yesterday Patient Stated Goal: Resume previous lifestyle with decreased pain   Cognition  Overall Cognitive Status: Appears within functional limits for tasks assessed/performed Arousal/Alertness: Awake/alert Orientation Level: Appears intact for tasks assessed Behavior During Session: [facility] for tasks performed    Balance     End of Session PT - End of Session Equipment Utilized During Treatment: Left knee immobilizer Activity Tolerance: Patient tolerated treatment well Patient left: in chair;with call bell/phone within reach Nurse Communication: Mobility status CPM Left Knee CPM Left Knee: Off    Ragna Kramlich 09/13/2011, 11:32 AM

## 2011-09-13 NOTE — Progress Notes (Signed)
Subjective: 4 Days Post-Op Procedure(s) (LRB): TOTAL KNEE ARTHROPLASTY (Left) Patient reports pain as moderate.  No Chest pain , SOB. Tolerating diet well. BM this AM also reports no problems with urination.    Objective: Vital signs in last 24 hours: Temp:  [98.2 F (36.8 C)-99.4 F (37.4 C)] 98.2 F (36.8 C) (05/26 0525) Pulse Rate:  [104-110] 104  (05/26 0525) Resp:  [16] 16  (05/26 0525) BP: (95-122)/(64-85) 122/85 mmHg (05/26 0525) SpO2:  [98 %-100 %] 100 % (05/26 0525)  Intake/Output from previous day: 05/25 0701 - 05/26 0700 In: 960 [P.O.:960] Out: 2325 [Urine:2325] Intake/Output this shift:     Basename 09/12/11 0417 09/11/11 0415  HGB 8.7* 9.2*    Basename 09/12/11 0417 09/11/11 0415  WBC 8.7 9.0  RBC 3.17* 3.36*  HCT 25.1* 26.6*  PLT 97* 87*    Basename 09/11/11 0415  NA 132*  K 3.8  CL 99  CO2 24  BUN 10  CREATININE 1.27*  GLUCOSE 130*  CALCIUM 8.4    Basename 09/13/11 0413 09/12/11 0417  LABPT -- --  INR 1.86* 1.53*    Neurovascular intact Sensation intact distally Intact pulses distally Dorsiflexion/Plantar flexion intact Incision: scant drainage Compartment soft  Assessment/Plan: 4 Days Post-Op Procedure(s) (LRB): TOTAL KNEE ARTHROPLASTY (Left) Up with therapy Will revisit labs to check Hgb and ARF Planning to go to CIR on Tuesday  Pakou Rainbow W. 09/13/2011, 9:19 AM

## 2011-09-13 NOTE — Progress Notes (Signed)
ANTICOAGULATION CONSULT NOTE - Follow-Up  Pharmacy Consult for Coumadin  Indication: VTE prophylaxis  Allergies  Allergen Reactions  . Beta Adrenergic Blockers Other (See Comments)    RED EYES AND CONGESTION  . Penicillins Hives, Itching and Swelling  . Sulfa Antibiotics Hives    RED EYES  . Thimerosal Hives and Itching   Patient Measurements: Height: 5\' 4"  (162.6 cm) Weight: 275 lb (124.739 kg) IBW/kg (Calculated) : 54.7   Vital Signs: Temp: 99.4 F (37.4 C) (05/25 2105) Temp src: Oral (05/25 2105) BP: 121/78 mmHg (05/26 0203) Pulse Rate: 110  (05/26 0203)  Labs:  Basename 09/13/11 0413 09/12/11 0417 09/11/11 0415  HGB -- 8.7* 9.2*  HCT -- 25.1* 26.6*  PLT -- 97* 87*  APTT -- -- --  LABPROT 21.8* 18.7* 18.2*  INR 1.86* 1.53* 1.48  HEPARINUNFRC -- -- --  CREATININE -- -- 1.27*  CKTOTAL -- -- --  CKMB -- -- --  TROPONINI -- -- --   Estimated Creatinine Clearance: 67.7 ml/min (by C-G formula based on Cr of 1.27).  Medical History: Past Medical History  Diagnosis Date  . Hypertension   . Arthritis   . Asthma   . Morbid obesity   . PE (pulmonary embolism)   . DVT (deep venous thrombosis)   . GERD (gastroesophageal reflux disease)   . Dysfunctional uterine bleeding   . Headache     MIGRAINES IN PAST  . Sleep apnea, obstructive     USES C-PAP  . Glaucoma    Medications:  Prescriptions prior to admission  Medication Sig Dispense Refill  . b complex vitamins tablet Take 1 tablet by mouth daily.      . bimatoprost (LUMIGAN) 0.01 % SOLN Place 1 drop into the left eye at bedtime.      . brimonidine (ALPHAGAN) 0.2 % ophthalmic solution Place 1 drop into both eyes 3 (three) times daily.      . brinzolamide (AZOPT) 1 % ophthalmic suspension Place 1 drop into the left eye 3 (three) times daily.      . cyclobenzaprine (FLEXERIL) 10 MG tablet Take 10 mg by mouth at bedtime as needed. FOR MUSCLE SPASMS       . diltiazem (DILACOR XR) 240 MG 24 hr capsule Take 240 mg  by mouth at bedtime.      Marland Kitchen esomeprazole (NEXIUM) 40 MG capsule Take 40 mg by mouth daily before breakfast.      . fexofenadine (ALLEGRA) 180 MG tablet Take 180 mg by mouth daily with breakfast.      . gabapentin (NEURONTIN) 100 MG capsule Take 100 mg by mouth 3 (three) times daily.      . magnesium chloride (SLOW-MAG) 64 MG TBEC Take 1-2 tablets by mouth 2 (two) times daily. Patient takes 1 tablet every morning and 2 tablets by mouth every evening      . methocarbamol (ROBAXIN) 500 MG tablet Take 500 mg by mouth 3 (three) times daily as needed. For pain       . potassium chloride SA (K-DUR,KLOR-CON) 20 MEQ tablet Take 20 mEq by mouth 2 (two) times daily. Two in am and 1 at dinner      . PRESCRIPTION MEDICATION Take 1 puff by mouth 2 (two) times daily. [place] inhaler      . PRESCRIPTION MEDICATION Take by mouth at bedtime. Dilaresp=allergy medication      . Probiotic Product (PROBIOTIC FORMULA PO) Take 1 tablet by mouth daily.       Marland Kitchen Propylene  Glycol (SYSTANE BALANCE) 0.6 % SOLN Apply 1 drop to eye 3 (three) times daily. In right eye      . quiNINE (QUALAQUIN) 324 MG capsule Take 324 mg by mouth at bedtime as needed.       . valsartan-hydrochlorothiazide (DIOVAN-HCT) 80-12.5 MG per tablet Take 1 tablet by mouth daily with breakfast.       . DISCONTD: traMADol (ULTRAM) 50 MG tablet Take 50 mg by mouth every 6 (six) hours as needed. For pain       . cyanocobalamin 1000 MCG tablet Take 2,500 mcg by mouth daily.      . fish oil-omega-3 fatty acids 1000 MG capsule Take 2 g by mouth daily.      . Multiple Vitamin (MULTIVITAMIN) tablet Take 2 tablets by mouth at bedtime.       . niacin 500 MG tablet Take 500 mg by mouth daily with breakfast.      . warfarin (COUMADIN) 10 MG tablet Take 5-10 mg by mouth every evening. 10mg  3days per week, 5mg  for 4 days per week      . DISCONTD: celecoxib (CELEBREX) 100 MG capsule Take 200 mg by mouth daily with breakfast.       . DISCONTD: diclofenac sodium  (VOLTAREN) 1 % GEL Apply 1 application topically 4 (four) times daily as needed. For knee pain       Assessment:  [name] on chronic Coumadin for hx PE/DVT and resumed Coumadin post L TKA.  Lovenox 30mg  SQ q12h ordered for bridging until INR response adequate.  Home warfarin dosage reported as 10 mg 3 days per week and 5 mg 4 days per week.   INR trending up with warfarin 10mg  daily x4.  Anticipate INR will reach goal range tomorrow.  CBC low but stable, monitoring. No bleeding/complications reported.  Goal of Therapy:  INR 2-3   Plan:   Coumadin 7.5 mg today.   When discharged to SNF, agree with resuming home warfarin dosage.  Recommend rechecking INR at least twice weekly until stable, then at least weekly while at [facility].  Recommend discontinuing Lovenox when INR>=2.  Clance Boll PharmD, BCPS 6:30 AM 09/13/2011

## 2011-09-13 NOTE — Progress Notes (Signed)
Physical Therapy Treatment Patient Details Name: [name] Costabile MRN: [id number] DOB: 1960-06-26 Today's Date: 09/13/2011 Time: 1253-1310 PT Time Calculation (min): 17 min  PT Assessment / Plan / Recommendation Comments on Treatment Session  Pt had just gotten back into bed from using restroom and deferred second ambulation, however did agree to perform exercises in bed.     Follow Up Recommendations  Skilled nursing facility    Barriers to Discharge        Equipment Recommendations  Defer to next venue    Recommendations for Other Services OT consult  Frequency 7X/week   Plan Discharge plan remains appropriate    Precautions / Restrictions Precautions Precautions: Knee;Other (comment) Required Braces or Orthoses: Knee Immobilizer - Left Knee Immobilizer - Left: Discontinue once straight leg raise with < 10 degree lag Restrictions Weight Bearing Restrictions: No Other Position/Activity Restrictions: L WBAT   Pertinent Vitals/Pain 3/10    Mobility  Transfers Transfers: Sit to Stand;Stand to Sit Sit to Stand: 4: Min assist Stand to Sit: 4: Min assist Details for Transfer Assistance: cues for use of UEs and for LE management Ambulation/Gait Ambulation/Gait Assistance: 4: Min assist Ambulation Distance (Feet): 74 Feet (x2) Assistive device: Rolling walker Ambulation/Gait Assistance Details: min cues for posture and position from RW Gait Pattern: Step-to pattern    Exercises Total Joint Exercises Ankle Circles/Pumps: AROM;Both;20 reps Quad Sets: AROM;Left;10 reps Short Arc QuadBarbaraann Boys;Left;10 reps Heel Slides: AAROM;Left;10 reps Hip ABduction/ADduction: AAROM;Left;10 reps Straight Leg Raises: AAROM;Left;10 reps   PT Diagnosis:    PT Problem List:   PT Treatment Interventions:     PT Goals Acute Rehab PT Goals PT Goal Formulation: With patient Time For Goal Achievement: 09/17/11 Potential to Achieve Goals: Good Pt will go Supine/Side to Sit: with min assist Pt  will go Sit to Stand: with supervision PT Goal: Sit to Stand - Progress: Updated due to goal met Pt will go Stand to Sit: with supervision PT Goal: Stand to Sit - Progress: Updated due to goals met Pt will Ambulate: 51 - 150 feet;with min assist;with rolling walker PT Goal: Ambulate - Progress: Progressing toward goal  Visit Information  Last PT Received On: 09/13/11 Assistance Needed: +1    Subjective Data  Subjective: My knee pain level is at a minimum.  Patient Stated Goal: Resume previous lifestyle with decreased pain   Cognition  Overall Cognitive Status: Appears within functional limits for tasks assessed/performed Arousal/Alertness: Awake/alert Orientation Level: Appears intact for tasks assessed Behavior During Session: [facility] for tasks performed    Balance     End of Session PT - End of Session Equipment Utilized During Treatment: Left knee immobilizer Activity Tolerance: Patient tolerated treatment well Patient left: in bed;with call bell/phone within reach Nurse Communication: Mobility status    Page, Meribeth Mattes 09/13/2011, 1:18 PM

## 2011-09-14 DIAGNOSIS — I959 Hypotension, unspecified: Secondary | ICD-10-CM

## 2011-09-14 DIAGNOSIS — Z96659 Presence of unspecified artificial knee joint: Secondary | ICD-10-CM

## 2011-09-14 DIAGNOSIS — R209 Unspecified disturbances of skin sensation: Secondary | ICD-10-CM

## 2011-09-14 DIAGNOSIS — R112 Nausea with vomiting, unspecified: Secondary | ICD-10-CM

## 2011-09-14 LAB — BASIC METABOLIC PANEL
BUN: 9 mg/dL (ref 6–23)
CO2: 25 mEq/L (ref 19–32)
Calcium: 8.5 mg/dL (ref 8.4–10.5)
Chloride: 104 mEq/L (ref 96–112)
Creatinine, Ser: 1.01 mg/dL (ref 0.50–1.10)
GFR calc Af Amer: 73 mL/min — ABNORMAL LOW (ref >90)
GFR calc non Af Amer: 63 mL/min — ABNORMAL LOW (ref >90)
Glucose, Bld: 98 mg/dL (ref 70–99)
Potassium: 4.1 mEq/L (ref 3.5–5.1)
Sodium: 138 mEq/L (ref 135–145)

## 2011-09-14 LAB — CBC
HCT: 22 % — ABNORMAL LOW (ref 36.0–46.0)
Hemoglobin: 7.5 g/dL — ABNORMAL LOW (ref 12.0–15.0)
MCH: 26.8 pg (ref 26.0–34.0)
MCHC: 34.1 g/dL (ref 30.0–36.0)
MCV: 78.6 fL (ref 78.0–100.0)
Platelets: 126 10*3/uL — ABNORMAL LOW (ref 150–400)
RBC: 2.8 MIL/uL — ABNORMAL LOW (ref 3.87–5.11)
RDW: 14.7 % (ref 11.5–15.5)
WBC: 7.1 10*3/uL (ref 4.0–10.5)

## 2011-09-14 LAB — PROTIME-INR
INR: 2.28 — ABNORMAL HIGH (ref 0.00–1.49)
Prothrombin Time: 25.5 seconds — ABNORMAL HIGH (ref 11.6–15.2)

## 2011-09-14 LAB — PREPARE RBC (CROSSMATCH)

## 2011-09-14 MED ORDER — WARFARIN SODIUM 5 MG PO TABS
5.0000 mg | ORAL_TABLET | Freq: Once | ORAL | Status: AC
  Administered 2011-09-14: 5 mg via ORAL
  Filled 2011-09-14: qty 1

## 2011-09-14 MED ORDER — FLUTICASONE PROPIONATE 50 MCG/ACT NA SUSP
1.0000 | Freq: Every day | NASAL | Status: DC
Start: 2011-09-14 — End: 2011-09-15
  Administered 2011-09-15: 1 via NASAL
  Filled 2011-09-14: qty 16

## 2011-09-14 NOTE — Progress Notes (Signed)
PHARMACIST - PHYSICIAN COMMUNICATION Regarding:  Quinine (home medication)  [U.S. Boxed Warning]: Quinine is not recommended for the prevention/treatment of nocturnal leg cramps due to the potential for severe and/or life-threatening side effects (eg, cardiac arrhythmias, thrombocytopenia, and HUS/TTP, severe hypersensitivity reactions). These risks, as well as the absence of clinical effectiveness, do not justify its use in the unapproved/unlabeled prevention and/or treatment of leg cramps.  Plan:  Patient takes quinine at bedtime PRN.  For the reasons listed above, the PRN home medication will not be continued inpatient.  If you have questions about this conversion, please contact the pharmacy department.  Thank you.  Clance Boll, PharmD, BCPS Pager: [phone] 09/14/2011 10:32 AM

## 2011-09-14 NOTE — Progress Notes (Signed)
ANTICOAGULATION CONSULT NOTE - Follow-Up  Pharmacy Consult for Coumadin  Indication: VTE prophylaxis  Allergies  Allergen Reactions  . Beta Adrenergic Blockers Other (See Comments)    RED EYES AND CONGESTION  . Penicillins Hives, Itching and Swelling  . Sulfa Antibiotics Hives    RED EYES  . Thimerosal Hives and Itching   Patient Measurements: Height: 5\' 4"  (162.6 cm) Weight: 275 lb (124.739 kg) IBW/kg (Calculated) : 54.7   Vital Signs: Temp: 99.4 F (37.4 C) (05/27 0607) Temp src: Oral (05/27 0607) BP: 109/75 mmHg (05/27 0607) Pulse Rate: 92  (05/27 0607)  Labs:  Basename 09/14/11 0416 09/13/11 1030 09/13/11 0413 09/12/11 0417  HGB 7.5* 8.4* -- --  HCT 22.0* 24.9* -- 25.1*  PLT 126* 114* -- 97*  APTT -- -- -- --  LABPROT 25.5* -- 21.8* 18.7*  INR 2.28* -- 1.86* 1.53*  HEPARINUNFRC -- -- -- --  CREATININE 1.01 0.94 -- --  CKTOTAL -- -- -- --  CKMB -- -- -- --  TROPONINI -- -- -- --   Estimated Creatinine Clearance: 85.1 ml/min (by C-G formula based on Cr of 1.01).  Medical History: Past Medical History  Diagnosis Date  . Hypertension   . Arthritis   . Asthma   . Morbid obesity   . PE (pulmonary embolism)   . DVT (deep venous thrombosis)   . GERD (gastroesophageal reflux disease)   . Dysfunctional uterine bleeding   . Headache     MIGRAINES IN PAST  . Sleep apnea, obstructive     USES C-PAP  . Glaucoma    Assessment:  [name] on chronic Coumadin for hx PE/DVT and resumed Coumadin post L TKA.  Lovenox 30mg  SQ q12h ordered for bridging until INR response adequate.  Will d/c today for INR > 2.  Home warfarin dosage reported as 10 mg 3 days per week and 5 mg 4 days per week.   INR therapeutic, but increasing rapidly.  Will reduce dose today.  CBC low, monitoring. No bleeding/complications reported.  Goal of Therapy:  INR 2-3   Plan:   Coumadin 5 mg today.   D/c Lovenox.  When discharged to SNF, agree with resuming home warfarin  dosage. Follow up INR in AM.  Clance Boll PharmD, BCPS 10:13 AM 09/14/2011

## 2011-09-14 NOTE — Progress Notes (Signed)
Subjective: Doing well, mild peri-oral numbness better, no numbness in hands  Objective: Vital signs in last 24 hours: Temp:  [99.4 F (37.4 C)-99.7 F (37.6 C)] 99.4 F (37.4 C) (05/27 0607) Pulse Rate:  [73-111] 92  (05/27 0607) Resp:  [16] 16  (05/27 0607) BP: (109-123)/(75-84) 109/75 mmHg (05/27 0607) SpO2:  [96 %-100 %] 100 % (05/27 1610) Weight change:  Last BM Date: 09/13/11  Intake/Output from previous day: 05/26 0701 - 05/27 0700 In: 1200 [P.O.:1200] Out: -  Total I/O In: 240 [P.O.:240] Out: -    Physical Exam: General: Alert, awake, oriented x3, in no acute distress. HEENT: No bruits, no goiter. Heart: Regular rate and rhythm, without murmurs, rubs, gallops. Lungs: Clear to auscultation bilaterally. Abdomen: Soft, nontender, nondistended, positive bowel sounds. Extremities: No clubbing cyanosis or edema with positive pedal pulses. R knee with dressing Neuro: Grossly intact, nonfocal.    Lab Results: Basic Metabolic Panel:  Basename 09/14/11 0416 09/13/11 1030  NA 138 138  K 4.1 3.6  CL 104 104  CO2 25 25  GLUCOSE 98 120*  BUN 9 8  CREATININE 1.01 0.94  CALCIUM 8.5 8.6  MG -- --  PHOS -- --   Liver Function Tests: No results found for this basename: AST:2,ALT:2,ALKPHOS:2,BILITOT:2,PROT:2,ALBUMIN:2 in the last 72 hours No results found for this basename: LIPASE:2,AMYLASE:2 in the last 72 hours No results found for this basename: AMMONIA:2 in the last 72 hours CBC:  Basename 09/14/11 0416 09/13/11 1030  WBC 7.1 7.1  NEUTROABS -- --  HGB 7.5* 8.4*  HCT 22.0* 24.9*  MCV 78.6 79.6  PLT 126* 114*   Cardiac Enzymes: No results found for this basename: CKTOTAL:3,CKMB:3,CKMBINDEX:3,TROPONINI:3 in the last 72 hours BNP: No results found for this basename: PROBNP:3 in the last 72 hours D-Dimer: No results found for this basename: DDIMER:2 in the last 72 hours CBG: No results found for this basename: GLUCAP:6 in the last 72 hours Hemoglobin  A1C: No results found for this basename: HGBA1C in the last 72 hours Fasting Lipid Panel: No results found for this basename: CHOL,HDL,LDLCALC,TRIG,CHOLHDL,LDLDIRECT in the last 72 hours Thyroid Function Tests: No results found for this basename: TSH,T4TOTAL,FREET4,T3FREE,THYROIDAB in the last 72 hours Anemia Panel: No results found for this basename: VITAMINB12,FOLATE,FERRITIN,TIBC,IRON,RETICCTPCT in the last 72 hours Coagulation:  Basename 09/14/11 0416 09/13/11 0413  LABPROT 25.5* 21.8*  INR 2.28* 1.86*   Urine Drug Screen: Drugs of Abuse  No results found for this basename: labopia,  cocainscrnur,  labbenz,  amphetmu,  thcu,  labbarb    Alcohol Level: No results found for this basename: ETH:2 in the last 72 hours Urinalysis: No results found for this basename: COLORURINE:2,APPERANCEUR:2,LABSPEC:2,PHURINE:2,GLUCOSEU:2,HGBUR:2,BILIRUBINUR:2,KETONESUR:2,PROTEINUR:2,UROBILINOGEN:2,NITRITE:2,LEUKOCYTESUR:2 in the last 72 hours  No results found for this or any previous visit (from the past 240 hour(s)).  Studies/Results: No results found.  Medications: Scheduled Meds:    . bimatoprost  1 drop Left Eye QHS  . brimonidine  1 drop Both Eyes TID  . brinzolamide  1 drop Left Eye TID  . cyanocobalamin  2,500 mcg Oral Daily  . diltiazem  240 mg Oral QHS  . docusate sodium  100 mg Oral BID  . gabapentin  100 mg Oral QHS  . loratadine  10 mg Oral Daily  . magnesium chloride  2 tablet Oral QHS  . magnesium chloride  1 tablet Oral Daily  . niacin  500 mg Oral Q breakfast  . pantoprazole  80 mg Oral Q1200  . polyvinyl alcohol  1 drop Both  Eyes TID  . potassium chloride SA  20 mEq Oral BID  . warfarin  5 mg Oral ONCE-1800  . warfarin  7.5 mg Oral ONCE-1800  . Warfarin - Pharmacist Dosing Inpatient   Does not apply q1800  . DISCONTD: enoxaparin  30 mg Subcutaneous Q12H  . DISCONTD: gabapentin  100 mg Oral TID   Continuous Infusions:   PRN Meds:.acetaminophen, acetaminophen,  bisacodyl, diphenhydrAMINE, HYDROmorphone (DILAUDID) injection, menthol-cetylpyridinium, methocarbamol (ROBAXIN) IV, methocarbamol, metoCLOPramide (REGLAN) injection, metoCLOPramide, ondansetron (ZOFRAN) IV, ondansetron, oxyCODONE, phenol, polyethylene glycol, DISCONTD: quiNINE  Assessment/Plan:  1. S/p L TKA per primary  2. Post op anemia: improved, s/p 1unit PRBC 5/23, Transfuse 1 more unit today BP better, looks better overall 3. Paresthesias: resolved, peri-oral numbness could be from Hospital CPAP mask, B12 normal  No need for MRI Cspine since her upper ext paresthesias resolved 3days ago and peri-oral numbness would not be explained by  cervical myelopathy,  no need for further w/u now 4. Mild hypotension: resolved Hold HCTZ/ARB today, could restart HCTZ if BP starts trending up 5. OSA: on CPAP  6. H/o DVT/PE, IVC filter, restarted on coumadin with lovenox bridge  7. ARF: could be from hypotension, improved w/ blood and holding ARB/HCTZ 8. Low grade fever: encouraged incentive spirometer, continue to monitor DC planning     LOS: 5 days   [facility] Pager: [phone] 09/14/2011, 12:01 PM

## 2011-09-14 NOTE — Progress Notes (Signed)
CSW continues to follow.  CIR vs. SNF.  Reviewed CIR's note.  Will await insurance decision and if CIR not approved will plan for [facility].    Fleet Contras (coverage for [place])  [phone]

## 2011-09-14 NOTE — Progress Notes (Signed)
Physical Therapy Treatment Patient Details Name: [name] MRN: [id number] DOB: 05-17-1960 Today's Date: 09/14/2011 Time: 0932-1008 PT Time Calculation (min): 36 min  PT Assessment / Plan / Recommendation Comments on Treatment Session       Follow Up Recommendations  Skilled nursing facility    Barriers to Discharge        Equipment Recommendations  Defer to next venue    Recommendations for Other Services OT consult  Frequency 7X/week   Plan Discharge plan remains appropriate    Precautions / Restrictions Precautions Precautions: Knee;Other (comment) Required Braces or Orthoses: Knee Immobilizer - Left Knee Immobilizer - Left: Discontinue once straight leg raise with < 10 degree lag Restrictions Weight Bearing Restrictions: No Other Position/Activity Restrictions: L WBAT   Pertinent Vitals/Pain     Mobility  Bed Mobility Bed Mobility: Sit to Supine Sit to Supine: 4: Min assist Details for Bed Mobility Assistance: cues for sequence and for use of UEs and R LE to self-assist Transfers Transfers: Sit to Stand;Stand to Sit Sit to Stand: 4: Min assist Stand to Sit: 4: Min assist Details for Transfer Assistance: cues for use of UEs and for LE management Ambulation/Gait Ambulation/Gait Assistance: 4: Min assist Ambulation Distance (Feet): 80 Feet (80' x 2) Ambulation/Gait Assistance Details: min cues for position from RW Gait Pattern: Step-to pattern    Exercises Total Joint Exercises Ankle Circles/Pumps: AROM;Both;20 reps Quad Sets: AROM;20 reps;Both;Supine Heel Slides: AAROM;20 reps;Supine;Left Straight Leg Raises: AAROM;Supine;20 reps;Left   PT Diagnosis:    PT Problem List:   PT Treatment Interventions:     PT Goals Acute Rehab PT Goals PT Goal Formulation: With patient Time For Goal Achievement: 09/17/11 Potential to Achieve Goals: Good Pt will go Supine/Side to Sit: with min assist PT Goal: Supine/Side to Sit - Progress: Progressing toward  goal Pt will go Sit to Supine/Side: with min assist PT Goal: Sit to Supine/Side - Progress: Progressing toward goal Pt will go Sit to Stand: with supervision PT Goal: Sit to Stand - Progress: Progressing toward goal Pt will go Stand to Sit: with supervision PT Goal: Stand to Sit - Progress: Progressing toward goal Pt will Ambulate: 51 - 150 feet;with min assist;with rolling walker PT Goal: Ambulate - Progress: Progressing toward goal  Visit Information  Last PT Received On: 09/14/11 Assistance Needed: +1    Subjective Data  Subjective: My knee is a little tender Patient Stated Goal: Resume previous lifestyle with decreased pain   Cognition  Overall Cognitive Status: Appears within functional limits for tasks assessed/performed Arousal/Alertness: Awake/alert Orientation Level: Appears intact for tasks assessed Behavior During Session: [facility] for tasks performed    Balance     End of Session PT - End of Session Equipment Utilized During Treatment: Left knee immobilizer Activity Tolerance: Patient tolerated treatment well Patient left: in bed;with call bell/phone within reach Nurse Communication: Mobility status CPM Left Knee CPM Left Knee: Off    [name] 09/14/2011, 10:42 AM

## 2011-09-14 NOTE — Progress Notes (Signed)
Unable to bring patient to CIR on Friday 09/11/11 due to insurance not giving a decision. [employer] stated they had up to 72 hrs to make a decision on Friday.  Insurance is closed today for the holiday and anticipate an answer in a.m. [employer] may not approve CIR. I have discussed with SW, who has started SNF placement if needed. Thanks and will f/u tomorrow. Toni Amend adm coordinator [phone]

## 2011-09-14 NOTE — Progress Notes (Signed)
Subjective: 5 Days Post-Op Procedure(s) (LRB): TOTAL KNEE ARTHROPLASTY (Left) Patient reports pain as mild.  No CP, SOB, palpitations  Objective: Vital signs in last 24 hours: Temp:  [99.4 F (37.4 C)-99.7 F (37.6 C)] 99.4 F (37.4 C) (05/27 0607) Pulse Rate:  [73-111] 92  (05/27 0607) Resp:  [16] 16  (05/27 0607) BP: (109-123)/(75-84) 109/75 mmHg (05/27 0607) SpO2:  [96 %-100 %] 100 % (05/27 0607)  Intake/Output from previous day: 05/26 0701 - 05/27 0700 In: 1200 [P.O.:1200] Out: -  Intake/Output this shift: Total I/O In: 240 [P.O.:240] Out: -    Basename 09/14/11 0416 09/13/11 1030 09/12/11 0417  HGB 7.5* 8.4* 8.7*    Basename 09/14/11 0416 09/13/11 1030  WBC 7.1 7.1  RBC 2.80* 3.13*  HCT 22.0* 24.9*  PLT 126* 114*    Basename 09/14/11 0416 09/13/11 1030  NA 138 138  K 4.1 3.6  CL 104 104  CO2 25 25  BUN 9 8  CREATININE 1.01 0.94  GLUCOSE 98 120*  CALCIUM 8.5 8.6    Basename 09/14/11 0416 09/13/11 0413  LABPT -- --  INR 2.28* 1.86*    Incision: dressing C/D/I  Assessment/Plan: 5 Days Post-Op Procedure(s) (LRB): TOTAL KNEE ARTHROPLASTY (Left) Up with therapy Inpt rehab tomorrow  Harlo Jaso A 09/14/2011, 10:57 AM

## 2011-09-14 NOTE — Progress Notes (Signed)
Physical Therapy Treatment Patient Details Name: [name] MRN: [id number] DOB: 05-03-1960 Today's Date: 09/14/2011 Time: 9147-8295 PT Time Calculation (min): 18 min  PT Assessment / Plan / Recommendation Comments on Treatment Session       Follow Up Recommendations  Skilled nursing facility    Barriers to Discharge        Equipment Recommendations  Defer to next venue    Recommendations for Other Services OT consult  Frequency 7X/week   Plan Discharge plan remains appropriate    Precautions / Restrictions Precautions Required Braces or Orthoses: Knee Immobilizer - Left Knee Immobilizer - Left: Discontinue once straight leg raise with < 10 degree lag Restrictions Weight Bearing Restrictions: No Other Position/Activity Restrictions: L WBAT   Pertinent Vitals/Pain Pt reports min pain with activity    Mobility  Bed Mobility Bed Mobility: Sit to Supine Sit to Supine: 4: Min assist Details for Bed Mobility Assistance: cues for sequence and for use of UEs and R LE to self-assist Transfers Transfers: Sit to Stand;Stand to Sit Sit to Stand: 4: Min assist Stand to Sit: 4: Min assist Details for Transfer Assistance: cues for use of UEs and for LE management Ambulation/Gait Ambulation/Gait Assistance: 4: Min assist Ambulation Distance (Feet): 75 Feet (x2) Assistive device: Rolling walker Ambulation/Gait Assistance Details: min cues for posture and pacing Gait Pattern: Step-to pattern    Exercises     PT Diagnosis:    PT Problem List:   PT Treatment Interventions:     PT Goals Acute Rehab PT Goals PT Goal Formulation: With patient Time For Goal Achievement: 09/17/11 Potential to Achieve Goals: Good Pt will go Supine/Side to Sit: with min assist PT Goal: Supine/Side to Sit - Progress: Progressing toward goal Pt will go Sit to Supine/Side: with min assist PT Goal: Sit to Supine/Side - Progress: Progressing toward goal Pt will go Sit to Stand: with  supervision PT Goal: Sit to Stand - Progress: Progressing toward goal Pt will go Stand to Sit: with supervision PT Goal: Stand to Sit - Progress: Progressing toward goal Pt will Ambulate: 51 - 150 feet;with min assist;with rolling walker PT Goal: Ambulate - Progress: Progressing toward goal  Visit Information  Last PT Received On: 09/14/11 Assistance Needed: +1    Subjective Data  Subjective: They said I need some more blood Patient Stated Goal: Resume previous lifestyle with decreased pain   Cognition  Overall Cognitive Status: Appears within functional limits for tasks assessed/performed Arousal/Alertness: Awake/alert Orientation Level: Appears intact for tasks assessed Behavior During Session: [facility] for tasks performed    Balance     End of Session PT - End of Session Equipment Utilized During Treatment: Left knee immobilizer Activity Tolerance: Patient tolerated treatment well Patient left: in bed;with call bell/phone within reach Nurse Communication: Mobility status    [name] 09/14/2011, 2:07 PM

## 2011-09-14 NOTE — Progress Notes (Signed)
Spoke with patient about nocturnal CPAP. Pt stated she was not ready to get on cpap, but will place it on herself when she is ready for bed. Patient is aware that she may call for assistance if needed.

## 2011-09-14 NOTE — Progress Notes (Signed)
OT Cancellation Note  Treatment cancelled today due to patient's refusal to participate due to returning to bed. Will re check on pt as schedule allows. Thanks,  Alba Cory 09/14/2011, 11:18 AM

## 2011-09-14 NOTE — Progress Notes (Signed)
Patient refusing MRI. Patient states mouth numbness has decreased.

## 2011-09-15 ENCOUNTER — Inpatient Hospital Stay (HOSPITAL_COMMUNITY)
Admission: RE | Admit: 2011-09-15 | Discharge: 2011-09-22 | DRG: 462 | Disposition: A | Discharge status: Home / Self Care | Payer: BC Managed Care – PPO | Source: Ambulatory Visit | Attending: Physical Medicine & Rehabilitation | Admitting: Physical Medicine & Rehabilitation

## 2011-09-15 DIAGNOSIS — Z5189 Encounter for other specified aftercare: Secondary | ICD-10-CM

## 2011-09-15 DIAGNOSIS — Z96659 Presence of unspecified artificial knee joint: Secondary | ICD-10-CM

## 2011-09-15 DIAGNOSIS — M1710 Unilateral primary osteoarthritis, unspecified knee: Secondary | ICD-10-CM

## 2011-09-15 DIAGNOSIS — Z86711 Personal history of pulmonary embolism: Secondary | ICD-10-CM

## 2011-09-15 DIAGNOSIS — Z86718 Personal history of other venous thrombosis and embolism: Secondary | ICD-10-CM

## 2011-09-15 DIAGNOSIS — H409 Unspecified glaucoma: Secondary | ICD-10-CM

## 2011-09-15 DIAGNOSIS — Z7901 Long term (current) use of anticoagulants: Secondary | ICD-10-CM

## 2011-09-15 DIAGNOSIS — M4712 Other spondylosis with myelopathy, cervical region: Secondary | ICD-10-CM

## 2011-09-15 DIAGNOSIS — G4730 Sleep apnea, unspecified: Secondary | ICD-10-CM | POA: Insufficient documentation

## 2011-09-15 DIAGNOSIS — D649 Anemia, unspecified: Secondary | ICD-10-CM | POA: Diagnosis present

## 2011-09-15 DIAGNOSIS — Z6841 Body Mass Index (BMI) 40.0 and over, adult: Secondary | ICD-10-CM

## 2011-09-15 DIAGNOSIS — Z96652 Presence of left artificial knee joint: Secondary | ICD-10-CM | POA: Diagnosis present

## 2011-09-15 DIAGNOSIS — R350 Frequency of micturition: Secondary | ICD-10-CM

## 2011-09-15 DIAGNOSIS — J45909 Unspecified asthma, uncomplicated: Secondary | ICD-10-CM

## 2011-09-15 DIAGNOSIS — G4733 Obstructive sleep apnea (adult) (pediatric): Secondary | ICD-10-CM

## 2011-09-15 DIAGNOSIS — K219 Gastro-esophageal reflux disease without esophagitis: Secondary | ICD-10-CM

## 2011-09-15 DIAGNOSIS — D62 Acute posthemorrhagic anemia: Secondary | ICD-10-CM

## 2011-09-15 DIAGNOSIS — R3915 Urgency of urination: Secondary | ICD-10-CM

## 2011-09-15 DIAGNOSIS — Z79899 Other long term (current) drug therapy: Secondary | ICD-10-CM

## 2011-09-15 DIAGNOSIS — I10 Essential (primary) hypertension: Secondary | ICD-10-CM

## 2011-09-15 DIAGNOSIS — Z9884 Bariatric surgery status: Secondary | ICD-10-CM

## 2011-09-15 LAB — URINALYSIS, ROUTINE W REFLEX MICROSCOPIC
Bilirubin Urine: NEGATIVE
Glucose, UA: NEGATIVE mg/dL
Hgb urine dipstick: NEGATIVE
Ketones, ur: NEGATIVE mg/dL
Leukocytes, UA: NEGATIVE
Nitrite: NEGATIVE
Protein, ur: NEGATIVE mg/dL
Specific Gravity, Urine: 1.013 (ref 1.005–1.030)
Urobilinogen, UA: 0.2 mg/dL (ref 0.0–1.0)
pH: 6 (ref 5.0–8.0)

## 2011-09-15 LAB — CBC
HCT: 26 % — ABNORMAL LOW (ref 36.0–46.0)
Hemoglobin: 8.9 g/dL — ABNORMAL LOW (ref 12.0–15.0)
MCH: 27.3 pg (ref 26.0–34.0)
MCHC: 34.2 g/dL (ref 30.0–36.0)
MCV: 79.8 fL (ref 78.0–100.0)
Platelets: 143 10*3/uL — ABNORMAL LOW (ref 150–400)
RBC: 3.26 MIL/uL — ABNORMAL LOW (ref 3.87–5.11)
RDW: 14.7 % (ref 11.5–15.5)
WBC: 7.1 10*3/uL (ref 4.0–10.5)

## 2011-09-15 LAB — PROTIME-INR
INR: 2.43 — ABNORMAL HIGH (ref 0.00–1.49)
Prothrombin Time: 26.8 seconds — ABNORMAL HIGH (ref 11.6–15.2)

## 2011-09-15 LAB — TYPE AND SCREEN
ABO/RH(D): O POS
Antibody Screen: NEGATIVE
Unit division: 0

## 2011-09-15 MED ORDER — TRAMADOL HCL 50 MG PO TABS
50.0000 mg | ORAL_TABLET | Freq: Four times a day (QID) | ORAL | Status: DC | PRN
  Administered 2011-09-16: 50 mg via ORAL
  Filled 2011-09-15: qty 1

## 2011-09-15 MED ORDER — VITAMIN B-12 1000 MCG PO TABS
2500.0000 ug | ORAL_TABLET | Freq: Every day | ORAL | Status: DC
  Administered 2011-09-16: 2500 ug via ORAL
  Administered 2011-09-17: 2000 ug via ORAL
  Administered 2011-09-18 – 2011-09-19 (×2): 2500 ug via ORAL
  Administered 2011-09-20: 13:00:00 via ORAL
  Administered 2011-09-21 – 2011-09-22 (×2): 2500 ug via ORAL
  Filled 2011-09-15 (×10): qty 1

## 2011-09-15 MED ORDER — POLYSACCHARIDE IRON COMPLEX 150 MG PO CAPS
150.0000 mg | ORAL_CAPSULE | Freq: Two times a day (BID) | ORAL | Status: DC
  Administered 2011-09-15 – 2011-09-21 (×12): 150 mg via ORAL
  Filled 2011-09-15 (×16): qty 1

## 2011-09-15 MED ORDER — WARFARIN SODIUM 10 MG PO TABS
10.0000 mg | ORAL_TABLET | ORAL | Status: DC
  Administered 2011-09-15 – 2011-09-19 (×3): 10 mg via ORAL
  Filled 2011-09-15 (×4): qty 1

## 2011-09-15 MED ORDER — OXYCODONE HCL 10 MG PO TB12
10.0000 mg | ORAL_TABLET | Freq: Two times a day (BID) | ORAL | Status: DC
  Administered 2011-09-15 – 2011-09-22 (×14): 10 mg via ORAL
  Filled 2011-09-15 (×15): qty 1

## 2011-09-15 MED ORDER — FLUTICASONE PROPIONATE 50 MCG/ACT NA SUSP
1.0000 | Freq: Every day | NASAL | Status: DC
  Administered 2011-09-16 – 2011-09-21 (×6): 1 via NASAL
  Filled 2011-09-15: qty 16

## 2011-09-15 MED ORDER — LORATADINE 10 MG PO TABS
10.0000 mg | ORAL_TABLET | Freq: Every day | ORAL | Status: DC
  Administered 2011-09-16 – 2011-09-22 (×7): 10 mg via ORAL
  Filled 2011-09-15 (×9): qty 1

## 2011-09-15 MED ORDER — PROCHLORPERAZINE MALEATE 5 MG PO TABS
5.0000 mg | ORAL_TABLET | Freq: Four times a day (QID) | ORAL | Status: DC | PRN
  Filled 2011-09-15: qty 2

## 2011-09-15 MED ORDER — WARFARIN SODIUM 5 MG PO TABS
5.0000 mg | ORAL_TABLET | ORAL | Status: DC
  Administered 2011-09-16 – 2011-09-21 (×4): 5 mg via ORAL
  Filled 2011-09-15 (×4): qty 1

## 2011-09-15 MED ORDER — NIACIN 500 MG PO TABS
500.0000 mg | ORAL_TABLET | Freq: Every day | ORAL | Status: DC
  Filled 2011-09-15: qty 1

## 2011-09-15 MED ORDER — GABAPENTIN 100 MG PO CAPS
100.0000 mg | ORAL_CAPSULE | Freq: Every day | ORAL | Status: DC
  Administered 2011-09-15 – 2011-09-21 (×7): 100 mg via ORAL
  Filled 2011-09-15 (×8): qty 1

## 2011-09-15 MED ORDER — DOCUSATE SODIUM 100 MG PO CAPS
100.0000 mg | ORAL_CAPSULE | Freq: Two times a day (BID) | ORAL | Status: DC
  Administered 2011-09-15 – 2011-09-22 (×14): 100 mg via ORAL
  Filled 2011-09-15 (×16): qty 1

## 2011-09-15 MED ORDER — ACETAMINOPHEN 325 MG PO TABS
325.0000 mg | ORAL_TABLET | ORAL | Status: DC | PRN
  Administered 2011-09-16 – 2011-09-21 (×7): 650 mg via ORAL
  Filled 2011-09-15 (×7): qty 2

## 2011-09-15 MED ORDER — PROCHLORPERAZINE 25 MG RE SUPP
12.5000 mg | Freq: Four times a day (QID) | RECTAL | Status: DC | PRN
  Filled 2011-09-15: qty 1

## 2011-09-15 MED ORDER — DIPHENHYDRAMINE HCL 12.5 MG/5ML PO ELIX
12.5000 mg | ORAL_SOLUTION | ORAL | Status: DC | PRN
  Filled 2011-09-15: qty 10

## 2011-09-15 MED ORDER — FLEET ENEMA 7-19 GM/118ML RE ENEM
1.0000 | ENEMA | Freq: Once | RECTAL | Status: AC | PRN
  Filled 2011-09-15: qty 1

## 2011-09-15 MED ORDER — POLYETHYLENE GLYCOL 3350 17 G PO PACK
17.0000 g | PACK | Freq: Every day | ORAL | Status: DC | PRN
  Administered 2011-09-16: 17 g via ORAL
  Filled 2011-09-15: qty 1

## 2011-09-15 MED ORDER — ALPRAZOLAM 0.25 MG PO TABS
0.2500 mg | ORAL_TABLET | Freq: Every evening | ORAL | Status: DC | PRN
  Administered 2011-09-15: 0.25 mg via ORAL
  Filled 2011-09-15: qty 1

## 2011-09-15 MED ORDER — PROCHLORPERAZINE EDISYLATE 5 MG/ML IJ SOLN
5.0000 mg | Freq: Four times a day (QID) | INTRAMUSCULAR | Status: DC | PRN
  Filled 2011-09-15: qty 2

## 2011-09-15 MED ORDER — POLYVINYL ALCOHOL 1.4 % OP SOLN
1.0000 [drp] | Freq: Three times a day (TID) | OPHTHALMIC | Status: DC
  Administered 2011-09-15 – 2011-09-22 (×18): 1 [drp] via OPHTHALMIC

## 2011-09-15 MED ORDER — PHENOL 1.4 % MT LIQD
1.0000 | OROMUCOSAL | Status: DC | PRN
  Filled 2011-09-15: qty 177

## 2011-09-15 MED ORDER — ALUM & MAG HYDROXIDE-SIMETH 200-200-20 MG/5ML PO SUSP: 30.0000 mL | ORAL | Status: DC | PRN

## 2011-09-15 MED ORDER — MENTHOL 3 MG MT LOZG
1.0000 | LOZENGE | OROMUCOSAL | Status: DC | PRN
  Filled 2011-09-15: qty 9

## 2011-09-15 MED ORDER — BISACODYL 10 MG RE SUPP: 10.0000 mg | Freq: Every day | RECTAL | Status: DC | PRN

## 2011-09-15 MED ORDER — BRINZOLAMIDE 1 % OP SUSP
1.0000 [drp] | Freq: Three times a day (TID) | OPHTHALMIC | Status: DC
  Administered 2011-09-15 – 2011-09-22 (×21): 1 [drp] via OPHTHALMIC

## 2011-09-15 MED ORDER — BIMATOPROST 0.01 % OP SOLN
1.0000 [drp] | Freq: Every day | OPHTHALMIC | Status: DC
  Administered 2011-09-15 – 2011-09-21 (×7): 1 [drp] via OPHTHALMIC

## 2011-09-15 MED ORDER — DILTIAZEM HCL ER 240 MG PO CP24
240.0000 mg | ORAL_CAPSULE | Freq: Every day | ORAL | Status: DC
  Administered 2011-09-15 – 2011-09-21 (×7): 240 mg via ORAL
  Filled 2011-09-15 (×9): qty 1

## 2011-09-15 MED ORDER — PANTOPRAZOLE SODIUM 40 MG PO TBEC
80.0000 mg | DELAYED_RELEASE_TABLET | Freq: Every day | ORAL | Status: DC
  Administered 2011-09-16 – 2011-09-22 (×7): 80 mg via ORAL
  Filled 2011-09-15 (×2): qty 2
  Filled 2011-09-15 (×2): qty 1
  Filled 2011-09-15 (×2): qty 2
  Filled 2011-09-15: qty 1
  Filled 2011-09-15: qty 2
  Filled 2011-09-15: qty 1
  Filled 2011-09-15: qty 2
  Filled 2011-09-15: qty 1
  Filled 2011-09-15: qty 2

## 2011-09-15 MED ORDER — GUAIFENESIN-DM 100-10 MG/5ML PO SYRP: 5.0000 mL | ORAL_SOLUTION | Freq: Four times a day (QID) | ORAL | Status: DC | PRN

## 2011-09-15 MED ORDER — ALPRAZOLAM 0.25 MG PO TABS
0.2500 mg | ORAL_TABLET | Freq: Every evening | ORAL | Status: DC | PRN
  Administered 2011-09-15 – 2011-09-21 (×7): 0.25 mg via ORAL
  Filled 2011-09-15 (×7): qty 1

## 2011-09-15 MED ORDER — MAGNESIUM CHLORIDE 64 MG PO TBEC
1.0000 | DELAYED_RELEASE_TABLET | Freq: Every day | ORAL | Status: DC
  Filled 2011-09-15 (×2): qty 1

## 2011-09-15 MED ORDER — TRAZODONE HCL 50 MG PO TABS: 25.0000 mg | ORAL_TABLET | Freq: Every evening | ORAL | Status: DC | PRN

## 2011-09-15 MED ORDER — WARFARIN - PHARMACIST DOSING INPATIENT
Freq: Every day | Status: DC
  Administered 2011-09-17: 18:00:00

## 2011-09-15 MED ORDER — MAGNESIUM CHLORIDE 64 MG PO TBEC
2.0000 | DELAYED_RELEASE_TABLET | Freq: Every day | ORAL | Status: DC
  Administered 2011-09-15: 128 mg via ORAL
  Filled 2011-09-15 (×3): qty 2

## 2011-09-15 MED ORDER — METHOCARBAMOL 500 MG PO TABS
500.0000 mg | ORAL_TABLET | Freq: Four times a day (QID) | ORAL | Status: DC | PRN
  Administered 2011-09-15 – 2011-09-22 (×18): 500 mg via ORAL
  Filled 2011-09-15 (×16): qty 1

## 2011-09-15 MED ORDER — OXYCODONE-ACETAMINOPHEN 5-325 MG PO TABS
1.0000 | ORAL_TABLET | ORAL | Status: DC | PRN
  Administered 2011-09-15 – 2011-09-22 (×20): 2 via ORAL
  Filled 2011-09-15 (×22): qty 2

## 2011-09-15 MED ORDER — BRIMONIDINE TARTRATE 0.2 % OP SOLN
1.0000 [drp] | Freq: Three times a day (TID) | OPHTHALMIC | Status: DC
  Administered 2011-09-15 – 2011-09-22 (×21): 1 [drp] via OPHTHALMIC

## 2011-09-15 NOTE — Progress Notes (Signed)
Physical Therapy Treatment Patient Details Name: [name] MRN: [id number] DOB: [birth year] Today's Date: 09/15/2011 Time: 1020-1050 PT Time Calculation (min): 30 min  PT Assessment / Plan / Recommendation  Pt plans to D/C to CIR today Comments on Treatment Session       Follow Up Recommendations       Barriers to Discharge        Equipment Recommendations    Defer to next venue   Recommendations for Other Services    Frequency   x7/wk  Plan   Skilled nursing   Precautions / Restrictions Precautions Precautions: Knee Restrictions Weight Bearing Restrictions: No   Pertinent Vitals/Pain C/o 71/ l knee pain with TE's/ICE applied     Mobility  Bed Mobility Sit to Supine: 4: Min assist Details for Bed Mobility Assistance: Assisted pt back to bed with mod assist to support L LE up onto bed Transfers Transfers: Sit to Stand;Stand to Sit Sit to Stand: 4: Min assist;From chair/3-in-1 Stand to Sit: 4: Min assist;To bed Details for Transfer Assistance: 25% VC's on safety with turns and increased time Ambulation/Gait Ambulation/Gait Assistance: 4: Min assist Ambulation Distance (Feet): 55 Feet Assistive device: Rolling walker Ambulation/Gait Assistance Details: 25% VC's on proper walker to self distance and increase posture Gait Pattern: Step-to pattern;Decreased stance time - left;Wide base of support Stairs: No Wheelchair Mobility Wheelchair Mobility: No    Exercises Total Joint Exercises Ankle Circles/Pumps: AROM;Both;10 reps;Supine Quad Sets: AROM;Both;10 reps;Supine Gluteal Sets: AROM;Left;10 reps;Supine Towel Squeeze: AROM;Both;10 reps;Supine Hip ABduction/ADduction: AAROM;Left;10 reps;Supine Straight Leg Raises: AAROM;Left;10 reps;Supine    PT Goals Acute Rehab PT Goals PT Goal Formulation: With patient Potential to Achieve Goals: Good Pt will go Sit to Stand: with supervision PT Goal: Sit to Stand - Progress: Progressing toward goal Pt will go Stand to  Sit: with supervision PT Goal: Stand to Sit - Progress: Progressing toward goal Pt will Ambulate: 51 - 150 feet;with min assist;with rolling walker PT Goal: Ambulate - Progress: Progressing toward goal  Visit Information  Last PT Received On: 09/15/11 Assistance Needed: +1    Subjective Data  Subjective: My doctor told me not to sit up for more than an hour Patient Stated Goal: n/a   Cognition  Arousal/Alertness: Awake/alert    Balance   fair with RW  End of Session PT - End of Session Equipment Utilized During Treatment: Gait belt;Left knee immobilizer Activity Tolerance: Patient tolerated treatment well Patient left: in bed;with call bell/phone within reach;Other (comment) (ICE to L knee) Nurse Communication: Mobility status   Felecia Shelling  PTA [facility]  Acute  Rehab Pager     [phone]

## 2011-09-15 NOTE — Plan of Care (Signed)
Overall Plan of Care [facility]) Patient Details Name: [name] MRN: [id number] DOB: [birth year]  Diagnosis: OA of right knee s/p TKA  Primary Diagnosis:    Total knee replacement status, left Co-morbidities: cervical myelopathy, abla  Functional Problem List  Patient demonstrates impairments in the following areas: Balance, Endurance, Motor, Pain and Safety  Basic ADL's: grooming, bathing, dressing and toileting Advanced ADL's: simple meal preparation  Transfers:  bed mobility, bed to chair, toilet, tub/shower, car and furniture Locomotion:  ambulation and stairs  Additional Impairments:  Leisure Awareness  Anticipated Outcomes Item Anticipated Outcome  Eating/Swallowing    Basic self-care  Mod I  Tolieting  Mod I  Bowel/Bladder  Mod I  Transfers  Mod I; Supervision with car  Locomotion  Mod I with RW  Communication    Cognition    Pain  <5  Safety/Judgment    Other     Therapy Plan: PT Frequency: 2-3 X/day, 60-90 minutes OT Frequency: 1-2 X/day, 60-90 minutes     Team Interventions: Item RN PT OT SLP SW TR Other  Self Care/Advanced ADL Retraining  x x      Neuromuscular Re-Education  x x      Therapeutic Activities  x x   x   UE/LE Strength Training/ROM  x x   x   UE/LE Coordination Activities  x x      Visual/Perceptual Remediation/Compensation         DME/Adaptive Equipment Instruction  x x   x   Therapeutic Exercise  x x   x   Balance/Vestibular Training  x x   x    Patient/Family Education  x x   x   Cognitive Remediation/Compensation      x   Functional Mobility Training  x x   x   Ambulation/Gait Training  x x      Stair Training  x       Wheelchair Propulsion/Positioning  x x      Functional Tourist information centre manager Reintegration  x x   x   Dysphagia/Aspiration Film/video editor         Bladder Management x        Bowel Management x        Disease Management/Prevention x x x        Pain Management x x x      Medication Management x        Skin Care/Wound Management x x x      Splinting/Orthotics  x x      Discharge Planning x x x  x x   Psychosocial Support x x x  x x                      Team Discharge Planning: Destination:  Home Projected Follow-up:  PT, Outpatient and no OT follow up recommended at this time Projected Equipment Needs:  no OT equipment, patient reports she has 3-in-1 and padded tub bench; no PT equip at this time, pt reports she has RW Patient/family involved in discharge planning:  Yes  MD ELOS: 1 week Medical Rehab Prognosis:  Excellent Assessment: pt is admitted for cir therapies. Focus will be on balance, knee range of motion, lower extremity strenght, ADL's with goals set at MOD I.

## 2011-09-15 NOTE — Progress Notes (Signed)
Orthopedic Tech Progress Note Patient Details:  [name] [birth year] [id number] Applied overhead frame and trapeze bar. CPM Left Knee CPM Left Knee: On Left Knee Flexion (Degrees): 60  Left Knee Extension (Degrees): 0    Jennye Moccasin 09/15/2011, 1:57 PM

## 2011-09-15 NOTE — Progress Notes (Signed)
Pt transfers to CIR today per [facility]. Report given to CIR, RN ( VERNA). IV d/c'd. Dressing changed this am-CDI.

## 2011-09-15 NOTE — Progress Notes (Signed)
  CARE MANAGEMENT NOTE 09/15/2011  Patient:  [name], [name]   Account Number:  [id number]  Date Initiated:  09/11/2011  Documentation initiated by:  Colleen Can  Subjective/Objective Assessment:   dx total knee replacemnt     Action/Plan:   CIR vs SNF   Anticipated DC Date:  09/12/2011   Anticipated DC Plan:  IP REHAB FACILITY  In-house referral  Clinical Social Worker      DC Associate Professor  CM consult      PAC Choice  IP REHAB   Choice offered to / List presented to:  C-1 Patient   DME arranged  NA      DME agency  NA     HH arranged  NA      HH agency  NA   Status of service:  Completed, signed off Medicare Important Message given?  NO (If response is "NO", the following Medicare IM given date fields will be blank) Date Medicare IM given:   Date Additional Medicare IM given:    Discharge Disposition:  IP REHAB FACILITY  Per UR Regulation:  Reviewed for med. necessity/level of care/duration of stay  IComments:  09/15/2011 Raynelle Bring BSN CCM [phone] Per communication with Rehab Admissions Co-ordinator,  pt has been authorized for CIR, there is bed availability today.

## 2011-09-15 NOTE — Progress Notes (Signed)
Discharge summary sent to payer through [facility]  

## 2011-09-15 NOTE — Progress Notes (Signed)
Pt transferred to rehab from [facility]. Refused handouts and orientation information as she was a prior pt on rehab. Pt's butterfly wish is to go home and be able to take care of herself.

## 2011-09-15 NOTE — PMR Pre-admission (Signed)
PMR Admission Coordinator Pre-Admission Assessment  Patient: [name] is an 51 y.o., female MRN: [id number] DOB: 01-05-1961 Height: 5\' 4"  (162.6 cm) Weight: 124.739 kg (275 lb)  Insurance Information HMO:     PPO: X     PCP:      IPA:      80/20:      OTHER:  PRIMARY: BC[employer] State [employer] Plan      Policy#: [id number]      Subscriber: patient CM Name: Natalia Leatherwood      Phone#: [phone]     Fax#: [phone] Pre-Cert#: [id number]   Approved x 1 week   Employer: [employer] Benefits:  Phone #: [phone]     Name: Juan Quam. Date: 10/19/10     Deduct: $700/met      Out of Pocket Max: $3210/met      Life Max: 0 CIR: $233 copay, then 80/20% after ded      SNF: 80/20% after deductible Outpatient: 80%     Co-Pay: 20% after ded Home Health: 80%      Co-Pay: 20% after ded DME: 80%     Co-Pay: 20% after ded Providers: in network SECONDARY:       Policy#:       Subscriber:  CM Name:       Phone#:      Fax#:  Pre-Cert#:       Employer:  Benefits:  Phone #:      Name:  Eff. Date:      Deduct:      Out of Pocket Max:       Life Max:  CIR:       SNF:  Outpatient:      Co-Pay:  Home Health:       Co-Pay:  DME:      Co-Pay:   Medicaid Application Date:       Case Manager:  Disability Application Date:       Case Worker:   Emergency Contact Information Contact Information    Name Relation Home Work Mobile   Bonita Friend [phone]     Grace Blight [phone]     Copeland,Cynthia Sister [phone]       Current Medical History  Patient Admitting Diagnosis: left total knee replacement with postoperative complications in the setting of morbid obesity  History of Present Illness: [name] is an 51 y.o. Female with history of morbid obesity, OSA, endstage DJD left knee, who elected to undergo L-TKR on 05/23 by Dr Shelle Iron. Post op WBAT and chronic coumadin resumed. Post op reported peri-oral and bilateral hand numbness with hypotension-BP 86/60. Hospitalist  consulted and recommended transfusion with 1 unit PRBC for ABLA and felt numbness due to history of cervical myelopathy v/s fitting of CPAP mask. B 12 levels normal a>2000. Was transfused with additional unit yesterday and Hgb @7 .1 today       Past Medical History  Past Medical History  Diagnosis Date  . Hypertension   . Arthritis   . Asthma   . Morbid obesity   . PE (pulmonary embolism)   . DVT (deep venous thrombosis)   . GERD (gastroesophageal reflux disease)   . Dysfunctional uterine bleeding   . Headache     MIGRAINES IN PAST  . Sleep apnea, obstructive     USES C-PAP  . Glaucoma     Family History  family history includes Alcohol abuse in her father; Arthritis in her father and mother; Cerebral aneurysm in an unspecified family  member; Diabetes in her father and mother; Glaucoma in an unspecified family member; Hypertension in her mother; and Stroke in an unspecified family member.  Prior Rehab/Hospitalizations: CIR less than 1 year ago  Current Medications  Current facility-administered medications:acetaminophen (TYLENOL) suppository 650 mg, 650 mg, Rectal, Q6H PRN, Liam Graham, PA;  acetaminophen (TYLENOL) tablet 650 mg, 650 mg, Oral, Q6H PRN, Liam Graham, PA, 650 mg at 09/15/11 0458;  ALPRAZolam Prudy Feeler) tablet 0.25 mg, 0.25 mg, Oral, QHS PRN, Kirtland Bouchard, PA, 0.25 mg at 09/15/11 0044 bimatoprost (LUMIGAN) 0.01 % ophthalmic solution 1 drop, 1 drop, Left Eye, QHS, Liam Graham, PA, 1 drop at 09/14/11 2250;  bisacodyl (DULCOLAX) suppository 10 mg, 10 mg, Rectal, Daily PRN, Liam Graham, PA;  brimonidine (ALPHAGAN) 0.2 % ophthalmic solution 1 drop, 1 drop, Both Eyes, TID, Liam Graham, PA, 1 drop at 09/14/11 2205 brinzolamide (AZOPT) 1 % ophthalmic suspension 1 drop, 1 drop, Left Eye, TID, Liam Graham, PA, 1 drop at 09/14/11 2226;  cyanocobalamin 500 mcg, vitamin B-12 (CYANOCOBALAMIN) 2,000 mcg, 2,500 mcg, Oral, Daily,  Liam Graham, PA, 2,500 mcg at 09/14/11 1610;  diltiazem (DILACOR XR) 24 hr capsule 240 mg, 240 mg, Oral, QHS, Zannie Cove, MD, 240 mg at 09/14/11 2206 diphenhydrAMINE (BENADRYL) 12.5 MG/5ML elixir 12.5-25 mg, 12.5-25 mg, Oral, Q4H PRN, Liam Graham, PA, 25 mg at 09/10/11 0330;  docusate sodium (COLACE) capsule 100 mg, 100 mg, Oral, BID, Liam Graham, PA, 100 mg at 09/14/11 2205;  fluticasone (FLONASE) 50 MCG/ACT nasal spray 1 spray, 1 spray, Each Nare, Daily, Zannie Cove, MD;  gabapentin (NEURONTIN) capsule 100 mg, 100 mg, Oral, QHS, Alinda Money, MD, 100 mg at 09/14/11 2206 HYDROmorphone (DILAUDID) injection 0.5-1 mg, 0.5-1 mg, Intravenous, Q2H PRN, Liam Graham, PA, 1 mg at 09/10/11 0327;  loratadine (CLARITIN) tablet 10 mg, 10 mg, Oral, Daily, Liam Graham, PA, 10 mg at 09/14/11 9604;  magnesium chloride (SLOW-MAG) 64 MG SR tablet 128 mg, 2 tablet, Oral, QHS, Javier Docker, MD, 128 mg at 09/14/11 2226 magnesium chloride (SLOW-MAG) 64 MG SR tablet 64 mg, 1 tablet, Oral, Daily, Javier Docker, MD, 64 mg at 09/14/11 1416;  menthol-cetylpyridinium (CEPACOL) lozenge 3 mg, 1 lozenge, Oral, PRN, Liam Graham, PA;  methocarbamol (ROBAXIN) 500 mg in dextrose 5 % 50 mL IVPB, 500 mg, Intravenous, Q6H PRN, Liam Graham, PA, 500 mg at 09/10/11 0150 methocarbamol (ROBAXIN) tablet 500 mg, 500 mg, Oral, Q6H PRN, Liam Graham, PA, 500 mg at 09/15/11 0402;  metoCLOPramide (REGLAN) injection 5-10 mg, 5-10 mg, Intravenous, Q8H PRN, Liam Graham, PA;  metoCLOPramide (REGLAN) tablet 5-10 mg, 5-10 mg, Oral, Q8H PRN, Liam Graham, PA;  niacin tablet 500 mg, 500 mg, Oral, Q breakfast, Liam Graham, PA, 500 mg at 09/12/11 1049 ondansetron (ZOFRAN) injection 4 mg, 4 mg, Intravenous, Q6H PRN, Liam Graham, PA;  ondansetron (ZOFRAN) tablet 4 mg, 4 mg, Oral, Q6H PRN, Liam Graham, PA;  oxyCODONE (Oxy IR/ROXICODONE)  immediate release tablet 5 mg, 5 mg, Oral, Q3H PRN, Liam Graham, PA, 5 mg at 09/15/11 0455;  pantoprazole (PROTONIX) EC tablet 80 mg, 80 mg, Oral, Q1200, Liam Graham, PA, 80 mg at 09/15/11 0802 phenol (CHLORASEPTIC) mouth spray 1 spray, 1 spray, Mouth/Throat, PRN, Liam Graham, PA;  polyethylene glycol (MIRALAX / GLYCOLAX) packet 17 g, 17 g, Oral, Daily PRN, Liam Graham, PA, 17 g at 09/12/11 2220;  polyvinyl alcohol (  LIQUIFILM TEARS) 1.4 % ophthalmic solution 1 drop, 1 drop, Both Eyes, TID, Javier Docker, MD, 1 drop at 09/13/11 2200 potassium chloride SA (K-DUR,KLOR-CON) CR tablet 20 mEq, 20 mEq, Oral, BID, Liam Graham, PA, 20 mEq at 09/14/11 2206;  warfarin (COUMADIN) tablet 5 mg, 5 mg, Oral, ONCE-1800, Maryanna Shape Runyon, PHARMD, 5 mg at 09/14/11 1803;  Warfarin - Pharmacist Dosing Inpatient, , Does not apply, q1800, Javier Docker, MD;  DISCONTD: enoxaparin (LOVENOX) injection 30 mg, 30 mg, Subcutaneous, Q12H, Liam Graham, PA, 30 mg at 09/14/11 1610 DISCONTD: quiNINE (QUALAQUIN) capsule 324 mg, 324 mg, Oral, QHS PRN, Liam Graham, PA, 324 mg at 09/13/11 2050  Patients Current Diet: General  Precautions / Restrictions Precautions Precautions: Knee Restrictions Weight Bearing Restrictions: No Other Position/Activity Restrictions: L WBAT   Prior Activity Level Community (5-7x/wk): independent PTA for elective surgery Home Assistive Devices / Equipment Home Assistive Devices/Equipment: CPAP;Nebulizer;Cane (specify quad or straight);Walker (specify type);Grab bars around toilet;Grab bars in shower;Eyeglasses;Other (Comment) (braces upper/lower) Home Adaptive Equipment: Bedside commode/3-in-1;Reacher;Walker - rolling;Other (comment) (pt states has 3:1 that she uses in tub)  Prior Functional Level Prior Function Level of Independence: Independent with assistive device(s) Able to Take Stairs?: Yes Driving: Yes Vocation: Other  (comment) Comments: works from home  Current Functional Level Cognition  Arousal/Alertness: Awake/alert Overall Cognitive Status: Appears within functional limits for tasks assessed/performed Orientation Level: Oriented X4 Cognition - Other Comments: Lethergy ? related to meds; Pt BP also low 86/60 noted    Extremity Assessment (includes Sensation/Coordination)  RUE ROM/Strength/Tone: Within functional levels RUE Sensation: WFL - Light Touch  RLE ROM/Strength/Tone: Deficits RLE ROM/Strength/Tone Deficits: knee flex ltd to 60 by pain; hip flex to 90    ADLs  Eating/Feeding: Performed;Modified independent Where Assessed - Eating/Feeding: Chair Grooming: Simulated;Modified independent Where Assessed - Grooming: Supported sitting Upper Body Bathing: Simulated;Set up;Minimal assistance Where Assessed - Upper Body Bathing: Supported sitting Lower Body Bathing: Simulated;Maximal assistance;+1 Total assistance Where Assessed - Lower Body Bathing: Supported sitting Upper Body Dressing: Performed;Minimal assistance Where Assessed - Upper Body Dressing: Unsupported sitting;Other (comment) (EOB) Lower Body Dressing: Performed;Maximal assistance Where Assessed - Lower Body Dressing: Supine, head of bed up Toilet Transfer: Simulated;+2 Total assistance Toilet Transfer Method: Sit to stand;Stand pivot Toilet Transfer Equipment: Extra wide bedside commode;Other (comment) (RW; VC's for safety and sequencing) Toileting - Clothing Manipulation and Hygiene: Simulated;Maximal assistance Where Assessed - Toileting Clothing Manipulation and Hygiene: Sit to stand from 3-in-1 or toilet;Standing Tub/Shower Transfer Method: Not assessed Equipment Used: Rolling walker;Other (comment) (L KI) Transfers/Ambulation Related to ADLs: Pt very lethargic during transfers and amb related to ADL's; req +2 assist w/ pt 60-70% and consistent VC's for safety and sequencing ADL Comments: Pt is lethargic during  asssessment. Overall req +2 total assist (pt 60-70%) for sit to stand from chair level, and LB ADL's & selfcare tasks. She requires consistent VC's for safety and sequencing & will benefit from OT acutely followed by SNF Rehab secondary to lives alone.    Mobility  Bed Mobility: Sit to Supine Supine to Sit: 4: Min assist;3: Mod assist Supine to Sit: Patient Percentage: 50% Sit to Supine: 4: Min assist    Transfers  Transfers: Sit to Stand;Stand to Sit Sit to Stand: 4: Min assist Sit to Stand: Patient Percentage:  (Pt 60-70 % w/ increased time & VC's safety) Stand to Sit: 4: Min assist Stand to Sit: Patient Percentage: 60%    Ambulation / Gait / Stairs / Psychologist, prison and probation services  Ambulation/Gait Ambulation/Gait Assistance: 4: Min assist Ambulation/Gait: Patient Percentage: 70% Ambulation Distance (Feet): 75 Feet (x2) Assistive device: Rolling walker Ambulation/Gait Assistance Details: min cues for posture and pacing Gait Pattern: Step-to pattern    Posture / Balance       Previous Home Environment Living Arrangements: Alone Lives With: Alone Type of Home: Apartment Home Layout: One level Home Access: Level entry Bathroom Shower/Tub: Engineer, manufacturing systems: Handicapped height Bathroom Accessibility: Yes How Accessible: Accessible via walker Home Care Services: No Additional Comments: Pt reports that she uses 3:1 in tub  Discharge Living Setting Plans for Discharge Living Setting: Patient's home;Apartment;Alone Type of Home at Discharge: Apartment Discharge Home Layout: One level Discharge Home Access: Level entry Discharge Bathroom Shower/Tub: Tub/shower unit Discharge Bathroom Toilet: Handicapped height Discharge Bathroom Accessibility: Yes How Accessible: Accessible via walker Do you have any problems obtaining your medications?: No  Social/Family/Support Systems Patient Roles:  (employee) Contact Information: home [phone] Anticipated Caregiver: sister  will be coming into town on June 3 Ability/Limitations of Caregiver: none Caregiver Availability: 24/7 Discharge Plan Discussed with Primary Caregiver:  (Did not speak to her sister who is coming into town to help ) Is Caregiver In Agreement with Plan?:  (patient states, her sister coming to help) Does Caregiver/Family have Issues with Lodging/Transportation while Pt is in Rehab?: No  Goals/Additional Needs Patient/Family Goal for Rehab: Supervision PT/PT Expected length of stay: 1week approved from insurance Cultural Considerations: none Dietary Needs: Regular Equipment Needs: to be determined Pt/Family Agrees to Admission and willing to participate: Yes Program Orientation Provided & Reviewed with Pt/Caregiver Including Roles  & Responsibilities: Yes  Patient Condition: Please see physician update to information in consult dated 09/10/11 1628.  Preadmission Screen Completed By:  Oletta Darter, 09/15/2011 9:25 AM ______________________________________________________________________   Discussed status with Dr. Riley Kill on 09/15/11 at 0800 and received telephone approval for admission today.  Admission Coordinator:  Oletta Darter, [id number] amd /Date5/28/13

## 2011-09-15 NOTE — H&P (Signed)
Physical Medicine and Rehabilitation Admission H&P  CC: Endstage DJD L-Knee, morbid obesity  HPI: [name] is an 51 y.o. Female with history of morbid obesity, OSA, endstage DJD left knee, who elected to undergo L-TKR on 05/23 by Dr Shelle Iron. Post op WBAT and chronic coumadin resumed. Post op reported peri-oral and bilateral hand numbness with hypotension-BP 86/60. Hospitalist consulted and recommended transfusion with 1 unit PRBC for ABLA and felt numbness due to history of cervical myelopathy v/s fitting of CPAP mask. B 12 levels normal a>2000. Was transfused with additional unit yesterday and Hgb @7 .1 today.  Review of Systems  HENT: Negative for hearing loss and neck pain.  Eyes: Negative for blurred vision and double vision.  Respiratory: Negative for cough, sputum production and shortness of breath.  Cardiovascular: Negative for chest pain and palpitations.  Gastrointestinal: Positive for constipation. Negative for heartburn and nausea.  Genitourinary: Negative for urgency and frequency.  Musculoskeletal: Positive for joint pain. Negative for myalgias.  Neurological: Negative for sensory change, speech change and headaches.   Past Medical History   Diagnosis  Date   .  Hypertension    .  Arthritis    .  Asthma    .  Morbid obesity    .  PE (pulmonary embolism)    .  DVT (deep venous thrombosis)    .  GERD (gastroesophageal reflux disease)    .  Dysfunctional uterine bleeding    .  Headache      MIGRAINES IN PAST   .  Sleep apnea, obstructive      USES C-PAP   .  Glaucoma     Past Surgical History   Procedure  Date   .  Posterior laminectomy / decompression cervical spine  01/26/2011   .  Glaucoma surgery  10/31/2009   .  Gastric bypass  [id number]   .  Knee arthroscopy  12/2007   .  Dilation and curettage of uterus    .  Hysteroscopy      X2   .  Eye surgery  11/16/2009     FOR GLAUCOMA AND LASER SURGERY X2   .  Cataract surg    .  Ivc filter      2010 - THEN  REMOVED THEN PLACED AGAIN 2012   .  Total knee arthroplasty  09/09/2011     Procedure: TOTAL KNEE ARTHROPLASTY; Surgeon: Javier Docker, MD; Location: [facility]; Service: Orthopedics; Laterality: Left;    Family History   Problem  Relation  Age of Onset   .  Glaucoma     .  Cerebral aneurysm     .  Stroke     .  Arthritis  Mother    .  Hypertension  Mother    .  Diabetes  Mother    .  Arthritis  Father    .  Diabetes  Father    .  Alcohol abuse  Father     Social History: reports that she has never smoked. She has never used smokeless tobacco. She reports that she does not drink alcohol or use illicit drugs.  Allergies   Allergen  Reactions   .  Beta Adrenergic Blockers  Other (See Comments)     RED EYES AND CONGESTION   .  Penicillins  Hives, Itching and Swelling   .  Sulfa Antibiotics  Hives     RED EYES   .  Thimerosal  Hives and Itching    Scheduled Meds:  .  bimatoprost  1 drop  Left Eye  QHS   .  brimonidine  1 drop  Both Eyes  TID   .  brinzolamide  1 drop  Left Eye  TID   .  cyanocobalamin  2,500 mcg  Oral  Daily   .  diltiazem  240 mg  Oral  QHS   .  docusate sodium  100 mg  Oral  BID   .  fluticasone  1 spray  Each Nare  Daily   .  gabapentin  100 mg  Oral  QHS   .  loratadine  10 mg  Oral  Daily   .  magnesium chloride  2 tablet  Oral  QHS   .  magnesium chloride  1 tablet  Oral  Daily   .  niacin  500 mg  Oral  Q breakfast   .  pantoprazole  80 mg  Oral  Q1200   .  polyvinyl alcohol  1 drop  Both Eyes  TID   .  potassium chloride SA  20 mEq  Oral  BID   .  warfarin  5 mg  Oral  ONCE-1800   .  Warfarin - Pharmacist Dosing Inpatient   Does not apply  q1800   .  DISCONTD: enoxaparin  30 mg  Subcutaneous  Q12H     Medications Prior to Admission   Medication  Sig  Dispense  Refill   .  b complex vitamins tablet  Take 1 tablet by mouth daily.     .  bimatoprost (LUMIGAN) 0.01 % SOLN  Place 1 drop into the left eye at bedtime.     .  brimonidine (ALPHAGAN) 0.2 %  ophthalmic solution  Place 1 drop into both eyes 3 (three) times daily.     .  brinzolamide (AZOPT) 1 % ophthalmic suspension  Place 1 drop into the left eye 3 (three) times daily.     .  cyclobenzaprine (FLEXERIL) 10 MG tablet  Take 10 mg by mouth at bedtime as needed. FOR MUSCLE SPASMS     .  diltiazem (DILACOR XR) 240 MG 24 hr capsule  Take 240 mg by mouth at bedtime.     Marland Kitchen  esomeprazole (NEXIUM) 40 MG capsule  Take 40 mg by mouth daily before breakfast.     .  fexofenadine (ALLEGRA) 180 MG tablet  Take 180 mg by mouth daily with breakfast.     .  gabapentin (NEURONTIN) 100 MG capsule  Take 100 mg by mouth 3 (three) times daily.     .  magnesium chloride (SLOW-MAG) 64 MG TBEC  Take 1-2 tablets by mouth 2 (two) times daily. Patient takes 1 tablet every morning and 2 tablets by mouth every evening     .  methocarbamol (ROBAXIN) 500 MG tablet  Take 500 mg by mouth 3 (three) times daily as needed. For pain     .  potassium chloride SA (K-DUR,KLOR-CON) 20 MEQ tablet  Take 20 mEq by mouth 2 (two) times daily. Two in am and 1 at dinner     .  PRESCRIPTION MEDICATION  Take 1 puff by mouth 2 (two) times daily. [place] inhaler     .  PRESCRIPTION MEDICATION  Take by mouth at bedtime. Dilaresp=allergy medication     .  Probiotic Product (PROBIOTIC FORMULA PO)  Take 1 tablet by mouth daily.     Marland Kitchen  Propylene Glycol (SYSTANE BALANCE) 0.6 % SOLN  Apply 1 drop to eye  3 (three) times daily. In right eye     .  quiNINE (QUALAQUIN) 324 MG capsule  Take 324 mg by mouth at bedtime as needed.     .  valsartan-hydrochlorothiazide (DIOVAN-HCT) 80-12.5 MG per tablet  Take 1 tablet by mouth daily with breakfast.     .  DISCONTD: traMADol (ULTRAM) 50 MG tablet  Take 50 mg by mouth every 6 (six) hours as needed. For pain     .  cyanocobalamin 1000 MCG tablet  Take 2,500 mcg by mouth daily.     .  fish oil-omega-3 fatty acids 1000 MG capsule  Take 2 g by mouth daily.     .  Multiple Vitamin (MULTIVITAMIN) tablet  Take 2  tablets by mouth at bedtime.     .  niacin 500 MG tablet  Take 500 mg by mouth daily with breakfast.     .  warfarin (COUMADIN) 10 MG tablet  Take 5-10 mg by mouth every evening. 10mg  3days per week, 5mg  for 4 days per week     .  DISCONTD: celecoxib (CELEBREX) 100 MG capsule  Take 200 mg by mouth daily with breakfast.     .  DISCONTD: diclofenac sodium (VOLTAREN) 1 % GEL  Apply 1 application topically 4 (four) times daily as needed. For knee pain      Home:  Home Living  Lives With: Alone  Available Help at Discharge: Other (Comment) (Friend PRN cleaning)  Type of Home: Apartment  Home Access: Level entry  Home Layout: One level  Bathroom Shower/Tub: Medical sales representative: Handicapped height (pt reports raised toilet seat)  Bathroom Accessibility: Yes  How Accessible: Accessible via walker  Home Adaptive Equipment: Bedside commode/3-in-1;Reacher;Walker - rolling;Other (comment) (pt states has 3:1 that she uses in tub)  Additional Comments: Pt reports that she uses 3:1 in tub  Functional History:  Prior Function  Driving: Yes  Vocation: Other (comment) (Works from [employer])  Functional Status:  Mobility:  Bed Mobility  Bed Mobility: Sit to Supine  Supine to Sit: 4: Min assist;3: Mod assist  Supine to Sit: Patient Percentage: 50%  Sit to Supine: 4: Min assist  Transfers  Transfers: Sit to Stand;Stand to Sit  Sit to Stand: 4: Min assist  Sit to Stand: Patient Percentage: (Pt 60-70 % w/ increased time & VC's safety)  Stand to Sit: 4: Min assist  Stand to Sit: Patient Percentage: 60%  Ambulation/Gait  Ambulation/Gait Assistance: 4: Min assist  Ambulation/Gait: Patient Percentage: 70%  Ambulation Distance (Feet): 75 Feet (x2)  Assistive device: Rolling walker  Ambulation/Gait Assistance Details: min cues for posture and pacing  Gait Pattern: Step-to pattern   ADL:  ADL  Eating/Feeding: Performed;Modified independent  Where Assessed - Eating/Feeding: Chair    Grooming: Simulated;Modified independent  Where Assessed - Grooming: Supported sitting  Upper Body Bathing: Simulated;Set up;Minimal assistance  Where Assessed - Upper Body Bathing: Supported sitting  Lower Body Bathing: Simulated;Maximal assistance;+1 Total assistance  Where Assessed - Lower Body Bathing: Supported sitting  Upper Body Dressing: Performed;Minimal assistance  Where Assessed - Upper Body Dressing: Unsupported sitting;Other (comment) (EOB)  Lower Body Dressing: Performed;Maximal assistance  Where Assessed - Lower Body Dressing: Supine, head of bed up  Toilet Transfer: Simulated;+2 Total assistance  Toilet Transfer Method: Sit to stand;Stand pivot  Toilet Transfer Equipment: Extra wide bedside commode;Other (comment) (RW; VC's for safety and sequencing)  Tub/Shower Transfer Method: Not assessed  Equipment Used: Rolling walker;Other (comment) (L KI)  Transfers/Ambulation Related to ADLs: Pt very lethargic during transfers and amb related to ADL's; req +2 assist w/ pt 60-70% and consistent VC's for safety and sequencing  ADL Comments: Pt is lethargic during asssessment. Overall req +2 total assist (pt 60-70%) for sit to stand from chair level, and LB ADL's & selfcare tasks. She requires consistent VC's for safety and sequencing & will benefit from OT acutely followed by SNF Rehab secondary to lives alone.  Cognition:  Cognition  Arousal/Alertness: Awake/alert  Orientation Level: Oriented X4  Cognition  Overall Cognitive Status: Appears within functional limits for tasks assessed/performed  Arousal/Alertness: Awake/alert  Orientation Level: Appears intact for tasks assessed  Behavior During Session: [facility] for tasks performed  Cognition - Other Comments: Lethergy ? related to meds; Pt BP also low 86/60 noted  Blood pressure 128/84, pulse 88, temperature 98.5 F (36.9 C), temperature source Oral, resp. rate 16, height 5\' 4"  (1.626 m), weight 124.739 kg (275 lb), last menstrual  period 05/28/2011, SpO2 100.00%.  Physical Exam  Nursing note and vitals reviewed.  Constitutional: She is oriented to person, place, and time. She appears well-developed and well-nourished.  obese  HENT:  Head: Normocephalic and atraumatic.  Eyes: Pupils are equal, round, and reactive to light. Extraocular eye movements are intact. Neck: Normal range of motion. Neck supple.  Cardiovascular: Normal rate and regular rhythm. No murmurs rubs or gallop Pulmonary/Chest: Effort normal and breath sounds normal. No wheezes rales or rhonchi Abdominal: Soft. Bowel sounds are normal.  Musculoskeletal: She exhibits edema.  Minimal edema with ecchymosis left knee. Incision clean and dry with staples in place. Knee range of motion approximately 50-60 of passive movements. Left knee appropriately tender. Strength in the upper extremities grossly 4-5 out of 5. Right lower extremities 4-5 out of 5. Left lower extremity is 1-2 proximally because of pain a 4/5 distally. No gross sensory abnormalities were appreciated today.  Neurological: She is alert and oriented to person, place, and time.  Skin: Skin is warm.  Psychiatric: She has a normal mood and affect. Her behavior is normal. Judgment and thought content normal.   Recent Labs:  PROTIME-INR Status: Abnormal    Collection Time    09/15/11 4:17 AM   Component  Value  Range  Comment    Prothrombin Time  26.8 (*)  11.6 - 15.2 (seconds)     INR  2.43 (*)  0.00 - 1.49    CBC Status: Abnormal    Collection Time    09/15/11 4:17 AM   Component  Value  Range  Comment    WBC  7.1  4.0 - 10.5 (K/uL)     RBC  3.26 (*)  3.87 - 5.11 (MIL/uL)     Hemoglobin  8.9 (*)  12.0 - 15.0 (g/dL)     HCT  16.1 (*)  09.6 - 46.0 (%)     MCV  79.8  78.0 - 100.0 (fL)     MCH  27.3  26.0 - 34.0 (pg)     MCHC  34.2  30.0 - 36.0 (g/dL)     RDW  04.5  40.9 - 15.5 (%)     Platelets  143 (*)  150 - 400 (K/uL)    No results found.  Post Admission Physician Evaluation:   1. Functional deficits secondary to left TKA with a hx of cervical myelopathy. 2. Patient is admitted to receive collaborative, interdisciplinary care between the physiatrist, rehab nursing staff, and therapy team. 3. Patient's level of medical complexity  and substantial therapy needs in context of that medical necessity cannot be provided at a lesser intensity of care such as a SNF. 4. Patient has experienced substantial functional loss from his/her baseline which was documented above under the "Functional History" and "Functional Status" headings. Judging by the patient's diagnosis, physical exam, and functional history, the patient has potential for functional progress which will result in measurable gains while on inpatient rehab. These gains will be of substantial and practical use upon discharge in facilitating mobility and self-care at the household level. 5. Physiatrist will provide 24 hour management of medical needs as well as oversight of the therapy plan/treatment and provide guidance as appropriate regarding the interaction of the two. 6. 24 hour rehab nursing will assist with bladder management, bowel management, safety, skin/wound care, disease management, medication administration, pain management and patient education and help integrate therapy concepts, techniques,education, etc. 7. PT will assess and treat for: Lower extremity strength, range of motion, safety, adaptive equipment training, pain management and functional mobility. Goals are: Modified independent to supervision. 8. OT will assess and treat for: Upper extremity strength, range of motion, safety, functional mobility, ADLs and pain management. Goals are: Modified independent to set up. 9. SLP will assess and treat for: Not applicable 10. Case Management and Social Worker will assess and treat for psychological issues and discharge planning. 11. Team conference will be held weekly to assess progress toward goals and to  determine barriers to discharge. 12. Patient will receive at least 3 hours of therapy per day at least 5 days per week. 13. ELOS and Prognosis: One week excellent Medical Problem List and Plan:  1. DVT Prophylaxis/Anticoagulation: Pharmaceutical: Coumadin  2. Pain Management: Will start patient on low dose long acting OxyContin for more consistent pain relief as requiring pain medication through the night. May need premedication prior to therapies.  3. Mood: Motivated to get better. Will have SW to follow up for formal evaluation.  4. ABLA: Hgb with some improvement past transfusion. Will monitor for recurrent hypotension. Check stool guaiacs to rule out GIB as therapeutic on chronic coumadin. Tachycardia noted. Add iron supplement  5. OSA: Continue to encourage CPAP use when sleeping  6. Morbid obesity: Pressure relief measures. Patient adheres to CM diet to help with weight control.  7. HTN: On cardizem at HS. May need to hold BP medications if patient orthostatic. Continue to hold diovan HCTZ.  8. GERD: continue protonix  Ranelle Oyster M.D. 09/15/2011

## 2011-09-15 NOTE — Progress Notes (Signed)
ANTICOAGULATION CONSULT NOTE - Follow-Up  Pharmacy Consult for Coumadin  Indication: Hx of DVT, PE;    S/P L TKA  Allergies  Allergen Reactions  . Beta Adrenergic Blockers Other (See Comments)    RED EYES AND CONGESTION  . Penicillins Hives, Itching and Swelling  . Sulfa Antibiotics Hives    RED EYES  . Thimerosal Hives and Itching   Patient Measurements: Height: 5\' 4"  (162.6 cm) Weight: 275 lb (124.739 kg) IBW/kg (Calculated) : 54.7   Vital Signs: Temp: 98.5 F (36.9 C) (05/28 0433) Temp src: Oral (05/28 0433) BP: 128/84 mmHg (05/28 0433) Pulse Rate: 88  (05/28 0433)  Labs:  Basename 09/15/11 0417 09/14/11 0416 09/13/11 1030 09/13/11 0413  HGB 8.9* 7.5* -- --  HCT 26.0* 22.0* 24.9* --  PLT 143* 126* 114* --  APTT -- -- -- --  LABPROT 26.8* 25.5* -- 21.8*  INR 2.43* 2.28* -- 1.86*  HEPARINUNFRC -- -- -- --  CREATININE -- 1.01 0.94 --  CKTOTAL -- -- -- --  CKMB -- -- -- --  TROPONINI -- -- -- --   Estimated Creatinine Clearance: 85.1 ml/min (by C-G formula based on Cr of 1.01).  Medical History: Past Medical History  Diagnosis Date  . Hypertension   . Arthritis   . Asthma   . Morbid obesity   . PE (pulmonary embolism)   . DVT (deep venous thrombosis)   . GERD (gastroesophageal reflux disease)   . Dysfunctional uterine bleeding   . Headache     MIGRAINES IN PAST  . Sleep apnea, obstructive     USES C-PAP  . Glaucoma    Inpatient warfarin doses this admission: 5/22 - 5/27: 10, 10, 10, 10, 7.5, 5 mg.  Assessment:  [name] on chronic Coumadin for hx PE/DVT and resumed Coumadin post L TKA.  Home warfarin dosage reported as 10 mg 3 days per week and 5 mg 4 days per week.   INR therapeutic.  Plans for discharge to SNF today noted.  Goal of Therapy:  INR 2-3   Plan:   When discharged to SNF, agree with resuming home warfarin dosage (10mg  on three days a week, 5mg  on four days a week).  Given current INR level, suggest a warfarin schedule of 10mg   on Tues, Thurs, Sat; 5mg  on Mon, Wed, Fri, Sun. Needs close INR F/U at [facility] - recommend rechecking INR no later than Thurs 09/17/11, then at least twice weekly until stable, then at least once weekly.  Hope Budds PharmD, BCPS 9:44 AM 09/15/2011

## 2011-09-15 NOTE — Progress Notes (Signed)
ANTICOAGULATION CONSULT NOTE - Follow Up Consult  Pharmacy Consult for coumadin Indication: PMH = DVT/PE, now s/p L TKA 5/23  Allergies  Allergen Reactions  . Beta Adrenergic Blockers Other (See Comments)    RED EYES AND CONGESTION  . Penicillins Hives, Itching and Swelling  . Sulfa Antibiotics Hives    RED EYES  . Thimerosal Hives and Itching    Patient Measurements: Height: 5\' 4"  (162.6 cm) Weight: 275 lb 8 oz (124.966 kg) IBW/kg (Calculated) : 54.7  Heparin Dosing Weight:   Vital Signs: Temp: 98.1 F (36.7 C) (05/28 1245) Temp src: Oral (05/28 1245) BP: 144/80 mmHg (05/28 1245) Pulse Rate: 95  (05/28 1245)  Labs:  Basename 09/15/11 0417 09/14/11 0416 09/13/11 1030 09/13/11 0413  HGB 8.9* 7.5* -- --  HCT 26.0* 22.0* 24.9* --  PLT 143* 126* 114* --  APTT -- -- -- --  LABPROT 26.8* 25.5* -- 21.8*  INR 2.43* 2.28* -- 1.86*  HEPARINUNFRC -- -- -- --  CREATININE -- 1.01 0.94 --  CKTOTAL -- -- -- --  CKMB -- -- -- --  TROPONINI -- -- -- --    Estimated Creatinine Clearance: 85.2 ml/min (by C-G formula based on Cr of 1.01).  Medications:  Scheduled:    . bimatoprost  1 drop Left Eye QHS  . brimonidine  1 drop Both Eyes TID  . brinzolamide  1 drop Left Eye TID  . cyanocobalamin  2,500 mcg Oral Daily  . diltiazem  240 mg Oral QHS  . docusate sodium  100 mg Oral BID  . fluticasone  1 spray Each Nare Daily  . gabapentin  100 mg Oral QHS  . iron polysaccharides  150 mg Oral q12n4p  . loratadine  10 mg Oral Daily  . magnesium chloride  2 tablet Oral QHS  . magnesium chloride  1 tablet Oral Daily  . niacin  500 mg Oral Q breakfast  . oxyCODONE  10 mg Oral Q12H  . pantoprazole  80 mg Oral Q1200  . polyvinyl alcohol  1 drop Both Eyes TID  . Warfarin - Pharmacist Dosing Inpatient   Does not apply q1800    Assessment: 50 YOF transferring from [facility]/28 for rehab s/p TKA on 5/23.  INR is therapeutic today. She was on coumadin prior to surgery for h/o DVT and PE  (home dose of coumadin 10mg  TIW and 5mg  4x/week).  Hgb=8.9 and platelets = 143 (improving).  No bleeding noted.   Goal of Therapy:  INR 2-3 Monitor platelets by anticoagulation protocol: Yes   Plan:  1. Attempt to resume home regimen: 10mg  TTSa, 5mg  MWFsu 2. Daily INR for now  Dannielle Huh 09/15/2011,1:43 PM

## 2011-09-15 NOTE — Progress Notes (Signed)
I have insurance approval this a.m.for CIR. Will inform charge RN and RNCM. Thanks everyone for your patience during insurance's decision over the holiday. Toni Amend adm coordinator [phone].

## 2011-09-15 NOTE — Progress Notes (Signed)
Pt refused to take Niacin states that it causes burning sensation all over. C.Strader,PA notified of pt's refusal to take med

## 2011-09-16 DIAGNOSIS — M1710 Unilateral primary osteoarthritis, unspecified knee: Secondary | ICD-10-CM

## 2011-09-16 DIAGNOSIS — K922 Gastrointestinal hemorrhage, unspecified: Secondary | ICD-10-CM

## 2011-09-16 DIAGNOSIS — Z96659 Presence of unspecified artificial knee joint: Secondary | ICD-10-CM

## 2011-09-16 LAB — CBC
HCT: 25.1 % — ABNORMAL LOW (ref 36.0–46.0)
Hemoglobin: 8.5 g/dL — ABNORMAL LOW (ref 12.0–15.0)
MCH: 26.9 pg (ref 26.0–34.0)
MCHC: 33.9 g/dL (ref 30.0–36.0)
MCV: 79.4 fL (ref 78.0–100.0)
Platelets: 176 10*3/uL (ref 150–400)
RBC: 3.16 MIL/uL — ABNORMAL LOW (ref 3.87–5.11)
RDW: 15.1 % (ref 11.5–15.5)
WBC: 7.4 10*3/uL (ref 4.0–10.5)

## 2011-09-16 LAB — COMPREHENSIVE METABOLIC PANEL
ALT: 18 U/L (ref 0–35)
AST: 17 U/L (ref 0–37)
Albumin: 2.3 g/dL — ABNORMAL LOW (ref 3.5–5.2)
Alkaline Phosphatase: 51 U/L (ref 39–117)
BUN: 10 mg/dL (ref 6–23)
CO2: 22 mEq/L (ref 19–32)
Calcium: 8.9 mg/dL (ref 8.4–10.5)
Chloride: 103 mEq/L (ref 96–112)
Creatinine, Ser: 0.92 mg/dL (ref 0.50–1.10)
GFR calc Af Amer: 82 mL/min — ABNORMAL LOW (ref >90)
GFR calc non Af Amer: 70 mL/min — ABNORMAL LOW (ref >90)
Glucose, Bld: 93 mg/dL (ref 70–99)
Potassium: 4 mEq/L (ref 3.5–5.1)
Sodium: 138 mEq/L (ref 135–145)
Total Bilirubin: 0.4 mg/dL (ref 0.3–1.2)
Total Protein: 5.8 g/dL — ABNORMAL LOW (ref 6.0–8.3)

## 2011-09-16 LAB — PROTIME-INR
INR: 2.75 — ABNORMAL HIGH (ref 0.00–1.49)
Prothrombin Time: 29.5 seconds — ABNORMAL HIGH (ref 11.6–15.2)

## 2011-09-16 LAB — DIFFERENTIAL
Basophils Absolute: 0.1 10*3/uL (ref 0.0–0.1)
Basophils Relative: 1 % (ref 0–1)
Eosinophils Absolute: 0.4 10*3/uL (ref 0.0–0.7)
Eosinophils Relative: 5 % (ref 0–5)
Lymphocytes Relative: 24 % (ref 12–46)
Lymphs Abs: 1.8 10*3/uL (ref 0.7–4.0)
Monocytes Absolute: 0.7 10*3/uL (ref 0.1–1.0)
Monocytes Relative: 10 % (ref 3–12)
Neutro Abs: 4.4 10*3/uL (ref 1.7–7.7)
Neutrophils Relative %: 60 % (ref 43–77)

## 2011-09-16 MED ORDER — MAGNESIUM CHLORIDE 64 MG PO TBEC
1.0000 | DELAYED_RELEASE_TABLET | Freq: Every day | ORAL | Status: DC
  Administered 2011-09-16 – 2011-09-21 (×6): 64 mg via ORAL
  Filled 2011-09-16 (×7): qty 1

## 2011-09-16 MED ORDER — BENEPROTEIN PO POWD
1.0000 | Freq: Three times a day (TID) | ORAL | Status: DC
  Administered 2011-09-16 – 2011-09-22 (×14): 6 g via ORAL
  Filled 2011-09-16 (×2): qty 227

## 2011-09-16 MED ORDER — POTASSIUM CHLORIDE CRYS ER 20 MEQ PO TBCR
20.0000 meq | EXTENDED_RELEASE_TABLET | Freq: Every day | ORAL | Status: DC
  Filled 2011-09-16: qty 1

## 2011-09-16 MED ORDER — QUINIDINE SULFATE 300 MG PO TABS: 300.0000 mg | ORAL_TABLET | Freq: Every day | ORAL | Status: DC

## 2011-09-16 MED ORDER — MAGNESIUM CHLORIDE 64 MG PO TBEC
2.0000 | DELAYED_RELEASE_TABLET | Freq: Every day | ORAL | Status: DC
  Administered 2011-09-16 – 2011-09-22 (×7): 128 mg via ORAL
  Filled 2011-09-16 (×9): qty 2

## 2011-09-16 MED ORDER — POTASSIUM CHLORIDE CRYS ER 20 MEQ PO TBCR
40.0000 meq | EXTENDED_RELEASE_TABLET | Freq: Every day | ORAL | Status: DC
  Filled 2011-09-16 (×2): qty 2

## 2011-09-16 MED ORDER — QUININE SULFATE 324 MG PO CAPS
324.0000 mg | ORAL_CAPSULE | Freq: Every day | ORAL | Status: DC
  Administered 2011-09-16 – 2011-09-21 (×6): 324 mg via ORAL
  Filled 2011-09-16 (×8): qty 1

## 2011-09-16 NOTE — Progress Notes (Signed)
Patient ID: [name], female   DOB: 26-Jul-1960, 51 y.o.   MRN: [id number]  i would be happy to see her here in the office.  If she's alluding to inpatient rehab after knee surgery, that is an option too. But we would have to consult on her post-op and decide if she needed rehab

## 2011-09-16 NOTE — Progress Notes (Signed)
Physical Therapy Assessment and Plan  Patient Details  Name: [name] MRN: [id number] Date of Birth: 04/29/1960  PT Diagnosis: Abnormality of gait, Difficulty walking, Muscle weakness and Pain in left knee Rehab Potential: Good ELOS: 7-10 days   Today's Date: 09/16/2011  Problem List:  Patient Active Problem List  Diagnoses  . HEPATITIS C  . HYPERTENSION  . C V A / STROKE  . ASTHMA  . SLEEP APNEA  . DYSPNEA  . Diabetes mellitus  . Arthritis  . Sleep apnea, obstructive  . Morbid obesity  . Dysfunctional uterine bleeding  . Cervical spondylosis with myelopathy  . Osteoarthritis of both knees  . Anemia  . ARF (acute renal failure)  . Paresthesias  . Total knee replacement status, left    Past Medical History:  Past Medical History  Diagnosis Date  . Hypertension   . Arthritis   . Asthma   . Morbid obesity   . PE (pulmonary embolism)   . DVT (deep venous thrombosis)   . GERD (gastroesophageal reflux disease)   . Dysfunctional uterine bleeding   . Headache     MIGRAINES IN PAST  . Sleep apnea, obstructive     USES C-PAP  . Glaucoma    Past Surgical History:  Past Surgical History  Procedure Date  . Posterior laminectomy / decompression cervical spine 01/26/2011  . Glaucoma surgery 10/31/2009  . Gastric bypass [id number]  . Knee arthroscopy 12/2007  . Dilation and curettage of uterus   . Hysteroscopy     X2  . Eye surgery 11/16/2009    FOR GLAUCOMA AND LASER SURGERY  X2  . Cataract surg   . Ivc filter     2010 - THEN REMOVED THEN PLACED AGAIN 2012  . Total knee arthroplasty 09/09/2011    Procedure: TOTAL KNEE ARTHROPLASTY;  Surgeon: Javier Docker, MD;  Location: [facility];  Service: Orthopedics;  Laterality: Left;    Assessment & Plan Clinical Impression:  Patient is a 51 y.o. Female with history of morbid obesity, OSA, endstage DJD left knee, who elected to undergo L-TKR on 05/23 by Dr Shelle Iron. Post op WBAT and chronic coumadin resumed. Post op  reported peri-oral and bilateral hand numbness with hypotension-BP 86/60. Hospitalist consulted and recommended transfusion with 1 unit PRBC for ABLA and felt numbness due to history of cervical myelopathy v/s fitting of CPAP mask.  Patient transferred to CIR on 09/15/2011 .   Patient currently requires min with mobility secondary to muscle weakness and muscle joint tightness and decreased cardiorespiratoy endurance.  Prior to hospitalization, patient was independent with mobility and lived with Alone in a Apartment home on the ground floor.  Patient describes home access to be 3 platform steps (curbs) to enter, then single level within home.  Patient reports that sister will be staying with her for ~6 weeks to assist following d/c.  Patient will benefit from skilled PT intervention to maximize safe functional mobility, minimize fall risk and decrease caregiver [place] for planned discharge home with intermittent assist.  Anticipate patient will benefit from follow up OP at discharge.  PT - End of Session Activity Tolerance: Decreased this session Endurance Deficit: Yes PT Assessment Rehab Potential: Good Barriers to Discharge: Inaccessible home environment PT Plan PT Frequency: 2-3 X/day, 60-90 minutes Estimated Length of Stay: 7-10 days PT Treatment/Interventions: Ambulation/gait training;Balance/vestibular training;Community reintegration;Disease management/prevention;Discharge planning;DME/adaptive equipment instruction;Functional mobility training;Pain management;Neuromuscular re-education;Patient/family education;Psychosocial support;Skin care/wound management;Splinting/orthotics;Stair training;Therapeutic Activities;Therapeutic Exercise;UE/LE Strength taining/ROM;UE/LE Coordination activities PT Recommendation Follow Up Recommendations: Outpatient  PT Equipment Recommended: None recommended by PT Equipment Details: Pt reports she has RW  PT  Evaluation Precautions/Restrictions Precautions Precautions: Fall Required Braces or Orthoses: Knee Immobilizer - Left Restrictions Weight Bearing Restrictions: Yes LLE Weight Bearing: Weight bearing as tolerated Home Living/Prior Functioning Home Living Lives With: Alone Available Help at Discharge: Family;Friend(s) (Sister will move in with her for ~6wks to assist) Type of Home: Apartment Home Access:  (3 platform steps/curb to enter) Home Layout: One level Home Adaptive Equipment: Bedside commode/3-in-1;Reacher;Walker - rolling;Other (comment) Prior Function Level of Independence: Independent with basic ADLs;Independent with homemaking with ambulation;Independent with gait;Independent with transfers;Requires assistive device for independence Able to Take Stairs?: Yes Driving: Yes Vision/Perception  Perception Perception: Within Functional Limits  Cognition Overall Cognitive Status: Appears within functional limits for tasks assessed Arousal/Alertness: Awake/alert Orientation Level: Oriented X4 Sensation Sensation Light Touch: Appears Intact Proprioception: Appears Intact Coordination Gross Motor Movements are Fluid and Coordinated: Yes (LLE limited d/t pain & weakness) Fine Motor Movements are Fluid and Coordinated: Yes Locomotion  Ambulation Ambulation/Gait Assistance: 4: Min assist with step to gait; decreased stance time LLE. Gait velocity=2.01 ft/sec Stairs / Additional Locomotion Stairs: Yes Number of Stairs: 5   Trunk/Postural Assessment  Lumbar Assessment Lumbar Assessment: Exceptions to WFL (flexed posture with gait in RW) Postural Control Postural Control: Within Functional Limits  Balance Balance Balance Assessed: Yes Static Sitting Balance: Mod I Dynamic Sitting Balance: SBA Static Standing Balance: Min A Dynamic Standing Balance: Min A  Extremity Assessment   RLE Assessment RLE Assessment: Within Functional Limits (AROM 9-110 deg limited d/t  adipose tissue) LLE Assessment LLE Assessment: Exceptions to WFL (AROM 21-55 deg knee flex; PROM 15-60 deg knee flex)  See FIM for current functional status Refer to Care Plan for Long Term Goals  Recommendations for other services: None  Discharge Criteria: Patient will be discharged from PT if patient refuses treatment 3 consecutive times without medical reason, if treatment goals not met, if there is a change in medical status, if patient makes no progress towards goals or if patient is discharged from hospital.  The above assessment, treatment plan, treatment alternatives and goals were discussed and mutually agreed upon: by patient  Skilled PT Tx Session 1: 1000-1100 (60 min)  Individual therapy session.  Pt reports "soreness" of 1/10 in L thigh; otherwise no c/o pain.  Tx focused on bed mobility, transfers, gait training, and stair training.  Pt completed all OOB activity with knee immobilizer- waiting to hear back from MD if pt needs to wear this OOB or if it is prescribed for comfort. Gait x 160' WBAT LLE with RW using step to gait pattern and limited stance time on LLE. Pt able to perform supine-->sit with supervision but requires assist with LLE for sit-->supine transfers.  Pt with limited knee flexion and extension ROM- See formal AROM/PROM measurements above.  Pt completed 5 steps with min assistance needed using both hand rails.  Pt educated on proper foot to lead with and was able to obey.  Discussed and introduced TKA therex program with patient before returning patient to bed.    Skilled PT Tx Session 2: 1430-1515 (45 min)  Individual therapy session.  Pt reports increased pain of R thigh and knee with session and that she did not receive her tylenol prior to therapy.  RN notified at end of session that pt would like her pain medication.  Tx focused on gaitx 67' using RW with patient needing supervision assist for safe transfers and ambulation.  Pt was fatigued at end of gait.  Pt  further educated with and completed TKR exercises.  Pt tolerated new exercises well but assist needed for SLR and knee extension due to fatigue and weakness.  Modified adduction exercise to seated EOM due to limited knee flexion ROM and pain.    Deirdre Pippins 09/16/2011, 1:43 PM

## 2011-09-16 NOTE — Evaluation (Signed)
Recreational Therapy Assessment and Plan  Patient Details  Name: [name] MRN: [id number] Date of Birth: [birth year] Today's Date: 09/16/2011  Rehab Potential: Good ELOS: 7 days   Assessment Clinical Impression: Problem List:  Patient Active Problem List   Diagnoses   .  HEPATITIS C   .  HYPERTENSION   .  C V A / STROKE   .  ASTHMA   .  SLEEP APNEA   .  DYSPNEA   .  Diabetes mellitus   .  Arthritis   .  Sleep apnea, obstructive   .  Morbid obesity   .  Dysfunctional uterine bleeding   .  Cervical spondylosis with myelopathy   .  Osteoarthritis of both knees   .  Anemia   .  ARF (acute renal failure)   .  Paresthesias   .  Total knee replacement status, left    Past Medical History:  Past Medical History   Diagnosis  Date   .  Hypertension    .  Arthritis    .  Asthma    .  Morbid obesity    .  PE (pulmonary embolism)    .  DVT (deep venous thrombosis)    .  GERD (gastroesophageal reflux disease)    .  Dysfunctional uterine bleeding    .  Headache      MIGRAINES IN PAST   .  Sleep apnea, obstructive      USES C-PAP   .  Glaucoma     Past Surgical History:  Past Surgical History   Procedure  Date   .  Posterior laminectomy / decompression cervical spine  01/26/2011   .  Glaucoma surgery  10/31/2009   .  Gastric bypass  [id number]   .  Knee arthroscopy  12/2007   .  Dilation and curettage of uterus    .  Hysteroscopy      X2   .  Eye surgery  11/16/2009     FOR GLAUCOMA AND LASER SURGERY X2   .  Cataract surg    .  Ivc filter      2010 - THEN REMOVED THEN PLACED AGAIN 2012   .  Total knee arthroplasty  09/09/2011     Procedure: TOTAL KNEE ARTHROPLASTY; Surgeon: Javier Docker, MD; Location: [facility]; Service: Orthopedics; Laterality: Left;    Clinical Impression:[name] is an 51 y.o. Female with history of morbid obesity, OSA, endstage DJD left knee, who elected to undergo L-TKR on 05/23 by Dr Shelle Iron. Post op WBAT and chronic coumadin resumed.  Post op reported peri-oral and bilateral hand numbness with hypotension-BP 86/60. Hospitalist consulted and recommended transfusion with 1 unit PRBC for ABLA and felt numbness due to history of cervical myelopathy v/s fitting of CPAP mask. B 12 levels normal a>2000. Was transfused with additional unit 5/27 and Hgb @7 .1 5/28. Patient transferred to CIR on 09/15/2011 .   Pt presents with decreased activity tolerance, decreased functional mobility, decreased balance, increased pain limiting pt's independence with leisure/community. Leisure History/Participation Premorbid leisure interest/current participation: Garment/textile technologist - Press photographer - Physicist, medical (word games- Pharmacist, hospital) Expression Interests: Music (Comment) Other Leisure Interests: Television Leisure Participation Style: Alone;With Family/Friends Awareness of Community Resources: Good-identify 3 post discharge leisure resources Psychosocial / Spiritual Social interaction - Mood/Behavior: Cooperative Firefighter Appropriate for Education?: Yes Recreational Therapy Orientation Orientation -Reviewed with patient: Available activity resources Strengths/Weaknesses Patient Strengths/Abilities: Willingness to participate Patient weaknesses: Physical limitations  Plan Rec  Therapy Plan Is patient appropriate for Therapeutic Recreation?: Yes Rehab Potential: Good Treatment times per week: Min 1 time per week>25 minutes Estimated Length of Stay: 7 days TR Treatment/Interventions: Adaptive equipment instruction;1:1 session;Balance/vestibular training;Community reintegration;Functional mobility training;Patient/family education;Recreation/leisure participation;Therapeutic activities;Therapeutic exercise  Recommendations for other services: None  Discharge Criteria: Patient will be discharged from TR if patient refuses treatment 3 consecutive times without medical reason.  If treatment goals not met, if there is a change in medical  status, if patient makes no progress towards goals or if patient is discharged from hospital.  The above assessment, treatment plan, treatment alternatives and goals were discussed and mutually agreed upon: by patient  [name] 09/16/2011, 5:07 PM

## 2011-09-16 NOTE — Progress Notes (Signed)
Social Work  Social Work Assessment and Plan  Patient Details  Name: [name] MRN: [id number] Date of Birth: 13-Mar-1961  Today's Date: 09/16/2011  Problem List:  Patient Active Problem List  Diagnoses  . HEPATITIS C  . HYPERTENSION  . C V A / STROKE  . ASTHMA  . SLEEP APNEA  . DYSPNEA  . Diabetes mellitus  . Arthritis  . Sleep apnea, obstructive  . Morbid obesity  . Dysfunctional uterine bleeding  . Cervical spondylosis with myelopathy  . Osteoarthritis of both knees  . Anemia  . ARF (acute renal failure)  . Paresthesias  . Total knee replacement status, left   Past Medical History:  Past Medical History  Diagnosis Date  . Hypertension   . Arthritis   . Asthma   . Morbid obesity   . PE (pulmonary embolism)   . DVT (deep venous thrombosis)   . GERD (gastroesophageal reflux disease)   . Dysfunctional uterine bleeding   . Headache     MIGRAINES IN PAST  . Sleep apnea, obstructive     USES C-PAP  . Glaucoma    Past Surgical History:  Past Surgical History  Procedure Date  . Posterior laminectomy / decompression cervical spine 01/26/2011  . Glaucoma surgery 10/31/2009  . Gastric bypass [id number]  . Knee arthroscopy 12/2007  . Dilation and curettage of uterus   . Hysteroscopy     X2  . Eye surgery 11/16/2009    FOR GLAUCOMA AND LASER SURGERY  X2  . Cataract surg   . Ivc filter     2010 - THEN REMOVED THEN PLACED AGAIN 2012  . Total knee arthroplasty 09/09/2011    Procedure: TOTAL KNEE ARTHROPLASTY;  Surgeon: Javier Docker, MD;  Location: [facility];  Service: Orthopedics;  Laterality: Left;   Social History:  reports that she has never smoked. She has never used smokeless tobacco. She reports that she does not drink alcohol or use illicit drugs.  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[employer]) Other Supports: Local friends:  Astrid Drafts @ [phone] and Rocky Crafts @ [phone] plus brother in [place] (only  sibling in [place]), Netty Starring @ [phone] Anticipated Caregiver: sister, Jenna Luo, from [place] plans to arrive in [place] on 6/3 to stay a minimum of 6 weeks Ability/Limitations of Caregiver: none Caregiver Availability: 24/7 Family Dynamics: pt describes very close relationship with all siblings - Very excited about sister coming to town to assist her  Social History Preferred language: English Religion: Non-Denominational Cultural Background: NA Education: Master's Degree Read: Yes Write: Yes Employment Status: Employed Name of Employer: Engineer, manufacturing systems of Employment: 21  (years) Return to Work Plans: As soon as she is medically cleared to return Fish farm manager Issues: none Guardian/Conservator: none   Abuse/Neglect Physical Abuse: Denies Verbal Abuse: Denies Sexual Abuse: Denies Exploitation of patient/patient's resources: Denies Self-Neglect: Denies  Emotional Status Pt's affect, behavior adn adjustment status: Pt very talkative and humorous throughout intake interview - admits frustration with having to have surgery, yet she "knew it was [place] happen...just tried to put it off as long as I could" Recent Psychosocial Issues: CIR in Oct 2012 due to stenosis Pyschiatric History: none Substance Abuse History: none  Patient / Family Perceptions, Expectations & Goals Pt/Family understanding of illness & functional limitations: Pt with good understanding of surgery performed and purpose of CIR stay -  Premorbid pt/family roles/activities: pt was independent PTA and had returned to  work in Jan 2013 - active at community level, yet slowed by orthopedic issues Anticipated changes in roles/activities/participation: Anticipated only temporary change with sister assuming a caregiver role until pt regains full independence Pt/family expectations/goals: "I just want to do the program and get home"  [employer]:  None Premorbid Home Care/DME Agencies: Other (Comment) ([facility]. after last CIR stay) Transportation available at discharge: yes  Discharge Planning Living Arrangements: Alone Support Systems: Other relatives;Friends/neighbors Type of Residence: Private residence Insurance Resources: Media planner (specify) Chief Executive Officer) Financial Resources: Employment Financial Screen Referred: No Living Expenses: Database administrator Management: Patient Home Management: patient Patient/Family Preliminary Plans: Pt plans to return to her own home with sister to stay at least 6 weeks Social Work Anticipated Follow Up Needs: HH/OP Expected length of stay: one week  Clinical Impression Pleasant, oriented and very talkative woman here after a TKR but familiar to CIR from oct 2012 stay (back).  Very motivated and eager to be able to return to work.  Good family support.  No significant emotional distress noted/ reported but will monitor.  Will assist with d/c referrals.  Amada Jupiter  Momin Misko 09/16/2011, 4:09 PM

## 2011-09-16 NOTE — Care Management (Signed)
[facility]patient [facility] Individual Statement of Services  Patient Name:  [name]  Date:  09/16/2011  Welcome to the [facility].  Our goal is to provide you with an individualized program based on your diagnosis and situation, designed to meet your specific needs.  With this comprehensive rehabilitation program, you will be expected to participate in at least 3 hours of rehabilitation therapies Monday-Friday, with modified therapy programming on the weekends.  Your rehabilitation program will include the following services:  Physical Therapy (PT), Occupational Therapy (OT), 24 hour per day rehabilitation nursing, Therapeutic Recreaction (TR), Case Management (RN and Child psychotherapist), Rehabilitation Medicine, Nutrition Services and Pharmacy Services  Weekly team conferences will be held on  Tuesday  to discuss your progress.  Your RN Case Designer, television/film set will talk with you frequently to get your input and to update you on team discussions.  Team conferences with you and your family in attendance may also be held.  Expected length of stay: @ 7 days                                                                                                                          Overall anticipated outcome: Modified Independent household & Supervision community  Depending on your progress and recovery, your program may change.  Your RN Case Estate agent will coordinate services and will keep you informed of any changes.  Your RN Sports coach and SW names and contact numbers are listed  below.  The following services may also be recommended but are not provided by the [facility]patient [facility]:   Driving Evaluations  Home Health Rehabiltiation Services  Outpatient Rehabilitatation [facility]  Vocational Rehabilitation   Arrangements will be made to provide these services after discharge if needed.  Arrangements include referral to agencies that  provide these services.  Your insurance has been verified to be:  [employer] Your primary doctor is:  Dr. [name]  Pertinent information will be shared with your doctor and your insurance company.  Case Manager: Melanee Spry, [facility] [phone]  Social Worker:  [place], [place] [phone]  Information discussed with and copy given to patient by: Brock Ra, 09/16/2011, 3:01 PM

## 2011-09-16 NOTE — Progress Notes (Signed)
Orthopedic Tech Progress Note Patient Details:  [name] November 01, 1960 [id number] Put on cpm. Patient ID: [name], female   DOB: 09-Oct-1960, 51 y.o.   MRN: [id number]   Jennye Moccasin 09/16/2011, 9:44 PM

## 2011-09-16 NOTE — Progress Notes (Signed)
Reviewed and in agreement with treatment provided.  

## 2011-09-16 NOTE — Evaluation (Signed)
Occupational Therapy Assessment and Plan & Session Notes  Patient Details  Name: [name] MRN: [id number] Date of Birth: 06-07-1960  OT Diagnosis: acute pain and muscle weakness (generalized) Rehab Potential: Rehab Potential: Good ELOS: ~ 7 days   Today's Date: 09/16/2011  ASSESSMENT AND PLAN  Problem List:  Patient Active Problem List  Diagnoses  . HEPATITIS C  . HYPERTENSION  . C V A / STROKE  . ASTHMA  . SLEEP APNEA  . DYSPNEA  . Diabetes mellitus  . Arthritis  . Sleep apnea, obstructive  . Morbid obesity  . Dysfunctional uterine bleeding  . Cervical spondylosis with myelopathy  . Osteoarthritis of both knees  . Anemia  . ARF (acute renal failure)  . Paresthesias  . Total knee replacement status, left    Past Medical History:  Past Medical History  Diagnosis Date  . Hypertension   . Arthritis   . Asthma   . Morbid obesity   . PE (pulmonary embolism)   . DVT (deep venous thrombosis)   . GERD (gastroesophageal reflux disease)   . Dysfunctional uterine bleeding   . Headache     MIGRAINES IN PAST  . Sleep apnea, obstructive     USES C-PAP  . Glaucoma    Past Surgical History:  Past Surgical History  Procedure Date  . Posterior laminectomy / decompression cervical spine 01/26/2011  . Glaucoma surgery 10/31/2009  . Gastric bypass [id number]  . Knee arthroscopy 12/2007  . Dilation and curettage of uterus   . Hysteroscopy     X2  . Eye surgery 11/16/2009    FOR GLAUCOMA AND LASER SURGERY  X2  . Cataract surg   . Ivc filter     2010 - THEN REMOVED THEN PLACED AGAIN 2012  . Total knee arthroplasty 09/09/2011    Procedure: TOTAL KNEE ARTHROPLASTY;  Surgeon: Javier Docker, MD;  Location: [facility];  Service: Orthopedics;  Laterality: Left;   Clinical Impression:[name] is an 51 y.o. Female with history of morbid obesity, OSA, endstage DJD left knee, who elected to undergo L-TKR on 05/23 by Dr Shelle Iron. Post op WBAT and chronic coumadin resumed.  Post op reported peri-oral and bilateral hand numbness with hypotension-BP 86/60. Hospitalist consulted and recommended transfusion with 1 unit PRBC for ABLA and felt numbness due to history of cervical myelopathy v/s fitting of CPAP mask. B 12 levels normal a>2000. Was transfused with additional unit 5/27 and Hgb @7 .1 5/28. Patient transferred to CIR on 09/15/2011 .    Patient currently requires supervision -> min assist with basic self-care skills and IADL secondary to muscle weakness and decreased standing balance and decreased balance strategies.  Prior to hospitalization, patient could complete ADLs and IADLs at modified independent level.  Patient will benefit from skilled intervention to increase independence with basic self-care skills prior to discharge home independently.  Anticipate patient will require intermittent supervision and no further OT follow recommended.  OT - End of Session Activity Tolerance: Tolerates 30+ min activity with multiple rests OT Assessment Rehab Potential: Good Barriers to Discharge: None (patient lives alone, goals set for mod I) OT Plan OT Frequency: 1-2 X/day, 60-90 minutes Estimated Length of Stay: ~ 7 days OT Treatment/Interventions: Balance/vestibular training;Community reintegration;Discharge planning;DME/adaptive equipment instruction;Functional mobility training;Neuromuscular re-education;Pain management;Patient/family education;Psychosocial support;Self Care/advanced ADL retraining;Skin care/wound managment;Splinting/orthotics;Therapeutic Activities;Therapeutic Exercise;UE/LE Coordination activities;UE/LE Strength taining/ROM;Wheelchair propulsion/positioning OT Recommendation Follow Up Recommendations: None Equipment Recommended: None recommended by OT Equipment Details: none at this time; patient states she has 3-in1  and padded tub bench  Precautions/Restrictions  Precautions Precautions: Fall Required Braces or Orthoses: Knee Immobilizer -  Left Restrictions Weight Bearing Restrictions: Yes LLE Weight Bearing: Weight bearing as tolerated  General Chart Reviewed: Yes  Home Living/Prior Functioning Home Living Lives With: Alone Available Help at Discharge: Friend(s) (friend comes ~1 time a month to clean house) Type of Home: Apartment Home Access: Level entry (3 "platform steps") Home Layout: One level Bathroom Shower/Tub: Tub/shower unit;Curtain Bathroom Toilet: Handicapped height Bathroom Accessibility: Yes How Accessible: Accessible via walker Home Adaptive Equipment: Bedside commode/3-in-1;Reacher;Walker - rolling;Other (comment) (and padded tub bench for showers) IADL History Homemaking Responsibilities: Yes Prior Function Level of Independence: Independent with basic ADLs;Independent with homemaking with ambulation;Independent with gait;Independent with transfers;Requires assistive device for independence Able to Take Stairs?: Yes Driving: Yes  ADL - See FIM  Vision/Perception  Vision - History Baseline Vision: Wears glasses all the time Patient Visual Report: No change from baseline Vision - Assessment Eye Alignment: Within Functional Limits Perception Perception: Within Functional Limits Praxis Praxis: Intact   Cognition Overall Cognitive Status: Appears within functional limits for tasks assessed Arousal/Alertness: Awake/alert Orientation Level: Oriented X4 Memory: Appears intact Awareness: Appears intact Problem Solving: Appears intact Safety/Judgment: Appears intact  Sensation Sensation Additional Comments: UEs appear intact Coordination Gross Motor Movements are Fluid and Coordinated: Yes Fine Motor Movements are Fluid and Coordinated: Yes  Motor - See Evaluation Navigator  Mobility - See Evaluation Navigator  Trunk/Postural Assessment - See Evaluation Navigator  Balance- See Evaluation Navigator  Extremity/Trunk Assessment RUE Assessment RUE Assessment: Within Functional  Limits LUE Assessment LUE Assessment: Within Functional Limits  See FIM for current functional status  Refer to Care Plan for Long Term Goals  Recommendations for other services: None  Discharge Criteria: Patient will be discharged from OT if patient refuses treatment 3 consecutive times without medical reason, if treatment goals not met, if there is a change in medical status, if patient makes no progress towards goals or if patient is discharged from hospital.  The above assessment, treatment plan, treatment alternatives and goals were discussed and mutually agreed upon: by patient  SESSION NOTES  Session #1 [phone] - 60 Minutes Individual Therapy Patient with 4/10 complaints of pain in left knee; RN aware and assisted by repositioning.  Initial 1;1 OT evaluation completed. Focused skilled intervention on bed mobility, functional mobility & ambulation using rolling walker, toilet transfers, toileting (peri care & clothing management), sit/stands, UB/LB bathing & dressing, grooming tasks at sink, and overall activity tolerance/endurance. Left patient seated in recliner with call bell, phone, and breakfast tray.   Session #2 1330-1400 - 30 Minutes Individual Therapy No complaints of pain Focused skilled intervention on functional mobility & ambulation using rolling walker from room -> ADL apartment with more than reasonable amount of time and tub/shower transfer onto tub transfer bench. Patient able to perform tub/shower transfer with min assist, therapist assisting with getting LLE into tub. Introduced leg lifter to assist with lifting leg for tub/shower transfers and potentially bed mobility as well.   Laker Thompson 09/16/2011, 11:24 AM

## 2011-09-16 NOTE — Progress Notes (Signed)
ANTICOAGULATION CONSULT NOTE - Follow Up Consult  Pharmacy Consult for coumadin Indication: PMH = DVT/PE, now s/p L TKA 5/23  Allergies  Allergen Reactions  . Beta Adrenergic Blockers Other (See Comments)    RED EYES AND CONGESTION  . Penicillins Hives, Itching and Swelling  . Sulfa Antibiotics Hives    RED EYES  . Thimerosal Hives and Itching    Patient Measurements: Height: 5\' 4"  (162.6 cm) Weight: 277 lb 1.9 oz (125.7 kg) IBW/kg (Calculated) : 54.7  Heparin Dosing Weight:   Vital Signs: Temp: 98.7 F (37.1 C) (05/29 0541) Temp src: Oral (05/29 0541) BP: 121/78 mmHg (05/29 0541) Pulse Rate: 88  (05/29 0541)  Labs:  Basename 09/16/11 0620 09/15/11 0417 09/14/11 0416 09/13/11 1030  HGB 8.5* 8.9* -- --  HCT 25.1* 26.0* 22.0* --  PLT 176 143* 126* --  APTT -- -- -- --  LABPROT 29.5* 26.8* 25.5* --  INR 2.75* 2.43* 2.28* --  HEPARINUNFRC -- -- -- --  CREATININE 0.92 -- 1.01 0.94  CKTOTAL -- -- -- --  CKMB -- -- -- --  TROPONINI -- -- -- --    Estimated Creatinine Clearance: 93.8 ml/min (by C-G formula based on Cr of 0.92).  Medications:  Scheduled:     . bimatoprost  1 drop Left Eye QHS  . brimonidine  1 drop Both Eyes TID  . brinzolamide  1 drop Left Eye TID  . cyanocobalamin  2,500 mcg Oral Daily  . diltiazem  240 mg Oral QHS  . docusate sodium  100 mg Oral BID  . fluticasone  1 spray Each Nare Daily  . gabapentin  100 mg Oral QHS  . iron polysaccharides  150 mg Oral q12n4p  . loratadine  10 mg Oral Daily  . magnesium chloride  2 tablet Oral QHS  . magnesium chloride  1 tablet Oral Daily  . oxyCODONE  10 mg Oral Q12H  . pantoprazole  80 mg Oral Q1200  . polyvinyl alcohol  1 drop Both Eyes TID  . warfarin  10 mg Oral Custom  . warfarin  5 mg Oral Custom  . Warfarin - Pharmacist Dosing Inpatient   Does not apply q1800  . DISCONTD: niacin  500 mg Oral Q breakfast    Assessment: 50 YOF transferring from [facility]/28 for rehab s/p TKA on 5/23.  INR is  therapeutic today. 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  Hgb=8.5 and platelets = 176 (improving).  No bleeding noted.   Goal of Therapy:  INR 2-3 Monitor platelets by anticoagulation protocol: Yes   Plan:  1. Continue home regimen: 10mg  TTSa, 5mg  MWFsu 2. Daily INR for now  [place] L. Illene Bolus, PharmD, BCPS Clinical Pharmacist Pager: [phone] 09/16/2011 8:30 AM

## 2011-09-16 NOTE — Progress Notes (Signed)
Patient ID: [name], female   DOB: [birth year], 51 y.o.   MRN: [id number] Subjective/Complaints: Slept well. Knee pain under control Other ROS items reviewed and negative  Objective: Vital Signs: Blood pressure 121/78, pulse 88, temperature 98.7 F (37.1 C), temperature source Oral, resp. rate 18, height 5\' 4"  (1.626 m), weight 125.7 kg (277 lb 1.9 oz), last menstrual period 07/26/2011, SpO2 99.00%. No results found.  Basename 09/16/11 0620 09/15/11 0417  WBC 7.4 7.1  HGB 8.5* 8.9*  HCT 25.1* 26.0*  PLT 176 143*    Basename 09/16/11 0620 09/14/11 0416  NA 138 138  K 4.0 4.1  CL 103 104  CO2 22 25  GLUCOSE 93 98  BUN 10 9  CREATININE 0.92 1.01  CALCIUM 8.9 8.5   CBG (last 3)  No results found for this basename: GLUCAP:3 in the last 72 hours  Wt Readings from Last 3 Encounters:  09/16/11 125.7 kg (277 lb 1.9 oz)  09/09/11 124.739 kg (275 lb)  09/09/11 124.739 kg (275 lb)    Physical Exam:  General appearance: alert, cooperative and no distress Head: Normocephalic, without obvious abnormality, atraumatic Eyes: conjunctivae/corneas clear. PERRL, EOM's intact. Fundi benign. Ears: normal TM's and external ear canals both ears Nose: Nares normal. Septum midline. Mucosa normal. No drainage or sinus tenderness. Throat: lips, mucosa, and tongue normal; teeth and gums normal Neck: no adenopathy, no carotid bruit, no JVD, supple, symmetrical, trachea midline and thyroid not enlarged, symmetric, no tenderness/mass/nodules Back: symmetric, no curvature. ROM normal. No CVA tenderness. Resp: clear to auscultation bilaterally Cardio: regular rate and rhythm, S1, S2 normal, no murmur, click, rub or gallop GI: soft, non-tender; bowel sounds normal; no masses,  no organomegaly Extremities: extremities normal, atraumatic, no cyanosis or edema Pulses: 2+ and symmetric Skin: Skin color, texture, turgor normal. No rashes or lesions Neurologic: CN exam normal . Pain inhibition  weakness in left LE. No sensory changes. Cognition appropriate Incision/Wound:  Wound clean and intact without drainage M/S: knee ROM about 55 degrees  Assessment/Plan: 1. Functional deficits secondary to left TKA, morbid obesity which require 3+ hours per day of interdisciplinary therapy in a comprehensive inpatient rehab setting. Physiatrist is providing close team supervision and 24 hour management of active medical problems listed below. Physiatrist and rehab team continue to assess barriers to discharge/monitor patient progress toward functional and medical goals. FIM:                   Comprehension Comprehension Mode: Auditory Comprehension: 7-Follows complex conversation/direction: With no assist  Expression Expression Mode: Verbal Expression: 7-Expresses complex ideas: With no assist  Social Interaction Social Interaction: 6-Interacts appropriately with others with medication or extra time (anti-anxiety, antidepressant).  Problem Solving Problem Solving: 7-Solves complex problems: Recognizes & self-corrects  Memory Memory: 7-Complete Independence: No helper  1. DVT Prophylaxis/Anticoagulation: Pharmaceutical: Coumadin  2. Pain Management:  OxyContin initiated for more consistent pain relief as requiring pain medication through the night. May need premedication with IR prior to therapies.  3. Mood: Motivated to get better. Will have SW to follow up for formal evaluation.  4. ABLA: Hgb with some improvement past transfusion. Will monitor for recurrent hypotension. Check stool guaiacs to rule out GIB as therapeutic on chronic coumadin. Tachycardia noted. Added iron supplement. Recheck hgb again on  Friday. 5. OSA: Continue to encourage CPAP use when sleeping  6. Morbid obesity: Pressure relief measures. Patient adheres to CM diet to help with weight control.  7. HTN: On cardizem at HS.  May need to hold BP medications if patient orthostatic. Continue to hold diovan  HCTZ.  8. GERD: continue protonix     LOS (Days) 1 A FACE TO FACE EVALUATION WAS PERFORMED  Geraldine Tesar T 09/16/2011, 8:17 AM

## 2011-09-16 NOTE — Progress Notes (Signed)
Patient refused CPM, states she needed rest per MD.

## 2011-09-17 LAB — URINE CULTURE
Colony Count: 10000
Culture  Setup Time: 201305281445

## 2011-09-17 LAB — PROTIME-INR
INR: 2.43 — ABNORMAL HIGH (ref 0.00–1.49)
Prothrombin Time: 26.8 seconds — ABNORMAL HIGH (ref 11.6–15.2)

## 2011-09-17 NOTE — Progress Notes (Signed)
Reviewed and in agreement with treatment provided.  

## 2011-09-17 NOTE — Progress Notes (Signed)
Ambulates with RW with knee immobilzer. Remains continent of bowel and bladder. Last bowel movement 09/16/11.  L  Knee incision CDI. Skin abrasion to R inner thigh and. L knee proximal fluid filled blister with tegaderm intact. Pain manged with prn pain medication

## 2011-09-17 NOTE — Progress Notes (Signed)
ANTICOAGULATION CONSULT NOTE - Follow Up Consult  Pharmacy Consult for coumadin Indication: PMH = DVT/PE, now s/p L TKA 5/23  Vital Signs: Temp: 97.6 F (36.4 C) (05/30 0618) Temp src: Oral (05/30 0618) BP: 115/73 mmHg (05/30 0618) Pulse Rate: 96  (05/30 0618)  Labs:  Basename 09/17/11 0645 09/16/11 0620 09/15/11 0417  HGB -- 8.5* 8.9*  HCT -- 25.1* 26.0*  PLT -- 176 143*  APTT -- -- --  LABPROT 26.8* 29.5* 26.8*  INR 2.43* 2.75* 2.43*  HEPARINUNFRC -- -- --  CREATININE -- 0.92 --  CKTOTAL -- -- --  CKMB -- -- --  TROPONINI -- -- --    Estimated Creatinine Clearance: 93.8 ml/min (by C-G formula based on Cr of 0.92).  Assessment: Pt s/p TKA on 5/23.  INR is therapeutic today. 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 No bleeding noted.   Goal of Therapy:  INR 2-3 Monitor platelets by anticoagulation protocol: Yes   Plan:  1. Continue home regimen: 10mg  TTSa, 5mg  MWFsu 2. Daily INR for now  Anamika Kueker K. Allena Katz, PharmD, BCPS.  Clinical Pharmacist Pager [phone]. 09/17/2011 12:39 PM

## 2011-09-17 NOTE — Progress Notes (Signed)
Physical Therapy Note  Patient Details  Name: [name] MRN: [id number] Date of Birth: 1961/03/09 Today's Date: 09/17/2011  Time: 1430-1500 30 minutes  Pt c/o 6/10 L knee pain, RN made aware, ice pack applied after session.  Gait training to/from gym with RW with supervision, cues for step through pattern with R LE and for heel flat on L LE.  Pt able to maintain improved gait pattern with min cuing.  Ramp/curb training for community reentry with cues for safety and technique, pt performed with supervision.  Side stepping for home obstacle negotiation and B hip strengthening with supervision.  Pt with good activity tolerance and safety awareness.  Individual therapy   Robbyn Hodkinson 09/17/2011, 3:40 PM

## 2011-09-17 NOTE — Progress Notes (Signed)
Physical Therapy Session Note  Patient Details  Name: [name] MRN: [id number] Date of Birth: 11/21/1960  Today's Date: 09/17/2011 Time: 1100-1200  Time Calculation (min): 60 min  Short Term Goals: Week 1:  PT Short Term Goal 1 (Week 1): =LTGs  Skilled Therapeutic Interventions/Progress Updates:    Tx focused on gait, activity tolerance, strengthening and endurance.  Pt feels as though she is able to bear more weight through LLE with gait.  Nustep x15' at workload 3 within tolerable L knee ROM.  Pt practiced stepping forwards, backwards, and side-steps to L over obstacles using RW to increase WB through LLE and balance.  Pt performed gt with dual task and stepping over obstacles and returned to recliner. *Pt is to wear KI during all amb until she is able to attain SLR without lag per PA*  Therapy Documentation Precautions:  Precautions Precautions: Fall Required Braces or Orthoses: Knee Immobilizer - Left Restrictions Weight Bearing Restrictions: No LLE Weight Bearing: Weight bearing as tolerated  Pain: Pt reports 3/10 "soreness" but not pain.  Received pain med prior to therapy.  Pt reports that her leg has starting cramping/"spasming"  See FIM for current functional status  Therapy/Group: Individual Therapy  Deirdre Pippins 09/17/2011, 11:28 AM

## 2011-09-17 NOTE — Progress Notes (Signed)
Patient ID: [name], female   DOB: Jul 07, 1960, 51 y.o.   MRN: [id number] Patient ID: [name], female   DOB: December 10, 1960, 51 y.o.   MRN: [id number] Subjective/Complaints: Slept well. Knee pain under control- a couple irrittaed spots on right leg.  Other ROS items reviewed and negative  Objective: Vital Signs: Blood pressure 115/73, pulse 96, temperature 97.6 F (36.4 C), temperature source Oral, resp. rate 18, height 5\' 4"  (1.626 m), weight 125.7 kg (277 lb 1.9 oz), last menstrual period 07/26/2011, SpO2 100.00%. No results found.  Basename 09/16/11 0620 09/15/11 0417  WBC 7.4 7.1  HGB 8.5* 8.9*  HCT 25.1* 26.0*  PLT 176 143*    Basename 09/16/11 0620  NA 138  K 4.0  CL 103  CO2 22  GLUCOSE 93  BUN 10  CREATININE 0.92  CALCIUM 8.9   CBG (last 3)  No results found for this basename: GLUCAP:3 in the last 72 hours  Wt Readings from Last 3 Encounters:  09/17/11 125.7 kg (277 lb 1.9 oz)  09/09/11 124.739 kg (275 lb)  09/09/11 124.739 kg (275 lb)    Physical Exam:  General appearance: alert, cooperative and no distress Head: Normocephalic, without obvious abnormality, atraumatic Eyes: conjunctivae/corneas clear. PERRL, EOM's intact. Fundi benign. Ears: normal TM's and external ear canals both ears Nose: Nares normal. Septum midline. Mucosa normal. No drainage or sinus tenderness. Throat: lips, mucosa, and tongue normal; teeth and gums normal Neck: no adenopathy, no carotid bruit, no JVD, supple, symmetrical, trachea midline and thyroid not enlarged, symmetric, no tenderness/mass/nodules Back: symmetric, no curvature. ROM normal. No CVA tenderness. Resp: clear to auscultation bilaterally Cardio: regular rate and rhythm, S1, S2 normal, no murmur, click, rub or gallop GI: soft, non-tender; bowel sounds normal; no masses,  no organomegaly Extremities: extremities normal, atraumatic, no cyanosis or edema Pulses: 2+ and symmetric Skin: Skin color, texture, turgor  normal. No rashes or lesions Neurologic: CN exam normal . Pain inhibition weakness in left LE. No sensory changes. Cognition appropriate Incision/Wound:  Wound clean and intact without drainage. Small blister on lateral leg (left) M/S: knee ROM about 60 degrees  Assessment/Plan: 1. Functional deficits secondary to left TKA, morbid obesity which require 3+ hours per day of interdisciplinary therapy in a comprehensive inpatient rehab setting. Physiatrist is providing close team supervision and 24 hour management of active medical problems listed below. Physiatrist and rehab team continue to assess barriers to discharge/monitor patient progress toward functional and medical goals. FIM: FIM - Bathing Bathing Steps Patient Completed: Chest;Right Arm;Left Arm;Abdomen;Front perineal area;Buttocks;Right upper leg;Left upper leg Bathing: 4: Min-Patient completes 8-9 47f 10 parts or 75+ percent  FIM - Upper Body Dressing/Undressing Upper body dressing/undressing steps patient completed: Thread/unthread right bra strap;Thread/unthread left bra strap;Hook/unhook bra;Thread/unthread right sleeve of pullover shirt/dresss;Thread/unthread left sleeve of pullover shirt/dress;Put head through opening of pull over shirt/dress;Pull shirt over trunk Upper body dressing/undressing: 4: Min-Patient completed 75 plus % of tasks (to help patient bring bra around to front) FIM - Lower Body Dressing/Undressing Lower body dressing/undressing steps patient completed: Thread/unthread right underwear leg;Pull underwear up/down;Thread/unthread right pants leg;Pull pants up/down Lower body dressing/undressing: 3: Mod-Patient completed 50-74% of tasks  FIM - Toileting Toileting steps completed by patient: Adjust clothing prior to toileting;Performs perineal hygiene;Adjust clothing after toileting Toileting: 4: Steadying assist  FIM - Diplomatic Services operational officer Devices: Walker;Elevated toilet seat Toilet  Transfers: 4-To toilet/BSC: Min A (steadying Pt. > 75%);4-From toilet/BSC: Min A (steadying Pt. > 75%)  FIM - Bed/Chair  Transport planner Devices: Environmental consultant;Arm rests Bed/Chair Transfer: 5: Supine > Sit: Supervision (verbal cues/safety issues);4: Sit > Supine: Min A (steadying pt. > 75%/lift 1 leg);4: Bed > Chair or W/C: Min A (steadying Pt. > 75%);4: Chair or W/C > Bed: Min A (steadying Pt. > 75%)  FIM - Locomotion: Wheelchair Locomotion: Wheelchair: 1: Total Assistance/staff pushes wheelchair (Pt<25%) FIM - Locomotion: Ambulation Locomotion: Ambulation Assistive Devices: Designer, industrial/product Ambulation/Gait Assistance: 4: Min assist Locomotion: Ambulation: 4: Travels 150 ft or more with minimal assistance (Pt.>75%)  Comprehension Comprehension Mode: Auditory Comprehension: 7-Follows complex conversation/direction: With no assist  Expression Expression Mode: Verbal Expression: 7-Expresses complex ideas: With no assist  Social Interaction Social Interaction: 6-Interacts appropriately with others with medication or extra time (anti-anxiety, antidepressant). (xanax at HS)  Problem Solving Problem Solving: 7-Solves complex problems: Recognizes & self-corrects  Memory Memory: 7-Complete Independence: No helper  1. DVT Prophylaxis/Anticoagulation: Pharmaceutical: Coumadin  2. Pain Management:  OxyContin initiated for more consistent pain relief as requiring pain medication through the night. May need premedication with IR prior to therapies.  3. Mood: Motivated to get better. Will have SW to follow up for formal evaluation.  4. ABLA: Hgb with some improvement past transfusion. Will monitor for recurrent hypotension. Check stool guaiacs to rule out GIB as therapeutic on chronic coumadin. Tachycardia noted. Added iron supplement. Recheck hgb again on  Friday. 5. OSA: Continue to encourage CPAP use when sleeping  6. Morbid obesity: Pressure relief measures. Patient adheres to  CM diet to help with weight control.  7. HTN: On cardizem at HS. May need to hold BP medications if patient orthostatic. Continue to hold diovan HCTZ.  8. GERD: continue protonix 9. Wound care- local care to blisters/skin irritation  -be careful to avoid excessive tape or tight fitting garments (skin is somewhat sensitive)     LOS (Days) 2 A FACE TO FACE EVALUATION WAS PERFORMED  Corliss Coggeshall T 09/17/2011, 8:11 AM

## 2011-09-17 NOTE — Progress Notes (Signed)
Occupational Therapy Session Note  Patient Details  Name: TREENA COSMAN MRN: [id number] Date of Birth: [birth year]  Today's Date: 09/17/2011 Time: 1000-1100 Time Calculation (min): 60 min  Short Term Goals: Week 1:  OT Short Term Goal 1 (Week 1): Short Term Goals = Long Term Goals  Skilled Therapeutic Interventions/Progress Updates:    Pt engaged in ADL retraining including bathing and dressing w/c level at sink.  Pt required assistance to don KI prior to ambulating bathroom for toileting.  Pt completed all bathing and dressing activities at supervision level without AE.  Pt required min verbal cues for w/c safety (locking brakes).  Focus on activity tolerance, safety awareness, energy conservation, and compensatory strategies.  Therapy Documentation Precautions:  Precautions Precautions: Fall Required Braces or Orthoses: Knee Immobilizer - Left Restrictions Weight Bearing Restrictions: No LLE Weight Bearing: Weight bearing as tolerated   Pain: Pain Assessment Pain Assessment: 0-10 Pain Score:   3 Pain Type: Acute pain Pain Location: Knee Pain Orientation: Left Pain Descriptors: Throbbing Pain Frequency: Intermittent Pain Onset: On-going Pain Intervention(s): Repositioned  See FIM for current functional status  Therapy/Group: Individual Therapy  Rich Brave 09/17/2011, 11:56 AM

## 2011-09-17 NOTE — Progress Notes (Signed)
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  Discussed ways to transport items for meal prep including using walker basket, counter tops, or rolling chair.  Pt is familiar with methods from previous admission and is able to recall them independently.  Also performed one toilet transfer with close supervision including all aspects of toileting.      Session 2:  Practiced functional mobility in the ADL apartment with focus on making up the bed using her RW.  Pt able to gather all sheets and blanket using the walker and make up the bed with overall supervision and min instructional cueing for correct position of walker.  Needed only 1 rest break of less than 2 mins during the task.  Therapy Documentation Precautions:  Precautions Precautions: Fall Required Braces or Orthoses: Knee Immobilizer - Left Restrictions Weight Bearing Restrictions: No LLE Weight Bearing: Weight bearing as tolerated  Pain: Pain Assessment Pain Assessment: No/denies pain  See FIM for current functional status  Therapy/Group: Individual Therapy  Ragena Fiola 09/17/2011, 3:56 PM

## 2011-09-18 ENCOUNTER — Ambulatory Visit (INDEPENDENT_AMBULATORY_CARE_PROVIDER_SITE_OTHER): Payer: BC Managed Care – PPO | Admitting: Surgery

## 2011-09-18 DIAGNOSIS — M1710 Unilateral primary osteoarthritis, unspecified knee: Secondary | ICD-10-CM

## 2011-09-18 DIAGNOSIS — Z5189 Encounter for other specified aftercare: Secondary | ICD-10-CM

## 2011-09-18 DIAGNOSIS — Z96659 Presence of unspecified artificial knee joint: Secondary | ICD-10-CM

## 2011-09-18 LAB — PROTIME-INR
INR: 2.87 — ABNORMAL HIGH (ref 0.00–1.49)
Prothrombin Time: 30.5 seconds — ABNORMAL HIGH (ref 11.6–15.2)

## 2011-09-18 NOTE — Progress Notes (Signed)
Physical Therapy Note  Patient Details  Name: [name] MRN: [id number] Date of Birth: 19-Nov-1960 Today's Date: 09/18/2011  13:00-14:00 group therapy pain unrated in left knee. Pt was premedicated.  Performed step ups to increase left knee flexion x 30 onto 6" step with over pressure. Home environment gait with rw and KI focusing on turns and stepping over obstacles. Supervision.   Julian Reil 09/18/2011, 2:05 PM

## 2011-09-18 NOTE — Progress Notes (Signed)
Physical Therapy Session Note  Patient Details  Name: [name] MRN: [id number] Date of Birth: [birth year]  Today's Date: 09/18/2011 Time: 4098-1191 Time Calculation (min): 62 min  Short Term Goals: Week 1:  PT Short Term Goal 1 (Week 1): =LTGs  Skilled Therapeutic Interventions/Progress Updates:    LE strengthening: Lt. Quad set 10 x 10 sec holds with heel elevated; Lt straight leg raise x 13 reps with quad set to initiate movement (pt requires min assist to complete activity). PROM stretching into extension between reps. NuStep x 15 min level 4 within available range of motion. Seated Lt. Knee stretch into flexion sitting edge of mat, pt actively flexing knee with PT applying gentle overpressure. Gait training 150' x 2 bouts with supervision. Verbal cues to continuously progress RW, pt applied cues well. All sit <> stands performed at supervision level.   Therapy Documentation Precautions:  Precautions Precautions: Fall Required Braces or Orthoses: Knee Immobilizer - Left Restrictions Weight Bearing Restrictions: No LLE Weight Bearing: Weight bearing as tolerated Pain: Pain Assessment Pain Assessment: 0-10 Pain Score:   5 Pain Type: Acute pain Pain Location: Leg Pain Orientation: Left Pain Descriptors: Aching   See FIM for current functional status  Therapy/Group: Individual Therapy  Wilhemina Bonito 09/18/2011, 12:39 PM

## 2011-09-18 NOTE — Progress Notes (Signed)
Occupational Therapy Note  Patient Details  Name: [name] MRN: [id number] Date of Birth: [birth year] Today's Date: 09/18/2011  Session 1 Time: 1100-1200 Pt c/o 4/10 pain in left knee; RN aware and repositioned Individual Therapy Pt engaged in bathing and dressing activities with sit to stand from w/c at sink.  Pt requires assistance with donning KI before standing or ambulating.  Pt completes all tasks with supervision, including gathering clothing.  Focus on activity tolerance, dynamic standing balance, safety awareness, and problem solving.  Session 2 Time: 1415-1445 Pt c/0 pain 2/10 in left knee; repostioned Pt engaged in home mgmt tasks at r/w level and w/c level to put up supplies and clothing.  Pt did not exhibit any unsafe behaviors.  Pt completed all tasks at supervision level.    Lavone Neri [facility] 09/18/2011, 3:26 PM

## 2011-09-18 NOTE — Progress Notes (Signed)
Patient placed herself on cpap. Patient is tolerating cpap at this time. RT will continue to monitor.

## 2011-09-18 NOTE — Progress Notes (Signed)
Patient ID: [name], female   DOB: 07-14-1960, 51 y.o.   MRN: [id number] Subjective/Complaints: Slept well. Knee pain under control- No new c/os Other ROS items reviewed and negative  Objective: Vital Signs: Blood pressure 143/84, pulse 94, temperature 98.5 F (36.9 C), temperature source Oral, resp. rate 18, height 5\' 4"  (1.626 m), weight 125.7 kg (277 lb 1.9 oz), last menstrual period 07/26/2011, SpO2 100.00%. No results found.  Basename 09/16/11 0620  WBC 7.4  HGB 8.5*  HCT 25.1*  PLT 176    Basename 09/16/11 0620  NA 138  K 4.0  CL 103  CO2 22  GLUCOSE 93  BUN 10  CREATININE 0.92  CALCIUM 8.9   CBG (last 3)  No results found for this basename: GLUCAP:3 in the last 72 hours  Wt Readings from Last 3 Encounters:  09/17/11 125.7 kg (277 lb 1.9 oz)  09/09/11 124.739 kg (275 lb)  09/09/11 124.739 kg (275 lb)    Physical Exam:  General appearance: alert, cooperative and no distress Head: Normocephalic, without obvious abnormality, atraumatic Eyes:  Ears:  Nose: Nares normal. Septum midline. Mucosa normal. No drainage or sinus tenderness. Throat: lips, mucosa, and tongue normal; teeth and gums normal Neck: no adenopathy, no carotid bruit, no JVD, supple, symmetrical, trachea midline and thyroid not enlarged, symmetric, no tenderness/mass/nodules Back: symmetric, no curvature. ROM normal. No CVA tenderness. Resp: clear to auscultation bilaterally Cardio: regular rate and rhythm, S1, S2 normal, no murmur, click, rub or gallop GI: soft, non-tender; bowel sounds normal; no masses,  no organomegaly Extremities: extremities normal, atraumatic, no cyanosis or edema Pulses: 2+ and symmetric Skin: Skin color, texture, turgor normal. No rashes or lesions Neurologic: CN exam normal . Pain inhibition weakness in left LE. No sensory changes. Cognition appropriate Incision/Wound:  Wound clean and intact without drainage. Small blister on lateral leg (left) M/S: knee ROM  about 60 degrees  Assessment/Plan: 1. Functional deficits secondary to left TKA, morbid obesity which require 3+ hours per day of interdisciplinary therapy in a comprehensive inpatient rehab setting. Physiatrist is providing close team supervision and 24 hour management of active medical problems listed below. Physiatrist and rehab team continue to assess barriers to discharge/monitor patient progress toward functional and medical goals. FIM: FIM - Bathing Bathing Steps Patient Completed: Chest;Right Arm;Left Arm;Abdomen;Front perineal area;Buttocks;Right upper leg;Left upper leg;Left lower leg (including foot);Right lower leg (including foot) Bathing: 5: Supervision: Safety issues/verbal cues  FIM - Upper Body Dressing/Undressing Upper body dressing/undressing steps patient completed: Thread/unthread right bra strap;Thread/unthread left bra strap;Hook/unhook bra;Thread/unthread right sleeve of pullover shirt/dresss;Thread/unthread left sleeve of pullover shirt/dress;Put head through opening of pull over shirt/dress;Pull shirt over trunk Upper body dressing/undressing: 5: Supervision: Safety issues/verbal cues FIM - Lower Body Dressing/Undressing Lower body dressing/undressing steps patient completed: Thread/unthread right underwear leg;Thread/unthread left underwear leg;Pull underwear up/down;Thread/unthread right pants leg;Thread/unthread left pants leg;Pull pants up/down;Don/Doff right sock;Don/Doff left sock Lower body dressing/undressing: 5: Supervision: Safety issues/verbal cues  FIM - Toileting Toileting steps completed by patient: Adjust clothing prior to toileting;Performs perineal hygiene;Adjust clothing after toileting Toileting Assistive Devices: Grab bar or rail for support Toileting: 5: Supervision: Safety issues/verbal cues  FIM - Diplomatic Services operational officer Devices: Bedside commode Toilet Transfers: 5-To toilet/BSC: Supervision (verbal cues/safety  issues);5-From toilet/BSC: Supervision (verbal cues/safety issues)  FIM - Banker Devices: Walker;Arm rests Bed/Chair Transfer: 5: Bed > Chair or W/C: Supervision (verbal cues/safety issues);5: Chair or W/C > Bed: Supervision (verbal cues/safety issues)  FIM - Locomotion: Wheelchair  Locomotion: Wheelchair: 0: Activity did not occur (pt ambulates using RW) FIM - Locomotion: Ambulation Locomotion: Ambulation Assistive Devices: Designer, industrial/product Ambulation/Gait Assistance: 5: Supervision Locomotion: Ambulation: 5: Travels 150 ft or more with supervision/safety issues  Comprehension Comprehension Mode: Auditory Comprehension: 7-Follows complex conversation/direction: With no assist  Expression Expression Mode: Verbal Expression: 7-Expresses complex ideas: With no assist  Social Interaction Social Interaction: 7-Interacts appropriately with others - No medications needed.  Problem Solving Problem Solving: 7-Solves complex problems: Recognizes & self-corrects  Memory Memory: 7-Complete Independence: No helper  1. DVT Prophylaxis/Anticoagulation: Pharmaceutical: Coumadin  2. Pain Management:  OxyContin initiated for more consistent pain relief as requiring pain medication through the night. May need premedication with IR prior to therapies.  3. Mood: Motivated to get better. Will have SW to follow up for formal evaluation.  4. ABLA: Hgb with some improvement past transfusion. Will monitor for recurrent hypotension. Check stool guaiacs to rule out GIB as therapeutic on chronic coumadin. Tachycardia noted. Added iron supplement. Recheck hgb again on  Friday. 5. OSA: Continue to encourage CPAP use when sleeping  6. Morbid obesity: Pressure relief measures. Patient adheres to CM diet to help with weight control.  7. HTN: On cardizem at HS. May need to hold BP medications if patient orthostatic. Continue to hold diovan HCTZ.  8. GERD: continue  protonix 9. Wound care- local care to blisters/skin irritation  -be careful to avoid excessive tape or tight fitting garments (skin is somewhat sensitive)     LOS (Days) 3 A FACE TO FACE EVALUATION WAS PERFORMED  Della Scrivener E 09/18/2011, 7:55 AM

## 2011-09-18 NOTE — Progress Notes (Signed)
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  INR is therapeutic today. 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 No bleeding noted.   Goal of Therapy:  INR 2-3 Monitor platelets by anticoagulation protocol: Yes   Plan:  1. Continue home regimen: 10mg  TTSa, 5mg  MWFsu 2. Daily INR for now  Akera Snowberger K. Allena Katz, PharmD, BCPS.  Clinical Pharmacist Pager [phone]. 09/18/2011 9:24 AM

## 2011-09-19 LAB — PROTIME-INR
INR: 2.85 — ABNORMAL HIGH (ref 0.00–1.49)
Prothrombin Time: 30.4 seconds — ABNORMAL HIGH (ref 11.6–15.2)

## 2011-09-19 NOTE — Progress Notes (Signed)
Physical Therapy Note  Patient Details  Name: [name] MRN: [id number] Date of Birth: 1960/10/13 Today's Date: 09/19/2011 Time: 1530-1630 (60') OEG- Leg Exercise Group Pain: patient reporting good pain management 0-2/10 throughout.   Therapeutic Exercise: (60') Nu-Step x 30' for pedaling and with periodic Left knee flexion using handles to apply self over pressure                                                Supine LE exercises including ankle pumps, Quad setting, Heel slides, AAROM Left hip abduction and SLR Assisted patient BTB and into CPM with settings at 0-75 degrees knee ROM  Group Therapy    [name] J 09/19/2011, 4:42 PM

## 2011-09-19 NOTE — Progress Notes (Signed)
Occupational Therapy Session Note  Patient Details  Name: [name] MRN: [id number] Date of Birth: 30-Oct-1960  Today's Date: 09/19/2011 Time: 1300-1400group and 7253-6644 Time Calculation (min): 60 min and 60 minutes=120 minutes  Skilled Therapeutic Interventions/Progress Updates: Toileting and dressing therapy in am with focus on all aspects of safety and indpendence for all aspects of self care in preparation for going home without assist at this point. Patient able to give approprirate input regarding self care and mode of operation established here.    PM therapy group session with focus on UE exercise and endurance training w/c level.     Therapy Documentation Precautions:  Precautions Precautions: Fall Required Braces or Orthoses: Knee Immobilizer - Left Restrictions Weight Bearing Restrictions: No LLE Weight Bearing: Weight bearing as tolerated Pain:3/10 RN gave meds prior to OT session   See FIM for current functional status  Therapy/Group: Individual Therapy and group TAG  Bud Face [facility] 09/19/2011, 4:56 PM

## 2011-09-19 NOTE — Progress Notes (Addendum)
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  INR is therapeutic today. 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 No bleeding noted.   Goal of Therapy:  INR 2-3 Monitor platelets by anticoagulation protocol: Yes   Plan:  1. Continue home regimen: 10mg  TTSa, 5mg  MWFsu 2. Change INR to MWF only  Jill Side L. Illene Bolus, PharmD, BCPS Clinical Pharmacist Pager: [phone] 09/19/2011 12:03 PM

## 2011-09-19 NOTE — Progress Notes (Signed)
Physical Therapy Session Note  Patient Details  Name: TALITHA DICARLO MRN: [id number] Date of Birth: April 14, 1961  Today's Date: 09/19/2011 Time: 1500-1530 Time Calculation (min): 30 min  Short Term Goals: Week 1:  PT Short Term Goal 1 (Week 1): =LTGs  Therapy Documentation Precautions:  Precautions Precautions: Fall Required Braces or Orthoses: Knee Immobilizer - Left Restrictions Weight Bearing Restrictions: No LLE Weight Bearing: Weight bearing as tolerated Pain: 0-2/10 Left knee  Therapeutic Activity: (15') Transfer Training sit<->stand into RW with Left knee splint, Toilet transfers S/Mod-I. Gait Training: (15') using RW x 200' S/M0d-I with Left knee splint in place.   Therapy/Group: Individual Therapy  Rex Kras 09/19/2011, 4:34 PM

## 2011-09-19 NOTE — Progress Notes (Signed)
Patient ID: [name], female   DOB: [birth year], 51 y.o.   MRN: [id number] Subjective/Complaints: Slept well. Knee pain under control- No new c/os, using CPM Other ROS items reviewed and negative  Objective: Vital Signs: Blood pressure 120/70, pulse 91, temperature 98.9 F (37.2 C), temperature source Oral, resp. rate 18, height 5\' 4"  (1.626 m), weight 125.7 kg (277 lb 1.9 oz), last menstrual period 07/26/2011, SpO2 96.00%. No results found. No results found for this basename: WBC:2,HGB:2,HCT:2,PLT:2 in the last 72 hours No results found for this basename: NA:2,K:2,CL:2,CO2:2,GLUCOSE:2,BUN:2,CREATININE:2,CALCIUM:2 in the last 72 hours CBG (last 3)  No results found for this basename: GLUCAP:3 in the last 72 hours  Wt Readings from Last 3 Encounters:  09/17/11 125.7 kg (277 lb 1.9 oz)  09/09/11 124.739 kg (275 lb)  09/09/11 124.739 kg (275 lb)    Physical Exam:  General appearance: alert, cooperative and no distress Head: Normocephalic, without obvious abnormality, atraumatic Eyes:  Ears:  Nose: Nares normal. Septum midline. Mucosa normal. No drainage or sinus tenderness. Throat: lips, mucosa, and tongue normal; teeth and gums normal Neck: no adenopathy, no carotid bruit, no JVD, supple, symmetrical, trachea midline and thyroid not enlarged, symmetric, no tenderness/mass/nodules Back: symmetric, no curvature. ROM normal. No CVA tenderness. Resp: clear to auscultation bilaterally Cardio: regular rate and rhythm, S1, S2 normal, no murmur, click, rub or gallop GI: soft, non-tender; bowel sounds normal; no masses,  no organomegaly Extremities: extremities normal, atraumatic, no cyanosis or edema Pulses: 2+ and symmetric Skin: Skin color, texture, turgor normal. No rashes or lesions Neurologic: CN exam normal . Pain inhibition weakness in left LE. No sensory changes. Cognition appropriate Incision/Wound:  Wound clean and intact without drainage. Small blister on lateral leg  (left) M/S: knee ROM about 60 degrees  Assessment/Plan: 1. Functional deficits secondary to left TKA, morbid obesity which require 3+ hours per day of interdisciplinary therapy in a comprehensive inpatient rehab setting. Physiatrist is providing close team supervision and 24 hour management of active medical problems listed below. Physiatrist and rehab team continue to assess barriers to discharge/monitor patient progress toward functional and medical goals. FIM: FIM - Bathing Bathing Steps Patient Completed: Chest;Right Arm;Left Arm;Abdomen;Front perineal area;Buttocks;Right upper leg;Left upper leg;Right lower leg (including foot);Left lower leg (including foot) Bathing: 5: Supervision: Safety issues/verbal cues  FIM - Upper Body Dressing/Undressing Upper body dressing/undressing steps patient completed: Thread/unthread right bra strap;Thread/unthread left bra strap;Hook/unhook bra;Thread/unthread right sleeve of pullover shirt/dresss;Pull shirt over trunk;Put head through opening of pull over shirt/dress;Thread/unthread left sleeve of pullover shirt/dress Upper body dressing/undressing: 5: Supervision: Safety issues/verbal cues FIM - Lower Body Dressing/Undressing Lower body dressing/undressing steps patient completed: Thread/unthread right underwear leg;Thread/unthread left underwear leg;Pull underwear up/down;Thread/unthread right pants leg;Thread/unthread left pants leg;Pull pants up/down;Don/Doff right sock;Don/Doff left sock Lower body dressing/undressing: 5: Supervision: Safety issues/verbal cues  FIM - Toileting Toileting steps completed by patient: Adjust clothing prior to toileting Toileting Assistive Devices: Grab bar or rail for support Toileting: 4: Steadying assist  FIM - Diplomatic Services operational officer Devices: Grab bars;Walker Toilet Transfers: 4-To toilet/BSC: Min A (steadying Pt. > 75%);4-From toilet/BSC: Min A (steadying Pt. > 75%)  FIM - Physiological scientist Devices: Walker;Arm rests Bed/Chair Transfer: 5: Bed > Chair or W/C: Supervision (verbal cues/safety issues);5: Chair or W/C > Bed: Supervision (verbal cues/safety issues)  FIM - Locomotion: Wheelchair Locomotion: Wheelchair: 0: Activity did not occur (pt ambulates using RW) FIM - Locomotion: Ambulation Locomotion: Ambulation Assistive Devices: Designer, industrial/product Ambulation/Gait Assistance: 5:  Supervision Locomotion: Ambulation: 5: Travels 150 ft or more with supervision/safety issues  Comprehension Comprehension Mode: Auditory Comprehension: 7-Follows complex conversation/direction: With no assist  Expression Expression Mode: Verbal Expression: 7-Expresses complex ideas: With no assist  Social Interaction Social Interaction: 7-Interacts appropriately with others - No medications needed.  Problem Solving Problem Solving: 7-Solves complex problems: Recognizes & self-corrects  Memory Memory: 7-Complete Independence: No helper  1. DVT Prophylaxis/Anticoagulation: Pharmaceutical: Coumadin  2. Pain Management:  OxyContin initiated for more consistent pain relief as requiring pain medication through the night. May need premedication with IR prior to therapies.  3. Mood: Motivated to get better. Will have SW to follow up for formal evaluation.  4. ABLA: Hgb with some improvement past transfusion. Will monitor for recurrent hypotension. Check stool guaiacs to rule out GIB as therapeutic on chronic coumadin. Tachycardia noted. Added iron supplement. Recheck hgb again on  Friday. 5. OSA: Continue to encourage CPAP use when sleeping  6. Morbid obesity: Pressure relief measures. Patient adheres to CM diet to help with weight control.  7. HTN: On cardizem at HS. May need to hold BP medications if patient orthostatic. Continue to hold diovan HCTZ.  8. GERD: continue protonix 9. Wound care- local care to blisters/skin irritation  -be careful to avoid  excessive tape or tight fitting garments (skin is somewhat sensitive)     LOS (Days) 4 A FACE TO FACE EVALUATION WAS PERFORMED  Renaldo Gornick E 09/19/2011, 9:42 AM

## 2011-09-19 NOTE — Progress Notes (Signed)
Orthopedic Tech Progress Note Patient Details:  [name] 01-27-1961 [id number]  Put pt in cpm at 8:30am o-[id number]  [name] 09/19/2011, 9:58 AM

## 2011-09-20 NOTE — Progress Notes (Signed)
Patient ID: [name], female   DOB: 06-02-1960, 51 y.o.   MRN: [id number] Subjective/Complaints: Slept well. Knee pain under control- No new c/os, using CPM, bowels doing ok Other ROS items reviewed and negative  Objective: Vital Signs: Blood pressure 115/75, pulse 90, temperature 98.6 F (37 C), temperature source Oral, resp. rate 16, height 5\' 4"  (1.626 m), weight 125.7 kg (277 lb 1.9 oz), last menstrual period 07/26/2011, SpO2 100.00%. No results found. No results found for this basename: WBC:2,HGB:2,HCT:2,PLT:2 in the last 72 hours No results found for this basename: NA:2,K:2,CL:2,CO2:2,GLUCOSE:2,BUN:2,CREATININE:2,CALCIUM:2 in the last 72 hours CBG (last 3)  No results found for this basename: GLUCAP:3 in the last 72 hours  Wt Readings from Last 3 Encounters:  09/17/11 125.7 kg (277 lb 1.9 oz)  09/09/11 124.739 kg (275 lb)  09/09/11 124.739 kg (275 lb)    Physical Exam:  General appearance: alert, cooperative and no distress Head: Normocephalic, without obvious abnormality, atraumatic Eyes:  Ears:  Nose: Nares normal. Septum midline. Mucosa normal. No drainage or sinus tenderness. Throat: lips, mucosa, and tongue normal; teeth and gums normal Neck: no adenopathy, no carotid bruit, no JVD, supple, symmetrical, trachea midline and thyroid not enlarged, symmetric, no tenderness/mass/nodules Back: symmetric, no curvature. ROM normal. No CVA tenderness. Resp: clear to auscultation bilaterally Cardio: regular rate and rhythm, S1, S2 normal, no murmur, click, rub or gallop GI: soft, non-tender; bowel sounds normal; no masses,  no organomegaly Extremities: extremities normal, atraumatic, no cyanosis or edema Pulses: 2+ and symmetric Skin: Skin color, texture, turgor normal. No rashes or lesions Neurologic: CN exam normal . Pain inhibition weakness in left LE. No sensory changes. Cognition appropriate Incision/Wound:  Wound clean and intact without drainage. Small blister on  lateral leg (left) M/S: knee ROM about 60 degrees  Assessment/Plan: 1. Functional deficits secondary to left TKA, morbid obesity which require 3+ hours per day of interdisciplinary therapy in a comprehensive inpatient rehab setting. Physiatrist is providing close team supervision and 24 hour management of active medical problems listed below. Physiatrist and rehab team continue to assess barriers to discharge/monitor patient progress toward functional and medical goals. FIM: FIM - Bathing Bathing Steps Patient Completed: Chest;Right Arm;Left Arm;Abdomen;Front perineal area;Buttocks;Right upper leg;Left upper leg;Right lower leg (including foot);Left lower leg (including foot) Bathing: 5: Supervision: Safety issues/verbal cues  FIM - Upper Body Dressing/Undressing Upper body dressing/undressing steps patient completed: Thread/unthread right bra strap;Thread/unthread left bra strap;Hook/unhook bra;Thread/unthread right sleeve of pullover shirt/dresss;Pull shirt over trunk;Put head through opening of pull over shirt/dress;Thread/unthread left sleeve of pullover shirt/dress Upper body dressing/undressing: 5: Set-up assist to: Obtain clothing/put away FIM - Lower Body Dressing/Undressing Lower body dressing/undressing steps patient completed: Thread/unthread right underwear leg;Thread/unthread left underwear leg;Pull underwear up/down;Thread/unthread right pants leg;Don/Doff left sock;Don/Doff right sock;Pull pants up/down;Thread/unthread left pants leg (wore no shoes today) Lower body dressing/undressing: 5: Supervision: Safety issues/verbal cues  FIM - Toileting Toileting steps completed by patient: Adjust clothing prior to toileting;Performs perineal hygiene;Adjust clothing after toileting Toileting Assistive Devices: Grab bar or rail for support Toileting: 5: Supervision: Safety issues/verbal cues  FIM - Diplomatic Services operational officer Devices: Elevated toilet seat;Grab  bars;Walker Toilet Transfers: 5-To toilet/BSC: Supervision (verbal cues/safety issues);5-From toilet/BSC: Supervision (verbal cues/safety issues)  FIM - Banker Devices: Walker;Arm rests Bed/Chair Transfer: 3: Chair or W/C > Bed: Mod A (lift or lower assist)  FIM - Locomotion: Wheelchair Locomotion: Wheelchair: 0: Activity did not occur (pt ambulates using RW) FIM - Locomotion: Ambulation Locomotion: Ambulation Assistive  Devices: Designer, industrial/product Ambulation/Gait Assistance: 5: Supervision Locomotion: Ambulation: 5: Travels 150 ft or more with supervision/safety issues  Comprehension Comprehension Mode: Auditory Comprehension: 7-Follows complex conversation/direction: With no assist  Expression Expression Mode: Verbal Expression: 7-Expresses complex ideas: With no assist  Social Interaction Social Interaction: 7-Interacts appropriately with others - No medications needed.  Problem Solving Problem Solving: 7-Solves complex problems: Recognizes & self-corrects  Memory Memory: 7-Complete Independence: No helper  1. DVT Prophylaxis/Anticoagulation: Pharmaceutical: Coumadin  2. Pain Management:  OxyContin initiated for more consistent pain relief as requiring pain medication through the night. May need premedication with IR prior to therapies.  3. Mood: Motivated to get better. Will have SW to follow up for formal evaluation.  4. ABLA: Hgb with some improvement past transfusion. Will monitor for recurrent hypotension. Check stool guaiacs to rule out GIB as therapeutic on chronic coumadin. Tachycardia noted. Added iron supplement. Recheck hgb again on  Friday. 5. OSA: Continue to encourage CPAP use when sleeping  6. Morbid obesity: Pressure relief measures. Patient adheres to CM diet to help with weight control.  7. HTN: On cardizem at HS. May need to hold BP medications if patient orthostatic. Continue to hold diovan HCTZ.  8. GERD: continue  protonix 9. Wound care- local care to blisters/skin irritation  -be careful to avoid excessive tape or tight fitting garments (skin is somewhat sensitive)     LOS (Days) 5 A FACE TO FACE EVALUATION WAS PERFORMED  Dula Havlik E 09/20/2011, 8:27 AM

## 2011-09-20 NOTE — Progress Notes (Signed)
Physical Therapy Note  Patient Details  Name: SHARENE KRIKORIAN MRN: [id number] Date of Birth: 04-Nov-1960 Today's Date: 09/20/2011  1100-1155 (55 minutes) group Pain: 2/10 left knee/premedicated  Pt participated in PT group session focusing on gait training,endurance and safety. Treatment: Nustep (ergonometer) Level 4 X 10 minutes for general strengthening/AROM left knee; sit to supine min assist Lt LE (mat) ; AA SLR 2 X 10 on left; passive hamstring stretch in supine ; gait 80 feet RW SBA.   Tejal Monroy,JIM 09/20/2011, 12:35 PM

## 2011-09-21 LAB — PROTIME-INR
INR: 3.01 — ABNORMAL HIGH (ref 0.00–1.49)
Prothrombin Time: 31.7 seconds — ABNORMAL HIGH (ref 11.6–15.2)

## 2011-09-21 NOTE — Progress Notes (Signed)
Physical Therapy Session Note  Patient Details  Name: SUMAN TRIVEDI MRN: [id number] Date of Birth: 28-Dec-1960  Today's Date: 09/21/2011 Time: 8413-2440 Time Calculation (min): 52 min  Short Term Goals: Week 1:  PT Short Term Goal 1 (Week 1): =LTGs  Skilled Therapeutic Interventions/Progress Updates:   See below for details.   Therapy Documentation Precautions:  Precautions Precautions: Fall LLE Weight Bearing: Weight bearing as tolerated Pain: Pain Assessment Pain Assessment: No/denies pain Pain Score: 0-No pain Pt would complain of some pain with some of the exercises but it would subside when exercises stopped. Mobility:  Mat mobility and all transfers with supervision. Locomotion :   Gait with RW 80' x 2 and 150 with supervision without KI.  Curb type step with RW x 2 with supervision. Exercises:  NuStep x 5 min for ROM and strengthening.  Supine and sitting TKR exercises 20 reps each with supervision.  Pt able to perform SLR x 10 reps.  Knee flexion at 85, pt doe not have full extension on the left approx -15 degrees.  Note she does not have full extension on the right knee. See FIM for current functional status  Therapy/Group: Individual Therapy  Georges Mouse 09/21/2011, 12:01 PM

## 2011-09-21 NOTE — Progress Notes (Signed)
Occupational Therapy Note  Patient Details  Name: [name] MRN: [id number] Date of Birth: 1960/08/02 Today's Date: 09/21/2011  Session 1 Time: 1100-1200 Pt denies pain Individual Therapy Pt completed bathing and dressing tasks w/c level at sink with supervision.  Pt ambulated with r/w to bathroom for toileting with mod I and returned to w/c at sink to complete ADLs.  Discussed with patient recommendation for d/c home on 6/4 after AM therapies.  Pt agreeable.  Focus on safety awareness, activity tolerance, and functional amb with r/w to complete BADLs.  Session 2 Time: 1300-1345 Pt denies pain Individual Therapy Pt amb with r/w in kitchen to complete simple meal prep activity, resting occasionally on bar stool with increased height.  Pt states she has a similar stool at home and uses it extensively when in kitchen.  Pt practiced tub transfers using tub transfer bench.  Pt performs task at supervision level.  Pt performed home management tasks in ADL apartment at mod I level. Focus on safety awareness and energy conservation strategies,  Rich Brave 09/21/2011, 3:18 PM

## 2011-09-21 NOTE — Progress Notes (Signed)
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  INR at upper threshold of therapeutic @ 3.01. 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 No bleeding noted.  Last CBC 5/29 - hgb, plts ok.    Goal of Therapy:  INR 2-3 Monitor platelets by anticoagulation protocol: Yes   Plan:  1. Continue home regimen: 10mg  TTSa, 5mg  MWFsu 2. Change INR to MWF only  Haynes Hoehn, PharmD 09/21/2011 12:58 PM  Pager: [phone]

## 2011-09-21 NOTE — Progress Notes (Signed)
Physical Therapy Note  Patient Details  Name: [name] MRN: [id number] Date of Birth: [birth year] Today's Date: 09/21/2011  1400-1500 (60 minutes) group Pain: no complaint of pain Pt participated in PT group session focused on gait safety/endurance/training. Pt ambulates room>< gym RW distant supervision 150 feet X 2; pt participated in standing tolerance activity without AD (ball toss) with word recall activity. Pt tolerated standing X 2 for 15 minutes.   Vandell Kun,JIM 09/21/2011, 3:11 PM

## 2011-09-21 NOTE — Progress Notes (Signed)
Patient ID: [name], female   DOB: [birth year], 51 y.o.   MRN: [id number] Patient ID: [name], female   DOB: 1961/01/18, 51 y.o.   MRN: [id number] Subjective/Complaints: Slept well again. Knee a little tight. Excited to go home. Other ROS items reviewed and negative  Objective: Vital Signs: Blood pressure 138/83, pulse 104, temperature 98.8 F (37.1 C), temperature source Oral, resp. rate 18, height 5\' 4"  (1.626 m), weight 125.7 kg (277 lb 1.9 oz), last menstrual period 07/26/2011, SpO2 96.00%. No results found. No results found for this basename: WBC:2,HGB:2,HCT:2,PLT:2 in the last 72 hours No results found for this basename: NA:2,K:2,CL:2,CO2:2,GLUCOSE:2,BUN:2,CREATININE:2,CALCIUM:2 in the last 72 hours CBG (last 3)  No results found for this basename: GLUCAP:3 in the last 72 hours  Wt Readings from Last 3 Encounters:  09/17/11 125.7 kg (277 lb 1.9 oz)  09/09/11 124.739 kg (275 lb)  09/09/11 124.739 kg (275 lb)    Physical Exam:  General appearance: alert, cooperative and no distress Head: Normocephalic, without obvious abnormality, atraumatic Eyes:  Ears:  Nose: Nares normal. Septum midline. Mucosa normal. No drainage or sinus tenderness. Throat: lips, mucosa, and tongue normal; teeth and gums normal Neck: no adenopathy, no carotid bruit, no JVD, supple, symmetrical, trachea midline and thyroid not enlarged, symmetric, no tenderness/mass/nodules Back: symmetric, no curvature. ROM normal. No CVA tenderness. Resp: clear to auscultation bilaterally Cardio: regular rate and rhythm, S1, S2 normal, no murmur, click, rub or gallop GI: soft, non-tender; bowel sounds normal; no masses,  no organomegaly Extremities: extremities normal, atraumatic, no cyanosis or edema Pulses: 2+ and symmetric Skin: Skin color, texture, turgor normal. No rashes or lesions Neurologic: CN exam normal . Pain inhibition weakness in left LE. No sensory changes. Cognition  appropriate Incision/Wound:  Wound clean and intact without drainage. Small blister on lateral leg (left) M/S: knee ROM about 90 degrees actively this am.  Assessment/Plan: 1. Functional deficits secondary to left TKA, morbid obesity which require 3+ hours per day of interdisciplinary therapy in a comprehensive inpatient rehab setting. Physiatrist is providing close team supervision and 24 hour management of active medical problems listed below. Physiatrist and rehab team continue to assess barriers to discharge/monitor patient progress toward functional and medical goals.  No longer needs KI.  Ready for DC in the am tomorrow. FIM: FIM - Bathing Bathing Steps Patient Completed: Chest;Right Arm;Left Arm;Abdomen;Front perineal area;Buttocks;Right upper leg;Left upper leg;Right lower leg (including foot);Left lower leg (including foot) Bathing: 5: Supervision: Safety issues/verbal cues  FIM - Upper Body Dressing/Undressing Upper body dressing/undressing steps patient completed: Thread/unthread right bra strap;Thread/unthread left bra strap;Hook/unhook bra;Thread/unthread right sleeve of pullover shirt/dresss;Pull shirt over trunk;Put head through opening of pull over shirt/dress;Thread/unthread left sleeve of pullover shirt/dress Upper body dressing/undressing: 5: Set-up assist to: Obtain clothing/put away FIM - Lower Body Dressing/Undressing Lower body dressing/undressing steps patient completed: Thread/unthread right underwear leg;Thread/unthread left underwear leg;Pull underwear up/down;Thread/unthread right pants leg;Don/Doff left sock;Don/Doff right sock;Pull pants up/down;Thread/unthread left pants leg (wore no shoes today) Lower body dressing/undressing: 5: Supervision: Safety issues/verbal cues  FIM - Toileting Toileting steps completed by patient: Adjust clothing prior to toileting;Performs perineal hygiene;Adjust clothing after toileting Toileting Assistive Devices: Grab bar or rail  for support Toileting: 5: Supervision: Safety issues/verbal cues  FIM - Diplomatic Services operational officer Devices: Elevated toilet seat;Grab bars;Walker Toilet Transfers: 5-To toilet/BSC: Supervision (verbal cues/safety issues);5-From toilet/BSC: Supervision (verbal cues/safety issues)  FIM - Banker Devices: Walker;Arm rests Bed/Chair Transfer: 3: Chair or W/C > Bed:  Mod A (lift or lower assist)  FIM - Locomotion: Wheelchair Locomotion: Wheelchair: 0: Activity did not occur (pt ambulates using RW) FIM - Locomotion: Ambulation Locomotion: Ambulation Assistive Devices: Designer, industrial/product Ambulation/Gait Assistance: 5: Supervision Locomotion: Ambulation: 5: Travels 150 ft or more with supervision/safety issues  Comprehension Comprehension Mode: Auditory Comprehension: 7-Follows complex conversation/direction: With no assist  Expression Expression Mode: Verbal Expression: 7-Expresses complex ideas: With no assist  Social Interaction Social Interaction: 6-Interacts appropriately with others with medication or extra time (anti-anxiety, antidepressant).  Problem Solving Problem Solving: 7-Solves complex problems: Recognizes & self-corrects  Memory Memory: 7-Complete Independence: No helper  1. DVT Prophylaxis/Anticoagulation: Pharmaceutical: Coumadin  2. Pain Management:  OxyContin and oxycodone 3. Mood: Motivated to get better. Will have SW to follow up for formal evaluation.  4. ABLA: Hgb with some improvement past transfusion. No active signs of bleeding.  5. OSA: Continue to encourage CPAP use when sleeping  6. Morbid obesity: Pressure relief measures. Patient adheres to CM diet to help with weight control.  7. HTN: On cardizem at HS. May need to hold BP medications if patient orthostatic. Continue to hold diovan HCTZ.  8. GERD: continue protonix 9. Wound care- local care to blisters/skin irritation  -be careful to avoid  excessive tape or tight fitting garments (skin is somewhat sensitive)     LOS (Days) 6 A FACE TO FACE EVALUATION WAS PERFORMED  [name] T 09/21/2011, 12:03 PM

## 2011-09-21 NOTE — Progress Notes (Signed)
Recreational Therapy Discharge Summary Patient Details  Name: Memphis Creswell Zartman MRN: [id number] Date of Birth: 21-Oct-1960 Today's Date: 09/21/2011 Time:  1415-1430 Pain: no c/o  Session Notes:  Pt stood for ball toss/word game with Mod I.  Pt able to safety recognize need for rest.  Long term goals set: 1  Long term goals met: 1  Comments on progress toward goals: Pt has made excellent progress toward goal and is ready for discharge home at Mod I level.  Pt requires extra time and use of AE to complete tasks safely.    Reasons for discharge: discharge from hospital  Patient/family agrees with progress made and goals achieved: Yes  Lavonia Eager 09/21/2011, 4:32 PM

## 2011-09-22 MED ORDER — WARFARIN SODIUM 10 MG PO TABS: ORAL_TABLET | ORAL | Status: DC

## 2011-09-22 MED ORDER — POLYSACCHARIDE IRON COMPLEX 150 MG PO CAPS: 150.0000 mg | ORAL_CAPSULE | Freq: Two times a day (BID) | ORAL | Status: DC

## 2011-09-22 MED ORDER — CIPROFLOXACIN HCL 250 MG PO TABS
250.0000 mg | ORAL_TABLET | Freq: Two times a day (BID) | ORAL | Status: DC
  Administered 2011-09-22: 250 mg via ORAL
  Filled 2011-09-22 (×3): qty 1

## 2011-09-22 MED ORDER — DSS 100 MG PO CAPS: 100.0000 mg | ORAL_CAPSULE | Freq: Two times a day (BID) | ORAL | Status: AC

## 2011-09-22 MED ORDER — BENEPROTEIN PO POWD: 1.0000 | Freq: Three times a day (TID) | ORAL | Status: DC

## 2011-09-22 MED ORDER — METHOCARBAMOL 500 MG PO TABS: 500.0000 mg | ORAL_TABLET | Freq: Three times a day (TID) | ORAL | Status: DC | PRN

## 2011-09-22 MED ORDER — OXYCODONE-ACETAMINOPHEN 5-325 MG PO TABS: 1.0000 | ORAL_TABLET | Freq: Four times a day (QID) | ORAL | Status: AC | PRN

## 2011-09-22 MED ORDER — OXYCODONE HCL 10 MG PO TB12: 10.0000 mg | ORAL_TABLET | Freq: Two times a day (BID) | ORAL | Status: DC

## 2011-09-22 MED ORDER — CIPROFLOXACIN HCL 250 MG PO TABS: 250.0000 mg | ORAL_TABLET | Freq: Two times a day (BID) | ORAL | Status: AC

## 2011-09-22 NOTE — Progress Notes (Signed)
Social Work  Discharge Note  The overall goal for the admission was met for:   Discharge location: Yes - home with sister to stay x several weeks  Length of Stay: Yes - 7 days   Discharge activity level: Yes - modified independent  Home/community participation: Yes  Services provided included: MD, RD, PT, OT, RN, CM, TR, Pharmacy and SW  Financial Services: Private Insurance: [employer]  Follow-up services arranged: Outpatient: PT via [facility] ([address] location)  Comments (or additional information):  Patient/Family verbalized understanding of follow-up arrangements: Yes  Individual responsible for coordination of the follow-up plan: patient  Confirmed correct DME delivered: NA  Donelda Mailhot

## 2011-09-22 NOTE — Discharge Summary (Signed)
[name], [name]               ACCOUNT NO.:  [id number]  MEDICAL RECORD NO.:  [id number]  LOCATION:  4144                         FACILITY:  [facility]  PHYSICIAN:  Ranelle Oyster, M.D.DATE OF BIRTH:  1960/05/16  DATE OF ADMISSION:  09/15/2011 DATE OF DISCHARGE:  09/22/2011                              DISCHARGE SUMMARY   DISCHARGE DIAGNOSES: 1. End-stage degenerative joint disease-left knee. 2. Morbid obesity. 3. Acute blood loss anemia. 4. Hypertension. 5. Frequency, urgency.  HISTORY OF PRESENT ILLNESS:  [name] is a 51 year old female with history of morbid obesity, end-stage DJD, left knee who elected to undergo left total knee replacement May 23 by Dr. Shelle Iron.  Postop is weightbearing as tolerated and Coumadin resumed.  Therapies were initiated and the patient was noted to be deconditioned requiring assistance with all mobility and ADLs and rehab was consulted for progressive therapies.  PAST MEDICAL HISTORY:  Positive for hypertension, arthritis, asthma, morbid obesity, history of PE, DVT, GERD, dysfunctional uterine bleeding, headaches, sleep apnea, glaucoma.  FUNCTIONAL HISTORY:  The patient was independent prior to admission. She works at [employer].  Was using a walker prior to admission.  FUNCTIONAL STATUS:  The patient is min to mod assist for bed mobility. Min assist for sit to stand, min assist for ambulating 75 feet x2 with rolling walker, requiring min cues for posture and pacing.  She was modified independent for grooming, required min assist for upper body bathing and dressing, max assist for lower body care and +2 total assist for toileting.  HOSPITAL COURSE:  [name] was admitted to rehab on Sep 15, 2011 for inpatient therapies to consist of PT, OT at least 3 hours 5 days a week.  Past admission physiatrist, rehab RN, and therapy team have worked together to provide customized collaborative interdisciplinary care.  Rehab RN has worked  with the patient on bowel and bladder program as well as assisted in pain management with administration of pain medicine.  The patient's blood pressures were checked on b.i.d. basis.  Her Diovan HCTZ was held during this stay. Blood pressures in the last 24 hours have ranged from 120s-130s systolics, 70s-80s diastolic.  The patient's p.o. intake has been good. She has been continent of bowel and bladder.  Followup labs were done past admission and her H and H was noted to be stable at 8.5 and 25.1. She continues on iron supplements, which are to continue at time of discharge.  Check of lytes revealed sodium 138, potassium 4.0, chloride 103, CO2 22, BUN 10, creatinine 0.92, glucose 93.  UA/UC was done at admission.  Urine culture showed 10,000 colonies of klebsiella pneumoniae, however, as the patient was asymptomatic and afebrile,  she was monitored long.  Evening prior to discharge, the patient reported having increased frequency and urgency.  A repeat urine culture was done.  The patient was started on Cipro and is to continue on this for now.  We will follow up with the patient regarding results of urine culture.  The patient's pro times have been monitored with Coumadin dose adjustment per pharmacy protocol.  PT/INR at time of discharge at 31.7 and 3.01 and the patient is discharged  on 10 mg twice a week and 5 mg all other days.  During the patient's stay in rehab, weekly team conferences were held to monitor the patient's progress, set goals, as well as discuss barriers to discharge.  Physical therapy has been working with the patient on mobility as well as strengthening.  They have worked on activity tolerance, balance, strengthening, as well as improving range of motion.  The patient has progressed to being at modified independent level for transfers and mobility.  She is able to ambulate 200 feet at modified Independent level.  She's at supervision for stair navigation.  OT has  been working with the patient on self-care tasks.  Currently,  the patient is independent for bathing and dressing. She is able to  perform simple meal planning preparation activity at supervision level.  Further followup outpatient physical therapy to continue at [facility] outpatient rehab past discharge.  Next pro time to be drawn on Friday at Dr. Tedra Senegal office.  On September 22, 2011, the patient is discharged to home.  DISCHARGE MEDICATIONS: 1. Cipro 250 mg b.i.d. 2. Colace 100 mg b.i.d. 3. Niferex 150 mg b.i.d. 4. OxyContin 10 mg, #30 RX, 1 p.o. q.12 hours x10 days, and then decrease     to 1 p.o. per day till gone.  5. Percocet 5/325, #90 RX, 1-2 p.o. q.6 hours p.r.n. moderate-to-severe     pain. 6. Protein supplements t.i.d. 7. Robaxin 500 mg p.o. t.i.d. p.r.n. spasms. 8. Coumadin 10 mg twice on mondays and saturdays. And 5 mg all  other days.  9. B complex vitamins daily. 10.Lumigan 0.01%, 1 drop left eye at bedtime. 11.Alphagan 0.2%, 1 drop both eyes t.i.d. 12.Azopt 1%, 1 drop, left eye t.i.d. 13.Cyanocobalamin 2500 mcg per day. 14.Flexeril 10 mg at bedtime p.r.n. 15.Dilacor XR 240 mg per day. 16.Nexium 40 mg a day. 17.Allegra 180 mg a day. 18.Omega-3 pills daily. 19.Neurontin 100 mg a day. 20.Slow-Mag 1-2 daily. 21.Multivitamin 2 at bedtime. 22.Niacin 500 mg daily with breakfast. 23.Alvesco 1 puff b.i.d. 24.Daliresp 1 at bedtime. 25.Quinine 324 mg at bedtime as needed. 26.Probiotic daily. 27.Systane eye drops, 1 drop in right eye t.i.d.  DIET:  Regular.  ACTIVITY LEVEL:  At intermittent supervision with use of walker.  SPECIAL INSTRUCTIONS:  To not use alcohol. Use walker for mobility. Outpatient PT to begin at [facility] outpatient rehab on June 11th at 10 a.m. No driving until cleared by Dr. Shelle Iron.  FOLLOWUP:  The patient to follow up with Dr. Parke Simmers in the next few weeks for check of blood pressure, CBC check and to get input on resuming Diovan HCTZ. Follow up with  Dr. Shelle Iron tomorrow for postop check.   Follow up with Dr. Riley Kill as needed.     Delle Reining, P.A.   ______________________________ Ranelle Oyster, M.D.    PL/MEDQ  D:  09/22/2011  T:  09/22/2011  Job:  [id number]  cc:   [name], M.D. Jene Every, M.D.

## 2011-09-22 NOTE — Progress Notes (Signed)
Discharge summary [phone]

## 2011-09-22 NOTE — Progress Notes (Signed)
Physical Therapy Discharge Summary  Patient Details  Name: [name] MRN: [id number] Date of Birth: April 20, 1961  Today's Date: 09/22/2011 Time: 6295-2841 Time Calculation (min): 48 min  Patient has met 8 of 8 long term goals due to improved activity tolerance, improved balance, increased strength, increased range of motion, decreased pain and ability to compensate for deficits.  Patient to discharge at an ambulatory level modified independent/ Supervision.   Patient's care partner is independent to provide the necessary physical assistance at discharge.  Reasons goals not met: NA   Recommendation:  Patient will benefit from ongoing skilled PT services in outpatient setting to continue to advance safe functional mobility, address ongoing impairments in decreased functional mobility, increased need for assist, decreased ROM, decreased strength, and minimize fall risk.  Equipment: None  Reasons for discharge: treatment goals met and discharge from hospital  Patient/family agrees with progress made and goals achieved: Yes  PT Discharge Precautions/Restrictions Restrictions Weight Bearing Restrictions: No LLE Weight Bearing: Weight bearing as tolerated Pain Pain Assessment Pain Assessment: 0-10 Pain Score:   5 Pain Type: Acute pain Pain Location: Knee Pain Orientation: Left Pain Descriptors: Aching Pain Frequency: Occasional Pain Onset: Gradual RN made aware, pt's desire to get ice pack Vision/Perception  Vision - History Baseline Vision: Wears glasses all the time Patient Visual Report: No change from baseline Vision - Assessment Eye Alignment: Within Functional Limits Perception Perception: Within Functional Limits Praxis Praxis: Intact  Cognition Overall Cognitive Status: Appears within functional limits for tasks assessed Arousal/Alertness: Awake/alert Orientation Level: Oriented X4 Memory: Appears intact Awareness: Appears intact Problem Solving: Appears  intact Safety/Judgment: Appears intact Sensation Sensation Light Touch: Appears Intact Stereognosis: Appears Intact Hot/Cold: Appears Intact Proprioception: Appears Intact Coordination Gross Motor Movements are Fluid and Coordinated: Yes Fine Motor Movements are Fluid and Coordinated: Yes Motor  Motor Motor: Within Functional Limits  Mobility Bed Mobility Bed Mobility: Rolling Right;Rolling Left Rolling Right: 6: Modified independent (Device/Increase time) Rolling Left: 6: Modified independent (Device/Increase time) Supine to Sit: 6: Modified independent (Device/Increase time) Sit to Supine: 6: Modified independent (Device/Increase time) Transfers Sit to Stand: 6: Modified independent (Device/Increase time) Stand to Sit: 6: Modified independent (Device/Increase time) Locomotion  Ambulation Ambulation/Gait Assistance: 6: Modified independent (Device/Increase time);5: Supervision (pending environment) Ambulation Distance (Feet): 200 Feet (total) Assistive device: Rolling walker Ambulation/Gait Assistance Details: Verbal cues for heel strike initially, pt continues to work on this.  Gait Gait: Yes Gait Pattern: Impaired Gait Pattern: Decreased step length - left;Decreased stance time - right;Left flexed knee in stance;Left foot flat High Level Ambulation High Level Ambulation: Side stepping;Backwards walking;Direction changes Stairs / Additional Locomotion Stairs: Yes Stairs Assistance: 5: Supervision Stairs Assistance Details (indicate cue type and reason): Demonstrated performance with pt's sister providing supervision, pt then performed 3 x with sister providing supervision. Cues for positioning and safety.  Stair Management Technique: No rails;Backwards;With walker Number of Stairs: 3  Curb: 5: Supervision  Trunk/Postural Assessment  Cervical Assessment Cervical Assessment: Within Functional Limits Thoracic Assessment Thoracic Assessment: Within Functional  Limits Lumbar Assessment Lumbar Assessment: Within Functional Limits Postural Control Postural Control: Within Functional Limits  Balance Static Sitting Balance Static Sitting - Level of Assistance: 7: Independent Dynamic Sitting Balance Dynamic Sitting - Level of Assistance: 7: Independent Extremity Assessment  RUE Assessment RUE Assessment: Within Functional Limits LUE Assessment LUE Assessment: Within Functional Limits RLE Assessment RLE Assessment: Within Functional Limits (AROM 9-110 deg limited d/t adipose tissue) LLE Assessment LLE Assessment: Exceptions to WFL (AROM 15-75 deg knee flex)  See FIM for current functional status  Wilhemina Bonito 09/22/2011, 12:39 PM

## 2011-09-22 NOTE — Discharge Instructions (Signed)
Inpatient Rehab Discharge Instructions  [name] Discharge date and time: 09/22/11   Activities/Precautions/ Functional Status: Activity: activity as tolerated Diet: regular diet Wound Care: keep wound clean and dry Functional status:  ___ No restrictions     ___ Walk up steps independently ___ 24/7 supervision/assistance   ___ Walk up steps with assistance ___ Intermittent supervision/assistance  ___ Bathe/dress independently _X__ Walk with walker    ___ Bathe/dress with assistance ___ Walk Independently    ___ Shower independently ___ Walk with assistance    ___ Shower with assistance _X__ No alcohol     ___ Return to work/school ________  COMMUNITY REFERRALS UPON DISCHARGE:    Outpatient: PT via [facility] [address] [phone]               Appointment Date/Time: 09/29/11 @ 10:00 am (arrive 9:45)    Special Instructions: No driving till cleared by Dr. Shelle Iron.   My questions have been answered and I understand these instructions. I will adhere to these goals and the provided educational materials after my discharge from the hospital.  Patient/Caregiver Signature _______________________________ Date __________  Clinician Signature _______________________________________ Date __________  Please bring this form and your medication list with you to all your follow-up doctor's appointments.

## 2011-09-22 NOTE — Patient Care Conference (Signed)
Inpatient RehabilitationTeam Conference Note Date: 09/22/2011   Time: 10:00 AM    Patient Name: [name]      Medical Record Number: [id number]  Date of Birth: 02/14/1961 Sex: Female         Room/Bed: 4144/4144-01 Payor Info: Payor: BLUE [employer]UE [employer]  Plan: [facility] PPO  Product Type: *No Product type*     Admitting Diagnosis: LT TKR  Admit Date/Time:  09/15/2011  1:00 PM Admission Comments: No comment available   Primary Diagnosis:  Total knee replacement status, left Principal Problem: Total knee replacement status, left  Patient Active Problem List  Diagnoses Date Noted  . Anemia 09/10/2011  . ARF (acute renal failure) 09/10/2011  . Paresthesias 09/10/2011  . Total knee replacement status, left 09/10/2011  . Cervical spondylosis with myelopathy 06/30/2011  . Osteoarthritis of both knees 06/30/2011  . Diabetes mellitus   . Arthritis   . Sleep apnea, obstructive   . Morbid obesity   . Dysfunctional uterine bleeding   . HEPATITIS C 01/25/2008  . HYPERTENSION 01/25/2008  . 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 ASTHMA 01/25/2008  . SLEEP APNEA 01/25/2008  . DYSPNEA 01/25/2008    Expected Discharge Date: Expected Discharge Date: 09/22/11  Team Members Present: Physician: Dr. Faith Rogue Case Manager Present: Melanee Spry, RN Social Worker Present: Amada Jupiter, LCSW Nurse Present: Daryll Brod, RN PT Present: Karolee Stamps, PT OT Present: Edwin Cap, Felipa Eth, OT     Current Status/Progress Goal Weekly Team Focus  Medical   has made goals. knee rom improved.  outpt therapy set up  finalizing dc planning   Bowel/Bladder   continent bowel/bladder, lbm 6/2  remain continent  remain continent   Swallow/Nutrition/ Hydration             ADL's   mod I overall  mod I overall  activity toletance   Mobility     mod I   mod I     Communication             Safety/Cognition/ Behavioral Observations            Pain   complaints of left knee  incisional pain, managed with current prn and scheduled pain medications  pain managed with prn and scheduled medications  monitor   Skin   incision to left knee with steri strips and dry gauze dressing changed 6/3 clean dry intact, blister to left lower leg, from cpm machine with tegaderm changed 6/3 clean dry intact  no new breakdown   monitor incision and current skin breakdown      *See Interdisciplinary Assessment and Plan and progress notes for long and short-term goals  Barriers to Discharge: none    Possible Resolutions to Barriers:  none    Discharge Planning/Teaching Needs:  Home with sister to stay for several weeks - mod i goals      Team Discussion: family ed completed--pt ready for d/c today.   Revisions to Treatment Plan: none    Continued Need for Acute Rehabilitation Level of Care: The patient requires daily medical management by a physician with specialized training in physical medicine and rehabilitation for the following conditions: Daily direction of a multidisciplinary physical rehabilitation program to ensure safe treatment while eliciting the highest outcome that is of practical value to the patient.: Yes Daily medical management of patient stability for increased activity during participation in an intensive rehabilitation regime.: Yes Daily analysis of laboratory values and/or radiology reports with  any subsequent need for medication adjustment of medical intervention for : Post surgical problems;Other  Brock Ra 09/22/2011, 3:44 PM

## 2011-09-22 NOTE — Progress Notes (Signed)
Patient discharged aprox 1300 with sister. Reviewed discharge teachings and reinforced education. No further questions at this time.

## 2011-09-22 NOTE — Progress Notes (Signed)
Pt. Stated that she would put CPAP on herself when ready for bed. CPAP is set up at pt.'s bedside. Pt. Was made aware to call RT if she needed assistance.

## 2011-09-22 NOTE — Progress Notes (Signed)
Pharmacy - coumadin  Pt had been changed to only checking INRs on Mon + Wed + Fri since she has been stable on her home dose. However, with the start of cipro we will go back to checking daily INR's while admitted because cipro can increase the INR. Ms. Nodal last INR was already on the high side so will need to monitor more closely.   Plan: 1. Continue home dose for now 2. Check INR daily due to start of cipro  Lysle Pearl, PharmD, BCPS Pager # [phone] 09/22/2011 9:01 AM

## 2011-09-22 NOTE — Progress Notes (Signed)
Patient ID: [name], female   DOB: 1961/01/06, 51 y.o.   MRN: [id number] Patient ID: [name], female   DOB: 04-May-1960, 51 y.o.   MRN: [id number] Subjective/Complaints: Slept well again. . Excited to go home. Other ROS items reviewed and negative  Objective: Vital Signs: Blood pressure 120/74, pulse 88, temperature 98.3 F (36.8 C), temperature source Oral, resp. rate 18, height 5\' 4"  (1.626 m), weight 125.7 kg (277 lb 1.9 oz), last menstrual period 07/26/2011, SpO2 100.00%. No results found. No results found for this basename: WBC:2,HGB:2,HCT:2,PLT:2 in the last 72 hours No results found for this basename: NA:2,K:2,CL:2,CO2:2,GLUCOSE:2,BUN:2,CREATININE:2,CALCIUM:2 in the last 72 hours CBG (last 3)  No results found for this basename: GLUCAP:3 in the last 72 hours  Wt Readings from Last 3 Encounters:  09/17/11 125.7 kg (277 lb 1.9 oz)  09/09/11 124.739 kg (275 lb)  09/09/11 124.739 kg (275 lb)    Physical Exam:  General appearance: alert, cooperative and no distress Head: Normocephalic, without obvious abnormality, atraumatic Eyes:  Ears:  Nose: Nares normal. Septum midline. Mucosa normal. No drainage or sinus tenderness. Throat: lips, mucosa, and tongue normal; teeth and gums normal Neck: no adenopathy, no carotid bruit, no JVD, supple, symmetrical, trachea midline and thyroid not enlarged, symmetric, no tenderness/mass/nodules Back: symmetric, no curvature. ROM normal. No CVA tenderness. Resp: clear to auscultation bilaterally Cardio: regular rate and rhythm, S1, S2 normal, no murmur, click, rub or gallop GI: soft, non-tender; bowel sounds normal; no masses,  no organomegaly Extremities: extremities normal, atraumatic, no cyanosis or edema Pulses: 2+ and symmetric Skin: Skin color, texture, turgor normal. No rashes or lesions Neurologic: CN exam normal . Pain inhibition weakness in left LE. No sensory changes. Cognition appropriate Incision/Wound:  Wound clean and  intact without drainage. Small blister on lateral leg (left) M/S: knee ROM about 90 degrees actively this am.  Assessment/Plan: 1. Functional deficits secondary to left TKA, morbid obesity which require 3+ hours per day of interdisciplinary therapy in a comprehensive inpatient rehab setting. Physiatrist is providing close team supervision and 24 hour management of active medical problems listed below. Physiatrist and rehab team continue to assess barriers to discharge/monitor patient progress toward functional and medical goals.  No longer needs KI.  Dc home today. outpt therapies.  i'll see her back in 6 weeks  FIM: FIM - Bathing Bathing Steps Patient Completed: Chest;Right Arm;Left Arm;Abdomen;Front perineal area;Buttocks;Right upper leg;Left upper leg;Left lower leg (including foot);Right lower leg (including foot) Bathing: 5: Supervision: Safety issues/verbal cues  FIM - Upper Body Dressing/Undressing Upper body dressing/undressing steps patient completed: Thread/unthread right bra strap;Thread/unthread left bra strap;Hook/unhook bra;Thread/unthread right sleeve of pullover shirt/dresss;Thread/unthread left sleeve of pullover shirt/dress;Put head through opening of pull over shirt/dress;Pull shirt over trunk Upper body dressing/undressing: 5: Supervision: Safety issues/verbal cues FIM - Lower Body Dressing/Undressing Lower body dressing/undressing steps patient completed: Thread/unthread right underwear leg;Thread/unthread left underwear leg;Pull underwear up/down;Pull pants up/down;Thread/unthread left pants leg;Thread/unthread right pants leg;Don/Doff right sock;Don/Doff left sock Lower body dressing/undressing: 5: Supervision: Safety issues/verbal cues  FIM - Toileting Toileting steps completed by patient: Adjust clothing prior to toileting;Performs perineal hygiene;Adjust clothing after toileting Toileting Assistive Devices: Grab bar or rail for support Toileting: 4: Steadying  assist  FIM - Diplomatic Services operational officer Devices: Art gallery manager Transfers: 7-Independent: No helper  FIM - Banker Devices: Environmental consultant;Arm rests Bed/Chair Transfer: 7: Independent: No helper  FIM - Locomotion: Wheelchair Locomotion: Wheelchair: 1: Total Assistance/staff pushes wheelchair (Pt<25%) FIM - Locomotion: Ambulation  Locomotion: Ambulation Assistive Devices: Designer, industrial/product Ambulation/Gait Assistance: 5: Supervision Locomotion: Ambulation: 5: Travels 150 ft or more with supervision/safety issues  Comprehension Comprehension Mode: Auditory Comprehension: 7-Follows complex conversation/direction: With no assist  Expression Expression Mode: Verbal Expression: 7-Expresses complex ideas: With no assist  Social Interaction Social Interaction: 6-Interacts appropriately with others with medication or extra time (anti-anxiety, antidepressant).  Problem Solving Problem Solving: 7-Solves complex problems: Recognizes & self-corrects  Memory Memory: 7-Complete Independence: No helper  1. DVT Prophylaxis/Anticoagulation: Pharmaceutical: Coumadin  2. Pain Management:  OxyContin and oxycodone-improving. 3. Mood: Motivated to get better. Will have SW to follow up for formal evaluation.  4. ABLA: Hgb with some improvement past transfusion. No active signs of bleeding.  5. OSA:  CPAP use when sleeping  6. Morbid obesity: Pressure relief measures. Patient adheres to CM diet to help with weight control.  7. HTN: On cardizem at HS.  Continue to hold diovan HCTZ. NO orthostasis.  BP 120-130 range overall.  F/U with MD for input regarding resuming diovan HCT 8. GERD: continue protonix 9. Wound care- local care to blisters/skin irritation resolved 10.  Frequency/urgency: will recheck urine culture as with symptoms.  Will d/c on cipro.    LOS (Days) 7 A FACE TO FACE EVALUATION WAS PERFORMED  Ivory Broad, MD  Jacquelynn Cree 09/22/2011, 8:37 AM

## 2011-09-22 NOTE — Progress Notes (Signed)
Occupational Therapy Session Note & Discharge Summary  Patient Details  Name: [name] MRN: [id number] Date of Birth: [birth year]  Today's Date: 09/22/2011 Time: 0900-1000 Time Calculation (min): 60 minPt performed all bathing and dressing tasks with mod I.  Pt performs bathing and dressing tasks seated at sink with sit to stand to bathe buttocks and pull up pants.  Pt amb with r/w to bathroom for toileting.  Focus on safety awareness and activity tolerance.  Patient has met 12 of 12 long term goals due to improved activity tolerance, improved balance, ability to compensate for deficits and improved coordination.  Pt is mod I for BADLs and kitchen and homemaking tasks. Pt able to complete all tasks without use of AE.  Pt already owns BSC and tub transfer bench. Patient to discharge at overall Modified Independent level.  No caregiver needed at this time secondary to patient at a modified independent level for aspects of daily living.   Reasons goals not met: n/a, all goals met at this time.  Recommendation: No additional occupational therapy recommended at this time.  Equipment: No equipment provided Pt already owns BSC and tub transfer bench  Reasons for discharge: treatment goals met and discharge from hospital  Patient/family agrees with progress made and goals achieved: Yes  Precautions/Restrictions  Restrictions Weight Bearing Restrictions: No LLE Weight Bearing: Weight bearing as tolerated  Pain Pain Assessment Pain Assessment: No/denies pain Pain Score: 0-No pain  ADL ADL Eating: Independent Where Assessed-Eating: Chair Grooming: Independent Where Assessed-Grooming: Sitting at sink Upper Body Bathing: Independent Where Assessed-Upper Body Bathing: Sitting at sink Lower Body Bathing: Modified independent Where Assessed-Lower Body Bathing: Sitting at sink;Standing at sink Upper Body Dressing: Independent Where Assessed-Upper Body Dressing: Sitting at sink Lower  Body Dressing: Modified independent Where Assessed-Lower Body Dressing: Sitting at sink;Standing at sink Toileting: Modified independent Where Assessed-Toileting: Bedside Commode Toilet Transfer: Modified independent Toilet Transfer Method: Proofreader: Bedside commode Tub/Shower Transfer: Modified independent Tub/Shower Transfer Method: Ship broker: Emergency planning/management officer  Vision/Perception  Vision - History Baseline Vision: Wears glasses all the time Patient Visual Report: No change from baseline Vision - Assessment Eye Alignment: Within Functional Limits Perception Perception: Within Functional Limits Praxis Praxis: Intact   Cognition Overall Cognitive Status: Appears within functional limits for tasks assessed Arousal/Alertness: Awake/alert Orientation Level: Oriented X4 Memory: Appears intact Awareness: Appears intact Problem Solving: Appears intact Safety/Judgment: Appears intact  Sensation Sensation Light Touch: Appears Intact Stereognosis: Appears Intact Hot/Cold: Appears Intact Proprioception: Appears Intact Coordination Gross Motor Movements are Fluid and Coordinated: Yes Fine Motor Movements are Fluid and Coordinated: Yes  Motor  Motor Motor: Within Functional Limits  Trunk/Postural Assessment  Cervical Assessment Cervical Assessment: Within Functional Limits Thoracic Assessment Thoracic Assessment: Within Functional Limits Lumbar Assessment Lumbar Assessment: Within Functional Limits Postural Control Postural Control: Within Functional Limits   Balance Static Sitting Balance Static Sitting - Level of Assistance: 7: Independent Dynamic Sitting Balance Dynamic Sitting - Level of Assistance: 7: Independent  Extremity/Trunk Assessment RUE Assessment RUE Assessment: Within Functional Limits LUE Assessment LUE Assessment: Within Functional Limits  See FIM for current functional status  Rich Brave 09/22/2011, 10:03 AM

## 2011-09-23 LAB — URINE CULTURE
Colony Count: NO GROWTH
Culture  Setup Time: 201306041020
Culture: NO GROWTH

## 2011-09-29 ENCOUNTER — Ambulatory Visit: Payer: BC Managed Care – PPO | Attending: Physical Medicine & Rehabilitation

## 2011-09-29 DIAGNOSIS — M25569 Pain in unspecified knee: Secondary | ICD-10-CM | POA: Insufficient documentation

## 2011-09-29 DIAGNOSIS — M25669 Stiffness of unspecified knee, not elsewhere classified: Secondary | ICD-10-CM | POA: Insufficient documentation

## 2011-09-29 DIAGNOSIS — IMO0001 Reserved for inherently not codable concepts without codable children: Secondary | ICD-10-CM | POA: Insufficient documentation

## 2011-09-29 DIAGNOSIS — R262 Difficulty in walking, not elsewhere classified: Secondary | ICD-10-CM | POA: Insufficient documentation

## 2011-10-02 ENCOUNTER — Ambulatory Visit (INDEPENDENT_AMBULATORY_CARE_PROVIDER_SITE_OTHER): Payer: BC Managed Care – PPO | Admitting: Surgery

## 2011-10-06 ENCOUNTER — Ambulatory Visit: Payer: BC Managed Care – PPO

## 2011-10-08 ENCOUNTER — Ambulatory Visit: Payer: BC Managed Care – PPO

## 2011-10-09 ENCOUNTER — Ambulatory Visit: Payer: BC Managed Care – PPO

## 2011-10-12 ENCOUNTER — Ambulatory Visit: Payer: BC Managed Care – PPO

## 2011-10-13 ENCOUNTER — Ambulatory Visit: Payer: BC Managed Care – PPO

## 2011-10-15 ENCOUNTER — Ambulatory Visit: Payer: BC Managed Care – PPO

## 2011-10-20 ENCOUNTER — Ambulatory Visit: Payer: BC Managed Care – PPO | Attending: Physical Medicine & Rehabilitation | Admitting: Physical Therapy

## 2011-10-20 DIAGNOSIS — M25669 Stiffness of unspecified knee, not elsewhere classified: Secondary | ICD-10-CM | POA: Insufficient documentation

## 2011-10-20 DIAGNOSIS — IMO0001 Reserved for inherently not codable concepts without codable children: Secondary | ICD-10-CM | POA: Insufficient documentation

## 2011-10-20 DIAGNOSIS — M25569 Pain in unspecified knee: Secondary | ICD-10-CM | POA: Insufficient documentation

## 2011-10-20 DIAGNOSIS — R262 Difficulty in walking, not elsewhere classified: Secondary | ICD-10-CM | POA: Insufficient documentation

## 2011-10-26 ENCOUNTER — Encounter: Payer: BC Managed Care – PPO | Admitting: Physical Therapy

## 2011-10-26 ENCOUNTER — Ambulatory Visit: Payer: BC Managed Care – PPO | Admitting: Physical Therapy

## 2011-10-27 ENCOUNTER — Ambulatory Visit: Payer: BC Managed Care – PPO | Admitting: Physical Therapy

## 2011-10-29 ENCOUNTER — Ambulatory Visit: Payer: BC Managed Care – PPO | Admitting: Physical Therapy

## 2011-10-29 ENCOUNTER — Encounter: Payer: BC Managed Care – PPO | Admitting: Physical Therapy

## 2011-11-02 ENCOUNTER — Ambulatory Visit: Payer: BC Managed Care – PPO | Admitting: Physical Therapy

## 2011-11-03 ENCOUNTER — Encounter: Payer: BC Managed Care – PPO | Admitting: Physical Therapy

## 2011-11-05 ENCOUNTER — Ambulatory Visit: Payer: BC Managed Care – PPO | Admitting: Physical Therapy

## 2011-11-09 ENCOUNTER — Encounter: Payer: BC Managed Care – PPO | Admitting: Physical Therapy

## 2011-11-11 ENCOUNTER — Ambulatory Visit: Payer: BC Managed Care – PPO | Admitting: Physical Therapy

## 2011-11-12 ENCOUNTER — Ambulatory Visit: Payer: BC Managed Care – PPO | Admitting: Physical Therapy

## 2011-11-12 ENCOUNTER — Ambulatory Visit (INDEPENDENT_AMBULATORY_CARE_PROVIDER_SITE_OTHER): Payer: BC Managed Care – PPO | Admitting: Surgery

## 2011-11-23 ENCOUNTER — Ambulatory Visit: Payer: BC Managed Care – PPO | Attending: Family Medicine | Admitting: Physical Therapy

## 2011-11-23 DIAGNOSIS — R262 Difficulty in walking, not elsewhere classified: Secondary | ICD-10-CM | POA: Insufficient documentation

## 2011-11-23 DIAGNOSIS — M25669 Stiffness of unspecified knee, not elsewhere classified: Secondary | ICD-10-CM | POA: Insufficient documentation

## 2011-11-23 DIAGNOSIS — M25569 Pain in unspecified knee: Secondary | ICD-10-CM | POA: Insufficient documentation

## 2011-11-23 DIAGNOSIS — IMO0001 Reserved for inherently not codable concepts without codable children: Secondary | ICD-10-CM | POA: Insufficient documentation

## 2011-11-24 ENCOUNTER — Ambulatory Visit: Payer: BC Managed Care – PPO | Admitting: Physical Therapy

## 2011-11-25 ENCOUNTER — Encounter: Payer: BC Managed Care – PPO | Admitting: Physical Therapy

## 2011-11-26 ENCOUNTER — Encounter: Payer: BC Managed Care – PPO | Admitting: Physical Therapy

## 2012-04-22 ENCOUNTER — Encounter (HOSPITAL_COMMUNITY): Payer: Self-pay | Admitting: Pharmacy Technician

## 2012-05-03 ENCOUNTER — Other Ambulatory Visit: Payer: Self-pay | Admitting: Ophthalmology

## 2012-05-03 ENCOUNTER — Encounter: Payer: Self-pay | Admitting: Ophthalmology

## 2012-05-03 ENCOUNTER — Encounter (HOSPITAL_BASED_OUTPATIENT_CLINIC_OR_DEPARTMENT_OTHER): Payer: Self-pay

## 2012-05-03 ENCOUNTER — Encounter (HOSPITAL_COMMUNITY)
Admission: RE | Disposition: A | Discharge status: Home / Self Care | Payer: Self-pay | Source: Ambulatory Visit | Attending: Ophthalmology

## 2012-05-03 ENCOUNTER — Ambulatory Visit (HOSPITAL_COMMUNITY)
Admission: RE | Admit: 2012-05-03 | Discharge: 2012-05-03 | Disposition: A | Discharge status: Home / Self Care | Payer: BC Managed Care – PPO | Source: Ambulatory Visit | Attending: Ophthalmology | Admitting: Ophthalmology

## 2012-05-03 DIAGNOSIS — Z538 Procedure and treatment not carried out for other reasons: Secondary | ICD-10-CM | POA: Insufficient documentation

## 2012-05-03 DIAGNOSIS — H2640 Unspecified secondary cataract: Secondary | ICD-10-CM | POA: Insufficient documentation

## 2012-05-03 HISTORY — PX: YAG LASER APPLICATION: SHX6189

## 2012-05-03 HISTORY — PX: CAPSULOTOMY: SHX5412

## 2012-05-03 SURGERY — MINOR CAPSULOTOMY
Anesthesia: Choice | Laterality: Right

## 2012-05-03 SURGERY — MINOR CAPSULOTOMY
Anesthesia: LOCAL | Site: Eye | Laterality: Right

## 2012-05-03 MED ORDER — APRACLONIDINE HCL 0.5 % OP SOLN
1.0000 [drp] | Freq: Once | OPHTHALMIC | Status: DC
Start: 2012-05-03 — End: 2012-05-03

## 2012-05-03 MED ORDER — APRACLONIDINE HCL 0.5 % OP SOLN
OPHTHALMIC | Status: AC
  Filled 2012-05-03: qty 5

## 2012-05-03 MED ORDER — CYCLOPENTOLATE-PHENYLEPHRINE 0.2-1 % OP SOLN
OPHTHALMIC | Status: AC
  Filled 2012-05-03: qty 2

## 2012-05-03 MED ORDER — CYCLOPENTOLATE-PHENYLEPHRINE 0.2-1 % OP SOLN: 2.0000 [drp] | Freq: Once | OPHTHALMIC | Status: DC

## 2012-05-03 SURGICAL SUPPLY — 28 items
APPLICATOR COTTON TIP 6IN STRL (MISCELLANEOUS) ×2 IMPLANT
BAG FLD CLT MN 6.25X3.5 (WOUND CARE)
BAG MINI COLL DRAIN (WOUND CARE) IMPLANT
BLADE KERATOME 2.75 (BLADE) ×2 IMPLANT
BLADE MINI RND TIP GREEN BEAV (BLADE) IMPLANT
CLOTH BEACON ORANGE TIMEOUT ST (SAFETY) ×2 IMPLANT
CORDS BIPOLAR (ELECTRODE) ×2 IMPLANT
DRAPE OPHTHALMIC 40X48 W POUCH (DRAPES) ×2 IMPLANT
DRAPE RETRACTOR (MISCELLANEOUS) ×2 IMPLANT
GLOVE ECLIPSE 7.0 STRL STRAW (GLOVE) ×2 IMPLANT
GOWN STRL NON-REIN LRG LVL3 (GOWN DISPOSABLE) ×4 IMPLANT
KIT BASIN OR (CUSTOM PROCEDURE TRAY) ×2 IMPLANT
KIT ROOM TURNOVER OR (KITS) ×2 IMPLANT
KNIFE CRESCENT 2.5 55 ANG (BLADE) ×2 IMPLANT
MARKER SKIN DUAL TIP RULER LAB (MISCELLANEOUS) IMPLANT
NS IRRIG 1000ML POUR BTL (IV SOLUTION) ×2 IMPLANT
PACK CATARACT CUSTOM (CUSTOM PROCEDURE TRAY) ×2 IMPLANT
PACK CATARACT MCHSCP (PACKS) IMPLANT
PAD ARMBOARD 7.5X6 YLW CONV (MISCELLANEOUS) ×4 IMPLANT
PROBE ANTERIOR 20G W/INFUS NDL (MISCELLANEOUS) IMPLANT
SPEAR EYE SURG WECK-CEL (MISCELLANEOUS) IMPLANT
SUT ETHILON 10 0 CS140 6 (SUTURE) IMPLANT
SUT VICRYL 8 0 TG140 8 (SUTURE) IMPLANT
SYR 3ML LL SCALE MARK (SYRINGE) IMPLANT
TIP SILICONE STR (MISCELLANEOUS)
TIP SILICONE STR 0.3MM UFLOW (MISCELLANEOUS) IMPLANT
TOWEL OR 17X24 6PK STRL BLUE (TOWEL DISPOSABLE) ×4 IMPLANT
WATER STERILE IRR 1000ML POUR (IV SOLUTION) ×2 IMPLANT

## 2012-05-03 NOTE — Brief Op Note (Signed)
05/03/2012  7:37 AM  PATIENT:  [name]  52 y.o. female  PRE-OPERATIVE DIAGNOSIS:  OPACQUE POSTERIOR CAPSULE RIGHT EYE   POST-OPERATIVE DIAGNOSIS:  * No post-op diagnosis entered *  PROCEDURE:  Procedure(s) (LRB) with comments: MINOR CAPSULOTOMY (Right) YAG LASER APPLICATION (Right)  SURGEON:  Surgeon(s) and Role:    Vita Erm., MD - Primary  PHYSICIAN ASSISTANT:   ASSISTANTS: none   ANESTHESIA:   none  EBL:     BLOOD ADMINISTERED:none  DRAINS: none   LOCAL MEDICATIONS USED:  NONE  SPECIMEN:  No Specimen  DISPOSITION OF SPECIMEN:  N/A  COUNTS:  YES  TOURNIQUET:  * No tourniquets in log *  DICTATION: .Other Dictation: Dictation Number [id number]  PLAN OF CARE: Discharge to home after PACU  PATIENT DISPOSITION:  Short Stay   Delay start of Pharmacological VTE agent (>24hrs) due to surgical blood loss or risk of bleeding: not applicable

## 2012-05-03 NOTE — H&P (Signed)
  52 yo female has had cataract surgery right eye.  Has blurred vision due to opaque posterior capsule.  Admitted for yag laser capsulotomy right eye

## 2012-05-03 NOTE — Progress Notes (Signed)
Patient did not arrive to ssc until 1230pm, DR BREWINGTON STATED HE WOULD HAVE TO RESCEDULE.  PATIENT INSTRUCTED TO CALL YOLANDA AT OFFICE IN .

## 2012-05-05 ENCOUNTER — Encounter (HOSPITAL_COMMUNITY)
Admission: RE | Disposition: A | Discharge status: Home / Self Care | Payer: Self-pay | Source: Ambulatory Visit | Attending: Ophthalmology

## 2012-05-05 ENCOUNTER — Other Ambulatory Visit: Payer: Self-pay | Admitting: Ophthalmology

## 2012-05-05 ENCOUNTER — Ambulatory Visit (HOSPITAL_COMMUNITY)
Admission: RE | Admit: 2012-05-05 | Discharge: 2012-05-05 | Disposition: A | Discharge status: Home / Self Care | Payer: BC Managed Care – PPO | Source: Ambulatory Visit | Attending: Ophthalmology | Admitting: Ophthalmology

## 2012-05-05 ENCOUNTER — Encounter: Payer: Self-pay | Admitting: Ophthalmology

## 2012-05-05 DIAGNOSIS — H269 Unspecified cataract: Secondary | ICD-10-CM | POA: Insufficient documentation

## 2012-05-05 HISTORY — PX: CAPSULOTOMY: SHX5412

## 2012-05-05 HISTORY — PX: YAG LASER APPLICATION: SHX6189

## 2012-05-05 SURGERY — MINOR CAPSULOTOMY
Anesthesia: LOCAL | Site: Eye | Laterality: Right

## 2012-05-05 MED ORDER — APRACLONIDINE HCL 0.5 % OP SOLN
OPHTHALMIC | Status: AC
  Filled 2012-05-05: qty 5

## 2012-05-05 MED ORDER — CYCLOPENTOLATE-PHENYLEPHRINE 0.2-1 % OP SOLN
OPHTHALMIC | Status: AC
  Filled 2012-05-05: qty 2

## 2012-05-05 MED ORDER — APRACLONIDINE HCL 0.5 % OP SOLN
1.0000 [drp] | Freq: Once | OPHTHALMIC | Status: AC
  Administered 2012-05-05: 1 [drp] via OPHTHALMIC

## 2012-05-05 MED ORDER — CYCLOPENTOLATE-PHENYLEPHRINE 0.2-1 % OP SOLN
2.0000 [drp] | Freq: Once | OPHTHALMIC | Status: AC
  Administered 2012-05-05: 2 [drp] via OPHTHALMIC

## 2012-05-05 SURGICAL SUPPLY — 28 items
APPLICATOR COTTON TIP 6IN STRL (MISCELLANEOUS) ×2 IMPLANT
BAG FLD CLT MN 6.25X3.5 (WOUND CARE)
BAG MINI COLL DRAIN (WOUND CARE) IMPLANT
BLADE KERATOME 2.75 (BLADE) ×2 IMPLANT
BLADE MINI RND TIP GREEN BEAV (BLADE) IMPLANT
CLOTH BEACON ORANGE TIMEOUT ST (SAFETY) ×2 IMPLANT
CORDS BIPOLAR (ELECTRODE) ×2 IMPLANT
DRAPE OPHTHALMIC 40X48 W POUCH (DRAPES) ×2 IMPLANT
DRAPE RETRACTOR (MISCELLANEOUS) ×2 IMPLANT
GLOVE ECLIPSE 7.0 STRL STRAW (GLOVE) ×2 IMPLANT
GOWN STRL NON-REIN LRG LVL3 (GOWN DISPOSABLE) ×4 IMPLANT
KIT BASIN OR (CUSTOM PROCEDURE TRAY) ×2 IMPLANT
KIT ROOM TURNOVER OR (KITS) ×2 IMPLANT
KNIFE CRESCENT 2.5 55 ANG (BLADE) ×2 IMPLANT
MARKER SKIN DUAL TIP RULER LAB (MISCELLANEOUS) IMPLANT
NS IRRIG 1000ML POUR BTL (IV SOLUTION) ×2 IMPLANT
PACK CATARACT CUSTOM (CUSTOM PROCEDURE TRAY) ×2 IMPLANT
PACK CATARACT MCHSCP (PACKS) IMPLANT
PAD ARMBOARD 7.5X6 YLW CONV (MISCELLANEOUS) ×4 IMPLANT
PROBE ANTERIOR 20G W/INFUS NDL (MISCELLANEOUS) IMPLANT
SPEAR EYE SURG WECK-CEL (MISCELLANEOUS) IMPLANT
SUT ETHILON 10 0 CS140 6 (SUTURE) IMPLANT
SUT VICRYL 8 0 TG140 8 (SUTURE) IMPLANT
SYR 3ML LL SCALE MARK (SYRINGE) IMPLANT
TIP SILICONE STR (MISCELLANEOUS)
TIP SILICONE STR 0.3MM UFLOW (MISCELLANEOUS) IMPLANT
TOWEL OR 17X24 6PK STRL BLUE (TOWEL DISPOSABLE) ×4 IMPLANT
WATER STERILE IRR 1000ML POUR (IV SOLUTION) ×2 IMPLANT

## 2012-05-05 NOTE — Brief Op Note (Signed)
05/05/2012  7:30 AM  PATIENT:  [name]  52 y.o. female  PRE-OPERATIVE DIAGNOSIS:  Opacque Posterior Capsule Right Eye  POST-OPERATIVE DIAGNOSIS:  * No post-op diagnosis entered *  PROCEDURE:  Procedure(s) (LRB) with comments: MINOR CAPSULOTOMY (Right) YAG LASER APPLICATION (Right)  SURGEON:  Surgeon(s) and Role:    Vita Erm., MD - Primary  PHYSICIAN ASSISTANT:   ASSISTANTS: none   ANESTHESIA:   none  EBL:     BLOOD ADMINISTERED:none  DRAINS: none   LOCAL MEDICATIONS USED:  NONE  SPECIMEN:  No Specimen  DISPOSITION OF SPECIMEN:  N/A  COUNTS:  YES  TOURNIQUET:  * No tourniquets in log *  DICTATION: .Other Dictation: Dictation Number [id number]  PLAN OF CARE: Discharge to home after PACU  PATIENT DISPOSITION:  Short Stay   Delay start of Pharmacological VTE agent (>24hrs) due to surgical blood loss or risk of bleeding: not applicable

## 2012-05-05 NOTE — H&P (Signed)
  52 yo female has had cataract suregry right eye.  Now has blurred vision due to cloudy posterior capsule.  Admitted  For yag laser capsulotomy right eye.

## 2012-05-06 ENCOUNTER — Encounter (HOSPITAL_COMMUNITY): Payer: Self-pay | Admitting: Ophthalmology

## 2012-05-06 NOTE — Op Note (Deleted)
NAME:  [name], [name]               ACCOUNT NO.:  [id number]  MEDICAL RECORD NO.:  [id number]  LOCATION:  [facility]                         FACILITY:  [facility]  PHYSICIAN:  Cornell Gaber Jr., M.D.DATE OF BIRTH:  01/15/1961  DATE OF PROCEDURE:  05/05/2012 DATE OF DISCHARGE:  05/05/2012                              OPERATIVE REPORT   PREOPERATIVE DIAGNOSIS:  Opaque posterior capsule, right eye.  POSTOPERATIVE DIAGNOSIS:  Opaque posterior capsule, right eye.  OPERATION:  YAG laser capsulotomy.  JUSTIFICATION FOR PROCEDURE:  This is a 51-year-old lady who underwent a cataract extraction from the right eye several months ago.  She developed a posterior subcapsular opacity which is causing her to have blurred vision.  YAG laser capsulotomy was recommended.  She is admitted at this time for the proposed procedure.  OPERATIVE PROCEDURE:  The patient was brought to the laser room and positioned appropriately behind the YAG laser.  Approximately 24 applications of laser energy of 3 mJ were applied to the posterior capsule obtaining a satisfactory opening.  The patient tolerated the procedure well and was discharged to the postanesthesia recovery room with instructions to continue her glaucoma medications and to see me in the office tomorrow for further evaluation.  DISCHARGE DIAGNOSIS:  Opaque posterior capsule, right eye.     Ezio Wieck Jr., M.D.     TB/MEDQ  D:  05/06/2012  T:  05/06/2012  Job:  [id number] 

## 2012-05-06 NOTE — Op Note (Signed)
NAME:  [name], [name] NO.:  [id number]  MEDICAL RECORD NO.:  [id number]  LOCATION:  [facility]                         FACILITY:  [facility]  PHYSICIAN:  Salley Scarlet., M.D.DATE OF BIRTH:  Feb 04, 1961  DATE OF PROCEDURE:  05/05/2012 DATE OF DISCHARGE:  05/05/2012                              OPERATIVE REPORT   PREOPERATIVE DIAGNOSIS:  Opaque posterior capsule, right eye.  POSTOPERATIVE DIAGNOSIS:  Opaque posterior capsule, right eye.  OPERATION:  YAG laser capsulotomy.  JUSTIFICATION FOR PROCEDURE:  This is a 52 year old lady who underwent a cataract extraction from the right eye several months ago.  She developed a posterior subcapsular opacity which is causing her to have blurred vision.  YAG laser capsulotomy was recommended.  She is admitted at this time for the proposed procedure.  OPERATIVE PROCEDURE:  The patient was brought to the laser room and positioned appropriately behind the YAG laser.  Approximately 24 applications of laser energy of 3 mJ were applied to the posterior capsule obtaining a satisfactory opening.  The patient tolerated the procedure well and was discharged to the postanesthesia recovery room with instructions to continue her glaucoma medications and to see me in the office tomorrow for further evaluation.  DISCHARGE DIAGNOSIS:  Opaque posterior capsule, right eye.     Salley Scarlet., M.D.     TB/MEDQ  D:  05/06/2012  T:  05/06/2012  Job:  [id number]

## 2012-05-26 ENCOUNTER — Ambulatory Visit (HOSPITAL_BASED_OUTPATIENT_CLINIC_OR_DEPARTMENT_OTHER): Admit: 2012-05-26 | Payer: Self-pay | Admitting: Ophthalmology

## 2012-06-26 IMAGING — CR DG CERVICAL SPINE 2 OR 3 VIEWS
1 series · 1 of 1 positions shown · non-contrast
Comparison: 09/08/2010

CLINICAL DATA: C3-4 and C4-5 ACDF.

CERVICAL SPINE - 2-3 VIEW

[view not recorded]
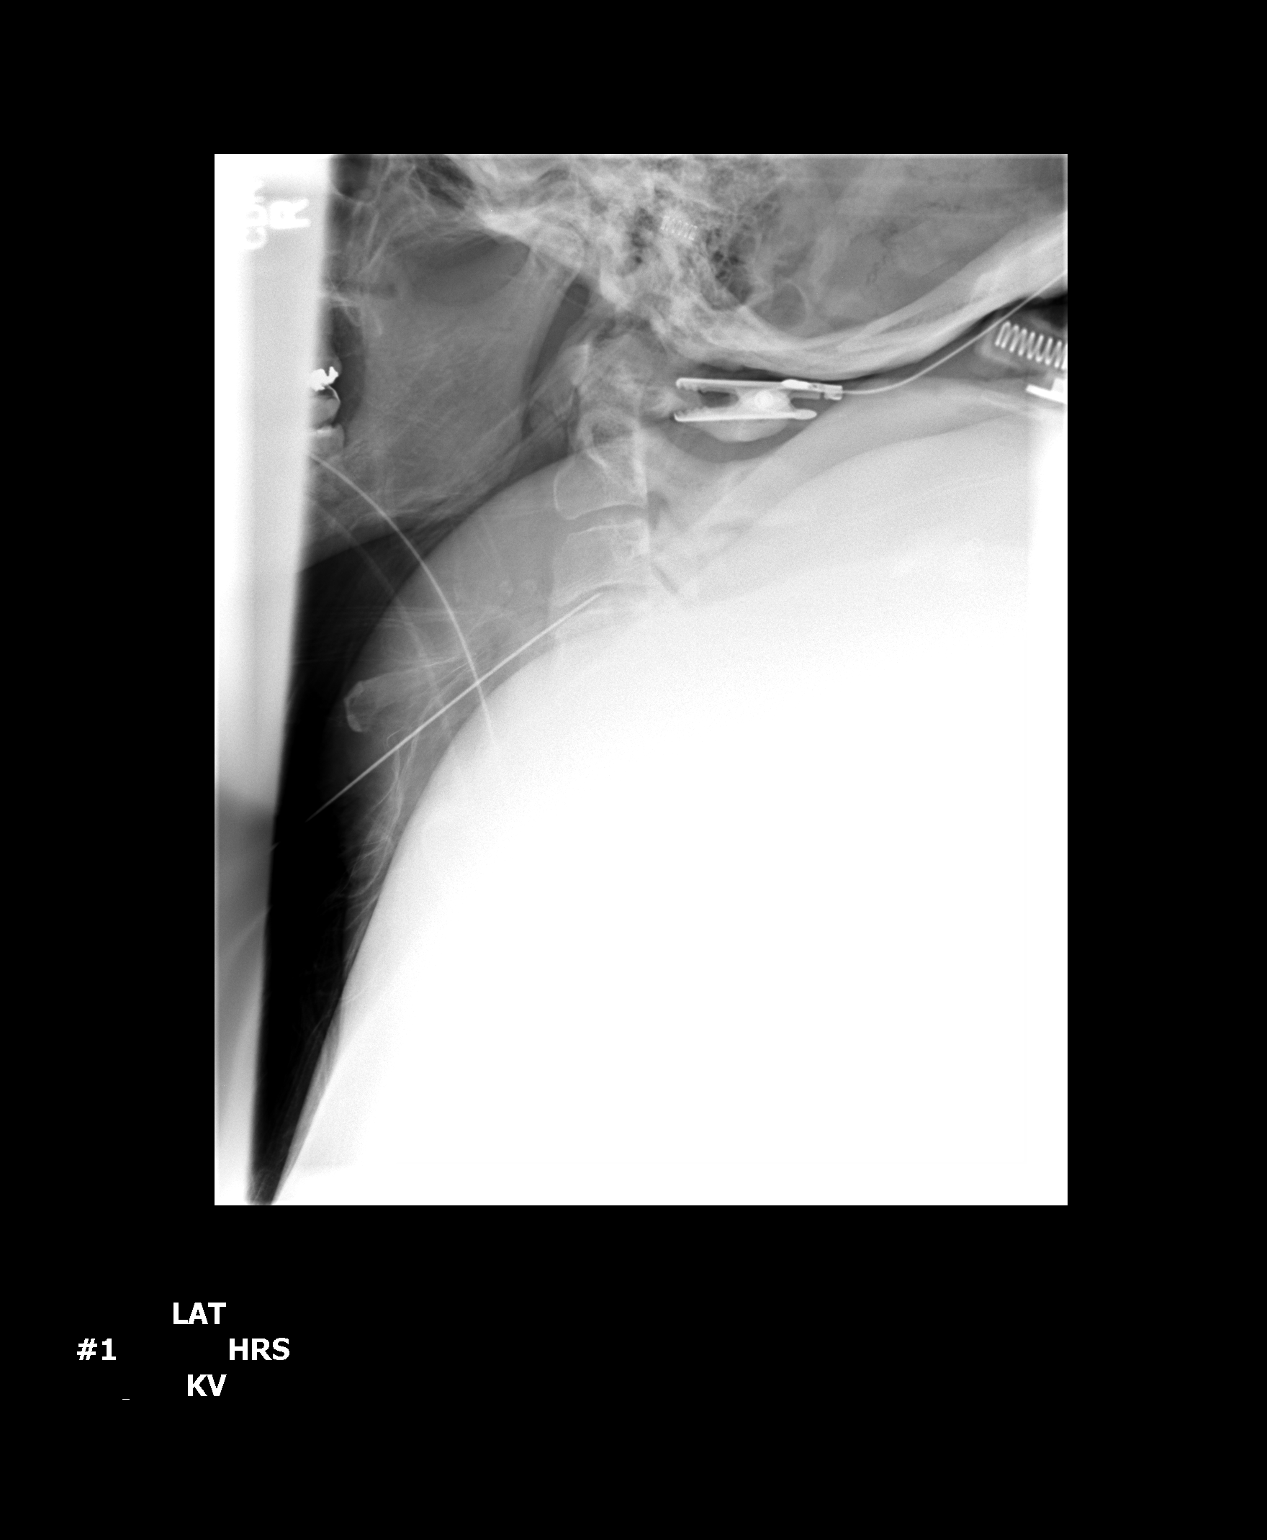

[1 of 1 positions shown; findings below may reference images not displayed]

FINDINGS: Cross-table portable view obtained at 7443 hours is
labeled #1.  This shows a spinal needle over the anterior soft
tissues with the tip overlying the C3-4 interspace.  Endotracheal
tube is noted.
IMPRESSION: Intraoperative localization.

## 2012-06-26 IMAGING — CR DG CHEST 1V PORT
1 series · 1 of 1 positions shown · non-contrast
Comparison: 01/23/2011 and 01/18/2011.

CLINICAL DATA: Preoperative respiratory examination.  History of
sleep apnea.

PORTABLE CHEST - 1 VIEW

[view not recorded]
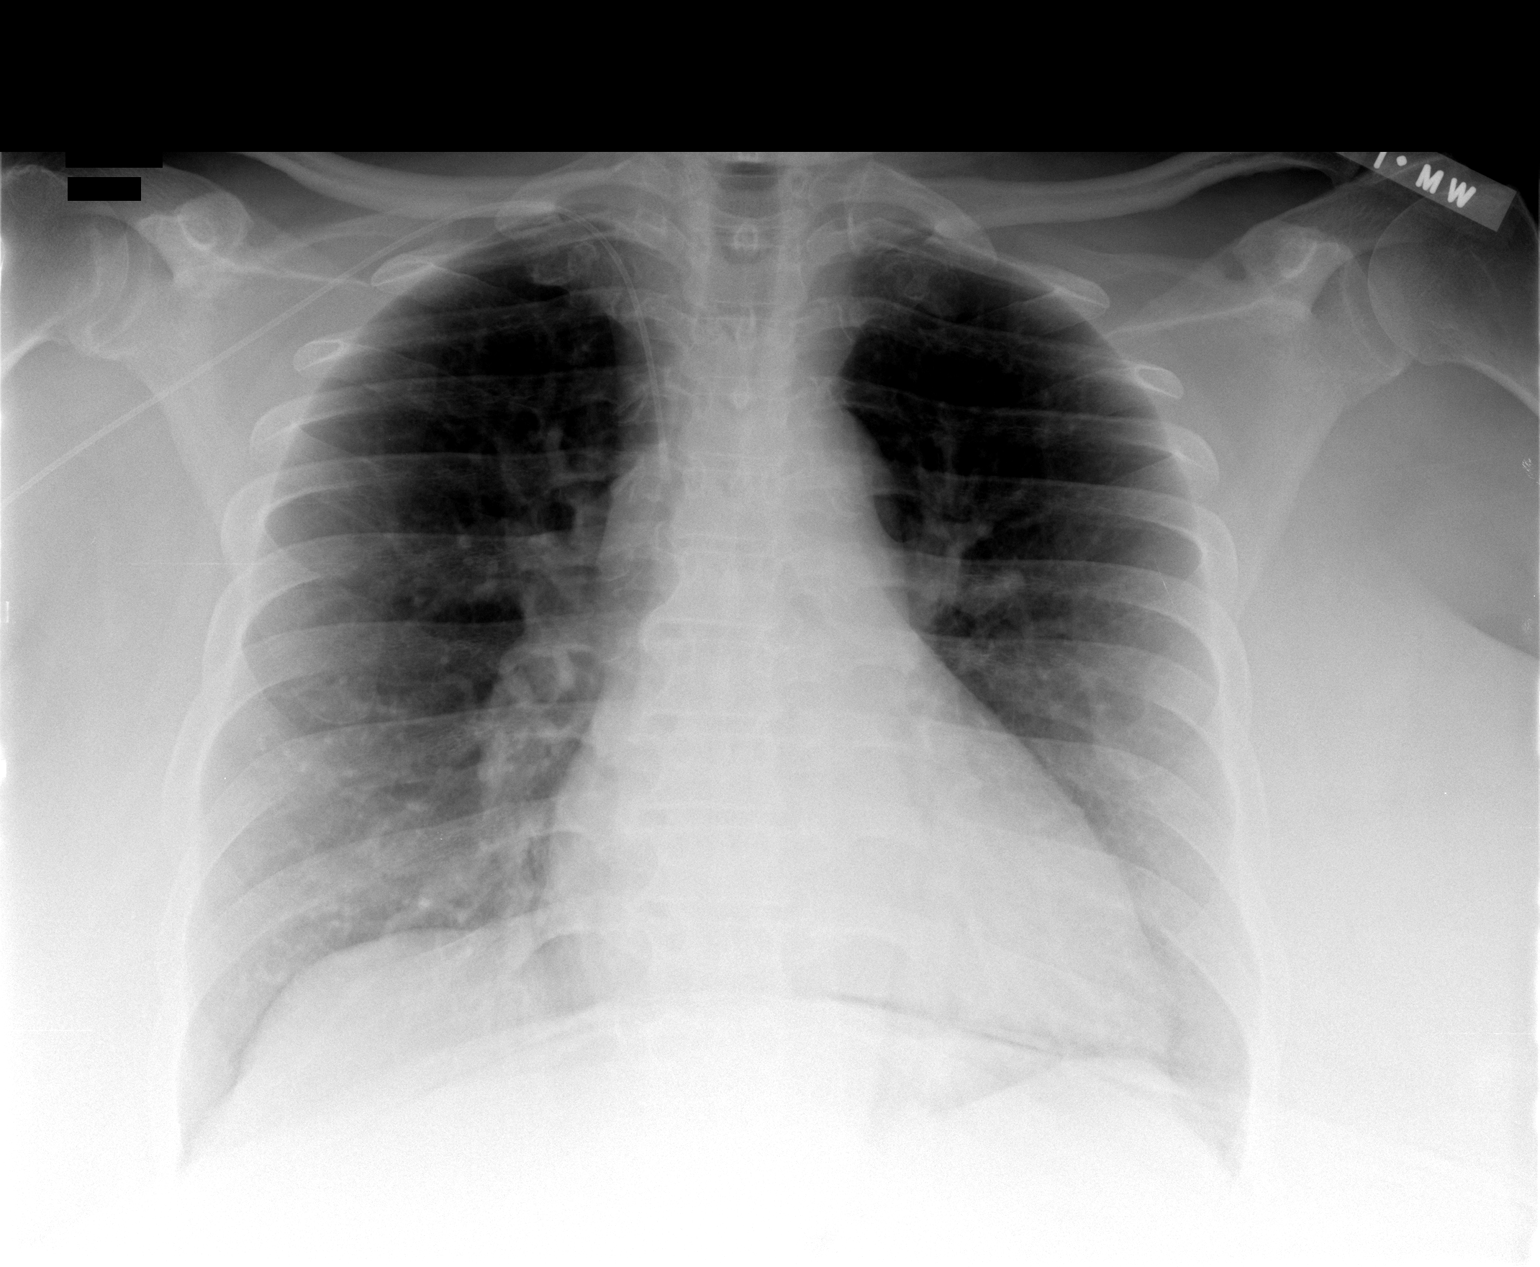

[1 of 1 positions shown; findings below may reference images not displayed]

FINDINGS: Right arm PICC is unchanged in the mid SVC.  The heart
size and mediastinal contours are stable.  The lungs are now clear
with resolution of previously demonstrated pulmonary edema.  There
is no pleural effusion or pneumothorax.
IMPRESSION: Resolved pulmonary edema.  No acute cardiopulmonary process.

## 2012-06-26 IMAGING — CR DG CHEST 1V PORT
1 series · 1 of 1 positions shown · non-contrast
Comparison: Portable chest x-ray of 01/26/2011

CLINICAL DATA: Postop from lumbar spine surgery

PORTABLE CHEST - 1 VIEW

[view not recorded]
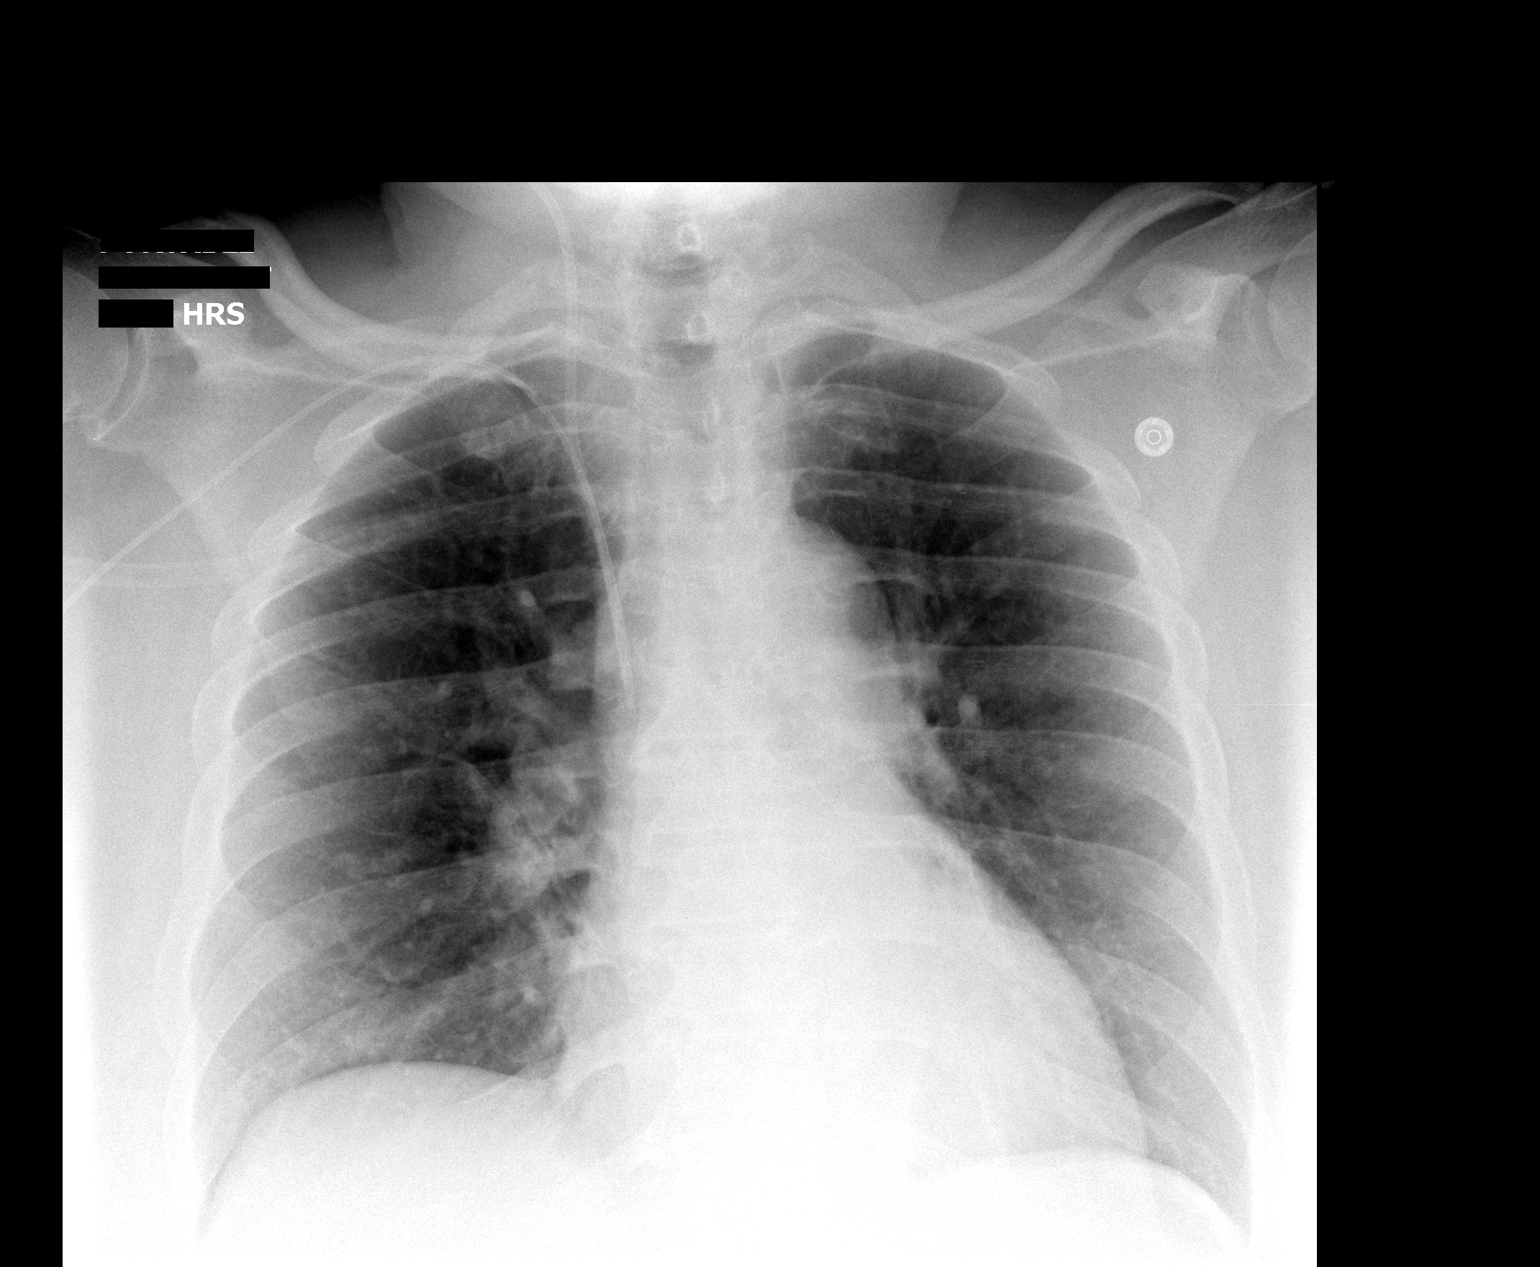

[1 of 1 positions shown; findings below may reference images not displayed]

FINDINGS: A right IJ central venous line has been inserted with the
tip in the upper SVC.  No pneumothorax is seen.  Right central
venous line is unchanged in position with the tip in the mid SVC.
The lungs are clear.  The heart is within upper limits of normal.
No bony abnormality is seen.
IMPRESSION: Right IJ central venous catheter tip in upper SVC.  No
pneumothorax.

## 2012-12-29 ENCOUNTER — Ambulatory Visit (INDEPENDENT_AMBULATORY_CARE_PROVIDER_SITE_OTHER): Payer: BC Managed Care – PPO | Admitting: Surgery

## 2012-12-29 ENCOUNTER — Encounter (INDEPENDENT_AMBULATORY_CARE_PROVIDER_SITE_OTHER): Payer: Self-pay | Admitting: Surgery

## 2012-12-29 DIAGNOSIS — E119 Type 2 diabetes mellitus without complications: Secondary | ICD-10-CM

## 2012-12-29 DIAGNOSIS — Z6841 Body Mass Index (BMI) 40.0 and over, adult: Secondary | ICD-10-CM

## 2012-12-29 DIAGNOSIS — G4733 Obstructive sleep apnea (adult) (pediatric): Secondary | ICD-10-CM

## 2012-12-29 NOTE — Progress Notes (Signed)
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 She is lost T282 but has been unable to lose any further weight. She had a left knee replacement last year and will likely need another right knee replacement by Dr. Paula Libra.  Along the way she had DVT and pulmonary embolism and had a temporary filter placed for her for surgery due which was removed. She subsequently had a cervical disc operation and had a permanent [place] filter placed at that time. Her workup did not reveal any genetic predisposition towards DVT.  She's been our seminars and is very anxious and having a band of her bypass to try to help her lose more weight down into the low 200s. This hopefully will help her significant arthritis. Likely help her knee replacements be more successful.  Past Medical History  Diagnosis Date  . Hypertension   . Arthritis   . Asthma   . Morbid obesity   . PE (pulmonary embolism)   . DVT (deep venous thrombosis)   . GERD (gastroesophageal reflux disease)   . Dysfunctional uterine bleeding   . Headache(784.0)     MIGRAINES IN PAST  . Sleep apnea, obstructive     USES C-PAP  . Glaucoma     Past Surgical History  Procedure Laterality Date  . Posterior laminectomy / decompression cervical spine  01/26/2011  . Glaucoma surgery  10/31/2009  . Gastric bypass  [id number]  . Knee arthroscopy  12/2007  . Dilation and curettage of uterus    . Hysteroscopy      X2  . Eye surgery  11/16/2009    FOR GLAUCOMA AND LASER SURGERY  X2  . Cataract surg    . Ivc filter      2010 - THEN REMOVED THEN PLACED AGAIN 2012  . Total knee arthroplasty  09/09/2011    Procedure: TOTAL KNEE ARTHROPLASTY;  Surgeon: Javier Docker, MD;  Location: [facility];   Service: Orthopedics;  Laterality: Left;  . Capsulotomy  05/03/2012    Procedure: MINOR CAPSULOTOMY;  Surgeon: Vita Erm., MD;  Location: [facility] OR;  Service: Ophthalmology;  Laterality: Right;  . Yag laser application  05/03/2012    Procedure: YAG LASER APPLICATION;  Surgeon: Vita Erm., MD;  Location: [facility] OR;  Service: Ophthalmology;  Laterality: Right;  . Capsulotomy  05/05/2012    Procedure: MINOR CAPSULOTOMY;  Surgeon: Vita Erm., MD;  Location: [facility] OR;  Service: Ophthalmology;  Laterality: Right;  . Yag laser application  05/05/2012    Procedure: YAG LASER APPLICATION;  Surgeon: Vita Erm., MD;  Location: [facility] OR;  Service: Ophthalmology;  Laterality: Right;  . Abdominal hysterectomy      Current Outpatient Prescriptions  Medication Sig Dispense Refill  . ALPRAZolam (XANAX) 0.25 MG tablet Take 0.25 mg by mouth at bedtime as needed. sleep      . b complex vitamins tablet Take 1 tablet by mouth daily.      . bimatoprost (LUMIGAN) 0.01 % SOLN Place 1 drop into both eyes at bedtime.       . brinzolamide (AZOPT) 1 % ophthalmic suspension Place 1 drop into both eyes 3 (three) times daily.       Marland Kitchen  Calcium Carbonate-Vit D-Min (CALTRATE 600+D PLUS MINERALS PO) Take 2 tablets by mouth 2 (two) times daily.      . celecoxib (CELEBREX) 200 MG capsule Take 200 mg by mouth 2 (two) times daily.      . ciclesonide (ALVESCO) 160 MCG/ACT inhaler Inhale 1 puff into the lungs 2 (two) times daily.      . Cyanocobalamin (VITAMIN B-12) 2500 MCG SUBL Place 2 tablets under the tongue at bedtime.      . cyclobenzaprine (FLEXERIL) 10 MG tablet Take 10 mg by mouth at bedtime as needed. FOR MUSCLE SPASMS       . diclofenac sodium (VOLTAREN) 1 % GEL Apply 1 application topically 4 (four) times daily as needed. For joint pain      . diltiazem (DILACOR XR) 240 MG 24 hr capsule Take 240 mg by mouth at bedtime.      Marland Kitchen esomeprazole (NEXIUM) 40 MG capsule Take 40 mg by mouth daily  before breakfast.      . fexofenadine (ALLEGRA) 180 MG tablet Take 180 mg by mouth every morning.       . fish oil-omega-3 fatty acids 1000 MG capsule Take 1 g by mouth 2 (two) times daily.       Marland Kitchen levalbuterol (XOPENEX) 1.25 MG/3ML nebulizer solution Take 1.25 mg by nebulization every 6 (six) hours as needed. For shortness of breath/wheezing      . loratadine-pseudoephedrine (ALAVERT ALLERGY/SINUS) 5-120 MG per tablet Take 1 tablet by mouth 2 (two) times daily. For sinus drainage      . magnesium chloride (SLOW-MAG) 64 MG TBEC Take 1-2 tablets by mouth 2 (two) times daily. Patient takes 2 tablet every morning and 1 tablet with dinner      . mometasone (NASONEX) 50 MCG/ACT nasal spray Place 2 sprays into the nose daily.      . montelukast (SINGULAIR) 10 MG tablet Take 10 mg by mouth at bedtime.      . Multiple Vitamin (MULTIVITAMIN) tablet Take 2 tablets by mouth at bedtime.       . niacin 500 MG tablet Take 500 mg by mouth daily with breakfast.      . Olopatadine HCl (PATADAY) 0.2 % SOLN Apply 1 drop to eye 2 (two) times daily as needed.       Marland Kitchen oxyCODONE (OXYCONTIN) 10 MG 12 hr tablet Take 10 mg by mouth every 12 (twelve) hours as needed. For pain      . potassium chloride SA (K-DUR,KLOR-CON) 20 MEQ tablet Take 20-40 mEq by mouth 2 (two) times daily. 40 meq with breakfast and 20 meq with dinner      . Propylene Glycol (SYSTANE BALANCE) 0.6 % SOLN Place 1 drop into both eyes 3 (three) times daily. In right eye      . protein supplement (RESOURCE BENEPROTEIN) POWD Take 6 g by mouth 3 (three) times daily with meals.      . quiNINE (QUALAQUIN) 324 MG capsule Take 324 mg by mouth at bedtime.       Marland Kitchen QVAR 80 MCG/ACT inhaler       . Rivaroxaban (XARELTO) 20 MG TABS Take 20 mg by mouth daily.      . valsartan-hydrochlorothiazide (DIOVAN-HCT) 80-12.5 MG per tablet Take 1 tablet by mouth daily.      . Vitamin D, Ergocalciferol, (DRISDOL) 50000 UNITS CAPS capsule       . iron polysaccharides (NIFEREX) 150  MG capsule Take 1 capsule (150 mg total) by mouth 2 times daily  at 12 noon and 4 pm.  60 capsule  1   No current facility-administered medications for this visit.   Beta adrenergic blockers; Penicillins; Sulfa antibiotics; and Thimerosal Family History  Problem Relation Age of Onset  . Glaucoma    . Cerebral aneurysm    . Stroke    . Arthritis Mother   . Hypertension Mother   . Diabetes Mother   . Arthritis Father   . Diabetes Father   . Alcohol abuse Father    Social History:   reports that she has never smoked. She has never used smokeless tobacco. She reports that she does not drink alcohol or use illicit drugs.   REVIEW OF SYSTEMS - PERTINENT POSITIVES ONLY: Positive for history of DVT and some asthma. Positive for arthritis. All of systems negative.  Physical Exam:   Blood pressure 140/90, pulse 104, resp. rate 16, height 5' 4.75" (1.645 m), weight 282 lb (127.914 kg). Body mass index is 47.27 kg/(m^2).  Gen:  WDWN African American female NAD  Neurological: Alert and oriented to person, place, and time. Motor and sensory function is grossly intact  Head: Normocephalic and atraumatic.  Eyes: Conjunctivae are normal. Pupils are equal, round, and reactive to light. No scleral icterus.  Neck: Normal range of motion. Neck supple. No tracheal deviation or thyromegaly present.  Cardiovascular:  SR without murmurs or gallops.  No carotid bruits Respiratory: Effort normal.  No respiratory distress. No chest wall tenderness. Breath sounds normal.  No wheezes, rales or rhonchi.  Abdomen:  Obese nontender GU: Musculoskeletal: Normal range of motion. Extremities are nontender. No cyanosis, edema or clubbing noted Lymphadenopathy: No cervical, preauricular, postauricular or axillary adenopathy is present Skin: Skin is warm and dry. No rash noted. No diaphoresis. No erythema. No pallor. Pscyh: Normal mood and affect. Behavior is normal. Judgment and thought content normal.   LABORATORY  RESULTS: No results found for this or any previous visit (from the past 48 hour(s)).  RADIOLOGY RESULTS: No results found.  Problem List: Patient Active Problem List   Diagnosis Date Noted  . Anemia 09/10/2011  . ARF (acute renal failure) 09/10/2011  . Paresthesias 09/10/2011  . Total knee replacement status, left 09/10/2011  . Cervical spondylosis with myelopathy 06/30/2011  . Osteoarthritis of both knees 06/30/2011  . Diabetes mellitus   . Arthritis   . Sleep apnea, obstructive   . Morbid obesity   . Dysfunctional uterine bleeding   . HEPATITIS C 01/25/2008  . HYPERTENSION 01/25/2008  . 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 ASTHMA 01/25/2008  . SLEEP APNEA 01/25/2008  . DYSPNEA 01/25/2008    Assessment & Plan: Prior gastric bypass done 4 years ago with weight plateau and despite many attempts to lose more weight with her gastric bypass is now frustrated and wants to have a band of her bypass. Will likely get an upper GI series to assess the size of her pouch and we'll try to move toward band over bypass to further restrict her and help her lose more weight.    Matt B. [name], MD, [facility] [phone] beeper [phone]  12/29/2012 11:52 AM

## 2012-12-29 NOTE — Patient Instructions (Signed)

## 2013-01-13 ENCOUNTER — Encounter (HOSPITAL_BASED_OUTPATIENT_CLINIC_OR_DEPARTMENT_OTHER): Payer: BC Managed Care – PPO

## 2013-01-17 ENCOUNTER — Ambulatory Visit (HOSPITAL_COMMUNITY)
Admission: RE | Admit: 2013-01-17 | Discharge: 2013-01-17 | Disposition: A | Discharge status: Home / Self Care | Payer: BC Managed Care – PPO | Source: Ambulatory Visit | Attending: Surgery | Admitting: Surgery

## 2013-01-17 ENCOUNTER — Encounter (INDEPENDENT_AMBULATORY_CARE_PROVIDER_SITE_OTHER): Payer: Self-pay

## 2013-01-17 ENCOUNTER — Other Ambulatory Visit: Payer: Self-pay

## 2013-01-17 DIAGNOSIS — I10 Essential (primary) hypertension: Secondary | ICD-10-CM | POA: Insufficient documentation

## 2013-01-17 DIAGNOSIS — Z86711 Personal history of pulmonary embolism: Secondary | ICD-10-CM | POA: Insufficient documentation

## 2013-01-17 DIAGNOSIS — Z9884 Bariatric surgery status: Secondary | ICD-10-CM | POA: Insufficient documentation

## 2013-01-17 DIAGNOSIS — G4733 Obstructive sleep apnea (adult) (pediatric): Secondary | ICD-10-CM | POA: Insufficient documentation

## 2013-01-17 DIAGNOSIS — Z6841 Body Mass Index (BMI) 40.0 and over, adult: Secondary | ICD-10-CM | POA: Insufficient documentation

## 2013-01-17 DIAGNOSIS — N938 Other specified abnormal uterine and vaginal bleeding: Secondary | ICD-10-CM | POA: Insufficient documentation

## 2013-01-17 DIAGNOSIS — N925 Other specified irregular menstruation: Secondary | ICD-10-CM | POA: Insufficient documentation

## 2013-01-17 DIAGNOSIS — D649 Anemia, unspecified: Secondary | ICD-10-CM | POA: Insufficient documentation

## 2013-01-17 DIAGNOSIS — N179 Acute kidney failure, unspecified: Secondary | ICD-10-CM | POA: Insufficient documentation

## 2013-01-17 DIAGNOSIS — E119 Type 2 diabetes mellitus without complications: Secondary | ICD-10-CM | POA: Insufficient documentation

## 2013-01-17 DIAGNOSIS — R209 Unspecified disturbances of skin sensation: Secondary | ICD-10-CM | POA: Insufficient documentation

## 2013-01-17 DIAGNOSIS — M129 Arthropathy, unspecified: Secondary | ICD-10-CM | POA: Insufficient documentation

## 2013-01-17 DIAGNOSIS — N949 Unspecified condition associated with female genital organs and menstrual cycle: Secondary | ICD-10-CM | POA: Insufficient documentation

## 2013-01-17 DIAGNOSIS — K224 Dyskinesia of esophagus: Secondary | ICD-10-CM | POA: Insufficient documentation

## 2013-01-17 DIAGNOSIS — J45909 Unspecified asthma, uncomplicated: Secondary | ICD-10-CM | POA: Insufficient documentation

## 2013-01-17 DIAGNOSIS — K449 Diaphragmatic hernia without obstruction or gangrene: Secondary | ICD-10-CM | POA: Insufficient documentation

## 2013-01-17 DIAGNOSIS — K219 Gastro-esophageal reflux disease without esophagitis: Secondary | ICD-10-CM | POA: Insufficient documentation

## 2013-01-24 ENCOUNTER — Ambulatory Visit: Payer: BC Managed Care – PPO | Admitting: Dietician

## 2013-01-25 LAB — CBC WITH DIFFERENTIAL/PLATELET
Basophils Absolute: 0 10*3/uL (ref 0.0–0.1)
Basophils Relative: 1 % (ref 0–1)
Eosinophils Absolute: 0.3 10*3/uL (ref 0.0–0.7)
Eosinophils Relative: 5 % (ref 0–5)
HCT: 37.2 % (ref 36.0–46.0)
Hemoglobin: 13 g/dL (ref 12.0–15.0)
Lymphocytes Relative: 34 % (ref 12–46)
Lymphs Abs: 2.1 10*3/uL (ref 0.7–4.0)
MCH: 27.7 pg (ref 26.0–34.0)
MCHC: 34.9 g/dL (ref 30.0–36.0)
MCV: 79.1 fL (ref 78.0–100.0)
Monocytes Absolute: 0.5 10*3/uL (ref 0.1–1.0)
Monocytes Relative: 9 % (ref 3–12)
Neutro Abs: 3.1 10*3/uL (ref 1.7–7.7)
Neutrophils Relative %: 51 % (ref 43–77)
Platelets: 152 10*3/uL (ref 150–400)
RBC: 4.7 MIL/uL (ref 3.87–5.11)
RDW: 15 % (ref 11.5–15.5)
WBC: 6 10*3/uL (ref 4.0–10.5)

## 2013-01-25 LAB — TSH: TSH: 3.134 u[IU]/mL (ref 0.350–4.500)

## 2013-01-25 LAB — T4: T4, Total: 8.9 ug/dL (ref 5.0–12.5)

## 2013-01-25 LAB — HEMOGLOBIN A1C
Hgb A1c MFr Bld: 5.7 % — ABNORMAL HIGH (ref <5.7)
Mean Plasma Glucose: 117 mg/dL — ABNORMAL HIGH (ref <117)

## 2013-01-26 ENCOUNTER — Ambulatory Visit (HOSPITAL_BASED_OUTPATIENT_CLINIC_OR_DEPARTMENT_OTHER): Payer: BC Managed Care – PPO | Attending: Surgery

## 2013-01-30 ENCOUNTER — Encounter (HOSPITAL_BASED_OUTPATIENT_CLINIC_OR_DEPARTMENT_OTHER): Payer: BC Managed Care – PPO

## 2013-02-02 ENCOUNTER — Ambulatory Visit (HOSPITAL_BASED_OUTPATIENT_CLINIC_OR_DEPARTMENT_OTHER): Payer: BC Managed Care – PPO | Attending: Surgery

## 2013-02-08 ENCOUNTER — Encounter: Payer: Self-pay | Admitting: Dietician

## 2013-02-08 ENCOUNTER — Encounter: Payer: BC Managed Care – PPO | Attending: Surgery | Admitting: Dietician

## 2013-02-08 DIAGNOSIS — Z713 Dietary counseling and surveillance: Secondary | ICD-10-CM | POA: Insufficient documentation

## 2013-02-08 NOTE — Progress Notes (Signed)
  Pre-Op Assessment Visit:  Pre-Operative LAGB Surgery  Medical Nutrition Therapy:  Appt start time: 0830   End time:  0915.  Patient was seen on 02/08/2013 for Pre-Operative LAGB Nutrition Assessment. Assessment and letter of approval faxed to [facility] Bariatric Surgery Program coordinator on 02/08/13.   Handouts given during visit include:  Pre-Op Goals Bariatric Surgery Protein Shakes  Patient to call the [facility] to enroll in Pre-Op and Post-Op Nutrition Education when surgery date is scheduled.

## 2013-02-08 NOTE — Patient Instructions (Signed)
Patient to call the [facility] to enroll in Pre-Op and Post-Op Nutrition Education when surgery date is scheduled. 

## 2013-02-15 ENCOUNTER — Ambulatory Visit (HOSPITAL_BASED_OUTPATIENT_CLINIC_OR_DEPARTMENT_OTHER): Payer: BC Managed Care – PPO | Attending: Surgery | Admitting: Radiology

## 2013-02-15 DIAGNOSIS — R059 Cough, unspecified: Secondary | ICD-10-CM | POA: Insufficient documentation

## 2013-02-15 DIAGNOSIS — G4733 Obstructive sleep apnea (adult) (pediatric): Secondary | ICD-10-CM | POA: Insufficient documentation

## 2013-02-15 DIAGNOSIS — R05 Cough: Secondary | ICD-10-CM | POA: Insufficient documentation

## 2013-02-18 NOTE — Procedures (Signed)
NAME:  [name], [name]               ACCOUNT NO.:  [id number]  MEDICAL RECORD NO.:  [id number]          PATIENT TYPE:  OUT  LOCATION:  SLEEP CENTER                 FACILITY:  [facility]  PHYSICIAN:  Perry Molla D. Maple Hudson, MD, FCCP, FACPDATE OF BIRTH:  [birth year]  DATE OF STUDY:  02/15/2013                           NOCTURNAL POLYSOMNOGRAM  REFERRING PHYSICIAN:  Thornton Park. [name], MD  INDICATION FOR STUDY:  Hypersomnia with sleep apnea.  EPWORTH SLEEPINESS SCORE:  4/24.  BMI 47, weight 280 pounds, height 64.7 inches, neck 16 inches.  MEDICATIONS:  Home medications are charted for review.  Bedtime medication:  Flexeril, Qualaquin, Xarelto, diltiazem XT, Klor-Con, Singulair, Xanax, Slow-Mag, Centrum vitamins, Claritin, QVAR, Xopenex, Ferrex.  SLEEP ARCHITECTURE:  Total sleep time 293.5 minutes, with sleep efficiency 74.7%.  Stage I was 10.9%, stage II 79.7%, stage III absent, REM 9.4% of total sleep time.  Sleep latency 7 minutes.  REM latency 297 minutes.  Awake after sleep onset 93 minutes.  Arousal index 14.1.  RESPIRATORY DATA:  Apnea-hypopnea index (AHI) 11.9 per hour.  A total of 58 events was scored including 14 obstructive apneas and 44 hypopneas. Events were mostly associated with supine sleep position and REM.  REM AHI 54.5 per hour.  There were not enough early events to qualify for split protocol CPAP titration on this study.  OXYGEN DATA:  Mild to moderately loud snoring, with oxygen desaturation to a nadir of 83% and mean oxygen saturation through the study of 94.5% on room air.  CARDIAC DATA:  Sinus rhythm with occasional PAC and PVC.  MOVEMENT/PARASOMNIA:  No significant movement disturbance.  Bathroom x3. Frequent coughing throughout the night.  She took a decaffeinated tea to soothe her throat.  IMPRESSION/RECOMMENDATION: 1. The patient wore an oral appliance throughout the study.  She had     persistent cough attributed to postnasal drip, for which she took a  Caffeine-free hot tea to soothe her throat. 2. Mild obstructive sleep apnea/hypopnea syndrome, AHI 11.9 per hour.     Events were seen in all sleep positions, especially while supine.     Mild-to-moderately loud snoring with oxygen desaturation to a nadir     of 83% and mean oxygen saturation through the study of 94.5% on     room air. 3. There were not enough early events to qualify for split protocol     CPAP titration on this study.  She can return     for dedicated CPAP titration study if needed. 4. Somniloquy (sleep talking) was noted while in REM.     Safir Michalec D. Maple Hudson, MD, [facility], FACP Diplomate, American Board of Sleep Medicine    CDY/MEDQ  D:  02/18/2013 10:37:48  T:  02/18/2013 22:45:41  Job:  [id number]

## 2013-03-13 ENCOUNTER — Encounter (HOSPITAL_BASED_OUTPATIENT_CLINIC_OR_DEPARTMENT_OTHER): Payer: BC Managed Care – PPO

## 2013-10-01 ENCOUNTER — Encounter (HOSPITAL_COMMUNITY): Payer: Self-pay | Admitting: Emergency Medicine

## 2013-10-01 ENCOUNTER — Emergency Department (HOSPITAL_COMMUNITY)
Admission: EM | Admit: 2013-10-01 | Discharge: 2013-10-01 | Disposition: A | Discharge status: Home / Self Care | Payer: BC Managed Care – PPO | Attending: Emergency Medicine | Admitting: Emergency Medicine

## 2013-10-01 DIAGNOSIS — Z880 Allergy status to penicillin: Secondary | ICD-10-CM | POA: Insufficient documentation

## 2013-10-01 DIAGNOSIS — Z9981 Dependence on supplemental oxygen: Secondary | ICD-10-CM | POA: Insufficient documentation

## 2013-10-01 DIAGNOSIS — IMO0002 Reserved for concepts with insufficient information to code with codable children: Secondary | ICD-10-CM | POA: Insufficient documentation

## 2013-10-01 DIAGNOSIS — M7989 Other specified soft tissue disorders: Secondary | ICD-10-CM

## 2013-10-01 DIAGNOSIS — K219 Gastro-esophageal reflux disease without esophagitis: Secondary | ICD-10-CM | POA: Insufficient documentation

## 2013-10-01 DIAGNOSIS — G4733 Obstructive sleep apnea (adult) (pediatric): Secondary | ICD-10-CM | POA: Insufficient documentation

## 2013-10-01 DIAGNOSIS — J45909 Unspecified asthma, uncomplicated: Secondary | ICD-10-CM | POA: Insufficient documentation

## 2013-10-01 DIAGNOSIS — M79609 Pain in unspecified limb: Secondary | ICD-10-CM

## 2013-10-01 DIAGNOSIS — R42 Dizziness and giddiness: Secondary | ICD-10-CM | POA: Insufficient documentation

## 2013-10-01 DIAGNOSIS — Z791 Long term (current) use of non-steroidal anti-inflammatories (NSAID): Secondary | ICD-10-CM | POA: Insufficient documentation

## 2013-10-01 DIAGNOSIS — Z86711 Personal history of pulmonary embolism: Secondary | ICD-10-CM | POA: Insufficient documentation

## 2013-10-01 DIAGNOSIS — I10 Essential (primary) hypertension: Secondary | ICD-10-CM | POA: Insufficient documentation

## 2013-10-01 DIAGNOSIS — H409 Unspecified glaucoma: Secondary | ICD-10-CM | POA: Insufficient documentation

## 2013-10-01 DIAGNOSIS — R252 Cramp and spasm: Secondary | ICD-10-CM

## 2013-10-01 DIAGNOSIS — Z8742 Personal history of other diseases of the female genital tract: Secondary | ICD-10-CM | POA: Insufficient documentation

## 2013-10-01 DIAGNOSIS — M129 Arthropathy, unspecified: Secondary | ICD-10-CM | POA: Insufficient documentation

## 2013-10-01 DIAGNOSIS — Z86718 Personal history of other venous thrombosis and embolism: Secondary | ICD-10-CM | POA: Insufficient documentation

## 2013-10-01 LAB — CBC WITH DIFFERENTIAL/PLATELET
Basophils Absolute: 0 10*3/uL (ref 0.0–0.1)
Basophils Relative: 1 % (ref 0–1)
Eosinophils Absolute: 0.3 10*3/uL (ref 0.0–0.7)
Eosinophils Relative: 5 % (ref 0–5)
HCT: 37.8 % (ref 36.0–46.0)
Hemoglobin: 12.8 g/dL (ref 12.0–15.0)
Lymphocytes Relative: 28 % (ref 12–46)
Lymphs Abs: 1.6 10*3/uL (ref 0.7–4.0)
MCH: 26.6 pg (ref 26.0–34.0)
MCHC: 33.9 g/dL (ref 30.0–36.0)
MCV: 78.4 fL (ref 78.0–100.0)
Monocytes Absolute: 0.5 10*3/uL (ref 0.1–1.0)
Monocytes Relative: 8 % (ref 3–12)
Neutro Abs: 3.3 10*3/uL (ref 1.7–7.7)
Neutrophils Relative %: 58 % (ref 43–77)
Platelets: 133 10*3/uL — ABNORMAL LOW (ref 150–400)
RBC: 4.82 MIL/uL (ref 3.87–5.11)
RDW: 15.6 % — ABNORMAL HIGH (ref 11.5–15.5)
WBC: 5.6 10*3/uL (ref 4.0–10.5)

## 2013-10-01 LAB — BASIC METABOLIC PANEL
BUN: 15 mg/dL (ref 6–23)
CO2: 24 mEq/L (ref 19–32)
Calcium: 9.4 mg/dL (ref 8.4–10.5)
Chloride: 106 mEq/L (ref 96–112)
Creatinine, Ser: 1.01 mg/dL (ref 0.50–1.10)
GFR calc Af Amer: 72 mL/min — ABNORMAL LOW (ref >90)
GFR calc non Af Amer: 62 mL/min — ABNORMAL LOW (ref >90)
Glucose, Bld: 86 mg/dL (ref 70–99)
Potassium: 3.8 mEq/L (ref 3.7–5.3)
Sodium: 143 mEq/L (ref 137–147)

## 2013-10-01 LAB — URINALYSIS, ROUTINE W REFLEX MICROSCOPIC
Bilirubin Urine: NEGATIVE
Glucose, UA: NEGATIVE mg/dL
Hgb urine dipstick: NEGATIVE
Ketones, ur: NEGATIVE mg/dL
Leukocytes, UA: NEGATIVE
Nitrite: NEGATIVE
Protein, ur: NEGATIVE mg/dL
Specific Gravity, Urine: 1.012 (ref 1.005–1.030)
Urobilinogen, UA: 0.2 mg/dL (ref 0.0–1.0)
pH: 5 (ref 5.0–8.0)

## 2013-10-01 LAB — PROTIME-INR
INR: 1.29 (ref 0.00–1.49)
Prothrombin Time: 15.8 seconds — ABNORMAL HIGH (ref 11.6–15.2)

## 2013-10-01 MED ORDER — TRAMADOL HCL 50 MG PO TABS: 50.0000 mg | ORAL_TABLET | Freq: Four times a day (QID) | ORAL | Status: DC | PRN

## 2013-10-01 MED ORDER — SODIUM CHLORIDE 0.9 % IV BOLUS (SEPSIS)
500.0000 mL | Freq: Once | INTRAVENOUS | Status: AC
  Administered 2013-10-01: 500 mL via INTRAVENOUS

## 2013-10-01 MED ORDER — MORPHINE SULFATE 4 MG/ML IJ SOLN
4.0000 mg | Freq: Once | INTRAMUSCULAR | Status: AC
  Administered 2013-10-01: 4 mg via INTRAVENOUS
  Filled 2013-10-01: qty 1

## 2013-10-01 NOTE — ED Notes (Signed)
Jen, PA at bedside.

## 2013-10-01 NOTE — Discharge Instructions (Signed)
Please follow up with your primary care physician in 1-2 days. If you do not have one please call the [facility] number listed above. Please take your Quinine as prescribed by your primary care physician. Please take pain medication and/or muscle relaxants as prescribed and as needed for pain. Please do not drive on narcotic pain medication or on muscle relaxants. Please read all discharge instructions and return precautions.    Leg Cramps Leg cramps that occur during exercise can be caused by poor circulation or dehydration. However, muscle cramps that occur at rest or during the night are usually not due to any serious medical problem. Heat cramps may cause muscle spasms during hot weather.  CAUSES There is no clear cause for muscle cramps. However, dehydration may be a factor for those who do not drink enough fluids and those who exercise in the heat. Imbalances in the level of sodium, potassium, calcium or magnesium in the muscle tissue may also be a factor. Some medications, such as water pills (diuretics), may cause loss of chemicals that the body needs (like sodium and potassium) and cause muscle cramps. TREATMENT   Make sure your diet has enough fluids and essential minerals for the muscle to work normally.  Avoid strenuous exercise for several days if you have been having frequent leg cramps.  Stretch and massage the cramped muscle for several minutes.  Some medicines may be helpful in some patients with night cramps. Only take over-the-counter or prescription medicines as directed by your caregiver. SEEK IMMEDIATE MEDICAL CARE IF:   Your leg cramps become worse.  Your foot becomes cold, numb, or blue. Document Released: 05/14/2004 Document Revised: 06/29/2011 Document Reviewed: 05/01/2008 [facility] Patient Information 2014 [place].  Muscle Cramps and Spasms Muscle cramps and spasms occur when a muscle or muscles tighten and you have no control over this  tightening (involuntary muscle contraction). They are a common problem and can develop in any muscle. The most common place is in the calf muscles of the leg. Both muscle cramps and muscle spasms are involuntary muscle contractions, but they also have differences:   Muscle cramps are sporadic and painful. They may last a few seconds to a quarter of an hour. Muscle cramps are often more forceful and last longer than muscle spasms.  Muscle spasms may or may not be painful. They may also last just a few seconds or much longer. CAUSES  It is uncommon for cramps or spasms to be due to a serious underlying problem. In many cases, the cause of cramps or spasms is unknown. Some common causes are:   Overexertion.   Overuse from repetitive motions (doing the same thing over and over).   Remaining in a certain position for a long period of time.   Improper preparation, form, or technique while performing a sport or activity.   Dehydration.   Injury.   Side effects of some medicines.   Abnormally low levels of the salts and ions in your blood (electrolytes), especially potassium and calcium. This could happen if you are taking water pills (diuretics) or you are pregnant.  Some underlying medical problems can make it more likely to develop cramps or spasms. These include, but are not limited to:   Diabetes.   Parkinson disease.   Hormone disorders, such as thyroid problems.   Alcohol abuse.   Diseases specific to muscles, joints, and bones.   Blood vessel disease where not enough blood is getting to the muscles.  HOME  CARE INSTRUCTIONS   Stay well hydrated. Drink enough water and fluids to keep your urine clear or pale yellow.  It may be helpful to massage, stretch, and relax the affected muscle.  For tight or tense muscles, use a warm towel, heating pad, or hot shower water directed to the affected area.  If you are sore or have pain after a cramp or spasm, applying ice  to the affected area may relieve discomfort.  Put ice in a plastic bag.  Place a towel between your skin and the bag.  Leave the ice on for 15-20 minutes, 03-04 times a day.  Medicines used to treat a known cause of cramps or spasms may help reduce their frequency or severity. Only take over-the-counter or prescription medicines as directed by your caregiver. SEEK MEDICAL CARE IF:  Your cramps or spasms get more severe, more frequent, or do not improve over time.  MAKE SURE YOU:   Understand these instructions.  Will watch your condition.  Will get help right away if you are not doing well or get worse. Document Released: 09/26/2001 Document Revised: 08/01/2012 Document Reviewed: 03/23/2012 [facility] Patient Information 2014 [place], [place].

## 2013-10-01 NOTE — ED Provider Notes (Signed)
CSN: [id number]     Arrival date & time 10/01/13  4562 History   First MD Initiated Contact with Patient 10/01/13 1128     Chief Complaint  Patient presents with  . Leg Pain  . Leg Swelling  . 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 Alleviating factors: none. Aggravating factors: none. Medications tried prior to arrival: Quinine, Tonic water. Patient states she is concerned about possible DVT return. Denies any fevers, chills, nausea, vomiting, diarrhea, urinary symptoms, chest pain, shortness of breath, abdominal pain.    Patient is a 53 y.o. female presenting with leg pain and dizziness.  Leg Pain Associated symptoms: no fever   Dizziness Associated symptoms: no nausea and no vomiting     Past Medical History  Diagnosis Date  . Hypertension   . Arthritis   . Asthma   . Morbid obesity   . PE (pulmonary embolism)   . DVT (deep venous thrombosis)   . GERD (gastroesophageal reflux disease)   . Dysfunctional uterine bleeding   . Headache(784.0)     MIGRAINES IN PAST  . Sleep apnea, obstructive     USES C-PAP  . Glaucoma    Past Surgical History  Procedure Laterality Date  . Posterior laminectomy / decompression cervical spine  01/26/2011  . Glaucoma surgery  10/31/2009  . Gastric bypass  [id number]  . Knee arthroscopy  12/2007  . Dilation and curettage of uterus    . Hysteroscopy      X2  . Eye surgery  11/16/2009    FOR GLAUCOMA AND LASER SURGERY  X2  . Cataract surg    . Ivc filter      2010 - THEN REMOVED THEN PLACED AGAIN 2012  . Total knee arthroplasty  09/09/2011    Procedure: TOTAL KNEE  ARTHROPLASTY;  Surgeon: Johnn Hai, MD;  Location: [facility];  Service: Orthopedics;  Laterality: Left;  . Capsulotomy  05/03/2012    Procedure: MINOR CAPSULOTOMY;  Surgeon: Myrtha Mantis., MD;  Location: [place];  Service: Ophthalmology;  Laterality: Right;  . Yag laser application  08/16/7679    Procedure: YAG LASER APPLICATION;  Surgeon: Myrtha Mantis., MD;  Location: [place];  Service: Ophthalmology;  Laterality: Right;  . Capsulotomy  05/05/2012    Procedure: MINOR CAPSULOTOMY;  Surgeon: Myrtha Mantis., MD;  Location: [place];  Service: Ophthalmology;  Laterality: Right;  . Yag laser application  1/57/2620    Procedure: YAG LASER APPLICATION;  Surgeon: Myrtha Mantis., MD;  Location: [facility];  Service: Ophthalmology;  Laterality: Right;  . Abdominal hysterectomy     Family History  Problem Relation Age of Onset  . Glaucoma    . Cerebral aneurysm    . Stroke    . Arthritis Mother   . Hypertension Mother   . Diabetes Mother   . Arthritis Father   . Diabetes Father   . Alcohol abuse Father    History  Substance Use Topics  . Smoking status: Never Smoker   . Smokeless tobacco: Never Used  . Alcohol Use: No   OB History   Grav Para Term Preterm Abortions TAB SAB Ect Mult Living  Review of Systems  Constitutional: Negative for fever and chills.  Gastrointestinal: Negative for nausea, vomiting and abdominal pain.  Musculoskeletal: Positive for myalgias (cramping).  Neurological: Negative for dizziness, syncope, weakness, light-headedness and numbness.  All other systems reviewed and are negative.     Allergies  Beta adrenergic blockers; Penicillins; Sulfa antibiotics; and Thimerosal  Home Medications   Prior to Admission medications   Medication Sig Start Date End Date Taking? Authorizing Provider  ALPRAZolam (XANAX) 0.25 MG tablet Take 0.25 mg by mouth at bedtime as needed. sleep    Historical Provider, MD  b complex vitamins  tablet Take 1 tablet by mouth daily.    Historical Provider, MD  bimatoprost (LUMIGAN) 0.01 % SOLN Place 1 drop into both eyes at bedtime.     Lucianne Lei, MD  brinzolamide (AZOPT) 1 % ophthalmic suspension Place 1 drop into both eyes 3 (three) times daily.     Lucianne Lei, MD  Calcium Carbonate-Vit D-Min (CALTRATE 600+D PLUS MINERALS PO) Take 2 tablets by mouth 2 (two) times daily.    Historical Provider, MD  celecoxib (CELEBREX) 200 MG capsule Take 200 mg by mouth 2 (two) times daily.    Historical Provider, MD  ciclesonide (ALVESCO) 160 MCG/ACT inhaler Inhale 1 puff into the lungs 2 (two) times daily.    Historical Provider, MD  Cyanocobalamin (VITAMIN B-12) 2500 MCG SUBL Place 2 tablets under the tongue at bedtime.    Historical Provider, MD  cyclobenzaprine (FLEXERIL) 10 MG tablet Take 10 mg by mouth at bedtime as needed. FOR MUSCLE SPASMS     Lucianne Lei, MD  diclofenac sodium (VOLTAREN) 1 % GEL Apply 1 application topically 4 (four) times daily as needed. For joint pain    Historical Provider, MD  diltiazem (DILACOR XR) 240 MG 24 hr capsule Take 240 mg by mouth at bedtime.    Historical Provider, MD  esomeprazole (NEXIUM) 40 MG capsule Take 40 mg by mouth daily before breakfast.    Historical Provider, MD  fexofenadine (ALLEGRA) 180 MG tablet Take 180 mg by mouth every morning.     Historical Provider, MD  fish oil-omega-3 fatty acids 1000 MG capsule Take 1 g by mouth 2 (two) times daily.     Historical Provider, MD  iron polysaccharides (NIFEREX) 150 MG capsule Take 1 capsule (150 mg total) by mouth 2 times daily at 12 noon and 4 pm. 09/22/11 09/21/12  Ivan Anchors Love, PA-C  levalbuterol (XOPENEX) 1.25 MG/3ML nebulizer solution Take 1.25 mg by nebulization every 6 (six) hours as needed. For shortness of breath/wheezing    Historical Provider, MD  loratadine-pseudoephedrine (ALAVERT ALLERGY/SINUS) 5-120 MG per tablet Take 1 tablet by mouth 2 (two) times daily. For sinus drainage    Historical  Provider, MD  magnesium chloride (SLOW-MAG) 64 MG TBEC Take 1-2 tablets by mouth 2 (two) times daily. Patient takes 2 tablet every morning and 1 tablet with dinner    Historical Provider, MD  mometasone (NASONEX) 50 MCG/ACT nasal spray Place 2 sprays into the nose daily.    Historical Provider, MD  montelukast (SINGULAIR) 10 MG tablet Take 10 mg by mouth at bedtime.    Historical Provider, MD  Multiple Vitamin (MULTIVITAMIN) tablet Take 2 tablets by mouth at bedtime.     Historical Provider, MD  niacin 500 MG tablet Take 500 mg by mouth daily with breakfast.    Lucianne Lei, MD  Olopatadine HCl (PATADAY) 0.2 % SOLN Apply 1 drop to eye 2 (two) times daily as  needed.     Historical Provider, MD  oxyCODONE (OXYCONTIN) 10 MG 12 hr tablet Take 10 mg by mouth every 12 (twelve) hours as needed. For pain    Historical Provider, MD  potassium chloride SA (K-DUR,KLOR-CON) 20 MEQ tablet Take 20-40 mEq by mouth 2 (two) times daily. 40 meq with breakfast and 20 meq with dinner    Historical Provider, MD  Propylene Glycol (SYSTANE BALANCE) 0.6 % SOLN Place 1 drop into both eyes 3 (three) times daily. In right eye    Historical Provider, MD  protein supplement (RESOURCE BENEPROTEIN) POWD Take 6 g by mouth 3 (three) times daily with meals. 09/22/11   Bary Leriche, PA-C  quiNINE (QUALAQUIN) 324 MG capsule Take 324 mg by mouth at bedtime.     Lucianne Lei, MD  QVAR 80 MCG/ACT inhaler  10/31/12   Historical Provider, MD  Rivaroxaban (XARELTO) 20 MG TABS Take 20 mg by mouth daily.    Historical Provider, MD  valsartan-hydrochlorothiazide (DIOVAN-HCT) 80-12.5 MG per tablet Take 1 tablet by mouth daily.    Historical Provider, MD  Vitamin D, Ergocalciferol, (DRISDOL) 50000 UNITS CAPS capsule  12/06/12   Historical Provider, MD   BP 142/85  Pulse 79  Temp(Src) 98 F (36.7 C) (Oral)  Resp 18  Wt 289 lb (131.09 kg)  SpO2 97% Physical Exam  Nursing note and vitals reviewed. Constitutional: She is oriented to person,  place, and time. She appears well-developed and well-nourished. No distress.  Exam limited by body habitus.    HENT:  Head: Normocephalic and atraumatic.  Right Ear: External ear normal.  Left Ear: External ear normal.  Nose: Nose normal.  Mouth/Throat: Oropharynx is clear and moist. No oropharyngeal exudate.  Eyes: Conjunctivae are normal.  Neck: Normal range of motion. Neck supple.  Cardiovascular: Normal rate, regular rhythm, normal heart sounds and intact distal pulses.   Pulmonary/Chest: Effort normal and breath sounds normal. No respiratory distress.  Abdominal: Soft. Bowel sounds are normal. She exhibits no distension. There is no tenderness. There is no rebound and no guarding.  Musculoskeletal: Normal range of motion. She exhibits edema (bilateral non-pitting).  Neurological: She is alert and oriented to person, place, and time. She has normal strength. GCS eye subscore is 4. GCS verbal subscore is 5. GCS motor subscore is 6.  Sensation grossly intact.   Skin: Skin is warm and dry. She is not diaphoretic.  Psychiatric: She has a normal mood and affect.    ED Course  Procedures (including critical care time) Medications  sodium chloride 0.9 % bolus 500 mL (500 mLs Intravenous New Bag/Given 10/01/13 1219)  morphine 4 MG/ML injection 4 mg (4 mg Intravenous Given 10/01/13 1219)    Labs Review Labs Reviewed - No data to display  Imaging Review No results found.   EKG Interpretation None     Filed: 10/01/2013 12:44 PM Note Time: 10/01/2013 12:44 PM Status: Signed   Editor: Iantha Fallen, RVS (Cardiovascular Sonographer)      VASCULAR LAB  PRELIMINARY PRELIMINARY PRELIMINARY PRELIMINARY  Bilateral lower extremity venous Dopplers completed.  Preliminary report: There is no DVT or SVT noted in the bilateral lower extremities.  KANADY, CANDACE, RVT  10/01/2013, 12:44 PM    MDM   Final diagnoses:  Leg cramping    Filed Vitals:   10/01/13 1156  BP: 129/76  Pulse:  76  Temp:   Resp: 18    Afebrile, NAD, non-toxic appearing, AAOx4. Neurovascularly intact. Normal sensation. ROM intact. No erythema  or warmth to suggest infectious etiology of cramping. Venous Dopplers negative for any evidence of DVT or SVT. Screening laboratory tests unremarkable. Symptoms likely related to chronic leg cramping. Patient does mention emergency department. Advised PCP followup to discuss further leg cramping. Patient is stable at time of discharge. Patient d/w with Dr. Audie Pinto, agrees with plan.       Harlow Mares, PA-C 10/01/13 1438

## 2013-10-01 NOTE — ED Notes (Signed)
Pt reports bilateral leg cramping x several days. Having leg swelling and feeling lightheaded. No acute distress noted at triage.

## 2013-10-01 NOTE — Progress Notes (Signed)
VASCULAR LAB PRELIMINARY  PRELIMINARY  PRELIMINARY  PRELIMINARY  Bilateral lower extremity venous Dopplers completed.    Preliminary report:  There is no DVT or SVT noted in the bilateral lower extremities.   Deavion Strider, RVT 10/01/2013, 12:44 PM

## 2013-10-05 NOTE — ED Provider Notes (Signed)
Medical screening examination/treatment/procedure(s) were performed by non-physician practitioner and as supervising physician I was immediately available for consultation/collaboration.    Dot Lanes, MD 10/05/13 [phone]

## 2014-01-15 ENCOUNTER — Encounter: Payer: BC Managed Care – PPO | Attending: Surgery

## 2014-01-15 DIAGNOSIS — Z6841 Body Mass Index (BMI) 40.0 and over, adult: Secondary | ICD-10-CM | POA: Insufficient documentation

## 2014-01-15 DIAGNOSIS — Z01818 Encounter for other preprocedural examination: Secondary | ICD-10-CM | POA: Diagnosis not present

## 2014-01-15 DIAGNOSIS — Z713 Dietary counseling and surveillance: Secondary | ICD-10-CM | POA: Insufficient documentation

## 2014-01-16 NOTE — Progress Notes (Signed)
  Pre-Operative Nutrition Class:  Appt start time: 8341   End time:  1830.  Patient was seen on 01/15/14 for Pre-Operative Bariatric Surgery Education at [facility].   Surgery date: 01/30/14 Surgery type: LAGB (banding RYGB pouch) [facility] weight [facility]: 290 lbs on 02/08/14 Weight today: 293 lbs  TANITA  BODY COMP RESULTS  01/15/14   BMI (kg/m^2) 49.5   Fat Mass (lbs) 132.5   Fat Free Mass (lbs) 160.5   Total Body Water (lbs) 117.5   Samples given per MNT protocol. Patient educated on appropriate usage: Premier protein shake (strawberry - qty 1) Lot #: [id number] Exp: 03/2014  Unjury protein powder (strawberry - qty 1) Lot #: [id number] Exp: 11/2014  Celebrate Vitamins Multivitamin (orange - qty 1) Lot #: [id number] Exp: 07/2014  PB2 (qty 1) Lot #: [id number] Exp: 11/2014  Bariactiv Multivitamin (qty 1) Lot #: [id number] Exp: 08/2014   The following the learning objectives were met by the patient during this course:  Identify Pre-Op Dietary Goals and will begin 2 weeks pre-operatively  Identify appropriate sources of fluids and proteins   State protein recommendations and appropriate sources pre and post-operatively  Identify Post-Operative Dietary Goals and will follow for 2 weeks post-operatively  Identify appropriate multivitamin and calcium sources  Describe the need for physical activity post-operatively and will follow MD recommendations  State when to call healthcare provider regarding medication questions or post-operative complications  Handouts given during class include:  Pre-Op Bariatric Surgery Diet Handout  Protein Shake Handout  Post-Op Bariatric Surgery Nutrition Handout  [facility]EL[facility] Program Information Flyer  Support Group Information Flyer  [facility] Bariatric Supplements Price List  Follow-Up Plan: Patient will follow-up at [facility] 2 weeks post operatively for diet advancement per MD.

## 2014-01-17 NOTE — Progress Notes (Signed)
Please put orders in Epic surgery 01-30-14 pre op 01-24-14 Thanks

## 2014-01-19 ENCOUNTER — Encounter (HOSPITAL_COMMUNITY): Payer: Self-pay | Admitting: Pharmacy Technician

## 2014-01-23 ENCOUNTER — Ambulatory Visit: Payer: Self-pay | Admitting: Orthopedic Surgery

## 2014-01-23 ENCOUNTER — Telehealth (HOSPITAL_COMMUNITY): Payer: Self-pay

## 2014-01-23 NOTE — Telephone Encounter (Addendum)
Called patient this morning and informed her to stop her Xarelto 5 days prior to surgery.  Patient confirms understanding.   Message copied by Darlyn Chamber on Tue Jan 23, 2014  9:13 AM ------      Message from: Johnathan Hausen B      Created: Wed Jan 17, 2014 12:06 PM      Regarding: RE: Xarelto       I think that we should stop this 5 days before her surgery.      Thanks            ----- Message -----         From: Charolotte Capuchin, RN         Sent: 01/16/2014  10:26 AM           To: Kaylyn Lim, MD, Sherion Jefm Bryant      Subject: Xarelto                                                  Dr. Newman Nip Tafoya is scheduled for Lap Band on 01/30/2014, she is currently taking Xarelto for history of DVT and PE, I typically tell patients to stop their blood thinners 7-10 days prior to surgery depending on the medication but did not want to move forward with this without knowing your recommendations/plan.  Please advise as soon as possible. She informed me last night that her pre-op visit with you is the Thursday or Friday before surgery and if you wanted her to stop the Xarelto I didn't think that would be enough time.              Thanks,             Margarita Grizzle       ------

## 2014-01-23 NOTE — Patient Instructions (Addendum)
[name]  01/23/2014   Your procedure is scheduled on:  01/30/2014.    Report to Old Emergency Room Entrance on [facility].  This will be marked with a RED SIGN .   Report to SHORT STAY  which is at that entrance at 0515am.    Call this number if you have problems the morning of surgery: [phone]   Remember:   Do not eat food or drink liquids after midnight.   Take these medicines the morning of surgery with A SIP OF WATER: Azopt eye drops, Symbicort Inhaler, Xopenex if needed, Nexium, Nasonex if needed, Pataday if needed             Bring eye drops and Inhalers with you to hospital.     Do not wear jewelry, make-up or nail polish.  Do not wear lotions, powders, or perfumes.  deodorant.  Do not shave 48 hours prior to surgery.   Do not bring valuables to the hospital.  Contacts, dentures or bridgework may not be worn into surgery.  Leave suitcase in the car. After surgery it may be brought to your room.  For patients admitted to the hospital, checkout time is 11:00 AM the day of  discharge.       Please read over the following fact sheets that you were given: [facility] - Preparing for Surgery Before surgery, you can play an important role.  Because skin is not sterile, your skin needs to be as free of germs as possible.  You can reduce the number of germs on your skin by washing with CHG (chlorahexidine gluconate) soap before surgery.  CHG is an antiseptic cleaner which kills germs and bonds with the skin to continue killing germs even after washing. Please DO NOT use if you have an allergy to CHG or antibacterial soaps.  If your skin becomes reddened/irritated stop using the CHG and inform your nurse when you arrive at Short Stay. Do not shave (including legs and underarms) for at least 48 hours prior to the first CHG shower.  You may shave your face/neck. Please follow these instructions carefully:  1.  Shower with CHG Soap the night before surgery and the  morning of  Surgery.  2.  If you choose to wash your hair, wash your hair first as usual with your  normal  shampoo.  3.  After you shampoo, rinse your hair and body thoroughly to remove the  shampoo.                           4.  Use CHG as you would any other liquid soap.  You can apply chg directly  to the skin and wash                       Gently with a scrungie or clean washcloth.  5.  Apply the CHG Soap to your body ONLY FROM THE NECK DOWN.   Do not use on face/ open                           Wound or open sores. Avoid contact with eyes, ears mouth and genitals (private parts).                       Wash face,  Genitals (private parts) with your normal soap.  6.  Wash thoroughly, paying special attention to the area where your surgery  will be performed.  7.  Thoroughly rinse your body with warm water from the neck down.  8.  DO NOT shower/wash with your normal soap after using and rinsing off  the CHG Soap.                9.  Pat yourself dry with a clean towel.            10.  Wear clean pajamas.            11.  Place clean sheets on your bed the night of your first shower and do not  sleep with pets. Day of Surgery : Do not apply any lotions/deodorants the morning of surgery.  Please wear clean clothes to the hospital/surgery center.  FAILURE TO FOLLOW THESE INSTRUCTIONS MAY RESULT IN THE CANCELLATION OF YOUR SURGERY PATIENT SIGNATURE_________________________________  NURSE SIGNATURE__________________________________  ________________________________________________________________________ , coughing and deep breathing exercises, leg exercises

## 2014-01-23 NOTE — Psychosocial Assessment (Signed)
Need orders in EPIC.  Surgery on 01/30/2014.  Proep on 01/24/2014 at 130pm.  Thank You.

## 2014-01-24 ENCOUNTER — Ambulatory Visit (HOSPITAL_COMMUNITY)
Admission: RE | Admit: 2014-01-24 | Discharge: 2014-01-24 | Disposition: A | Discharge status: Home / Self Care | Payer: BC Managed Care – PPO | Source: Ambulatory Visit | Attending: Surgery | Admitting: Surgery

## 2014-01-24 ENCOUNTER — Encounter (HOSPITAL_COMMUNITY)
Admission: RE | Admit: 2014-01-24 | Discharge: 2014-01-24 | Disposition: A | Discharge status: Home / Self Care | Payer: BC Managed Care – PPO | Source: Ambulatory Visit | Attending: Surgery | Admitting: Surgery

## 2014-01-24 ENCOUNTER — Encounter (HOSPITAL_COMMUNITY): Payer: Self-pay

## 2014-01-24 DIAGNOSIS — Z86711 Personal history of pulmonary embolism: Secondary | ICD-10-CM | POA: Insufficient documentation

## 2014-01-24 DIAGNOSIS — Z86718 Personal history of other venous thrombosis and embolism: Secondary | ICD-10-CM | POA: Insufficient documentation

## 2014-01-24 DIAGNOSIS — I10 Essential (primary) hypertension: Secondary | ICD-10-CM | POA: Insufficient documentation

## 2014-01-24 DIAGNOSIS — Z01818 Encounter for other preprocedural examination: Secondary | ICD-10-CM | POA: Diagnosis not present

## 2014-01-24 LAB — BASIC METABOLIC PANEL
Anion gap: 12 (ref 5–15)
BUN: 34 mg/dL — ABNORMAL HIGH (ref 6–23)
CO2: 24 mEq/L (ref 19–32)
Calcium: 9.5 mg/dL (ref 8.4–10.5)
Chloride: 102 mEq/L (ref 96–112)
Creatinine, Ser: 1.16 mg/dL — ABNORMAL HIGH (ref 0.50–1.10)
GFR calc Af Amer: 61 mL/min — ABNORMAL LOW (ref >90)
GFR calc non Af Amer: 52 mL/min — ABNORMAL LOW (ref >90)
Glucose, Bld: 71 mg/dL (ref 70–99)
Potassium: 4.2 mEq/L (ref 3.7–5.3)
Sodium: 138 mEq/L (ref 137–147)

## 2014-01-24 LAB — CBC
HCT: 40.4 % (ref 36.0–46.0)
Hemoglobin: 13.8 g/dL (ref 12.0–15.0)
MCH: 27.3 pg (ref 26.0–34.0)
MCHC: 34.2 g/dL (ref 30.0–36.0)
MCV: 80 fL (ref 78.0–100.0)
Platelets: 140 10*3/uL — ABNORMAL LOW (ref 150–400)
RBC: 5.05 MIL/uL (ref 3.87–5.11)
RDW: 14.7 % (ref 11.5–15.5)
WBC: 7.8 10*3/uL (ref 4.0–10.5)

## 2014-01-25 ENCOUNTER — Other Ambulatory Visit (INDEPENDENT_AMBULATORY_CARE_PROVIDER_SITE_OTHER): Payer: Self-pay | Admitting: Surgery

## 2014-01-25 NOTE — H&P (Signed)
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 She is lost T282 but has been unable to lose any further weight. She had a left knee replacement last year and will likely need another right knee replacement by Dr. Erasmo Score. Along the way she had DVT and pulmonary embolism and had a temporary filter placed for her for surgery due which was removed. She subsequently had a cervical disc operation and had a permanent Greenfield filter placed at that time. Her workup did not reveal any genetic predisposition towards DVT.  She's been our seminars and is very anxious and having a band of her bypass to try to help her lose more weight down into the low 200s. This hopefully will help her significant arthritis. Likely help her knee replacements be more successful.  Past Medical History   Diagnosis  Date   .  Hypertension    .  Arthritis    .  Asthma    .  Morbid obesity    .  PE (pulmonary embolism)    .  DVT (deep venous thrombosis)    .  GERD (gastroesophageal reflux disease)    .  Dysfunctional uterine bleeding    .  Headache(784.0)      MIGRAINES IN PAST   .  Sleep apnea, obstructive      USES C-PAP   .  Glaucoma     Past Surgical History   Procedure  Laterality  Date   .  Posterior laminectomy / decompression cervical spine   01/26/2011   .  Glaucoma surgery   10/31/2009   .  Gastric bypass   [id number]   .  Knee arthroscopy   12/2007   .  Dilation and curettage of uterus     .  Hysteroscopy       X2   .  Eye surgery   11/16/2009     FOR GLAUCOMA AND LASER SURGERY X2   .  Cataract surg     .  Ivc filter       2010 - THEN REMOVED THEN PLACED AGAIN 2012   .  Total knee arthroplasty   09/09/2011     Procedure: TOTAL KNEE ARTHROPLASTY;  Surgeon: Johnn Hai, MD; Location: [facility]; Service: Orthopedics; Laterality: Left;   .  Capsulotomy   05/03/2012     Procedure: MINOR CAPSULOTOMY; Surgeon: Myrtha Mantis., MD; Location: [place]; Service: Ophthalmology; Laterality: Right;   .  Yag laser application   5/68/1275     Procedure: YAG LASER APPLICATION; Surgeon: Myrtha Mantis., MD; Location: [place]; Service: Ophthalmology; Laterality: Right;   .  Capsulotomy   05/05/2012     Procedure: MINOR CAPSULOTOMY; Surgeon: Myrtha Mantis., MD; Location: [place]; Service: Ophthalmology; Laterality: Right;   .  Yag laser application   1/70/0174     Procedure: YAG LASER APPLICATION; Surgeon: Myrtha Mantis., MD; Location: [place]; Service: Ophthalmology; Laterality: Right;   .  Abdominal hysterectomy      Current Outpatient Prescriptions   Medication  Sig  Dispense  Refill   .  ALPRAZolam (XANAX) 0.25 MG tablet  Take 0.25 mg by mouth at bedtime as needed. sleep     .  b complex vitamins tablet  Take 1 tablet by mouth daily.     .  bimatoprost (LUMIGAN) 0.01 % SOLN  Place 1 drop into both eyes at bedtime.     .  brinzolamide (AZOPT) 1 % ophthalmic suspension  Place 1 drop into both eyes 3 (three) times daily.     .  Calcium Carbonate-Vit D-Min (CALTRATE 600+D PLUS MINERALS PO)  Take 2 tablets by mouth 2 (two) times daily.     .  celecoxib (CELEBREX) 200 MG capsule  Take 200 mg by mouth 2 (two) times daily.     .  ciclesonide (ALVESCO) 160 MCG/ACT inhaler  Inhale 1 puff into the lungs 2 (two) times daily.     .  Cyanocobalamin (VITAMIN B-12) 2500 MCG SUBL  Place 2 tablets under the tongue at bedtime.     .  cyclobenzaprine (FLEXERIL) 10 MG tablet  Take 10 mg by mouth at bedtime as needed. FOR MUSCLE SPASMS     .  diclofenac sodium (VOLTAREN) 1 % GEL  Apply 1 application topically 4 (four) times daily as needed. For joint pain     .  diltiazem (DILACOR XR) 240 MG 24 hr capsule  Take 240 mg by mouth at bedtime.     Marland Kitchen   esomeprazole (NEXIUM) 40 MG capsule  Take 40 mg by mouth daily before breakfast.     .  fexofenadine (ALLEGRA) 180 MG tablet  Take 180 mg by mouth every morning.     .  fish oil-omega-3 fatty acids 1000 MG capsule  Take 1 g by mouth 2 (two) times daily.     Marland Kitchen  levalbuterol (XOPENEX) 1.25 MG/3ML nebulizer solution  Take 1.25 mg by nebulization every 6 (six) hours as needed. For shortness of breath/wheezing     .  loratadine-pseudoephedrine (ALAVERT ALLERGY/SINUS) 5-120 MG per tablet  Take 1 tablet by mouth 2 (two) times daily. For sinus drainage     .  magnesium chloride (SLOW-MAG) 64 MG TBEC  Take 1-2 tablets by mouth 2 (two) times daily. Patient takes 2 tablet every morning and 1 tablet with dinner     .  mometasone (NASONEX) 50 MCG/ACT nasal spray  Place 2 sprays into the nose daily.     .  montelukast (SINGULAIR) 10 MG tablet  Take 10 mg by mouth at bedtime.     .  Multiple Vitamin (MULTIVITAMIN) tablet  Take 2 tablets by mouth at bedtime.     .  niacin 500 MG tablet  Take 500 mg by mouth daily with breakfast.     .  Olopatadine HCl (PATADAY) 0.2 % SOLN  Apply 1 drop to eye 2 (two) times daily as needed.     Marland Kitchen  oxyCODONE (OXYCONTIN) 10 MG 12 hr tablet  Take 10 mg by mouth every 12 (twelve) hours as needed. For pain     .  potassium chloride SA (K-DUR,KLOR-CON) 20 MEQ tablet  Take 20-40 mEq by mouth 2 (two) times daily. 40 meq with breakfast and 20 meq with dinner     .  Propylene Glycol (SYSTANE BALANCE) 0.6 % SOLN  Place 1 drop into both eyes 3 (three) times daily. In right eye     .  protein supplement (RESOURCE BENEPROTEIN) POWD  Take 6 g by mouth 3 (three) times daily with meals.     .  quiNINE (QUALAQUIN) 324 MG capsule  Take 324 mg by mouth at bedtime.     Marland Kitchen  QVAR 80  MCG/ACT inhaler      .  Rivaroxaban (XARELTO) 20 MG TABS  Take 20 mg by mouth daily.     .  valsartan-hydrochlorothiazide (DIOVAN-HCT) 80-12.5 MG per tablet  Take 1 tablet by mouth daily.     .  Vitamin D, Ergocalciferol,  (DRISDOL) 50000 UNITS CAPS capsule      .  iron polysaccharides (NIFEREX) 150 MG capsule  Take 1 capsule (150 mg total) by mouth 2 times daily at 12 noon and 4 pm.  60 capsule  1    No current facility-administered medications for this visit.   Beta adrenergic blockers; Penicillins; Sulfa antibiotics; and Thimerosal  Family History   Problem  Relation  Age of Onset   .  Glaucoma     .  Cerebral aneurysm     .  Stroke     .  Arthritis  Mother    .  Hypertension  Mother    .  Diabetes  Mother    .  Arthritis  Father    .  Diabetes  Father    .  Alcohol abuse  Father    Social History: reports that she has never smoked. She has never used smokeless tobacco. She reports that she does not drink alcohol or use illicit drugs.  REVIEW OF SYSTEMS - PERTINENT POSITIVES ONLY:  Positive for history of DVT and some asthma. Positive for arthritis. All of systems negative.  Physical Exam:  Blood pressure 140/90, pulse 104, resp. rate 16, height 5' 4.75" (1.645 m), weight 282 lb (127.914 kg).  Body mass index is 47.27 kg/(m^2).  Gen: WDWN African American female NAD  Neurological: Alert and oriented to person, place, and time. Motor and sensory function is grossly intact  Head: Normocephalic and atraumatic.  Eyes: Conjunctivae are normal. Pupils are equal, round, and reactive to light. No scleral icterus.  Neck: Normal range of motion. Neck supple. No tracheal deviation or thyromegaly present.  Cardiovascular: SR without murmurs or gallops. No carotid bruits  Respiratory: Effort normal. No respiratory distress. No chest wall tenderness. Breath sounds normal. No wheezes, rales or rhonchi.  Abdomen: Obese nontender  GU:  Musculoskeletal: Normal range of motion. Extremities are nontender. No cyanosis, edema or clubbing noted Lymphadenopathy: No cervical, preauricular, postauricular or axillary adenopathy is present Skin: Skin is warm and dry. No rash noted. No diaphoresis. No erythema. No pallor.  Pscyh: Normal mood and affect. Behavior is normal. Judgment and thought content normal.  LABORATORY RESULTS:  No results found for this or any previous visit (from the past 48 hour(s)).  RADIOLOGY RESULTS:  No results found.  Problem List:  Patient Active Problem List    Diagnosis  Date Noted   .  Anemia  09/10/2011   .  ARF (acute renal failure)  09/10/2011   .  Paresthesias  09/10/2011   .  Total knee replacement status, left  09/10/2011   .  Cervical spondylosis with myelopathy  06/30/2011   .  Osteoarthritis of both knees  06/30/2011   .  Diabetes mellitus    .  Arthritis    .  Sleep apnea, obstructive    .  Morbid obesity    .  Dysfunctional uterine bleeding    .  HEPATITIS C  01/25/2008   .  HYPERTENSION  01/25/2008   .  C V A / STROKE  01/25/2008   .  ASTHMA  01/25/2008   .  SLEEP APNEA  01/25/2008   .  DYSPNEA  01/25/2008   Assessment & Plan:  Prior gastric bypass done 4 years ago with weight plateau and despite many attempts to lose more weight with her gastric bypass is now frustrated and wants to have a band of her bypass. Will likely get an upper GI series to assess the size of her pouch and we'll try to move toward band over bypass to further restrict her and help her lose more weight.  Matt B. Hassell Done, MD, [facility]  [phone] beeper  [phone]

## 2014-01-25 NOTE — Progress Notes (Signed)
Patient called back with correct dosage of Alphagan eye drops.  Entered into EPIC.  Also made sure patient aware to bring CPAP mask and tubing day of surgery.  Patient verbalized understanding.

## 2014-01-30 ENCOUNTER — Inpatient Hospital Stay (HOSPITAL_COMMUNITY): Payer: BC Managed Care – PPO | Admitting: Anesthesiology

## 2014-01-30 ENCOUNTER — Encounter (HOSPITAL_COMMUNITY): Payer: Self-pay | Admitting: *Deleted

## 2014-01-30 ENCOUNTER — Encounter (HOSPITAL_COMMUNITY)
Admission: RE | Disposition: A | Discharge status: Home / Self Care | Payer: Self-pay | Source: Ambulatory Visit | Attending: Surgery

## 2014-01-30 ENCOUNTER — Inpatient Hospital Stay (HOSPITAL_COMMUNITY)
Admission: RE | Admit: 2014-01-30 | Discharge: 2014-02-01 | DRG: 621 | Disposition: A | Discharge status: Home / Self Care | Payer: BC Managed Care – PPO | Source: Ambulatory Visit | Attending: Surgery | Admitting: Surgery

## 2014-01-30 ENCOUNTER — Encounter (HOSPITAL_COMMUNITY): Payer: BC Managed Care – PPO | Admitting: Anesthesiology

## 2014-01-30 DIAGNOSIS — Z9884 Bariatric surgery status: Secondary | ICD-10-CM | POA: Diagnosis not present

## 2014-01-30 DIAGNOSIS — Z6841 Body Mass Index (BMI) 40.0 and over, adult: Secondary | ICD-10-CM | POA: Diagnosis not present

## 2014-01-30 DIAGNOSIS — I10 Essential (primary) hypertension: Secondary | ICD-10-CM | POA: Diagnosis present

## 2014-01-30 DIAGNOSIS — M179 Osteoarthritis of knee, unspecified: Secondary | ICD-10-CM | POA: Diagnosis present

## 2014-01-30 DIAGNOSIS — Z96652 Presence of left artificial knee joint: Secondary | ICD-10-CM | POA: Diagnosis present

## 2014-01-30 DIAGNOSIS — J45909 Unspecified asthma, uncomplicated: Secondary | ICD-10-CM | POA: Diagnosis present

## 2014-01-30 DIAGNOSIS — Z86711 Personal history of pulmonary embolism: Secondary | ICD-10-CM

## 2014-01-30 DIAGNOSIS — K219 Gastro-esophageal reflux disease without esophagitis: Secondary | ICD-10-CM | POA: Diagnosis present

## 2014-01-30 DIAGNOSIS — G4733 Obstructive sleep apnea (adult) (pediatric): Secondary | ICD-10-CM | POA: Diagnosis present

## 2014-01-30 DIAGNOSIS — K449 Diaphragmatic hernia without obstruction or gangrene: Secondary | ICD-10-CM | POA: Diagnosis present

## 2014-01-30 HISTORY — PX: HIATAL HERNIA REPAIR: SHX195

## 2014-01-30 HISTORY — PX: UPPER GI ENDOSCOPY: SHX6162

## 2014-01-30 HISTORY — PX: LAPAROSCOPIC GASTRIC BANDING: SHX1100

## 2014-01-30 LAB — CBC
HCT: 37.3 % (ref 36.0–46.0)
Hemoglobin: 12.8 g/dL (ref 12.0–15.0)
MCH: 27.1 pg (ref 26.0–34.0)
MCHC: 34.3 g/dL (ref 30.0–36.0)
MCV: 79 fL (ref 78.0–100.0)
Platelets: 128 10*3/uL — ABNORMAL LOW (ref 150–400)
RBC: 4.72 MIL/uL (ref 3.87–5.11)
RDW: 14.6 % (ref 11.5–15.5)
WBC: 10 10*3/uL (ref 4.0–10.5)

## 2014-01-30 LAB — CREATININE, SERUM
Creatinine, Ser: 1.15 mg/dL — ABNORMAL HIGH (ref 0.50–1.10)
GFR calc Af Amer: 61 mL/min — ABNORMAL LOW (ref >90)
GFR calc non Af Amer: 53 mL/min — ABNORMAL LOW (ref >90)

## 2014-01-30 LAB — PREGNANCY, URINE: Preg Test, Ur: NEGATIVE

## 2014-01-30 SURGERY — GASTRIC BANDING, LAPAROSCOPIC
Anesthesia: General | Site: Abdomen

## 2014-01-30 MED ORDER — PROPOFOL 10 MG/ML IV BOLUS
INTRAVENOUS | Status: DC | PRN
  Administered 2014-01-30: 200 mg via INTRAVENOUS

## 2014-01-30 MED ORDER — MIDAZOLAM HCL 5 MG/5ML IJ SOLN
INTRAMUSCULAR | Status: DC | PRN
  Administered 2014-01-30: 2 mg via INTRAVENOUS

## 2014-01-30 MED ORDER — UNJURY CHICKEN SOUP POWDER: 2.0000 [oz_av] | Freq: Four times a day (QID) | ORAL | Status: DC

## 2014-01-30 MED ORDER — ONDANSETRON HCL 4 MG/2ML IJ SOLN
INTRAMUSCULAR | Status: AC
  Filled 2014-01-30: qty 2

## 2014-01-30 MED ORDER — LIDOCAINE HCL (CARDIAC) 20 MG/ML IV SOLN
INTRAVENOUS | Status: DC | PRN
  Administered 2014-01-30: 50 mg via INTRAVENOUS

## 2014-01-30 MED ORDER — OXYCODONE HCL 5 MG/5ML PO SOLN
5.0000 mg | ORAL | Status: DC | PRN
  Administered 2014-01-31 (×3): 10 mg via ORAL
  Filled 2014-01-30 (×3): qty 10

## 2014-01-30 MED ORDER — KCL IN DEXTROSE-NACL 20-5-0.45 MEQ/L-%-% IV SOLN
INTRAVENOUS | Status: AC
  Administered 2014-01-30: 1000 mL
  Filled 2014-01-30: qty 1000

## 2014-01-30 MED ORDER — ACETAMINOPHEN 160 MG/5ML PO SOLN: 325.0000 mg | ORAL | Status: DC | PRN

## 2014-01-30 MED ORDER — HYDROMORPHONE HCL 1 MG/ML IJ SOLN
0.2500 mg | INTRAMUSCULAR | Status: DC | PRN
  Administered 2014-01-30 (×2): 0.5 mg via INTRAVENOUS

## 2014-01-30 MED ORDER — PROPOFOL 10 MG/ML IV BOLUS
INTRAVENOUS | Status: AC
  Filled 2014-01-30: qty 20

## 2014-01-30 MED ORDER — GLYCOPYRROLATE 0.2 MG/ML IJ SOLN
INTRAMUSCULAR | Status: AC
  Filled 2014-01-30: qty 3

## 2014-01-30 MED ORDER — LACTATED RINGERS IV SOLN
INTRAVENOUS | Status: DC | PRN
  Administered 2014-01-30 (×2): via INTRAVENOUS

## 2014-01-30 MED ORDER — FENTANYL CITRATE 0.05 MG/ML IJ SOLN
INTRAMUSCULAR | Status: DC | PRN
  Administered 2014-01-30 (×2): 50 ug via INTRAVENOUS
  Administered 2014-01-30: 100 ug via INTRAVENOUS
  Administered 2014-01-30: 50 ug via INTRAVENOUS

## 2014-01-30 MED ORDER — UNJURY CHOCOLATE CLASSIC POWDER
2.0000 [oz_av] | Freq: Four times a day (QID) | ORAL | Status: DC
  Administered 2014-01-31 (×2): 2 [oz_av] via ORAL

## 2014-01-30 MED ORDER — BUDESONIDE-FORMOTEROL FUMARATE 160-4.5 MCG/ACT IN AERO
2.0000 | INHALATION_SPRAY | Freq: Two times a day (BID) | RESPIRATORY_TRACT | Status: DC
  Filled 2014-01-30: qty 6

## 2014-01-30 MED ORDER — CETYLPYRIDINIUM CHLORIDE 0.05 % MT LIQD
7.0000 mL | Freq: Two times a day (BID) | OROMUCOSAL | Status: DC
  Administered 2014-01-30 – 2014-01-31 (×3): 7 mL via OROMUCOSAL

## 2014-01-30 MED ORDER — FENTANYL CITRATE 0.05 MG/ML IJ SOLN
INTRAMUSCULAR | Status: AC
  Filled 2014-01-30: qty 5

## 2014-01-30 MED ORDER — NEOSTIGMINE METHYLSULFATE 10 MG/10ML IV SOLN
INTRAVENOUS | Status: DC | PRN
  Administered 2014-01-30: 4 mg via INTRAVENOUS

## 2014-01-30 MED ORDER — EPHEDRINE SULFATE 50 MG/ML IJ SOLN
INTRAMUSCULAR | Status: AC
  Filled 2014-01-30: qty 1

## 2014-01-30 MED ORDER — PHENYLEPHRINE 40 MCG/ML (10ML) SYRINGE FOR IV PUSH (FOR BLOOD PRESSURE SUPPORT)
PREFILLED_SYRINGE | INTRAVENOUS | Status: AC
  Filled 2014-01-30: qty 10

## 2014-01-30 MED ORDER — SODIUM CHLORIDE 0.9 % IJ SOLN
INTRAMUSCULAR | Status: DC | PRN
  Administered 2014-01-30: 20 mL

## 2014-01-30 MED ORDER — ROCURONIUM BROMIDE 100 MG/10ML IV SOLN
INTRAVENOUS | Status: DC | PRN
  Administered 2014-01-30 (×2): 10 mg via INTRAVENOUS
  Administered 2014-01-30: 60 mg via INTRAVENOUS
  Administered 2014-01-30: 10 mg via INTRAVENOUS

## 2014-01-30 MED ORDER — HEPARIN SODIUM (PORCINE) 5000 UNIT/ML IJ SOLN
5000.0000 [IU] | INTRAMUSCULAR | Status: AC
  Administered 2014-01-30: 5000 [IU] via SUBCUTANEOUS
  Filled 2014-01-30: qty 1

## 2014-01-30 MED ORDER — ACETAMINOPHEN 160 MG/5ML PO SOLN: 650.0000 mg | ORAL | Status: DC | PRN

## 2014-01-30 MED ORDER — EPHEDRINE SULFATE 50 MG/ML IJ SOLN
INTRAMUSCULAR | Status: DC | PRN
  Administered 2014-01-30: 10 mg via INTRAVENOUS
  Administered 2014-01-30: 5 mg via INTRAVENOUS
  Administered 2014-01-30: 10 mg via INTRAVENOUS

## 2014-01-30 MED ORDER — KCL IN DEXTROSE-NACL 20-5-0.45 MEQ/L-%-% IV SOLN
INTRAVENOUS | Status: DC
  Administered 2014-01-30 – 2014-02-01 (×4): via INTRAVENOUS
  Filled 2014-01-30 (×6): qty 1000

## 2014-01-30 MED ORDER — ROCURONIUM BROMIDE 100 MG/10ML IV SOLN
INTRAVENOUS | Status: AC
  Filled 2014-01-30: qty 1

## 2014-01-30 MED ORDER — UNJURY VANILLA POWDER
2.0000 [oz_av] | Freq: Four times a day (QID) | ORAL | Status: DC
  Administered 2014-01-31: 2 [oz_av] via ORAL

## 2014-01-30 MED ORDER — PHENYLEPHRINE HCL 10 MG/ML IJ SOLN
INTRAMUSCULAR | Status: DC | PRN
  Administered 2014-01-30 (×3): 80 ug via INTRAVENOUS

## 2014-01-30 MED ORDER — PROMETHAZINE HCL 25 MG/ML IJ SOLN: 6.2500 mg | INTRAMUSCULAR | Status: DC | PRN

## 2014-01-30 MED ORDER — DEXAMETHASONE SODIUM PHOSPHATE 10 MG/ML IJ SOLN
INTRAMUSCULAR | Status: AC
  Filled 2014-01-30: qty 1

## 2014-01-30 MED ORDER — BUPIVACAINE LIPOSOME 1.3 % IJ SUSP
20.0000 mL | Freq: Once | INTRAMUSCULAR | Status: AC
  Administered 2014-01-30: 20 mL
  Filled 2014-01-30: qty 20

## 2014-01-30 MED ORDER — HEPARIN SODIUM (PORCINE) 5000 UNIT/ML IJ SOLN
5000.0000 [IU] | Freq: Three times a day (TID) | INTRAMUSCULAR | Status: DC
  Administered 2014-01-30 – 2014-02-01 (×5): 5000 [IU] via SUBCUTANEOUS
  Filled 2014-01-30 (×8): qty 1

## 2014-01-30 MED ORDER — CHLORHEXIDINE GLUCONATE CLOTH 2 % EX PADS: 6.0000 | MEDICATED_PAD | Freq: Once | CUTANEOUS | Status: DC

## 2014-01-30 MED ORDER — HYDROMORPHONE HCL 1 MG/ML IJ SOLN
INTRAMUSCULAR | Status: AC
  Filled 2014-01-30: qty 1

## 2014-01-30 MED ORDER — SODIUM CHLORIDE 0.9 % IJ SOLN
INTRAMUSCULAR | Status: AC
  Filled 2014-01-30: qty 20

## 2014-01-30 MED ORDER — LIDOCAINE HCL (CARDIAC) 20 MG/ML IV SOLN
INTRAVENOUS | Status: AC
  Filled 2014-01-30: qty 5

## 2014-01-30 MED ORDER — MORPHINE SULFATE 2 MG/ML IJ SOLN
2.0000 mg | INTRAMUSCULAR | Status: DC | PRN
  Administered 2014-01-30 – 2014-01-31 (×4): 2 mg via INTRAVENOUS
  Filled 2014-01-30 (×4): qty 1

## 2014-01-30 MED ORDER — DEXAMETHASONE SODIUM PHOSPHATE 10 MG/ML IJ SOLN
INTRAMUSCULAR | Status: DC | PRN
  Administered 2014-01-30: 10 mg via INTRAVENOUS

## 2014-01-30 MED ORDER — LEVOFLOXACIN IN D5W 750 MG/150ML IV SOLN
750.0000 mg | INTRAVENOUS | Status: AC
Start: 2014-01-30 — End: 2014-01-30
  Administered 2014-01-30: 750 mg via INTRAVENOUS
  Filled 2014-01-30: qty 150

## 2014-01-30 MED ORDER — NEOSTIGMINE METHYLSULFATE 10 MG/10ML IV SOLN
INTRAVENOUS | Status: AC
  Filled 2014-01-30: qty 1

## 2014-01-30 MED ORDER — GLYCOPYRROLATE 0.2 MG/ML IJ SOLN
INTRAMUSCULAR | Status: DC | PRN
  Administered 2014-01-30: 0.6 mg via INTRAVENOUS

## 2014-01-30 MED ORDER — LACTATED RINGERS IV SOLN
INTRAVENOUS | Status: DC | PRN
  Administered 2014-01-30: 1000 mL

## 2014-01-30 MED ORDER — CHLORHEXIDINE GLUCONATE 0.12 % MT SOLN
15.0000 mL | Freq: Two times a day (BID) | OROMUCOSAL | Status: DC
  Administered 2014-01-30 – 2014-01-31 (×2): 15 mL via OROMUCOSAL
  Filled 2014-01-30 (×6): qty 15

## 2014-01-30 MED ORDER — ONDANSETRON HCL 4 MG/2ML IJ SOLN
INTRAMUSCULAR | Status: DC | PRN
Start: 2014-01-30 — End: 2014-01-30
  Administered 2014-01-30: 4 mg via INTRAVENOUS

## 2014-01-30 MED ORDER — MIDAZOLAM HCL 2 MG/2ML IJ SOLN
INTRAMUSCULAR | Status: AC
  Filled 2014-01-30: qty 2

## 2014-01-30 MED ORDER — ONDANSETRON HCL 4 MG/2ML IJ SOLN: 4.0000 mg | INTRAMUSCULAR | Status: DC | PRN

## 2014-01-30 MED ORDER — SODIUM CHLORIDE 0.9 % IJ SOLN
INTRAMUSCULAR | Status: AC
  Filled 2014-01-30: qty 10

## 2014-01-30 SURGICAL SUPPLY — 61 items
ADH SKN CLS APL DERMABOND .7 (GAUZE/BANDAGES/DRESSINGS) ×4
APL SKNCLS STERI-STRIP NONHPOA (GAUZE/BANDAGES/DRESSINGS)
BAND LAP VG SYSTEM W/TUBES (Band) IMPLANT
BENZOIN TINCTURE PRP APPL 2/3 (GAUZE/BANDAGES/DRESSINGS) IMPLANT
BLADE SURG 15 STRL LF DISP TIS (BLADE) ×2 IMPLANT
BLADE SURG 15 STRL SS (BLADE) ×3
CANISTER SUCTION 1500CC (MISCELLANEOUS) ×1 IMPLANT
DECANTER SPIKE VIAL GLASS SM (MISCELLANEOUS) ×6 IMPLANT
DERMABOND ADVANCED (GAUZE/BANDAGES/DRESSINGS) ×2
DERMABOND ADVANCED .7 DNX12 (GAUZE/BANDAGES/DRESSINGS) ×2 IMPLANT
DEVICE SUT QUICK LOAD TK 5 (STAPLE) ×10 IMPLANT
DEVICE SUT TI-KNOT TK 5X26 (MISCELLANEOUS) ×3 IMPLANT
DEVICE SUTURE ENDOST 10MM (ENDOMECHANICALS) ×1 IMPLANT
DISSECTOR BLUNT TIP ENDO 5MM (MISCELLANEOUS) ×1 IMPLANT
DRAPE CAMERA CLOSED 9X96 (DRAPES) ×3 IMPLANT
DRAPE LG THREE QUARTER DISP (DRAPES) ×2 IMPLANT
DRAPE ORTHO 2.5IN SPLIT 77X108 (DRAPES) IMPLANT
DRAPE ORTHO SPLIT 77X108 STRL (DRAPES) ×3
DRAPE UNIVERSAL PACK (DRAPES) ×3 IMPLANT
ELECT REM PT RETURN 9FT ADLT (ELECTROSURGICAL) ×3
ELECTRODE REM PT RTRN 9FT ADLT (ELECTROSURGICAL) ×2 IMPLANT
GAUZE SPONGE 4X4 12PLY STRL (GAUZE/BANDAGES/DRESSINGS) ×3 IMPLANT
GLOVE BIOGEL M 8.0 STRL (GLOVE) ×3 IMPLANT
GOWN SPEC L4 XLG W/TWL (GOWN DISPOSABLE) ×3 IMPLANT
GOWN STRL REUS W/TWL XL LVL3 (GOWN DISPOSABLE) ×9 IMPLANT
HOVERMATT SINGLE USE (MISCELLANEOUS) ×3 IMPLANT
KIT BASIN OR (CUSTOM PROCEDURE TRAY) ×3 IMPLANT
LAP BAND VG SYSTEM W/TUBES (Band) ×3 IMPLANT
MESH HERNIA 1X4 RECT BARD (Mesh General) ×2 IMPLANT
MESH HERNIA BARD 1X4 (Mesh General) ×1 IMPLANT
NDL SPNL 22GX3.5 QUINCKE BK (NEEDLE) ×2 IMPLANT
NEEDLE SPNL 22GX3.5 QUINCKE BK (NEEDLE) ×3 IMPLANT
SCISSORS LAP 5X45 EPIX DISP (ENDOMECHANICALS) ×1 IMPLANT
SET BERKELEY SUCTION TUBING (SUCTIONS) ×1 IMPLANT
SET IRRIG TUBING LAPAROSCOPIC (IRRIGATION / IRRIGATOR) ×1 IMPLANT
SHEARS CURVED HARMONIC AC 45CM (MISCELLANEOUS) ×1 IMPLANT
SLEEVE ADV FIXATION 5X100MM (TROCAR) ×3 IMPLANT
SOLUTION ANTI FOG 6CC (MISCELLANEOUS) ×3 IMPLANT
SPONGE LAP 18X18 X RAY DECT (DISPOSABLE) ×3 IMPLANT
STAPLER VISISTAT 35W (STAPLE) ×3 IMPLANT
STRIP CLOSURE SKIN 1/2X4 (GAUZE/BANDAGES/DRESSINGS) IMPLANT
SUT ETHIBOND 2 0 SH (SUTURE) ×9
SUT ETHIBOND 2 0 SH 36X2 (SUTURE) ×6 IMPLANT
SUT PROLENE 2 0 CT2 30 (SUTURE) ×3 IMPLANT
SUT SILK 0 (SUTURE) ×3
SUT SILK 0 30XBRD TIE 6 (SUTURE) ×2 IMPLANT
SUT SURGIDAC NAB ES-9 0 48 120 (SUTURE) ×1 IMPLANT
SUT VIC AB 2-0 SH 27 (SUTURE)
SUT VIC AB 2-0 SH 27X BRD (SUTURE) IMPLANT
SUT VIC AB 4-0 SH 18 (SUTURE) ×3 IMPLANT
SYR 20CC LL (SYRINGE) ×6 IMPLANT
TOWEL OR 17X26 10 PK STRL BLUE (TOWEL DISPOSABLE) ×5 IMPLANT
TOWEL OR NON WOVEN STRL DISP B (DISPOSABLE) ×3 IMPLANT
TROCAR ADV FIXATION 12X100MM (TROCAR) ×3 IMPLANT
TROCAR BLADELESS 15MM (ENDOMECHANICALS) ×3 IMPLANT
TROCAR BLADELESS OPT 5 100 (ENDOMECHANICALS) ×3 IMPLANT
TROCAR XCEL NON-BLD 11X100MML (ENDOMECHANICALS) IMPLANT
TROCAR XCEL UNIV SLVE 11M 100M (ENDOMECHANICALS) IMPLANT
TUBE CALIBRATION LAPBAND (TUBING) ×3 IMPLANT
TUBING INSUFFLATION 10FT LAP (TUBING) ×3 IMPLANT
WATER STERILE IRR 500ML POUR (IV SOLUTION) ×1 IMPLANT

## 2014-01-30 NOTE — Progress Notes (Deleted)
Pt vomited a small (unmeasurable) amount of dark bloody fluid. I medicated the pt with zofran and pain medicine the pt is now calm and resting. MD on call notified. Will continue to monitor.

## 2014-01-30 NOTE — H&P (View-Only) (Signed)
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 She is lost T282 but has been unable to lose any further weight. She had a left knee replacement last year and will likely need another right knee replacement by Dr. Erasmo Score. Along the way she had DVT and pulmonary embolism and had a temporary filter placed for her for surgery due which was removed. She subsequently had a cervical disc operation and had a permanent Greenfield filter placed at that time. Her workup did not reveal any genetic predisposition towards DVT.  She's been our seminars and is very anxious and having a band of her bypass to try to help her lose more weight down into the low 200s. This hopefully will help her significant arthritis. Likely help her knee replacements be more successful.  Past Medical History   Diagnosis  Date   .  Hypertension    .  Arthritis    .  Asthma    .  Morbid obesity    .  PE (pulmonary embolism)    .  DVT (deep venous thrombosis)    .  GERD (gastroesophageal reflux disease)    .  Dysfunctional uterine bleeding    .  Headache(784.0)      MIGRAINES IN PAST   .  Sleep apnea, obstructive      USES C-PAP   .  Glaucoma     Past Surgical History   Procedure  Laterality  Date   .  Posterior laminectomy / decompression cervical spine   01/26/2011   .  Glaucoma surgery   10/31/2009   .  Gastric bypass   [id number]   .  Knee arthroscopy   12/2007   .  Dilation and curettage of uterus     .  Hysteroscopy       X2   .  Eye surgery   11/16/2009     FOR GLAUCOMA AND LASER SURGERY X2   .  Cataract surg     .  Ivc filter       2010 - THEN REMOVED THEN PLACED AGAIN 2012   .  Total knee arthroplasty   09/09/2011     Procedure: TOTAL KNEE ARTHROPLASTY;  Surgeon: Johnn Hai, MD; Location: [facility]; Service: Orthopedics; Laterality: Left;   .  Capsulotomy   05/03/2012     Procedure: MINOR CAPSULOTOMY; Surgeon: Myrtha Mantis., MD; Location: [place]; Service: Ophthalmology; Laterality: Right;   .  Yag laser application   8/56/3149     Procedure: YAG LASER APPLICATION; Surgeon: Myrtha Mantis., MD; Location: [place]; Service: Ophthalmology; Laterality: Right;   .  Capsulotomy   05/05/2012     Procedure: MINOR CAPSULOTOMY; Surgeon: Myrtha Mantis., MD; Location: [place]; Service: Ophthalmology; Laterality: Right;   .  Yag laser application   10/19/6376     Procedure: YAG LASER APPLICATION; Surgeon: Myrtha Mantis., MD; Location: [place]; Service: Ophthalmology; Laterality: Right;   .  Abdominal hysterectomy      Current Outpatient Prescriptions   Medication  Sig  Dispense  Refill   .  ALPRAZolam (XANAX) 0.25 MG tablet  Take 0.25 mg by mouth at bedtime as needed. sleep     .  b complex vitamins tablet  Take 1 tablet by mouth daily.     .  bimatoprost (LUMIGAN) 0.01 % SOLN  Place 1 drop into both eyes at bedtime.     .  brinzolamide (AZOPT) 1 % ophthalmic suspension  Place 1 drop into both eyes 3 (three) times daily.     .  Calcium Carbonate-Vit D-Min (CALTRATE 600+D PLUS MINERALS PO)  Take 2 tablets by mouth 2 (two) times daily.     .  celecoxib (CELEBREX) 200 MG capsule  Take 200 mg by mouth 2 (two) times daily.     .  ciclesonide (ALVESCO) 160 MCG/ACT inhaler  Inhale 1 puff into the lungs 2 (two) times daily.     .  Cyanocobalamin (VITAMIN B-12) 2500 MCG SUBL  Place 2 tablets under the tongue at bedtime.     .  cyclobenzaprine (FLEXERIL) 10 MG tablet  Take 10 mg by mouth at bedtime as needed. FOR MUSCLE SPASMS     .  diclofenac sodium (VOLTAREN) 1 % GEL  Apply 1 application topically 4 (four) times daily as needed. For joint pain     .  diltiazem (DILACOR XR) 240 MG 24 hr capsule  Take 240 mg by mouth at bedtime.     Marland Kitchen   esomeprazole (NEXIUM) 40 MG capsule  Take 40 mg by mouth daily before breakfast.     .  fexofenadine (ALLEGRA) 180 MG tablet  Take 180 mg by mouth every morning.     .  fish oil-omega-3 fatty acids 1000 MG capsule  Take 1 g by mouth 2 (two) times daily.     Marland Kitchen  levalbuterol (XOPENEX) 1.25 MG/3ML nebulizer solution  Take 1.25 mg by nebulization every 6 (six) hours as needed. For shortness of breath/wheezing     .  loratadine-pseudoephedrine (ALAVERT ALLERGY/SINUS) 5-120 MG per tablet  Take 1 tablet by mouth 2 (two) times daily. For sinus drainage     .  magnesium chloride (SLOW-MAG) 64 MG TBEC  Take 1-2 tablets by mouth 2 (two) times daily. Patient takes 2 tablet every morning and 1 tablet with dinner     .  mometasone (NASONEX) 50 MCG/ACT nasal spray  Place 2 sprays into the nose daily.     .  montelukast (SINGULAIR) 10 MG tablet  Take 10 mg by mouth at bedtime.     .  Multiple Vitamin (MULTIVITAMIN) tablet  Take 2 tablets by mouth at bedtime.     .  niacin 500 MG tablet  Take 500 mg by mouth daily with breakfast.     .  Olopatadine HCl (PATADAY) 0.2 % SOLN  Apply 1 drop to eye 2 (two) times daily as needed.     Marland Kitchen  oxyCODONE (OXYCONTIN) 10 MG 12 hr tablet  Take 10 mg by mouth every 12 (twelve) hours as needed. For pain     .  potassium chloride SA (K-DUR,KLOR-CON) 20 MEQ tablet  Take 20-40 mEq by mouth 2 (two) times daily. 40 meq with breakfast and 20 meq with dinner     .  Propylene Glycol (SYSTANE BALANCE) 0.6 % SOLN  Place 1 drop into both eyes 3 (three) times daily. In right eye     .  protein supplement (RESOURCE BENEPROTEIN) POWD  Take 6 g by mouth 3 (three) times daily with meals.     .  quiNINE (QUALAQUIN) 324 MG capsule  Take 324 mg by mouth at bedtime.     Marland Kitchen  QVAR 80  MCG/ACT inhaler      .  Rivaroxaban (XARELTO) 20 MG TABS  Take 20 mg by mouth daily.     .  valsartan-hydrochlorothiazide (DIOVAN-HCT) 80-12.5 MG per tablet  Take 1 tablet by mouth daily.     .  Vitamin D, Ergocalciferol,  (DRISDOL) 50000 UNITS CAPS capsule      .  iron polysaccharides (NIFEREX) 150 MG capsule  Take 1 capsule (150 mg total) by mouth 2 times daily at 12 noon and 4 pm.  60 capsule  1    No current facility-administered medications for this visit.   Beta adrenergic blockers; Penicillins; Sulfa antibiotics; and Thimerosal  Family History   Problem  Relation  Age of Onset   .  Glaucoma     .  Cerebral aneurysm     .  Stroke     .  Arthritis  Mother    .  Hypertension  Mother    .  Diabetes  Mother    .  Arthritis  Father    .  Diabetes  Father    .  Alcohol abuse  Father    Social History: reports that she has never smoked. She has never used smokeless tobacco. She reports that she does not drink alcohol or use illicit drugs.  REVIEW OF SYSTEMS - PERTINENT POSITIVES ONLY:  Positive for history of DVT and some asthma. Positive for arthritis. All of systems negative.  Physical Exam:  Blood pressure 140/90, pulse 104, resp. rate 16, height 5' 4.75" (1.645 m), weight 282 lb (127.914 kg).  Body mass index is 47.27 kg/(m^2).  Gen: WDWN African American female NAD  Neurological: Alert and oriented to person, place, and time. Motor and sensory function is grossly intact  Head: Normocephalic and atraumatic.  Eyes: Conjunctivae are normal. Pupils are equal, round, and reactive to light. No scleral icterus.  Neck: Normal range of motion. Neck supple. No tracheal deviation or thyromegaly present.  Cardiovascular: SR without murmurs or gallops. No carotid bruits  Respiratory: Effort normal. No respiratory distress. No chest wall tenderness. Breath sounds normal. No wheezes, rales or rhonchi.  Abdomen: Obese nontender  GU:  Musculoskeletal: Normal range of motion. Extremities are nontender. No cyanosis, edema or clubbing noted Lymphadenopathy: No cervical, preauricular, postauricular or axillary adenopathy is present Skin: Skin is warm and dry. No rash noted. No diaphoresis. No erythema. No pallor.  Pscyh: Normal mood and affect. Behavior is normal. Judgment and thought content normal.  LABORATORY RESULTS:  No results found for this or any previous visit (from the past 48 hour(s)).  RADIOLOGY RESULTS:  No results found.  Problem List:  Patient Active Problem List    Diagnosis  Date Noted   .  Anemia  09/10/2011   .  ARF (acute renal failure)  09/10/2011   .  Paresthesias  09/10/2011   .  Total knee replacement status, left  09/10/2011   .  Cervical spondylosis with myelopathy  06/30/2011   .  Osteoarthritis of both knees  06/30/2011   .  Diabetes mellitus    .  Arthritis    .  Sleep apnea, obstructive    .  Morbid obesity    .  Dysfunctional uterine bleeding    .  HEPATITIS C  01/25/2008   .  HYPERTENSION  01/25/2008   .  C V A / STROKE  01/25/2008   .  ASTHMA  01/25/2008   .  SLEEP APNEA  01/25/2008   .  DYSPNEA  01/25/2008   Assessment & Plan:  Prior gastric bypass done 4 years ago with weight plateau and despite many attempts to lose more weight with her gastric bypass is now frustrated and wants to have a band of her bypass. Will likely get an upper GI series to assess the size of her pouch and we'll try to move toward band over bypass to further restrict her and help her lose more weight.  Matt B. Hassell Done, MD, [facility]  [phone] beeper  [phone]

## 2014-01-30 NOTE — Interval H&P Note (Signed)
History and Physical Interval Note:  01/30/2014 7:25 AM  [name]  has presented today for surgery, with the diagnosis of Morbid Obesity  The various methods of treatment have been discussed with the patient and family. After consideration of risks, benefits and other options for treatment, the patient has consented to  Procedure(s): LAPAROSCOPIC GASTRIC BANDING (N/A) as a surgical intervention .  The patient's history has been reviewed, patient examined, no change in status, stable for surgery.  I have reviewed the patient's chart and labs.  Questions were answered to the patient's satisfaction.     Maciel Kegg B

## 2014-01-30 NOTE — Transfer of Care (Signed)
Immediate Anesthesia Transfer of Care Note  Patient: [name]  Procedure(s) Performed: Procedure(s): LAPAROSCOPIC GASTRIC BANDING over bypass  (N/A) HERNIA REPAIR HIATAL UPPER GI ENDOSCOPY (N/A)  Patient Location: PACU  Anesthesia Type:General  Level of Consciousness: awake, alert  and oriented  Airway & Oxygen Therapy: Patient Spontanous Breathing and Patient connected to face mask oxygen  Post-op Assessment: Report given to PACU RN and Post -op Vital signs reviewed and stable  Post vital signs: Reviewed and stable  Complications: No apparent anesthesia complications

## 2014-01-30 NOTE — Brief Op Note (Signed)
01/30/2014  10:21 AM  PATIENT:  [name]  53 y.o. female  PRE-OPERATIVE DIAGNOSIS:  Morbid Obesity  POST-OPERATIVE DIAGNOSIS:  Morbid Obesity  PROCEDURE:  Procedure(s): LAPAROSCOPIC GASTRIC BANDING over bypass  (N/A) HERNIA REPAIR HIATAL UPPER GI ENDOSCOPY (N/A)  SURGEON:  Surgeon(s) and Role:    * Kaylyn Lim, MD - Primary    * Excell Seltzer, MD - Assisting  PHYSICIAN ASSISTANT:   ASSISTANTS: Adonis Housekeeper, MD, FACS   ANESTHESIA:   general  EBL:  Total I/O In: 1800 [I.V.:1800] Out: 10 [Blood:10]  BLOOD ADMINISTERED:none  DRAINS: none   LOCAL MEDICATIONS USED:  BUPIVICAINE   SPECIMEN:  No Specimen  DISPOSITION OF SPECIMEN:  N/A  COUNTS:  YES  TOURNIQUET:  * No tourniquets in log *  DICTATION: .Other Dictation: Dictation Number [id number]  PLAN OF CARE: Admit to inpatient   PATIENT DISPOSITION:  PACU - hemodynamically stable.   Delay start of Pharmacological VTE agent (>24hrs) due to surgical blood loss or risk of bleeding: no

## 2014-01-30 NOTE — Anesthesia Preprocedure Evaluation (Addendum)
Anesthesia Evaluation  Patient identified by MRN, date of birth, ID band Patient awake    Reviewed: Allergy & Precautions, H&P , NPO status , Patient's Chart, lab work & pertinent test results  Airway Mallampati: III TM Distance: <3 FB Neck ROM: Full    Dental no notable dental hx.    Pulmonary asthma , sleep apnea and Continuous Positive Airway Pressure Ventilation , PE breath sounds clear to auscultation  Pulmonary exam normal       Cardiovascular hypertension, Pt. on medications + Peripheral Vascular Disease Rhythm:Regular Rate:Normal     Neuro/Psych CVA negative psych ROS   GI/Hepatic negative GI ROS, Neg liver ROS, (+) Hepatitis -  Endo/Other  diabetesMorbid obesity  Renal/GU negative Renal ROS  negative genitourinary   Musculoskeletal negative musculoskeletal ROS (+)   Abdominal   Peds negative pediatric ROS (+)  Hematology negative hematology ROS (+)   Anesthesia Other Findings   Reproductive/Obstetrics negative OB ROS                          Anesthesia Physical Anesthesia Plan  ASA: IV  Anesthesia Plan: General   Post-op Pain Management:    Induction: Intravenous  Airway Management Planned: Oral ETT  Additional Equipment:   Intra-op Plan:   Post-operative Plan: Extubation in OR  Informed Consent: I have reviewed the patients History and Physical, chart, labs and discussed the procedure including the risks, benefits and alternatives for the proposed anesthesia with the patient or authorized representative who has indicated his/her understanding and acceptance.   Dental advisory given  Plan Discussed with: CRNA and Surgeon  Anesthesia Plan Comments:         Anesthesia Quick Evaluation

## 2014-01-30 NOTE — Op Note (Signed)
[name], [name]               ACCOUNT NO.:  [id number]  MEDICAL RECORD NO.:  [id number]  LOCATION:  [facility]                         FACILITY:  [facility]  PHYSICIAN:  Isabel Caprice. Hassell Done, MD  DATE OF BIRTH:  [birth year]  DATE OF PROCEDURE:  01/30/2014 DATE OF DISCHARGE:                              OPERATIVE REPORT   PREOPERATIVE DIAGNOSIS:  Prior Roux-en-Y gastric bypass over at [facility] by Dr. Lajoyce Corners with weight loss, but not sufficient and the patient currently having arthritis and desired further weight loss to improve her orthopedic interventions and her arthritis.  PROCEDURE:  Laparoscopic repair of a posterior hiatal hernia, placement of APL Lap-Band with plication.  SURGEON:  Isabel Caprice. Hassell Done, MD.  ASSISTANTMarland Kitchen T. Hoxworth, M.D.  ANESTHESIA:  General endotracheal.  DESCRIPTION OF PROCEDURE:  The patient was taken to room 1, given general anesthesia.  The abdomen was prepped with PCMX and draped sterilely.  Access to the abdomen was achieved through the left upper quadrant with a 5-mm Optiview.  Subsequently, multiple 5 mm were placed and then I used an 11-12 slightly to the right umbilicus and then a 15 placed obliquely laterally.  The operation began with insertion of the [facility] and takedown of the adhesions to the liver from the gastrojejunostomy.  These were taken down with sharp dissection and then we worked our way up to the hiatus.  This took about 45 minutes of dissection and once this was done, we then identified the right crus by going through the gastrohepatic membrane and then did a dissection posteriorly.  I identified both the right and left crura.  We reduced the stomach and the stomach was in this abdomen.  I then used the band Passer to get around the EG junction and passed an APL band.  With that in place and had retracting laterally, I then repaired the posterior hiatus with a single suture using the Endo stitch and 0 Surgidac in the tie knot.  This  snugged up nicely.  The band was then situated and clamped where the black mark was showing and buckled.  It was then held down and plicated with three sutures of Surgidac 2-0 with tie knots using the remnant stomach to plicate up on to the pouch.  Once completed, I brought the tubing out through the lower port on the right.  Dr. Excell Seltzer was then scoped the patient, we looked at the EG junction, which appeared to be below the diaphragm and then went into the pouch and then we visualized what appeared to be a fairly patulous gastrojejunostomy.  The pouch over with the band in place did seem to be restricted.  The Lap-Band port was placed through the lower port on the right side and secured in the subcutaneous tissue. The ports were injected with Exparel, the abdomen had been inspected during the endoscopy and no bubbles were seen.  There was no bleeding noted.  The wounds were closed with 4-0 Vicryl subcutaneously and Dermabond.  The patient tolerated the procedure well and was taken to the recovery room in satisfactory condition.  Status post laparoscopic exposure of the foregut, posterior repair of the hiatal hernia, Lap-Band APL over bypass.  Isabel Caprice Hassell Done, MD     MBM/MEDQ  D:  01/30/2014  T:  01/30/2014  Job:  [id number]

## 2014-01-30 NOTE — Anesthesia Postprocedure Evaluation (Signed)
  Anesthesia Post-op Note  Patient: [name]  Procedure(s) Performed: Procedure(s) (LRB): LAPAROSCOPIC GASTRIC BANDING over bypass  (N/A) HERNIA REPAIR HIATAL UPPER GI ENDOSCOPY (N/A)  Patient Location: PACU  Anesthesia Type: General  Level of Consciousness: awake and alert   Airway and Oxygen Therapy: Patient Spontanous Breathing  Post-op Pain: mild  Post-op Assessment: Post-op Vital signs reviewed, Patient's Cardiovascular Status Stable, Respiratory Function Stable, Patent Airway and No signs of Nausea or vomiting  Last Vitals:  Filed Vitals:   01/30/14 1412  BP: 126/70  Pulse: 71  Temp: 36.6 C  Resp: 15    Post-op Vital Signs: stable   Complications: No apparent anesthesia complications

## 2014-01-31 ENCOUNTER — Encounter (HOSPITAL_COMMUNITY): Payer: Self-pay | Admitting: Surgery

## 2014-01-31 ENCOUNTER — Inpatient Hospital Stay (HOSPITAL_COMMUNITY): Payer: BC Managed Care – PPO

## 2014-01-31 LAB — CBC WITH DIFFERENTIAL/PLATELET
Basophils Absolute: 0 10*3/uL (ref 0.0–0.1)
Basophils Relative: 0 % (ref 0–1)
Eosinophils Absolute: 0 10*3/uL (ref 0.0–0.7)
Eosinophils Relative: 0 % (ref 0–5)
HCT: 38.8 % (ref 36.0–46.0)
Hemoglobin: 13 g/dL (ref 12.0–15.0)
Lymphocytes Relative: 11 % — ABNORMAL LOW (ref 12–46)
Lymphs Abs: 0.9 10*3/uL (ref 0.7–4.0)
MCH: 26.9 pg (ref 26.0–34.0)
MCHC: 33.5 g/dL (ref 30.0–36.0)
MCV: 80.2 fL (ref 78.0–100.0)
Monocytes Absolute: 0.9 10*3/uL (ref 0.1–1.0)
Monocytes Relative: 10 % (ref 3–12)
Neutro Abs: 6.9 10*3/uL (ref 1.7–7.7)
Neutrophils Relative %: 79 % — ABNORMAL HIGH (ref 43–77)
Platelets: 151 10*3/uL (ref 150–400)
RBC: 4.84 MIL/uL (ref 3.87–5.11)
RDW: 14.9 % (ref 11.5–15.5)
WBC: 8.7 10*3/uL (ref 4.0–10.5)

## 2014-01-31 MED ORDER — IOHEXOL 300 MG/ML  SOLN
50.0000 mL | Freq: Once | INTRAMUSCULAR | Status: AC | PRN
  Administered 2014-01-31: 20 mL via ORAL

## 2014-01-31 NOTE — Plan of Care (Signed)
Problem: Food- and Nutrition-Related Knowledge Deficit (NB-1.1) Goal: Nutrition education Formal process to instruct or train a patient/client in a skill or to impart knowledge to help patients/clients voluntarily manage or modify food choices and eating behavior to maintain or improve health. Outcome: Completed/Met Date Met:  01/31/14 Nutrition Education Note  Received consult for diet education per DROP protocol.    Discussed 2 week post op diet with pt. Emphasized that liquids must be non carbonated, non caffeinated, and sugar free. Fluid goals discussed. Pt to follow up with outpatient bariatric RD for further diet progression after 2 weeks. Multivitamins and minerals also reviewed.   PMHx of gastric bypass. Answered pt's questions regarding the type of vitamins to take. Recommended pt switch to chewable Centrum for adults vs. Children's. Discussed the importance of consuming protein first before other fluids.  Teach back method used, pt expressed understanding, expect good compliance.   Diet: First 2 Weeks  You will see the nutritionist about two (2) weeks after your surgery. The nutritionist will increase the types of foods you can eat if you are handling liquids well:  If you have severe vomiting or nausea and cannot handle clear liquids lasting longer than 1 day, call your surgeon  Protein Shake  Drink at least 2 ounces of shake 5-6 times per day  Each serving of protein shakes (usually 8 - 12 ounces) should have a minimum of:  15 grams of protein  And no more than 5 grams of carbohydrate  Goal for protein each day:  Men = 80 grams per day  Women = 60 grams per day  Protein powder may be added to fluids such as non-fat milk or Lactaid milk or Soy milk (limit to 35 grams added protein powder per serving)   Hydration  Slowly increase the amount of water and other clear liquids as tolerated (See Acceptable Fluids)  Slowly increase the amount of protein shake as tolerated  Sip  fluids slowly and throughout the day  May use sugar substitutes in small amounts (no more than 6 - 8 packets per day; i.e. Splenda)   Fluid Goal  The first goal is to drink at least 8 ounces of protein shake/drink per day (or as directed by the nutritionist); some examples of protein shakes are [employer], [employer], EAS Edge HP, and Unjury. See handout from pre-op Bariatric Education Class:  Slowly increase the amount of protein shake you drink as tolerated  You may find it easier to slowly sip shakes throughout the day  It is important to get your proteins in first  Your fluid goal is to drink 64 - 100 ounces of fluid daily  It may take a few weeks to build up to this  32 oz (or more) should be clear liquids  And  32 oz (or more) should be full liquids (see below for examples)  Liquids should not contain sugar, caffeine, or carbonation   Clear Liquids:  Water or Sugar-free flavored water (i.e. Fruit H2O, Propel)  Decaffeinated coffee or tea (sugar-free)  Crystal Lite, Wyler's Lite, Minute Maid Lite  Sugar-free Jell-O  Bouillon or broth  Sugar-free Popsicle: *Less than 20 calories each; Limit 1 per day   Full Liquids:  Protein Shakes/Drinks + 2 choices per day of other full liquids  Full liquids must be:  No More Than 12 grams of Carbs per serving  No More Than 3 grams of Fat per serving  Strained low-fat cream soup  Non-Fat milk  Fat-free Lactaid  Milk  Sugar-free yogurt (Dannon Lite & Fit, Greek yogurt)     Clayton Bibles, MS, RD, LDN Pager: [phone] After Hours Pager: [phone]

## 2014-01-31 NOTE — Care Management Note (Signed)
    Page 1 of 1   01/31/2014     10:46:15 AM CARE MANAGEMENT NOTE 01/31/2014  Patient:  [name], [name]   Account Number:  [id number]  Date Initiated:  01/31/2014  Documentation initiated by:  Sunday Spillers  Subjective/Objective Assessment:   53 yo female admitted s/p lap band over previous gastric bypass. PTA lived at home with roommate.     Action/Plan:   Home when stable   Anticipated DC Date:  02/01/2014   Anticipated DC Plan:  [place]  CM consult      Choice offered to / List presented to:             Status of service:  Completed, signed off Medicare Important Message given?   (If response is "NO", the following Medicare IM given date fields will be blank) Date Medicare IM given:   Medicare IM given by:   Date Additional Medicare IM given:   Additional Medicare IM given by:    Discharge Disposition:  HOME/SELF CARE  Per UR Regulation:  Reviewed for med. necessity/level of care/duration of stay  If discussed at [place] of Stay Meetings, dates discussed:    Comments:

## 2014-01-31 NOTE — Progress Notes (Signed)
Patient alert and oriented, Post op day 1.  Provided support and encouragement.  Encouraged pulmonary toilet, ambulation and small sips of liquids.  All questions answered.  Will continue to monitor. 

## 2014-01-31 NOTE — Progress Notes (Signed)
Patient ID: [name], female   DOB: 01/05/1961, 53 y.o.   MRN: [id number] [facility] Progress Note:   1 Day Post-Op  Subjective: Mental status is clear Objective: Vital signs in last 24 hours: Temp:  [97.4 F (36.3 C)-99.2 F (37.3 C)] 97.8 F (36.6 C) (10/14 0551) Pulse Rate:  [57-87] 72 (10/14 0551) Resp:  [12-18] 16 (10/14 0551) BP: (115-140)/(51-91) 129/91 mmHg (10/14 0551) SpO2:  [95 %-100 %] 100 % (10/14 0551) Weight:  [296 lb 8.3 oz (134.5 kg)] 296 lb 8.3 oz (134.5 kg) (10/14 0551)  Intake/Output from previous day: 10/13 0701 - 10/14 0700 In: 3900 [I.V.:3900] Out: 3110 [Urine:3100; Blood:10] Intake/Output this shift:    Physical Exam: Work of breathing is not elevated.  Incisions are ok.    Lab Results:  Results for orders placed during the hospital encounter of 01/30/14 (from the past 48 hour(s))  PREGNANCY, URINE     Status: None   Collection Time    01/30/14  6:13 AM      Result Value Ref Range   Preg Test, Ur NEGATIVE  NEGATIVE   Comment:            THE SENSITIVITY OF THIS     METHODOLOGY IS >20 mIU/mL.  CBC     Status: Abnormal   Collection Time    01/30/14 11:53 AM      Result Value Ref Range   WBC 10.0  4.0 - 10.5 K/uL   RBC 4.72  3.87 - 5.11 MIL/uL   Hemoglobin 12.8  12.0 - 15.0 g/dL   HCT 37.3  36.0 - 46.0 %   MCV 79.0  78.0 - 100.0 fL   MCH 27.1  26.0 - 34.0 pg   MCHC 34.3  30.0 - 36.0 g/dL   RDW 14.6  11.5 - 15.5 %   Platelets 128 (*) 150 - 400 K/uL  CREATININE, SERUM     Status: Abnormal   Collection Time    01/30/14 11:53 AM      Result Value Ref Range   Creatinine, Ser 1.15 (*) 0.50 - 1.10 mg/dL   GFR calc non Af Amer 53 (*) >90 mL/min   GFR calc Af Amer 61 (*) >90 mL/min   Comment: (NOTE)     The eGFR has been calculated using the CKD EPI equation.     This calculation has not been validated in all clinical situations.     eGFR's persistently <90 mL/min signify possible Chronic Kidney     Disease.  CBC WITH  DIFFERENTIAL     Status: Abnormal   Collection Time    01/31/14  5:30 AM      Result Value Ref Range   WBC 8.7  4.0 - 10.5 K/uL   RBC 4.84  3.87 - 5.11 MIL/uL   Hemoglobin 13.0  12.0 - 15.0 g/dL   HCT 38.8  36.0 - 46.0 %   MCV 80.2  78.0 - 100.0 fL   MCH 26.9  26.0 - 34.0 pg   MCHC 33.5  30.0 - 36.0 g/dL   RDW 14.9  11.5 - 15.5 %   Platelets 151  150 - 400 K/uL   Neutrophils Relative % 79 (*) 43 - 77 %   Neutro Abs 6.9  1.7 - 7.7 K/uL   Lymphocytes Relative 11 (*) 12 - 46 %   Lymphs Abs 0.9  0.7 - 4.0 K/uL   Monocytes Relative 10  3 - 12 %   Monocytes Absolute  0.9  0.1 - 1.0 K/uL   Eosinophils Relative 0  0 - 5 %   Eosinophils Absolute 0.0  0.0 - 0.7 K/uL   Basophils Relative 0  0 - 1 %   Basophils Absolute 0.0  0.0 - 0.1 K/uL    Radiology/Results: No results found.  Anti-infectives: Anti-infectives   Start     Dose/Rate Route Frequency Ordered Stop   01/30/14 0558  levofloxacin (LEVAQUIN) IVPB 750 mg     750 mg 100 mL/hr over 90 Minutes Intravenous On call to O.R. 01/30/14 0558 01/30/14 0839      Assessment/Plan: Problem List: Patient Active Problem List   Diagnosis Date Noted  . 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 Anemia 09/10/2011  . ARF (acute renal failure) 09/10/2011  . Paresthesias 09/10/2011  . Total knee replacement status, left 09/10/2011  . Cervical spondylosis with myelopathy 06/30/2011  . Osteoarthritis of both knees 06/30/2011  . Diabetes mellitus   . Arthritis   . Sleep apnea, obstructive   . Morbid obesity   . Dysfunctional uterine bleeding   . HEPATITIS C 01/25/2008  . HYPERTENSION 01/25/2008  . 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 ASTHMA 01/25/2008  . SLEEP APNEA 01/25/2008  . DYSPNEA 01/25/2008    Awaiting UGI series.  Will advance diet as indicated 1 Day Post-Op    LOS: 1 day   Matt B. Hassell Done, MD, [facility] [phone] beeper [phone]  01/31/2014 8:23 AM

## 2014-02-01 LAB — CBC WITH DIFFERENTIAL/PLATELET
Basophils Absolute: 0 10*3/uL (ref 0.0–0.1)
Basophils Relative: 0 % (ref 0–1)
Eosinophils Absolute: 0 10*3/uL (ref 0.0–0.7)
Eosinophils Relative: 0 % (ref 0–5)
HCT: 35.5 % — ABNORMAL LOW (ref 36.0–46.0)
Hemoglobin: 11.9 g/dL — ABNORMAL LOW (ref 12.0–15.0)
Lymphocytes Relative: 33 % (ref 12–46)
Lymphs Abs: 2.4 10*3/uL (ref 0.7–4.0)
MCH: 27 pg (ref 26.0–34.0)
MCHC: 33.5 g/dL (ref 30.0–36.0)
MCV: 80.5 fL (ref 78.0–100.0)
Monocytes Absolute: 0.6 10*3/uL (ref 0.1–1.0)
Monocytes Relative: 8 % (ref 3–12)
Neutro Abs: 4.4 10*3/uL (ref 1.7–7.7)
Neutrophils Relative %: 59 % (ref 43–77)
Platelets: 114 10*3/uL — ABNORMAL LOW (ref 150–400)
RBC: 4.41 MIL/uL (ref 3.87–5.11)
RDW: 15 % (ref 11.5–15.5)
WBC: 7.4 10*3/uL (ref 4.0–10.5)

## 2014-02-01 MED ORDER — DIPHENHYDRAMINE HCL 50 MG/ML IJ SOLN
25.0000 mg | Freq: Four times a day (QID) | INTRAMUSCULAR | Status: DC | PRN
  Administered 2014-02-01: 25 mg via INTRAVENOUS
  Filled 2014-02-01: qty 1

## 2014-02-01 NOTE — Discharge Summary (Signed)
Physician Discharge Summary  Patient ID: [name] MRN: [id number] DOB/AGE: 55-Jan-1962 55 y.o.  Admit date: 01/30/2014 Discharge date: 02/01/2014  Admission Diagnoses:  Weight regain after roux Y gastric bypass with reflux  Discharge Diagnoses:  same  Active Problems:   Morbid obesity   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   Surgery:  Laparoscopic repair of hiatus hernia and lapband over bypass  Discharged Condition: improved  Hospital Course:   Had surgery.  Swallow on PD 1.  Took diet ok and ready for discharge on PD 2  Consults: none  Significant Diagnostic Studies: UGI    Discharge Exam: Blood pressure 132/90, pulse 59, temperature 98.1 F (36.7 C), temperature source Oral, resp. rate 18, height 5\' 4"  (1.626 m), weight 296 lb 8.3 oz (134.5 kg), last menstrual period 07/26/2011, SpO2 100.00%. Incisions OK.    Disposition: 01-Home or Self Care  Discharge Instructions   Discharge instructions    Complete by:  As directed   Follow bariatric dietary guidelines Begin Xarelto tomorrow     Increase activity slowly    Complete by:  As directed      No wound care    Complete by:  As directed             Medication List         ALPRAZolam 0.25 MG tablet  Commonly known as:  XANAX  Take 0.25 mg by mouth at bedtime as needed. sleep     b complex vitamins tablet  Take 1 tablet by mouth every morning.     bimatoprost 0.01 % Soln  Commonly known as:  LUMIGAN  Place 1 drop into both eyes at bedtime.     brimonidine 0.1 % Soln  Commonly known as:  ALPHAGAN P  Place 1 drop into both eyes 2 (two) times daily.     brinzolamide 1 % ophthalmic suspension  Commonly known as:  AZOPT  Place 1 drop into both eyes 2 (two) times daily.     CALTRATE 600+D PLUS MINERALS PO  Take 2 tablets by mouth 2 (two) times daily.     celecoxib 200 MG capsule  Commonly known as:  CELEBREX  Take 200 mg by mouth every morning.     ciclesonide 160 MCG/ACT inhaler   Commonly known as:  ALVESCO  Inhale 1 puff into the lungs 2 (two) times daily.     cyclobenzaprine 10 MG tablet  Commonly known as:  FLEXERIL  Take 10 mg by mouth at bedtime.     diclofenac sodium 1 % Gel  Commonly known as:  VOLTAREN  Apply 1 application topically 4 (four) times daily as needed. For joint pain     diltiazem 240 MG 24 hr capsule  Commonly known as:  DILACOR XR  Take 240 mg by mouth at bedtime.     esomeprazole 40 MG capsule  Commonly known as:  NEXIUM  Take 40 mg by mouth daily before breakfast.     fish oil-omega-3 fatty acids 1000 MG capsule  Take 1 g by mouth 2 (two) times daily.     iron polysaccharides 150 MG capsule  Commonly known as:  NIFEREX  Take 150 mg by mouth 2 (two) times daily.     levalbuterol 1.25 MG/3ML nebulizer solution  Commonly known as:  XOPENEX  Take 1.25 mg by nebulization every 6 (six) hours as needed. For shortness of breath/wheezing     magnesium chloride 64 MG Tbec SR tablet  Commonly  known as:  SLOW-MAG  Take 1-2 tablets by mouth 2 (two) times daily. Patient takes 2 tablet every morning and 1 tablet with dinner     mometasone 50 MCG/ACT nasal spray  Commonly known as:  NASONEX  Place 2 sprays into the nose daily as needed (allergies).     montelukast 10 MG tablet  Commonly known as:  SINGULAIR  Take 10 mg by mouth at bedtime.     multivitamin tablet  Take 2 tablets by mouth at bedtime.     niacin 500 MG tablet  Take 500 mg by mouth daily with breakfast. Needs to be flush free per patient     PATADAY 0.2 % Soln  Generic drug:  Olopatadine HCl  Apply 1 drop to eye 2 (two) times daily as needed.     potassium chloride SA 20 MEQ tablet  Commonly known as:  K-DUR,KLOR-CON  Take 20-40 mEq by mouth 2 (two) times daily. 40 meq with breakfast and 20 meq with dinner     PRESCRIPTION MEDICATION  1 each. Cortisone shot in right knee.     pyridOXINE 100 MG tablet  Commonly known as:  VITAMIN B-6  Take 100 mg by mouth  every morning.     quiNINE 324 MG capsule  Commonly known as:  QUALAQUIN  Take 324 mg by mouth at bedtime.     SYMBICORT 160-4.5 MCG/ACT inhaler  Generic drug:  budesonide-formoterol  Inhale 2 puffs into the lungs 2 (two) times daily.     traMADol 50 MG tablet  Commonly known as:  ULTRAM  Take 1 tablet (50 mg total) by mouth every 6 (six) hours as needed.     valsartan-hydrochlorothiazide 80-12.5 MG per tablet  Commonly known as:  DIOVAN-HCT  Take 1 tablet by mouth every morning.     Vitamin B-12 2500 MCG Subl  Place 2 tablets under the tongue at bedtime.     vitamin C 1000 MG tablet  Take 1,000 mg by mouth every morning.     Vitamin D (Ergocalciferol) 50000 UNITS Caps capsule  Commonly known as:  DRISDOL  Take 50,000 Units by mouth every 7 (seven) days. Patient takes on Saturday     XARELTO 20 MG Tabs tablet  Generic drug:  rivaroxaban  Take 20 mg by mouth at bedtime.           Follow-up Information   Follow up with Pedro Earls, MD.   Specialty:  General Surgery   Contact information:   [address] [place] [address] [phone]       Signed: Pedro Earls 02/01/2014, 7:35 AM

## 2014-02-01 NOTE — Discharge Instructions (Signed)
Diet Following Bariatric Surgery The bariatric diet is designed to provide fluids and nourishment while promoting weight loss and healing after bariatric surgery. The diet is divided into 3 stages. Progress to each stage of the diet with your health care provider's approval.  WHAT DO I NEED TO KNOW ABOUT MY DIET FOLLOWING BARIATRIC SURGERY? Your surgeon may have individual guidelines for you about specific foods or the progression of your diet. Follow your surgeon's guidelines. You will follow these general guidelines during all stages of your diet:  Eat at set times.  Allow 30-45 minutes for each meal.  Take small bites. Chew your food until it is almost a liquid before swallowing it. Try setting down your utensils between bites to help yourself eat slower or make an "eat slowly" reminder sign.  Do not drink liquids for 30 minutes before meals and for 30 minutes after meals.  Drink between meals.  Stop eating when you are full. If you feel tightness or pressure in your chest, that means you are full. Wait 30 minutes before you try to eat again.  Take a chewable multivitamin daily in addition to other supplements as directed by your health care provider.  Sip at least 48-64 oz of liquid, preferably water, each day.  Stay away from concentrated sweets with more than 10 g of sugar per serving.  Protein is a very important part of the diet. Have protein with every meal when possible. Try eating your protein food first. STAGE 1 BARIATRIC DIET Stage 1 will begin after surgery and last until about 2 weeks after surgery or as directed by your health care provider. You will be on clear liquids immediately after surgery. After your health care provider approves, you will move to full liquids. You will eat at scheduled times during the day (for example at 8 AM, 12 noon, or 5 PM). You will also take a liquid protein supplement as recommended by your dietitian. Your dietitian will let you know how much  and how often you can eat.  Diet Guidelines  Limit intake to  cup of solid foods and  cup of beverages per meal.  You will need at least 60-80 g of protein daily or as determined by your dietitian. Guidelines for choosing a liquid protein supplement include:  At least 15 g of protein per 8 oz serving.  Less than 20 g total carbohydrate per 8 oz serving.  Less than 5 g fat per 8 oz serving.  Drink at least 48 oz (1440 mL) of fluid daily, which includes your protein supplement.  To get more protein, you can add 1 Tbsp non-fat dry milk powder to each  cup skim milk.  Avoid carbonated beverages, caffeine, alcohol, and concentrated sweets such as sugar, cakes, and cookies.  Take a chewable multivitamin complete with iron. Discuss additional supplements with your health care provider or dietitian. Beverages ( cup total per meal)  Decaffeinated coffee or tea.  Drinks with less than 25 g of sugar per serving.  Diet or sugar-free drinks.  Powdered drinks.  Sugar-free iced tea.  Broth.  Skim milk or lactose-free milk.  Unsweetened, plain soy milk.  Sugar-free gelatin dessert or frozen ice pops. Full Liquid Foods ( cup total per meal)  Protein-rich liquids (limit added protein powder to 25-30 g per serving).  Low-fat cream soup or soup that has been blended.  Artificially sweetened yogurt.  Sugar-free pudding.  Blended low-fat cottage cheese.  Plain yogurt or [place] yogurt (low-fat).  Unsweetened applesauce.  Hot wheat cereal, cream of rice, grits. STAGE 2 BARIATRIC DIET (SOFT OR PUREED DIET)  Stage 2 starts about 2 weeks after surgery and lasts until about 4 weeks after surgery. During this stage, you will eat soft, moist, ground, diced, or pureed foods in small meals, 3-6 times a day. Focus on protein-rich foods. You will also drink a liquid protein supplement between meals 2 times a day. After a week of soft protein foods, you can begin to add other soft foods in  addition to soft proteins. You should meet with your dietitian at this stage to begin preparation for Stage 3 of the bariatric diet.  Diet Guidelines  Meals should not exceed -1 cup total per meal.  You will need to blend solid foods to the consistency of applesauce.  Choose low-fat foods. Low-fat foods are foods with less than 5 g of fat per serving.  Include a protein with every meal and snack. Eat the protein food first. Try to eat 60-80 g of protein per day when possible.  Choose grains made from white or refined grain. Choices should have no more than 2 g of fiber per serving.  Continue to eat mindfully and slowly and always listen to your body.  Staying hydrated is very important during this stage. Full liquids from Stage 1 may be used for a meal or snack replacement.   Slowly add other soft foods to your diet. Examples of soft foods that can be added to your diet are listed in the following section. Soft Protein Foods  Well-cooked beans and lentils.  Eggs (scrambled, soft-boiled).  Tofu and other soft soy products (tempeh or bean veggie burgers).  Fish.  Lean poultry, well cooked and soft. Can also try baby food chicken or [place].  Ground meats.  Low-fat cottage cheese.  Hummus.  Fat-free or low-fat yogurt.  Gravy and light mayonnaise (to help moisten). Other Soft Foods  Soft fruit. This includes soft canned fruit in light syrup or natural juice, bananas, melons, peaches, pears, and strawberries.   Well-cooked vegetables.   Toast or crackers. Make sure these become soft by chewing them at least 20 times.  Hot wheat cereal.   Unflavored oatmeal.   Baby food or toddler fruits and vegetables.   Chicken or vegetable broth.   Blended fruit smoothies. STAGE 3 BARIATRIC DIET (REGULAR DIET) This stage starts about 6-8 weeks after surgery and will continue to promote weight loss. You will be allowed to eat foods of various textures. Ask your dietitian  to assist you in meal planning and additional behavioral strategies to make this final stage a long-term success. Diet Guidelines  Meals should not exceed -1 cup. As you heal and advance, you may be able to eat a little more with each meal. Always listen to your body.   Your diet should include foods from all the food groups.  Slowly add recommended foods to your diet. See the following section for a list of recommended foods.  Eat only at your chosen meal times.  When you feel full, stop eating.  Carbohydrates should be limited. Eat no more than 30 g per meal or 130 g per day. There are about 15-20 g of carbohydrates in 1 piece of bread or a medium piece of fruit.  Stay hydrated. Drink at least 48-64 oz of noncarbonated, zero-calorie fluid per day. Water is the best choice.  At first, avoid hard-to-digest foods like popcorn, nuts, celery, seeds, and the white parts of citrus fruits. With  time you may be able to eat these foods.  Take any vitamin supplements as directed by your health care provider.  Do not fast or skip meals. If you are having a hard time eating, talk to your health care provider or dietitian. WHAT FOODS CAN I EAT IN STAGE 3? Grains Choose whole grains when possible; aim to get half of your total grains as whole grains. These include whole wheat breads, crackers, and pastas. Cold or hot cereals with no added sugars. Rice (brown or white).  Vegetables Choose a variety of vegetables. All are allowed.  Fruits Choose a variety of fruits. All are allowed.  Meat and Other Protein Foods Choose lean sources of protein such as poultry, fish, and eggs. You may need to cook meats to tender at first. Smooth nut butters. Beans. Dairy Choose low-fat or nonfat dairy items (such as cheese, milk, and yogurt). Beverages Decaffeinated coffee. Caffeine-free tea. Sugar-free soft drinks without caffeine. Limit alcohol.  Condiments All are acceptable. Choose low-fat and  low-sodium when possible.  Sweets and Desserts Low-fat, low-sugar options. As part of a healthy diet, everyone should limit added sugars.  Fat and Oil Choose healthy fats such as olive oil, canola oil, and avocados.  The items listed above may not be a complete list of recommended foods or beverages. Contact your dietitian for more options. Document Released: 10/11/2002 Document Revised: 04/11/2013 Document Reviewed: 02/08/2013 [facility] Patient Information 2015 [place], [place]. This information is not intended to replace advice given to you by your health care provider. Make sure you discuss any questions you have with your health care provider.                    ADJUSTABLE GASTRIC BAND  Home Care Instructions   These instructions are to help you care for yourself when you go home.  Call: If you have any problems.   Call [phone] and ask for the surgeon on call   If you need immediate assistance come to the ER at [facility]. Tell the ER staff you are a new post-op gastric banding patient  Signs and symptoms to report:   Severe  vomiting or nausea o If you cannot handle clear liquids for longer than 1 day, call your surgeon   Abdominal pain which does not get better after taking your pain medication   Fever greater than 100.4  F and chills   Heart rate over 100 beats a minute   Trouble breathing   Chest pain   Redness,  swelling, drainage, or foul odor at incision (surgical) sites   If your incisions open or pull apart   Swelling or pain in calf (lower leg)   Diarrhea (Loose bowel movements that happen often), frequent watery, uncontrolled bowel movements   Constipation, (no bowel movements for 3 days) if this happens: o Take Milk of Magnesia, 2 tablespoons by mouth, 3 times a day for 2 days if needed o Stop taking Milk of Magnesia once you have had a bowel movement o Call your doctor if constipation continues Or o Take Miralax  (instead of Milk of Magnesia) following the  label instructions o Stop taking Miralax once you have had a bowel movement o Call your doctor if constipation continues   Anything you think is abnormal for you   Normal side effects after surgery:   Unable to sleep at night or unable to concentrate   Irritability   Being tearful (crying) or depressed  These are common complaints,  possibly related to your anesthesia, stress of surgery, and change in lifestyle, that usually go away a few weeks after surgery. If these feelings continue, call your medical doctor.  Wound Care: You may have surgical glue, steri-strips, or staples over your incisions after surgery   Surgical glue: Looks like clear film over your incisions and will wear off a little at a time   Steri-strips: Adhesive strips of tape over your incisions. You may notice a yellowish color on skin under the steri-strips. This is used to make the steri-strips stick better. Do not pull the steri-strips off - let them fall off   Staples: Staples may be removed before you leave the hospital o If you go home with staples, call [facility] for an appointment with your surgeons nurse to have staples removed 10 days after surgery, [phone]   Showering: You may shower two (2) days after your surgery unless your surgeon tells you differently o Wash gently around incisions with warm soapy water, rinse well, and gently pat dry o If you have a drain (tube from your incision), you may need someone to hold this while you shower o No tub baths until staples are removed and incisions are healed   Medications:   Medications should be liquid or crushed if larger than the size of a dime   Extended release pills (medication that releases a little bit at a time through the  day) should not be crushed   Depending on the size and number of medications you take, you may need to space (take a few throughout the day)/change the time you take your medications so that you do not over-fill your  pouch (smaller stomach)   Make sure you follow-up with you primary care physician to make medication changes needed during rapid weight loss and life -style changes   If you have diabetes, follow up with your doctor that orders your diabetes medication(s) within one week after surgery and check your blood sugar regularly    Do not drive while taking narcotics (pain medications)    Do not take acetaminophen (Tylenol) and Roxicet or Lortab Elixir at the same time since these pain medications contain acetaminophen   Diet:  First 2 Weeks You will see the nutritionist about two (2) weeks after your surgery. The nutritionist will increase the types of foods you can eat if you are handling liquids well:   If you have severe vomiting or nausea and cannot handle clear liquids lasting longer than 1 day call your surgeon For Same Day Surgery Discharge Patients:   The day of surgery drink water only: 2 ounces every 4 hours   If you are handling water, start drinking your high protein shake the next morning For Overnight Stay Patients:   Begin by drinking 2 ounces of a high protein every 3 hours, 5-6 times per day   Slowly increase the amount you drink as tolerated   You may find it easier to slowly sip shakes throughout the day. It is important to get your proteins in first    Protein Shake   Drink at least 2 ounces of shake 5-6 times per day   Each serving of protein shakes (usually 8-12 ounces) should have a minimum of: o 15 grams of protein o And no more than 5 grams of carbohydrate   Goal for protein each day: o Men = 80 grams per day o Women = 60 grams per day   Protein powder may be  added to fluids such as non-fat milk or Lactaid milk or Soy milk (limit to 35 grams added protein powder per serving)  Hydration   Slowly increase the amount of water and other clear liquids as tolerated (See Acceptable Fluids)   Slowly increase the amount of protein shake as tolerated   Sip fluids slowly and  throughout the day   May use sugar substitutes in small amounts (no more than 6-8 packets per day; i.e. Splenda)  Fluid Goal   The first goal is to drink at least 8 ounces of protein shake/drink per day (or as directed by the nutritionist); some examples of protein shakes are Syntrax, Nectar, [employer], EAS Edge HP, and Unjury. - See handout from pre-op Bariatric Education Class: o Slowly increase the amount of protein shake you drink as tolerated o You may find it easier to slowly sip shakes throughout the day o It is important to get your proteins in first   Your fluid goal is to drink 64-100 ounces of fluid daily o It may take a few weeks to build up to this    32 oz. (or more) should be full liquids (see below for examples)   Liquids should not contain sugar, caffeine, or carbonation  Clear Liquids:   Water of Sugar-free flavored water (i.e. Fruit HO, Propel)   Decaffeinated coffee or tea (sugar-free)   Crystal lite, Wylers Lite, Minute Maid Lite   Sugar-free Jell-O   Bouillon or broth   Sugar-free Popsicle:    - Less than 20 calories each; Limit 1 per day        Full Liquids:                   Protein Shakes/Drinks + 2 choices per day of other full liquids   Full liquids must be: o No More Than 12 grams of Carbs per serving o No More Than 3 grams of Fat per serving   Strained low-fat cream soup   Non-Fat milk   Fat-free Lactaid Milk   Sugar-free yogurt (Dannon Lite & Fit, Greek yogurt)   Vitamins and Minerals   Start 1 day after surgery unless otherwise directed by your surgeon   1 Chewable Multivitamin / Multimineral Supplement with iron (i.e. Centrum for Adults)   Chewable Calcium Citrate with Vitamin D-3 (Example: 3 Chewable Calcium  Plus 600 with vitamin D-3) o Take 500 mg three (3) times a day for a total of 1500 mg per day o Do not take all 3 doses of calcium at one time as it may cause constipation, and you can only absorb 500 mg at a time o Do not mix  multivitamins containing iron with calcium supplements;  take 2 hours apart o Do not substitute Tums (calcium carbonate) for your calcium   Menstruating women and those at risk for anemia ( a blood disease that causes weakness) may need extra iron o Talk to your doctor to see if you need more iron   If you need extra iron: total daily iron recommendation (including Vitamins) is 50 to 100 mg Iron/day   Do not stop taking or change any vitamins or minerals until you talk to your nutritionist or surgeon   Your nutritionist and/or surgeon must approve all vitamin and mineral supplements  Activity and Exercise: It is important to continue walking at home. Limit your physical activity as instructed by your doctor. During this time, use these guidelines:   Do not lift anything greater than ten  (  10) pounds for at least two (2) weeks   Do not go back to work or drive until Engineer, production says you can   You may have sex when you feel comfortable o It is VERY important for female patients to use a reliable birth control method; fertility often increase after surgery o Do not get pregnant for at least 18 months   Start exercising as soon as your doctor tells you that you can o Make sure your doctor approves any physical activity   Start with a simple walking program   Walk 5-15 minutes each day, 7 days per week   Slowly increase until you are walking 30-45 minutes per day   Consider joining our [place] program. [phone] or email [email or web address]    Special Instructions  Things to remember:   Free counseling is available for you and your family through collaboration between [facility] and [place]. Please call [phone] and leave a message   Use your CPAP when sleeping if this applies to you   Consider buying a medical alert bracelet that says you had lap-band surgery   You will likely have your first fill (fluid added to your band) 6 - 8 weeks after surgery   [facility] has a free Bariatric  Surgery Support Group that meets monthly, the 3rd Thursday, [place]. You can see classes online at [email or web address]   It is very important to keep all follow up appointments with your surgeon, nutritionist, primary care physician, and behavioral health practitioner o After the first year, please follow up with your bariatric surgeon and nutritionist at least once a year in order to maintain best weight loss results                    [facility]:  [place]: [phone]               Bariatric Nurse Coordinator: [phone]      Adjustable Gastric Band Home Care Instructions  Rev. 05/2012                                                                  Reviewed and Endorsed                                                   by [facility] Patient Education Committee, Jan, 2014

## 2014-02-01 NOTE — Progress Notes (Signed)
Patient is alert and oriented.  Pain is controlled, and patient is tolerating fluids.  Advance to protein shake yesterday and patient tolerated well.  Reviewed Adjustable gastric band discharge instructions with patient, patient able to articulate understanding.  Provided information on BELT program, Support Group and [facility] outpatient pharmacy. All questions answered, will continue to monitor.

## 2014-02-02 ENCOUNTER — Telehealth (HOSPITAL_COMMUNITY): Payer: Self-pay

## 2014-02-02 NOTE — Telephone Encounter (Signed)
Made discharge phone call to patient per DROP protocol. Asking the following questions.    1. Do you have someone to care for you now that you are home?  yes 2. Are you having pain now that is not relieved by your pain medication?  no 3. Are you able to drink the recommended daily amount of fluids (48 ounces minimum/day) and protein (60-80 grams/day) as prescribed by the dietitian or nutritional counselor?  yes 4. Are you taking the vitamins and minerals as prescribed?  yes 5. Do you have the "on call" number to contact your surgeon if you have a problem or question?  yes 6. Are your incisions free of redness, swelling or drainage? (If steri strips, address that these can fall off, shower as tolerated) yes 7. Have your bowels moved since your surgery?  If not, are you passing gas?  No/yes 8. Are you up and walking 3-4 times per day?  yes    1. Do you have an appointment made to see your surgeon in the next month?  yes 2. Were you provided your discharge medications before your surgery or before you were discharged from the hospital and are you taking them without problem?  yes 3. Were you provided phone numbers to the clinic/surgeon's office?  yes 4. Did you watch the patient education video module in the (clinic, surgeon's office, etc.) before your surgery? yes 5. Do you have a discharge checklist that was provided to you in the hospital to reference with instructions on how to take care of yourself after surgery?  yes 6. Did you see a dietitian or nutritional counselor while you were in the hospital?  yes 7. Do you have an appointment to see a dietitian or nutritional counselor in the next month?  yes   

## 2014-02-13 ENCOUNTER — Encounter: Payer: BC Managed Care – PPO | Attending: Surgery | Admitting: Dietician

## 2014-02-13 DIAGNOSIS — Z6841 Body Mass Index (BMI) 40.0 and over, adult: Secondary | ICD-10-CM | POA: Diagnosis not present

## 2014-02-13 NOTE — Progress Notes (Signed)
Bariatric Class:  Appt start time: 405 end time: 440  2 Week Post-Operative Nutrition Class (individual appointment)  Patient was seen individually on 02/13/2014 for Post-[facility]ative [facility]   Ms. Rauber states that she has been drinking Premier protein shakes, water, chicken and vegetable broth, and having [place] yogurt. Trying to have 2 oz of protein shake every hour. She is concerned that she has only had 3 bowel movements in 2 weeks and having hard stools. Plans to see PCP for medication management next week and plans to get a band fill on Thursday.    Surgery date: 01/30/14  Surgery type: LAGB (banding RYGB pouch)  Start weight at [facility]: 290 lbs on 02/08/14  Weight today: 272.6 lbs Weight change: 20 lbs    TANITA BODY COMP RESULTS   01/15/14  02/13/14  BMI (kg/m^2)  49.5  No tanita  Fat Mass (lbs)  132.5    Fat Free Mass (lbs)  160.5    Total Body Water (lbs)  117.5       The following the learning objectives were met by the patient during this course:  Identifies Phase 3A (Soft, High Proteins) Dietary Goals and will begin from 2 weeks post-operatively to 2 months post-operatively  Identifies appropriate sources of fluids and proteins   States protein recommendations and appropriate sources post-operatively  Identifies the need for appropriate texture modifications, mastication, and bite sizes when consuming solids  Identifies appropriate multivitamin and calcium sources post-operatively  Describes the need for physical activity post-operatively and will follow MD recommendations  States when to call healthcare provider regarding medication questions or post-operative complications  Handouts given during class include:  Phase 3A: Soft, High Protein Diet Handout  Band Fill Guidelines Handout  Follow-Up Plan: Patient will follow-up at [facility] in 4 weeks for 6 week post-op nutrition visit for diet advancement per  MD.

## 2014-03-09 ENCOUNTER — Other Ambulatory Visit (HOSPITAL_COMMUNITY): Payer: Self-pay | Admitting: *Deleted

## 2014-03-09 NOTE — Patient Instructions (Addendum)
20     Your procedure is scheduled on:  Thursday 03/22/2014  Report to [facility] Main Entrance and follow signs to Short Stay  At  0830 AM.  Call this number if you have problems the morning of surgery: [phone]                    Remember:          Do not eat food or drink liquids AFTER MIDNIGHT!  Take these medicines the morning of surgery with A SIP OF WATER:  Nexium, use eye drops if needed, use Symbicort inhaler, Qvar inhaler and use Xopenex inhaler if needed and bring inhalers with you to              [place].  Marland Kitchen  Leave suitcase in the car. After surgery it may be brought to your room.     DO NOT WEAR  JEWELRY,MAKE-UP,LOTIONS,POWDERS,PERFUMES,CONTACTS , DENTURES, BRIDGEWORK AND DO NOT WEAR FALSE EYELASHES  !                                  Patients discharged the day of surgery will not be allowed to drive home.  If going home the same day of surgery, must have someone stay with you first 24 hrs.at home and arrange for someone to drive you home from the [place]: N/A   Special Instructions:              Please read over the following fact sheets that you were given:             1. [place] - Preparing for Surgery Before surgery, you can play an important role.  Because skin is not sterile, your skin needs to be as free of germs as possible.  You can reduce the number of germs on your skin by washing with CHG (chlorahexidine gluconate) soap before surgery.  CHG is an antiseptic cleaner which kills germs and bonds with the skin to continue killing germs even after washing. Please DO NOT use if you have an allergy to CHG or antibacterial soaps.  If your skin becomes reddened/irritated stop using the CHG and  inform your nurse when you arrive at Short Stay. Do not shave (including legs and underarms) for at least 48 hours prior to the first CHG shower.  You may shave your face/neck. Please follow these instructions carefully:  1.  Shower with CHG Soap the night before surgery and the  morning of Surgery.  2.  If you choose to wash your hair, wash your hair first as usual with your  normal  shampoo.  3.  After you shampoo, rinse your hair and body thoroughly to remove the  shampoo.  4.  Use CHG as you would any other liquid soap.  You can apply chg directly  to the skin and wash                       Gently with a scrungie or clean washcloth.  5.  Apply the CHG Soap to your body ONLY FROM THE NECK DOWN.   Do not use on face/ open                           Wound or open sores. Avoid contact with eyes, ears mouth and genitals (private parts).                       Wash face,  Genitals (private parts) with your normal soap.             6.  Wash thoroughly, paying special attention to the area where your surgery  will be performed.  7.  Thoroughly rinse your body with warm water from the neck down.  8.  DO NOT shower/wash with your normal soap after using and rinsing off  the CHG Soap.                9.  Pat yourself dry with a clean towel.            10.  Wear clean pajamas.            11.  Place clean sheets on your bed the night of your first shower and do not  sleep with pets. Day of Surgery : Do not apply any lotions/deodorants the morning of surgery.  Please wear clean clothes to the hospital/surgery center.  FAILURE TO FOLLOW THESE INSTRUCTIONS MAY RESULT IN THE CANCELLATION OF YOUR SURGERY PATIENT SIGNATURE_________________________________  NURSE SIGNATURE__________________________________  ________________________________________________________________________   [name]  An incentive spirometer is a tool that can help keep your lungs clear and  active. This tool measures how well you are filling your lungs with each breath. Taking long deep breaths may help reverse or decrease the chance of developing breathing (pulmonary) problems (especially infection) following:  A long period of time when you are unable to move or be active. BEFORE THE PROCEDURE   If the spirometer includes an indicator to show your best effort, your nurse or respiratory therapist will set it to a desired goal.  If possible, sit up straight or lean slightly forward. Try not to slouch.  Hold the incentive spirometer in an upright position. INSTRUCTIONS FOR USE   Sit on the edge of your bed if possible, or sit up as far as you can in bed or on a chair.  Hold the incentive spirometer in an upright position.  Breathe out normally.  Place the mouthpiece in your mouth and seal your lips tightly around it.  Breathe in slowly and as deeply as possible, raising the piston or the ball toward the top of the column.  Hold your breath for 3-5 seconds or for as long as possible. Allow the piston or ball to fall to the bottom of the column.  Remove the mouthpiece from your mouth and breathe out normally.  Rest for a few seconds and repeat Steps 1 through 7 at least 10 times every 1-2 hours when you are awake. Take your time and take a few normal breaths between deep breaths.  The spirometer may include an indicator to show  your best effort. Use the indicator as a goal to work toward during each repetition.  After each set of 10 deep breaths, practice coughing to be sure your lungs are clear. If you have an incision (the cut made at the time of surgery), support your incision when coughing by placing a pillow or rolled up towels firmly against it. Once you are able to get out of bed, walk around indoors and cough well. You may stop using the incentive spirometer when instructed by your caregiver.  RISKS AND COMPLICATIONS  Take your time so you do not get dizzy or  light-headed.  If you are in pain, you may need to take or ask for pain medication before doing incentive spirometry. It is harder to take a deep breath if you are having pain. AFTER USE  Rest and breathe slowly and easily.  It can be helpful to keep track of a log of your progress. Your caregiver can provide you with a simple table to help with this. If you are using the spirometer at home, follow these instructions: [place] IF:   You are having difficultly using the spirometer.  You have trouble using the spirometer as often as instructed.  Your pain medication is not giving enough relief while using the spirometer.  You develop fever of 100.5 F (38.1 C) or higher. SEEK IMMEDIATE MEDICAL CARE IF:   You cough up bloody sputum that had not been present before.  You develop fever of 102 F (38.9 C) or greater.  You develop worsening pain at or near the incision site. MAKE SURE YOU:   Understand these instructions.  Will watch your condition.  Will get help right away if you are not doing well or get worse. Document Released: 08/17/2006 Document Revised: 06/29/2011 Document Reviewed: 10/18/2006 ExitCare Patient Information 2014 Exit[employer], [place].   ________________________________________________________________________  WHAT IS A BLOOD TRANSFUSION? Blood Transfusion Information  A transfusion is the replacement of blood or some of its parts. Blood is made up of multiple cells which provide different functions.  Red blood cells carry oxygen and are used for blood loss replacement.  White blood cells fight against infection.  Platelets control bleeding.  Plasma helps clot blood.  Other blood products are available for specialized needs, such as hemophilia or other clotting disorders. BEFORE THE TRANSFUSION  Who gives blood for transfusions?   Healthy volunteers who are fully evaluated to make sure their blood is safe. This is blood bank  blood. Transfusion therapy is the safest it has ever been in the practice of medicine. Before blood is taken from a donor, a complete history is taken to make sure that person has no history of diseases nor engages in risky social behavior (examples are intravenous drug use or sexual activity with multiple partners). The donor's travel history is screened to minimize risk of transmitting infections, such as malaria. The donated blood is tested for signs of infectious diseases, such as HIV and hepatitis. The blood is then tested to be sure it is compatible with you in order to minimize the chance of a transfusion reaction. If you or a relative donates blood, this is often done in anticipation of surgery and is not appropriate for emergency situations. It takes many days to process the donated blood. RISKS AND COMPLICATIONS Although transfusion therapy is very safe and saves many lives, the main dangers of transfusion include:   Getting an infectious disease.  Developing a transfusion reaction. This is an allergic reaction to  something in the blood you were given. Every precaution is taken to prevent this. The decision to have a blood transfusion has been considered carefully by your caregiver before blood is given. Blood is not given unless the benefits outweigh the risks. AFTER THE TRANSFUSION  Right after receiving a blood transfusion, you will usually feel much better and more energetic. This is especially true if your red blood cells have gotten low (anemic). The transfusion raises the level of the red blood cells which carry oxygen, and this usually causes an energy increase.  The nurse administering the transfusion will monitor you carefully for complications. HOME CARE INSTRUCTIONS  No special instructions are needed after a transfusion. You may find your energy is better. Speak with your caregiver about any limitations on activity for underlying diseases you may have. SEEK MEDICAL CARE IF:    Your condition is not improving after your transfusion.  You develop redness or irritation at the intravenous (IV) site. SEEK IMMEDIATE MEDICAL CARE IF:  Any of the following symptoms occur over the next 12 hours:  Shaking chills.  You have a temperature by mouth above 102 F (38.9 C), not controlled by medicine.  Chest, back, or muscle pain.  People around you feel you are not acting correctly or are confused.  Shortness of breath or difficulty breathing.  Dizziness and fainting.  You get a rash or develop hives.  You have a decrease in urine output.  Your urine turns a dark color or changes to pink, red, or brown. Any of the following symptoms occur over the next 10 days:  You have a temperature by mouth above 102 F (38.9 C), not controlled by medicine.  Shortness of breath.  Weakness after normal activity.  The white part of the eye turns yellow (jaundice).  You have a decrease in the amount of urine or are urinating less often.  Your urine turns a dark color or changes to pink, red, or brown. Document Released: 04/03/2000 Document Revised: 06/29/2011 Document Reviewed: 11/21/2007 [facility] Patient Information 2014 [place], [place].  _______________________________________________________________________

## 2014-03-13 ENCOUNTER — Encounter (HOSPITAL_COMMUNITY)
Admission: RE | Admit: 2014-03-13 | Discharge: 2014-03-13 | Disposition: A | Discharge status: Home / Self Care | Payer: BC Managed Care – PPO | Source: Ambulatory Visit | Attending: Specialist | Admitting: Specialist

## 2014-03-13 ENCOUNTER — Encounter (INDEPENDENT_AMBULATORY_CARE_PROVIDER_SITE_OTHER): Payer: Self-pay

## 2014-03-13 ENCOUNTER — Encounter (HOSPITAL_COMMUNITY): Payer: Self-pay | Admitting: *Deleted

## 2014-03-13 DIAGNOSIS — Z01812 Encounter for preprocedural laboratory examination: Secondary | ICD-10-CM | POA: Insufficient documentation

## 2014-03-13 LAB — SURGICAL PCR SCREEN
MRSA, PCR: NEGATIVE
Staphylococcus aureus: NEGATIVE

## 2014-03-13 LAB — URINALYSIS, ROUTINE W REFLEX MICROSCOPIC
Bilirubin Urine: NEGATIVE
Glucose, UA: NEGATIVE mg/dL
Hgb urine dipstick: NEGATIVE
Ketones, ur: NEGATIVE mg/dL
Leukocytes, UA: NEGATIVE
Nitrite: NEGATIVE
Protein, ur: NEGATIVE mg/dL
Specific Gravity, Urine: 1.014 (ref 1.005–1.030)
Urobilinogen, UA: 0.2 mg/dL (ref 0.0–1.0)
pH: 5.5 (ref 5.0–8.0)

## 2014-03-13 LAB — CBC
HCT: 35.9 % — ABNORMAL LOW (ref 36.0–46.0)
Hemoglobin: 11.8 g/dL — ABNORMAL LOW (ref 12.0–15.0)
MCH: 25.9 pg — ABNORMAL LOW (ref 26.0–34.0)
MCHC: 32.9 g/dL (ref 30.0–36.0)
MCV: 78.7 fL (ref 78.0–100.0)
Platelets: 152 10*3/uL (ref 150–400)
RBC: 4.56 MIL/uL (ref 3.87–5.11)
RDW: 16 % — ABNORMAL HIGH (ref 11.5–15.5)
WBC: 3.5 10*3/uL — ABNORMAL LOW (ref 4.0–10.5)

## 2014-03-13 LAB — BASIC METABOLIC PANEL
Anion gap: 12 (ref 5–15)
BUN: 16 mg/dL (ref 6–23)
CO2: 23 mEq/L (ref 19–32)
Calcium: 9.5 mg/dL (ref 8.4–10.5)
Chloride: 108 mEq/L (ref 96–112)
Creatinine, Ser: 1 mg/dL (ref 0.50–1.10)
GFR calc Af Amer: 72 mL/min — ABNORMAL LOW (ref >90)
GFR calc non Af Amer: 62 mL/min — ABNORMAL LOW (ref >90)
Glucose, Bld: 104 mg/dL — ABNORMAL HIGH (ref 70–99)
Potassium: 3.7 mEq/L (ref 3.7–5.3)
Sodium: 143 mEq/L (ref 137–147)

## 2014-03-13 LAB — PROTIME-INR
INR: 1.46 (ref 0.00–1.49)
Prothrombin Time: 17.9 seconds — ABNORMAL HIGH (ref 11.6–15.2)

## 2014-03-13 LAB — APTT: aPTT: 39 seconds — ABNORMAL HIGH (ref 24–37)

## 2014-03-13 NOTE — Progress Notes (Signed)
PTT results doen 03/13/2014 faxed via EPIC to Dr Tonita Cong.

## 2014-03-13 NOTE — Progress Notes (Signed)
CBC and PT results done 03/13/14 faxed via EPIC to Dr Tonita Cong.

## 2014-03-19 ENCOUNTER — Ambulatory Visit: Payer: Self-pay | Admitting: Orthopedic Surgery

## 2014-03-19 NOTE — H&P (Signed)
[name] DOB: 1960-05-29 Single / Language: Cleophus Molt / Race: Black or African American Female  H&P Date: 03/13/14  Chief Complaint: Right knee pain  History of Present Illness The patient is a 53 year old female who comes in today for a preoperative History and Physical. The patient is scheduled for a right total knee arthroplasty to be performed by Dr. Johnn Hai, MD at [facility] on 03/22/14. [name] has had pain in the knee for years, refractory to steroid injections, viscosupplementation, bracing, activity modifications, relative rest, PT/HEP, weight loss efforts, pain medications. Her continued pain is interfering with ADL's at this point and she desires to proceed with surgery.  Dr. Tonita Cong and the patient mutually agreed to proceed with a total knee replacement. Risks and benefits of the procedure were discussed including stiffness, suboptimal range of motion, persistent pain, infection requiring removal of prosthesis and reinsertion, need for prophylactic antibiotics in the future, for example, dental procedures, possible need for manipulation, revision in the future and also anesthetic complications including DVT, PE, etc. We discussed the perioperative course, time in the hospital, postoperative recovery and the need for elevation to control swelling. We also discussed the predicted range of motion and the probability that squatting and kneeling would be unobtainable in the future. In addition, postoperative anticoagulation was discussed. We have obtained preoperative medical clearance as necessary. Provided her illustrated handout and discussed it in detail. They will enroll in the total joint replacement educational forum at the hospital.  She is on xarelto 20mg  daily for prior DVT, has been instructed to hold 5 days prior. Has also been instructed to hold supplements, vitamins accordingly and her celebrex. She is s/p recent lap band ontop of her prior gastric bypass, has  been told she cannot swallow any pills larger than the circumference of a dime due to the lap band and that she may need elixir. After her left total knee she went to inpatient rehab and did well there, took oxycontin and percocet for pain.  Allergies  Sulfanomides red eyes, hives, welts Thimerosal *ANTISEPTICS & DISINFECTANTS* hives, itching, swelling Penicillins hives, swelling, itching BETA BLOCKERS06/28/2006 red eyes, congestion  Family History Drug / Alcohol Addiction father, grandmother fathers side and grandfather fathers side Osteoarthritis mother and father Diabetes Mellitus mother, father and brother Cerebrovascular Accident mother, brother and grandmother mothers side First Degree Relatives  Social History  Tobacco use Never smoker. never smoker Drug/Alcohol Rehab (Currently) no Tobacco / smoke exposure no Pain Contract no Exercise Exercises weekly; does other Marital status single Illicit drug use no Living situation live alone, one story house, one step to enter Number of flights of stairs before winded 2-3 Children 0 Alcohol use former drinker Current work status working full time Drug/Alcohol Rehab (Previously) no Post-Surgical Plans inpatient [facility]  Medication History Qvar (80MCG/ACT Aerosol Soln, Inhalation) Active. Montelukast Sodium (10MG  Tablet, Oral) Active. DHA Complete (200MG  Capsule, Oral) Active. Nasonex (Nasal) Specific dose unknown - Active. Xarelto (20MG  Tablet, Oral) Active. Alphagan P (0.1% Solution, Ophthalmic) Active. ALPRAZolam (0.5MG  Tablet, Oral) Active. Azopt (1% Suspension, Ophthalmic) Active. Cyclobenzaprine HCl (10MG  Tablet, Oral) Active. Diltiazem HCl Coated Beads (240MG  Capsule ER 24HR, Oral) Active. Diovan HCT (80-12.5MG  Tablet, Oral) Active. Lumigan (0.01% Solution, Ophthalmic) Active. NexIUM (40MG  Capsule DR, Oral) Active. Potassium Chloride Crys CR (20MEQ Tablet ER, Oral)  Active. Qualaquin (324MG  Capsule, Oral) Active. CeleBREX (200MG  Capsule, Oral) Active. Pataday (0.2% Solution, Ophthalmic as needed) Active. Medications Reconciled  Past Surgical History Cataract Surgery right Arthroscopy  of Knee left hysteroscopy and D&C 2000 Filter 2010 Gastric bypass 2010 Hysteroscopy 2010 G2X filter removed 2010 Left eye laser surgery 2011 Open R eye glaucoma surgery 2011 Post Bleak: Flat anterior chamber 2011 cataract surgery 2011 IV filter placement 2012 ACDF C3-4, 4-5 2012 L TKA 2013 L eye laser surgery 2014 Avastin eye injections R eye 2014 Hiatal Hernia/Lap band 2015  Past Medical Hx Asthma Allergic Urticaria Depression Blood Clot Diabetes Mellitus, Type II Migraine Headache Anxiety Disorder Sleep Apnea on CPAP Osteoarthritis High blood pressure Gastroesophageal Reflux Disease Glaucoma  Review of Systems General Not Present- Chills, Fatigue, Fever, Memory Loss, Night Sweats, Weight Gain and Weight Loss. Skin Not Present- Eczema, Hives, Itching, Lesions and Rash. HEENT Not Present- Dentures, Double Vision, Headache, Hearing Loss, Tinnitus and Visual Loss. Respiratory Not Present- Allergies, Chronic Cough, Coughing up blood, Shortness of breath at rest and Shortness of breath with exertion. Note: sinus congestion Cardiovascular Not Present- Chest Pain, Difficulty Breathing Lying Down, Murmur, Palpitations, Racing/skipping heartbeats and Swelling. Gastrointestinal Not Present- Abdominal Pain, Bloody Stool, Constipation, Diarrhea, Difficulty Swallowing, Heartburn, Jaundice, Loss of appetitie, Nausea and Vomiting. Female Genitourinary Not Present- Blood in Urine, Discharge, Flank Pain, Incontinence, Painful Urination, Urgency, Urinary frequency, Urinary Retention, Urinating at Night and Weak urinary stream. Musculoskeletal Present- Joint Pain, Joint Stiffness, Joint Swelling and Morning Stiffness. Not Present- Back Pain, Muscle  Pain, Muscle Weakness and Spasms. Neurological Not Present- Blackout spells, Difficulty with balance, Dizziness, Paralysis, Tremor and Weakness. Psychiatric Not Present- Insomnia.  Physical Exam  General Mental Status -Alert, cooperative and good historian. General Appearance-pleasant, Not in acute distress. Orientation-Oriented X3. Build & Nutrition-Well nourished and Well developed. Gait-Stiff and Antalgic.  Head and Neck Head-normocephalic, atraumatic . Neck Global Assessment - supple, no bruit auscultated on the right, no bruit auscultated on the left.  Eye Pupil - Bilateral-Regular and Round. Motion - Bilateral-EOMI.  Chest and Lung Exam Auscultation Breath sounds - clear at anterior chest wall and clear at posterior chest wall. Adventitious sounds - No Adventitious sounds.  Cardiovascular Auscultation Rhythm - Regular rate and rhythm. Heart Sounds - S1 WNL and S2 WNL. Murmurs & Other Heart Sounds - Auscultation of the heart reveals - No Murmurs.  Abdomen Palpation/Percussion Tenderness - Abdomen is non-tender to palpation. Rigidity (guarding) - Abdomen is soft. Auscultation Auscultation of the abdomen reveals - Bowel sounds normal.  Female Genitourinary Note: Not done, not pertinent to present illness  Musculoskeletal Note: Right Knee: Inspection and Palpation - Tenderness - medial joint line tender to palpation and lateral joint line tender to palpation, no tenderness to palpation of the superior calf, no tenderness to palpation of the quadriceps tendon, no tenderness to palpation of the patellar tendon, no tenderness to palpation of the fibular head, no tenderness to palpation of the peroneal nerve. Effusion - trace. Tissue tension/texture is - soft. Crepitus - moderate patellofemoral crepitus. Pulses - 2+. Sensation - intact to light touch. Skin - Color - no ecchymosis, no erythema. Strength and Tone - Quadriceps - 5/5. Hamstrings - 5/5. ROM:  Flexion - AROM - 95 . Extension - AROM - 0 . Stability - Valgus Laxity at 30 - None. Valgus Laxity at 0 - None. Varus Laxity at 30 - None. Varus Laxity at 0 - None. Lachman - Negative. Anterior Drawer Test - Negative. Posterior Drawer Test - Negative. Right Knee - Deformities/Malalignments/Discrepancies - no deformities noted. Special Tests - McMurray Test (lateral) - negative. McMurray Test (medial) - negative. Patellar Compression Pain - mild pain.  Imaging  Prior xrays reviewed with end-stage osteoarthritis, bone-on-bone changes.  Assessment & Plan  Primary osteoarthritis of knee, right  Pt with end-stage R knee DJD, bone-on-bone, refractory to conservative tx, scheduled for right total knee replacement by Dr. Tonita Cong on 03/22/14. We again discussed the procedure itself as well as risks, complications and alternatives, including but not limited to DVT, PE, infx, bleeding, failure of procedure, need for secondary procedure including manipulation, nerve injury, ongoing pain/symptoms, anesthesia risk, even stroke or death. Also discussed typical post-op protocols, activity restrictions, need for PT, flexion/extension exercises, time out of work. Discussed need for DVT ppx post-op with Xarelto then ASA per protocol. Discussed dental ppx. Also discussed limitations post-operatively such as kneeling and squatting. All questions were answered. Patient desires to proceed with surgery as scheduled. Will hold Xarelto, supplements, vitamins, and NSAIDs accordingly, discussed in detail. Will remain NPO after MN night before surgery. She had her [facility] pre-op testing appt this morning. Her potassium tends to run low, will plan an IV with potassium post-op to help prevent hypokalemia. Plan vanco peri-operatively for abx ppx given her swelling allergy with cephalosporins. Plan to resume Xarelto post-op accordingly for DVT ppx. Plan Percocet or Dilaudid (may require elixir depending on size of pills), Robaxin, Colace.  Plan inpatient [facility] rehab, which she has already discussed with her PCP, insurance company, and the rehab unit, she did well with that last time. Will follow up 10-14 days post-op for suture removal and xrays. She will call with any questions or concerns in the interim.  Plan right total knee replacement  Signed electronically by Lacie Draft, PA-C for Dr. Tonita Cong

## 2014-03-20 ENCOUNTER — Encounter: Payer: BC Managed Care – PPO | Attending: Surgery | Admitting: Dietician

## 2014-03-20 DIAGNOSIS — Z6841 Body Mass Index (BMI) 40.0 and over, adult: Secondary | ICD-10-CM | POA: Insufficient documentation

## 2014-03-20 NOTE — Patient Instructions (Signed)
-  Limit shakes to 1 per day -Increase exercise as tolerated 

## 2014-03-20 NOTE — Progress Notes (Signed)
  Follow-up visit:  8 Weeks Post-Operative LAGB over RYGB Surgery  Medical Nutrition Therapy:  Appt start time: 0910 end time:  0930.  Primary concerns today: Post-operative Bariatric Surgery Nutrition Management.  [name] returns having lost 4.5 lbs. She has not yet had a band fill due to a healing hernia. She reports that her bowel movements are not regular but she has not had to take anything for it recently. She has a knee replacement scheduled this week.  Having 2 protein shakes per day.  Surgery date: 01/30/14  Surgery type: LAGB (banding RYGB pouch)  Start weight at [facility]: 290 lbs on 02/08/14  Weight today: 268.1 lbs Weight change: 4.5 lbs  Total weight lost: 22 lbs   Preferred Learning Style:   No preference indicated   Learning Readiness:   Ready   24-hr recall: B (AM): boiled egg and [place] sausage (12g) Snk (AM): 4 ounces protein shake (10g) L (PM): chicken or tuna salad OR baked wings with green beans (21g) Snk (PM): rest of protein shake or yogurt (12-15 g) D (PM): 3 oz meat and vegetable (21g) Snk (PM): protein shake and sugar free popsicle (30g)  Fluid intake: water and protein shakes (72 oz + per day) Estimated total protein intake: 75-100 g per day  Medications: see list Supplementation: stopped due to pending knee surgery  Drinking while eating: no Hair loss: no Carbonated beverages: no N/V/D/C: nausea and irregular bowel movements Dumping syndrome: none Last Lap-Band fill: none (due to hernia per patient)  Recent physical activity:  Chair exercises; walking at work  Progress Towards Goal(s):  In progress.  Handouts given during visit include:  Phase 3B lean protein + non-starchy vegetables   Nutritional Diagnosis:  Industry-3.3 Overweight/obesity related to past poor dietary habits and physical inactivity as evidenced by patient w/ recent LAGB surgery following dietary guidelines for continued weight loss.     Intervention:  Nutrition  counseling provided. Goals: Limit protein shakes to 1 per day and increase exercise following knee replacement per surgeon's approval.   Teaching Method Utilized:  Visual Auditory  Barriers to learning/adherence to lifestyle change: pending knee surgery  Demonstrated degree of understanding via:  Teach Back   Monitoring/Evaluation:  Dietary intake, exercise, lap band fills, and body weight. Follow up in 2 months for 4 month post-op visit.

## 2014-03-21 MED ORDER — VANCOMYCIN HCL 10 G IV SOLR
1500.0000 mg | INTRAVENOUS | Status: AC
  Administered 2014-03-22: 1500 mg via INTRAVENOUS
  Filled 2014-03-21: qty 1500

## 2014-03-22 ENCOUNTER — Inpatient Hospital Stay (HOSPITAL_COMMUNITY): Payer: BC Managed Care – PPO | Admitting: Anesthesiology

## 2014-03-22 ENCOUNTER — Encounter (HOSPITAL_COMMUNITY)
Admission: RE | Disposition: A | Discharge status: Home Health | Payer: Self-pay | Source: Ambulatory Visit | Attending: Specialist

## 2014-03-22 ENCOUNTER — Inpatient Hospital Stay (HOSPITAL_COMMUNITY): Payer: BC Managed Care – PPO

## 2014-03-22 ENCOUNTER — Encounter (HOSPITAL_COMMUNITY): Payer: Self-pay

## 2014-03-22 ENCOUNTER — Inpatient Hospital Stay (HOSPITAL_COMMUNITY)
Admission: RE | Admit: 2014-03-22 | Discharge: 2014-03-26 | DRG: 470 | Disposition: A | Discharge status: Home Health | Payer: BC Managed Care – PPO | Source: Ambulatory Visit | Attending: Specialist | Admitting: Specialist

## 2014-03-22 DIAGNOSIS — Z79899 Other long term (current) drug therapy: Secondary | ICD-10-CM

## 2014-03-22 DIAGNOSIS — Z9884 Bariatric surgery status: Secondary | ICD-10-CM

## 2014-03-22 DIAGNOSIS — Z86718 Personal history of other venous thrombosis and embolism: Secondary | ICD-10-CM

## 2014-03-22 DIAGNOSIS — K219 Gastro-esophageal reflux disease without esophagitis: Secondary | ICD-10-CM | POA: Diagnosis present

## 2014-03-22 DIAGNOSIS — M1990 Unspecified osteoarthritis, unspecified site: Secondary | ICD-10-CM

## 2014-03-22 DIAGNOSIS — I10 Essential (primary) hypertension: Secondary | ICD-10-CM | POA: Diagnosis present

## 2014-03-22 DIAGNOSIS — G4733 Obstructive sleep apnea (adult) (pediatric): Secondary | ICD-10-CM | POA: Diagnosis present

## 2014-03-22 DIAGNOSIS — F329 Major depressive disorder, single episode, unspecified: Secondary | ICD-10-CM | POA: Diagnosis present

## 2014-03-22 DIAGNOSIS — Z6841 Body Mass Index (BMI) 40.0 and over, adult: Secondary | ICD-10-CM | POA: Diagnosis not present

## 2014-03-22 DIAGNOSIS — Z79891 Long term (current) use of opiate analgesic: Secondary | ICD-10-CM

## 2014-03-22 DIAGNOSIS — D62 Acute posthemorrhagic anemia: Secondary | ICD-10-CM | POA: Diagnosis not present

## 2014-03-22 DIAGNOSIS — Z96651 Presence of right artificial knee joint: Secondary | ICD-10-CM

## 2014-03-22 DIAGNOSIS — Z833 Family history of diabetes mellitus: Secondary | ICD-10-CM

## 2014-03-22 DIAGNOSIS — Z823 Family history of stroke: Secondary | ICD-10-CM | POA: Diagnosis not present

## 2014-03-22 DIAGNOSIS — E119 Type 2 diabetes mellitus without complications: Secondary | ICD-10-CM | POA: Diagnosis present

## 2014-03-22 DIAGNOSIS — Z7901 Long term (current) use of anticoagulants: Secondary | ICD-10-CM | POA: Diagnosis not present

## 2014-03-22 DIAGNOSIS — F419 Anxiety disorder, unspecified: Secondary | ICD-10-CM | POA: Diagnosis present

## 2014-03-22 DIAGNOSIS — M1711 Unilateral primary osteoarthritis, right knee: Principal | ICD-10-CM

## 2014-03-22 DIAGNOSIS — J45909 Unspecified asthma, uncomplicated: Secondary | ICD-10-CM | POA: Diagnosis present

## 2014-03-22 DIAGNOSIS — Z86711 Personal history of pulmonary embolism: Secondary | ICD-10-CM | POA: Diagnosis not present

## 2014-03-22 DIAGNOSIS — M25561 Pain in right knee: Secondary | ICD-10-CM | POA: Diagnosis present

## 2014-03-22 DIAGNOSIS — H409 Unspecified glaucoma: Secondary | ICD-10-CM | POA: Diagnosis present

## 2014-03-22 DIAGNOSIS — Z8249 Family history of ischemic heart disease and other diseases of the circulatory system: Secondary | ICD-10-CM

## 2014-03-22 HISTORY — PX: TOTAL KNEE ARTHROPLASTY: SHX125

## 2014-03-22 LAB — TYPE AND SCREEN
ABO/RH(D): O POS
Antibody Screen: NEGATIVE

## 2014-03-22 SURGERY — ARTHROPLASTY, KNEE, TOTAL
Anesthesia: General | Site: Knee | Laterality: Right

## 2014-03-22 MED ORDER — MIDAZOLAM HCL 2 MG/2ML IJ SOLN
INTRAMUSCULAR | Status: AC
  Filled 2014-03-22: qty 2

## 2014-03-22 MED ORDER — RIVAROXABAN 10 MG PO TABS
10.0000 mg | ORAL_TABLET | Freq: Every day | ORAL | Status: DC
  Administered 2014-03-23 – 2014-03-26 (×4): 10 mg via ORAL
  Filled 2014-03-22 (×6): qty 1

## 2014-03-22 MED ORDER — MAGNESIUM CHLORIDE 64 MG PO TBEC
1.0000 | DELAYED_RELEASE_TABLET | Freq: Every day | ORAL | Status: DC
  Administered 2014-03-22 – 2014-03-26 (×5): 64 mg via ORAL
  Filled 2014-03-22 (×5): qty 1

## 2014-03-22 MED ORDER — ALBUTEROL SULFATE (2.5 MG/3ML) 0.083% IN NEBU: 2.5000 mg | INHALATION_SOLUTION | Freq: Four times a day (QID) | RESPIRATORY_TRACT | Status: DC | PRN

## 2014-03-22 MED ORDER — DOCUSATE SODIUM 100 MG PO CAPS: 100.0000 mg | ORAL_CAPSULE | Freq: Two times a day (BID) | ORAL | Status: DC | PRN

## 2014-03-22 MED ORDER — CICLESONIDE 160 MCG/ACT IN AERS
2.0000 | INHALATION_SPRAY | Freq: Two times a day (BID) | RESPIRATORY_TRACT | Status: DC
  Administered 2014-03-23: 2 via RESPIRATORY_TRACT

## 2014-03-22 MED ORDER — FLUTICASONE PROPIONATE HFA 44 MCG/ACT IN AERO
1.0000 | INHALATION_SPRAY | Freq: Two times a day (BID) | RESPIRATORY_TRACT | Status: DC
  Filled 2014-03-22: qty 10.6

## 2014-03-22 MED ORDER — HYDROMORPHONE HCL 1 MG/ML IJ SOLN
INTRAMUSCULAR | Status: DC | PRN
  Administered 2014-03-22 (×4): 0.5 mg via INTRAVENOUS

## 2014-03-22 MED ORDER — POTASSIUM CHLORIDE CRYS ER 20 MEQ PO TBCR
20.0000 meq | EXTENDED_RELEASE_TABLET | Freq: Every day | ORAL | Status: DC
  Administered 2014-03-22 – 2014-03-26 (×5): 20 meq via ORAL
  Filled 2014-03-22 (×5): qty 1

## 2014-03-22 MED ORDER — ONDANSETRON HCL 4 MG/2ML IJ SOLN
INTRAMUSCULAR | Status: AC
  Filled 2014-03-22: qty 2

## 2014-03-22 MED ORDER — PROPOFOL 10 MG/ML IV BOLUS
INTRAVENOUS | Status: DC | PRN
  Administered 2014-03-22: 200 mg via INTRAVENOUS

## 2014-03-22 MED ORDER — METOCLOPRAMIDE HCL 5 MG/ML IJ SOLN
INTRAMUSCULAR | Status: DC | PRN
  Administered 2014-03-22: 10 mg via INTRAVENOUS

## 2014-03-22 MED ORDER — BUPIVACAINE-EPINEPHRINE 0.25% -1:200000 IJ SOLN
INTRAMUSCULAR | Status: DC | PRN
  Administered 2014-03-22: 40 mL

## 2014-03-22 MED ORDER — METHOCARBAMOL 500 MG PO TABS: 500.0000 mg | ORAL_TABLET | Freq: Three times a day (TID) | ORAL | Status: DC

## 2014-03-22 MED ORDER — HYDROMORPHONE HCL 1 MG/ML IJ SOLN
1.0000 mg | INTRAMUSCULAR | Status: DC | PRN
  Administered 2014-03-22 – 2014-03-23 (×4): 1 mg via INTRAVENOUS
  Filled 2014-03-22 (×4): qty 1

## 2014-03-22 MED ORDER — VITAMIN C 500 MG PO TABS
1000.0000 mg | ORAL_TABLET | Freq: Every day | ORAL | Status: DC
  Administered 2014-03-23 – 2014-03-26 (×4): 1000 mg via ORAL
  Filled 2014-03-22 (×4): qty 2

## 2014-03-22 MED ORDER — LATANOPROST 0.005 % OP SOLN
1.0000 [drp] | Freq: Every day | OPHTHALMIC | Status: DC
  Administered 2014-03-23: 1 [drp] via OPHTHALMIC
  Filled 2014-03-22: qty 2.5

## 2014-03-22 MED ORDER — ADULT MULTIVITAMIN W/MINERALS CH
1.0000 | ORAL_TABLET | Freq: Two times a day (BID) | ORAL | Status: DC
  Administered 2014-03-22 – 2014-03-26 (×7): 1 via ORAL
  Filled 2014-03-22 (×11): qty 1

## 2014-03-22 MED ORDER — MENTHOL 3 MG MT LOZG: 1.0000 | LOZENGE | OROMUCOSAL | Status: DC | PRN

## 2014-03-22 MED ORDER — VALSARTAN-HYDROCHLOROTHIAZIDE 80-12.5 MG PO TABS: 1.0000 | ORAL_TABLET | ORAL | Status: DC

## 2014-03-22 MED ORDER — ONDANSETRON HCL 4 MG/2ML IJ SOLN
INTRAMUSCULAR | Status: DC | PRN
  Administered 2014-03-22: 4 mg via INTRAVENOUS

## 2014-03-22 MED ORDER — HYDROMORPHONE HCL 1 MG/ML IJ SOLN
0.2500 mg | INTRAMUSCULAR | Status: DC | PRN
  Administered 2014-03-22 (×6): 0.25 mg via INTRAVENOUS

## 2014-03-22 MED ORDER — POTASSIUM CHLORIDE CRYS ER 20 MEQ PO TBCR
40.0000 meq | EXTENDED_RELEASE_TABLET | Freq: Every day | ORAL | Status: DC
  Administered 2014-03-23 – 2014-03-26 (×4): 40 meq via ORAL
  Filled 2014-03-22 (×7): qty 2

## 2014-03-22 MED ORDER — IRBESARTAN 75 MG PO TABS
75.0000 mg | ORAL_TABLET | Freq: Every day | ORAL | Status: DC
  Administered 2014-03-23 – 2014-03-26 (×4): 75 mg via ORAL
  Filled 2014-03-22 (×4): qty 1

## 2014-03-22 MED ORDER — SODIUM CHLORIDE 0.9 % IR SOLN
Status: AC
  Filled 2014-03-22: qty 1

## 2014-03-22 MED ORDER — SODIUM CHLORIDE 0.9 % IR SOLN
Status: DC | PRN
  Administered 2014-03-22: 12:00:00

## 2014-03-22 MED ORDER — HYDROMORPHONE HCL 1 MG/ML IJ SOLN
INTRAMUSCULAR | Status: AC
  Filled 2014-03-22: qty 1

## 2014-03-22 MED ORDER — HYDROCHLOROTHIAZIDE 12.5 MG PO CAPS
12.5000 mg | ORAL_CAPSULE | Freq: Every day | ORAL | Status: DC
  Administered 2014-03-23 – 2014-03-26 (×4): 12.5 mg via ORAL
  Filled 2014-03-22 (×4): qty 1

## 2014-03-22 MED ORDER — PHENYLEPHRINE 40 MCG/ML (10ML) SYRINGE FOR IV PUSH (FOR BLOOD PRESSURE SUPPORT)
PREFILLED_SYRINGE | INTRAVENOUS | Status: AC
  Filled 2014-03-22: qty 10

## 2014-03-22 MED ORDER — SALINE SPRAY 0.65 % NA SOLN
2.0000 | Freq: Two times a day (BID) | NASAL | Status: DC
  Administered 2014-03-22 – 2014-03-26 (×6): 2 via NASAL
  Filled 2014-03-22: qty 44

## 2014-03-22 MED ORDER — LACTATED RINGERS IV SOLN
INTRAVENOUS | Status: DC
  Administered 2014-03-22 (×3): via INTRAVENOUS

## 2014-03-22 MED ORDER — KCL IN DEXTROSE-NACL 20-5-0.45 MEQ/L-%-% IV SOLN
INTRAVENOUS | Status: AC
  Administered 2014-03-22: 17:00:00 via INTRAVENOUS
  Filled 2014-03-22 (×2): qty 1000

## 2014-03-22 MED ORDER — OXYCODONE HCL 5 MG PO TABS: ORAL_TABLET | ORAL | Status: DC

## 2014-03-22 MED ORDER — ACETAMINOPHEN 650 MG RE SUPP: 650.0000 mg | Freq: Four times a day (QID) | RECTAL | Status: DC | PRN

## 2014-03-22 MED ORDER — ONDANSETRON HCL 4 MG/2ML IJ SOLN: 4.0000 mg | Freq: Four times a day (QID) | INTRAMUSCULAR | Status: DC | PRN

## 2014-03-22 MED ORDER — ALPRAZOLAM 0.25 MG PO TABS: 0.2500 mg | ORAL_TABLET | Freq: Every evening | ORAL | Status: DC | PRN

## 2014-03-22 MED ORDER — PROPOFOL 10 MG/ML IV BOLUS
INTRAVENOUS | Status: AC
  Filled 2014-03-22: qty 20

## 2014-03-22 MED ORDER — SENNOSIDES-DOCUSATE SODIUM 8.6-50 MG PO TABS
1.0000 | ORAL_TABLET | Freq: Every evening | ORAL | Status: DC | PRN
  Administered 2014-03-25: 1 via ORAL
  Filled 2014-03-22: qty 1

## 2014-03-22 MED ORDER — SODIUM CHLORIDE 0.9 % IJ SOLN
INTRAMUSCULAR | Status: AC
  Filled 2014-03-22: qty 10

## 2014-03-22 MED ORDER — ONDANSETRON HCL 4 MG PO TABS: 4.0000 mg | ORAL_TABLET | Freq: Four times a day (QID) | ORAL | Status: DC | PRN

## 2014-03-22 MED ORDER — FENTANYL CITRATE 0.05 MG/ML IJ SOLN
INTRAMUSCULAR | Status: AC
  Filled 2014-03-22: qty 5

## 2014-03-22 MED ORDER — METOCLOPRAMIDE HCL 5 MG/ML IJ SOLN: 5.0000 mg | Freq: Three times a day (TID) | INTRAMUSCULAR | Status: DC | PRN

## 2014-03-22 MED ORDER — METOCLOPRAMIDE HCL 10 MG PO TABS: 5.0000 mg | ORAL_TABLET | Freq: Three times a day (TID) | ORAL | Status: DC | PRN

## 2014-03-22 MED ORDER — MAGNESIUM CHLORIDE 64 MG PO TBEC
2.0000 | DELAYED_RELEASE_TABLET | Freq: Every day | ORAL | Status: DC
  Administered 2014-03-23 – 2014-03-26 (×4): 128 mg via ORAL
  Filled 2014-03-22 (×4): qty 2

## 2014-03-22 MED ORDER — PHENYLEPHRINE HCL 10 MG/ML IJ SOLN
INTRAMUSCULAR | Status: DC | PRN
  Administered 2014-03-22: 120 ug via INTRAVENOUS
  Administered 2014-03-22: 80 ug via INTRAVENOUS

## 2014-03-22 MED ORDER — DHA 200 MG PO CAPS: ORAL_CAPSULE | Freq: Two times a day (BID) | ORAL | Status: DC

## 2014-03-22 MED ORDER — B COMPLEX PO TABS: 1.0000 | ORAL_TABLET | ORAL | Status: DC

## 2014-03-22 MED ORDER — NIACIN 500 MG PO TABS
500.0000 mg | ORAL_TABLET | Freq: Every day | ORAL | Status: DC
  Administered 2014-03-23 – 2014-03-26 (×4): 500 mg via ORAL
  Filled 2014-03-22 (×6): qty 1

## 2014-03-22 MED ORDER — TRAMADOL HCL 50 MG PO TABS: 50.0000 mg | ORAL_TABLET | Freq: Four times a day (QID) | ORAL | Status: DC | PRN

## 2014-03-22 MED ORDER — MAGNESIUM CITRATE PO SOLN: 1.0000 | Freq: Once | ORAL | Status: AC | PRN

## 2014-03-22 MED ORDER — DILTIAZEM HCL ER 240 MG PO CP24
240.0000 mg | ORAL_CAPSULE | Freq: Every day | ORAL | Status: DC
  Administered 2014-03-22 – 2014-03-25 (×4): 240 mg via ORAL
  Filled 2014-03-22 (×5): qty 1

## 2014-03-22 MED ORDER — POLYSACCHARIDE IRON COMPLEX 150 MG PO CAPS
150.0000 mg | ORAL_CAPSULE | Freq: Two times a day (BID) | ORAL | Status: DC
  Administered 2014-03-22 – 2014-03-26 (×8): 150 mg via ORAL
  Filled 2014-03-22 (×10): qty 1

## 2014-03-22 MED ORDER — SUCCINYLCHOLINE CHLORIDE 20 MG/ML IJ SOLN
INTRAMUSCULAR | Status: DC | PRN
  Administered 2014-03-22: 100 mg via INTRAVENOUS

## 2014-03-22 MED ORDER — MONTELUKAST SODIUM 10 MG PO TABS
10.0000 mg | ORAL_TABLET | Freq: Every day | ORAL | Status: DC
  Administered 2014-03-22 – 2014-03-25 (×4): 10 mg via ORAL
  Filled 2014-03-22 (×5): qty 1

## 2014-03-22 MED ORDER — BUPIVACAINE-EPINEPHRINE (PF) 0.25% -1:200000 IJ SOLN
INTRAMUSCULAR | Status: AC
  Filled 2014-03-22: qty 60

## 2014-03-22 MED ORDER — METHOCARBAMOL 500 MG PO TABS
500.0000 mg | ORAL_TABLET | Freq: Four times a day (QID) | ORAL | Status: DC | PRN
  Administered 2014-03-22 – 2014-03-26 (×8): 500 mg via ORAL
  Filled 2014-03-22 (×9): qty 1

## 2014-03-22 MED ORDER — SODIUM CHLORIDE 0.9 % IR SOLN
Status: DC | PRN
  Administered 2014-03-22: 3000 mL

## 2014-03-22 MED ORDER — VITAMIN B-12 2500 MCG SL SUBL: 2.0000 | SUBLINGUAL_TABLET | Freq: Every day | SUBLINGUAL | Status: DC

## 2014-03-22 MED ORDER — BUDESONIDE-FORMOTEROL FUMARATE 160-4.5 MCG/ACT IN AERO
2.0000 | INHALATION_SPRAY | Freq: Two times a day (BID) | RESPIRATORY_TRACT | Status: DC
  Filled 2014-03-22: qty 6

## 2014-03-22 MED ORDER — LACTATED RINGERS IV SOLN: INTRAVENOUS | Status: DC

## 2014-03-22 MED ORDER — OXYCODONE HCL 5 MG PO TABS
5.0000 mg | ORAL_TABLET | ORAL | Status: DC | PRN
  Administered 2014-03-22 (×2): 5 mg via ORAL
  Administered 2014-03-23 (×3): 10 mg via ORAL
  Filled 2014-03-22 (×3): qty 2
  Filled 2014-03-22 (×2): qty 1
  Filled 2014-03-22: qty 2
  Filled 2014-03-22: qty 1
  Filled 2014-03-22: qty 2

## 2014-03-22 MED ORDER — QUININE SULFATE 324 MG PO CAPS: 324.0000 mg | ORAL_CAPSULE | Freq: Every day | ORAL | Status: DC

## 2014-03-22 MED ORDER — DIPHENHYDRAMINE HCL 12.5 MG/5ML PO ELIX
12.5000 mg | ORAL_SOLUTION | ORAL | Status: DC | PRN
  Administered 2014-03-22: 25 mg via ORAL
  Administered 2014-03-22 – 2014-03-23 (×2): 12.5 mg via ORAL
  Administered 2014-03-23: 25 mg via ORAL
  Administered 2014-03-24: 12.5 mg via ORAL
  Filled 2014-03-22: qty 5
  Filled 2014-03-22 (×2): qty 10
  Filled 2014-03-22: qty 5
  Filled 2014-03-22: qty 10
  Filled 2014-03-22: qty 5

## 2014-03-22 MED ORDER — PHENOL 1.4 % MT LIQD: 1.0000 | OROMUCOSAL | Status: DC | PRN

## 2014-03-22 MED ORDER — FENTANYL CITRATE 0.05 MG/ML IJ SOLN
INTRAMUSCULAR | Status: DC | PRN
  Administered 2014-03-22 (×3): 50 ug via INTRAVENOUS
  Administered 2014-03-22: 100 ug via INTRAVENOUS

## 2014-03-22 MED ORDER — MIDAZOLAM HCL 5 MG/5ML IJ SOLN
INTRAMUSCULAR | Status: DC | PRN
  Administered 2014-03-22: 2 mg via INTRAVENOUS

## 2014-03-22 MED ORDER — DOCUSATE SODIUM 100 MG PO CAPS
100.0000 mg | ORAL_CAPSULE | Freq: Two times a day (BID) | ORAL | Status: DC
  Administered 2014-03-22 – 2014-03-26 (×8): 100 mg via ORAL

## 2014-03-22 MED ORDER — ACETAMINOPHEN 325 MG PO TABS
650.0000 mg | ORAL_TABLET | Freq: Four times a day (QID) | ORAL | Status: DC | PRN
  Administered 2014-03-23: 650 mg via ORAL
  Filled 2014-03-22: qty 2

## 2014-03-22 MED ORDER — FENTANYL CITRATE 0.05 MG/ML IJ SOLN
INTRAMUSCULAR | Status: AC
  Filled 2014-03-22: qty 2

## 2014-03-22 MED ORDER — LORATADINE 10 MG PO TABS
10.0000 mg | ORAL_TABLET | Freq: Every day | ORAL | Status: DC
  Administered 2014-03-22 – 2014-03-25 (×4): 10 mg via ORAL
  Filled 2014-03-22 (×5): qty 1

## 2014-03-22 MED ORDER — LORATADINE-PSEUDOEPHEDRINE ER 5-120 MG PO TB12: 1.0000 | ORAL_TABLET | Freq: Two times a day (BID) | ORAL | Status: DC

## 2014-03-22 MED ORDER — BRINZOLAMIDE 1 % OP SUSP
1.0000 [drp] | Freq: Two times a day (BID) | OPHTHALMIC | Status: DC
  Administered 2014-03-23 – 2014-03-26 (×5): 1 [drp] via OPHTHALMIC
  Filled 2014-03-22: qty 10

## 2014-03-22 MED ORDER — ALUM & MAG HYDROXIDE-SIMETH 200-200-20 MG/5ML PO SUSP: 30.0000 mL | ORAL | Status: DC | PRN

## 2014-03-22 MED ORDER — PANTOPRAZOLE SODIUM 40 MG PO TBEC
40.0000 mg | DELAYED_RELEASE_TABLET | Freq: Every day | ORAL | Status: DC
  Filled 2014-03-22: qty 1

## 2014-03-22 MED ORDER — PSEUDOEPHEDRINE HCL ER 120 MG PO TB12
120.0000 mg | ORAL_TABLET | Freq: Two times a day (BID) | ORAL | Status: DC
  Administered 2014-03-22 – 2014-03-26 (×8): 120 mg via ORAL
  Filled 2014-03-22 (×10): qty 1

## 2014-03-22 MED ORDER — VITAMIN D (ERGOCALCIFEROL) 1.25 MG (50000 UNIT) PO CAPS
50000.0000 [IU] | ORAL_CAPSULE | ORAL | Status: DC
  Administered 2014-03-24: 50000 [IU] via ORAL
  Filled 2014-03-22: qty 1

## 2014-03-22 MED ORDER — FLUTICASONE PROPIONATE 50 MCG/ACT NA SUSP
1.0000 | Freq: Every day | NASAL | Status: DC
  Administered 2014-03-23 – 2014-03-26 (×4): 1 via NASAL
  Filled 2014-03-22: qty 16

## 2014-03-22 MED ORDER — BISACODYL 5 MG PO TBEC: 5.0000 mg | DELAYED_RELEASE_TABLET | Freq: Every day | ORAL | Status: DC | PRN

## 2014-03-22 MED ORDER — BRIMONIDINE TARTRATE 0.15 % OP SOLN
1.0000 [drp] | Freq: Two times a day (BID) | OPHTHALMIC | Status: DC
  Administered 2014-03-23 – 2014-03-26 (×5): 1 [drp] via OPHTHALMIC
  Filled 2014-03-22: qty 5

## 2014-03-22 MED ORDER — VANCOMYCIN HCL 10 G IV SOLR
1500.0000 mg | Freq: Two times a day (BID) | INTRAVENOUS | Status: AC
  Administered 2014-03-22 – 2014-03-23 (×2): 1500 mg via INTRAVENOUS
  Filled 2014-03-22 (×2): qty 1500

## 2014-03-22 MED ORDER — VITAMIN B-6 100 MG PO TABS
100.0000 mg | ORAL_TABLET | Freq: Every day | ORAL | Status: DC
  Administered 2014-03-23 – 2014-03-26 (×4): 100 mg via ORAL
  Filled 2014-03-22 (×4): qty 1

## 2014-03-22 MED ORDER — OLOPATADINE HCL 0.1 % OP SOLN
1.0000 [drp] | Freq: Two times a day (BID) | OPHTHALMIC | Status: DC
  Administered 2014-03-23 – 2014-03-26 (×5): 1 [drp] via OPHTHALMIC
  Filled 2014-03-22: qty 5

## 2014-03-22 MED ORDER — DEXTROSE 5 % IV SOLN
500.0000 mg | Freq: Four times a day (QID) | INTRAVENOUS | Status: DC | PRN
Start: 2014-03-22 — End: 2014-03-26
  Administered 2014-03-22 – 2014-03-23 (×2): 500 mg via INTRAVENOUS
  Filled 2014-03-22 (×3): qty 5

## 2014-03-22 MED ORDER — METOCLOPRAMIDE HCL 5 MG/ML IJ SOLN
INTRAMUSCULAR | Status: AC
  Filled 2014-03-22: qty 2

## 2014-03-22 MED ORDER — HYDROMORPHONE HCL 2 MG/ML IJ SOLN
INTRAMUSCULAR | Status: AC
  Filled 2014-03-22: qty 1

## 2014-03-22 MED ORDER — DEXAMETHASONE SODIUM PHOSPHATE 10 MG/ML IJ SOLN
INTRAMUSCULAR | Status: AC
  Filled 2014-03-22: qty 1

## 2014-03-22 MED ORDER — DEXAMETHASONE SODIUM PHOSPHATE 10 MG/ML IJ SOLN
INTRAMUSCULAR | Status: DC | PRN
  Administered 2014-03-22: 10 mg via INTRAVENOUS

## 2014-03-22 MED ORDER — VITAMIN B-12 1000 MCG PO TABS
5000.0000 ug | ORAL_TABLET | Freq: Every day | ORAL | Status: DC
  Administered 2014-03-22 – 2014-03-25 (×4): 5000 ug via ORAL
  Filled 2014-03-22 (×5): qty 5

## 2014-03-22 SURGICAL SUPPLY — 74 items
BAG SPEC THK2 15X12 ZIP CLS (MISCELLANEOUS)
BAG ZIPLOCK 12X15 (MISCELLANEOUS) IMPLANT
BANDAGE ELASTIC 4 VELCRO ST LF (GAUZE/BANDAGES/DRESSINGS) ×2 IMPLANT
BANDAGE ELASTIC 6 VELCRO ST LF (GAUZE/BANDAGES/DRESSINGS) ×2 IMPLANT
BANDAGE ESMARK 6X9 LF (GAUZE/BANDAGES/DRESSINGS) ×1 IMPLANT
BLADE SAG 18X100X1.27 (BLADE) ×2 IMPLANT
BLADE SAW SGTL 13.0X1.19X90.0M (BLADE) ×2 IMPLANT
BNDG CMPR 9X6 STRL LF SNTH (GAUZE/BANDAGES/DRESSINGS) ×1
BNDG ESMARK 6X9 LF (GAUZE/BANDAGES/DRESSINGS) ×2
CAP KNEE TOTAL 3 SIGMA ×1 IMPLANT
CAP UPCHARGE REVISION TRAY ×1 IMPLANT
CEMENT HV SMART SET (Cement) ×4 IMPLANT
CEMENT RESTRICTOR DEPUY SZ 4 (Cement) ×1 IMPLANT
CHLORAPREP W/TINT 26ML (MISCELLANEOUS) IMPLANT
CLOTH 2% CHLOROHEXIDINE 3PK (PERSONAL CARE ITEMS) ×2 IMPLANT
CUFF TOURN SGL QUICK 34 (TOURNIQUET CUFF) ×2
CUFF TRNQT CYL 34X4X40X1 (TOURNIQUET CUFF) ×1 IMPLANT
DRAPE INCISE IOBAN 66X45 STRL (DRAPES) IMPLANT
DRAPE ORTHO SPLIT 77X108 STRL (DRAPES) ×4
DRAPE POUCH INSTRU U-SHP 10X18 (DRAPES) ×2 IMPLANT
DRAPE SHEET LG 3/4 BI-LAMINATE (DRAPES) ×2 IMPLANT
DRAPE SURG ORHT 6 SPLT 77X108 (DRAPES) ×2 IMPLANT
DRAPE U-SHAPE 47X51 STRL (DRAPES) ×2 IMPLANT
DRSG ADAPTIC 3X8 NADH LF (GAUZE/BANDAGES/DRESSINGS) IMPLANT
DRSG AQUACEL AG ADV 3.5X10 (GAUZE/BANDAGES/DRESSINGS) ×1 IMPLANT
DRSG OPSITE POSTOP 3X4 (GAUZE/BANDAGES/DRESSINGS) ×1 IMPLANT
DRSG PAD ABDOMINAL 8X10 ST (GAUZE/BANDAGES/DRESSINGS) IMPLANT
DRSG TEGADERM 4X4.75 (GAUZE/BANDAGES/DRESSINGS) IMPLANT
DURAPREP 26ML APPLICATOR (WOUND CARE) ×3 IMPLANT
ELECT REM PT RETURN 9FT ADLT (ELECTROSURGICAL) ×2
ELECTRODE REM PT RTRN 9FT ADLT (ELECTROSURGICAL) ×1 IMPLANT
EVACUATOR 1/8 PVC DRAIN (DRAIN) ×2 IMPLANT
FACESHIELD WRAPAROUND (MASK) ×10 IMPLANT
FACESHIELD WRAPAROUND OR TEAM (MASK) ×5 IMPLANT
GAUZE SPONGE 2X2 8PLY STRL LF (GAUZE/BANDAGES/DRESSINGS) IMPLANT
GAUZE SPONGE 4X4 12PLY STRL (GAUZE/BANDAGES/DRESSINGS) IMPLANT
GLOVE BIOGEL PI IND STRL 7.5 (GLOVE) ×1 IMPLANT
GLOVE BIOGEL PI IND STRL 8 (GLOVE) ×1 IMPLANT
GLOVE BIOGEL PI INDICATOR 7.5 (GLOVE) ×1
GLOVE BIOGEL PI INDICATOR 8 (GLOVE) ×1
GLOVE SURG SS PI 7.5 STRL IVOR (GLOVE) ×2 IMPLANT
GLOVE SURG SS PI 8.0 STRL IVOR (GLOVE) ×4 IMPLANT
GOWN STRL REUS W/TWL XL LVL3 (GOWN DISPOSABLE) ×4 IMPLANT
HANDPIECE INTERPULSE COAX TIP (DISPOSABLE) ×2
IMMOBILIZER KNEE 20 (SOFTGOODS) ×3 IMPLANT
IMMOBILIZER KNEE 20 THIGH 36 (SOFTGOODS) ×1 IMPLANT
KIT BASIN OR (CUSTOM PROCEDURE TRAY) ×2 IMPLANT
MANIFOLD NEPTUNE II (INSTRUMENTS) ×2 IMPLANT
NDL SAFETY ECLIPSE 18X1.5 (NEEDLE) ×1 IMPLANT
NEEDLE HYPO 18GX1.5 SHARP (NEEDLE) ×2
NS IRRIG 1000ML POUR BTL (IV SOLUTION) IMPLANT
PACK TOTAL JOINT (CUSTOM PROCEDURE TRAY) ×2 IMPLANT
PADDING CAST COTTON 6X4 STRL (CAST SUPPLIES) IMPLANT
POSITIONER SURGICAL ARM (MISCELLANEOUS) ×2 IMPLANT
SET HNDPC FAN SPRY TIP SCT (DISPOSABLE) ×1 IMPLANT
SPONGE GAUZE 2X2 STER 10/PKG (GAUZE/BANDAGES/DRESSINGS) ×1
SPONGE SURGIFOAM ABS GEL 100 (HEMOSTASIS) IMPLANT
STAPLER VISISTAT (STAPLE) IMPLANT
STRIP CLOSURE SKIN 1/2X4 (GAUZE/BANDAGES/DRESSINGS) ×1 IMPLANT
SUCTION FRAZIER 12FR DISP (SUCTIONS) ×2 IMPLANT
SUT BONE WAX W31G (SUTURE) IMPLANT
SUT MNCRL AB 4-0 PS2 18 (SUTURE) IMPLANT
SUT VIC AB 1 CT1 27 (SUTURE) ×2
SUT VIC AB 1 CT1 27XBRD ANTBC (SUTURE) ×1 IMPLANT
SUT VIC AB 2-0 CT1 27 (SUTURE) ×6
SUT VIC AB 2-0 CT1 TAPERPNT 27 (SUTURE) ×3 IMPLANT
SUT VLOC 180 0 24IN GS25 (SUTURE) ×2 IMPLANT
SYR 50ML LL SCALE MARK (SYRINGE) ×2 IMPLANT
TOWEL OR 17X26 10 PK STRL BLUE (TOWEL DISPOSABLE) ×2 IMPLANT
TOWEL OR NON WOVEN STRL DISP B (DISPOSABLE) IMPLANT
TOWER CARTRIDGE SMART MIX (DISPOSABLE) ×2 IMPLANT
TRAY FOLEY CATH 14FRSI W/METER (CATHETERS) ×2 IMPLANT
WATER STERILE IRR 1500ML POUR (IV SOLUTION) ×2 IMPLANT
WRAP KNEE MAXI GEL POST OP (GAUZE/BANDAGES/DRESSINGS) ×2 IMPLANT

## 2014-03-22 NOTE — Anesthesia Postprocedure Evaluation (Signed)
  Anesthesia Post-op Note  Patient: [name]  Procedure(s) Performed: Procedure(s) (LRB): RIGHT TOTAL KNEE ARTHROPLASTY (Right)  Patient Location: PACU  Anesthesia Type: General  Level of Consciousness: awake and alert   Airway and Oxygen Therapy: Patient Spontanous Breathing  Post-op Pain: mild  Post-op Assessment: Post-op Vital signs reviewed, Patient's Cardiovascular Status Stable, Respiratory Function Stable, Patent Airway and No signs of Nausea or vomiting  Last Vitals:  Filed Vitals:   03/22/14 1330  BP: 111/50  Pulse: 92  Temp:   Resp: 12    Post-op Vital Signs: stable   Complications: No apparent anesthesia complications

## 2014-03-22 NOTE — Progress Notes (Signed)
RT set pt's home CPAP at 13cmH2O (per home settings) with 3L of oxygen bled in via nasal mask. Sterile water was added for humidification. Pt states she is not ready to be placed on CPAP at this time and does not require any assistance with mask application. Pt was instructed to call RT or RN when ready for CPAP so that oxygen can be bled in. CPAP appears to be in good working condition and the cord is not frayed. [employer] has been contacted and will inspect CPAP tomorrow when available. RT will continue to monitor as needed.

## 2014-03-22 NOTE — Progress Notes (Signed)
Brought whole Cpap machine. Left in car

## 2014-03-22 NOTE — Discharge Instructions (Signed)
Elevate leg above heart 6x a day for 18minutes each Use knee immobilizer while walking until can SLR x 10 Use knee immobilizer in bed to keep knee in extension Aquacel dressing may remain in place until follow up. May shower with aquacel dressing in place. If the dressing becomes saturated or peels off, remove aquacel dressing. Do not remove steri-strips if they are present. Place new dressing with gauze and tape or ACE bandage which should be kept clean and dry and changed daily.

## 2014-03-22 NOTE — Anesthesia Preprocedure Evaluation (Signed)
Anesthesia Evaluation  Patient identified by MRN, date of birth, ID band Patient awake    Reviewed: Allergy & Precautions, H&P , NPO status , Patient's Chart, lab work & pertinent test results  Airway Mallampati: III  TM Distance: <3 FB Neck ROM: Full    Dental no notable dental hx.    Pulmonary shortness of breath and with exertion, asthma , sleep apnea and Continuous Positive Airway Pressure Ventilation , PE breath sounds clear to auscultation  Pulmonary exam normal       Cardiovascular hypertension, Pt. on medications + Peripheral Vascular Disease Rhythm:Regular Rate:Normal     Neuro/Psych glaucoma CVA negative psych ROS   GI/Hepatic negative GI ROS, Neg liver ROS, (+) Hepatitis -, C  Endo/Other  diabetesMorbid obesity  Renal/GU negative Renal ROS  negative genitourinary   Musculoskeletal negative musculoskeletal ROS (+)   Abdominal   Peds negative pediatric ROS (+)  Hematology negative hematology ROS (+)   Anesthesia Other Findings   Reproductive/Obstetrics negative OB ROS                             Anesthesia Physical Anesthesia Plan  ASA: III  Anesthesia Plan: General   Post-op Pain Management:    Induction: Intravenous  Airway Management Planned: Oral ETT  Additional Equipment:   Intra-op Plan:   Post-operative Plan: Extubation in OR  Informed Consent: I have reviewed the patients History and Physical, chart, labs and discussed the procedure including the risks, benefits and alternatives for the proposed anesthesia with the patient or authorized representative who has indicated his/her understanding and acceptance.   Dental Advisory Given  Plan Discussed with: CRNA and Surgeon  Anesthesia Plan Comments:         Anesthesia Quick Evaluation

## 2014-03-22 NOTE — H&P (View-Only) (Signed)
Magnus Ivan Mundo DOB: 03/20/1961 Single / Language: [name] / Race: Black or African American Female  H&P Date: 03/13/14  Chief Complaint: Right knee pain  History of Present Illness The patient is a 53 year old female who comes in today for a preoperative History and Physical. The patient is scheduled for a right total knee arthroplasty to be performed by Dr. Johnn Hai, MD at [facility] on 03/22/14. Larhonda has had pain in the knee for years, refractory to steroid injections, viscosupplementation, bracing, activity modifications, relative rest, PT/HEP, weight loss efforts, pain medications. Her continued pain is interfering with ADL's at this point and she desires to proceed with surgery.  Dr. Tonita Cong and the patient mutually agreed to proceed with a total knee replacement. Risks and benefits of the procedure were discussed including stiffness, suboptimal range of motion, persistent pain, infection requiring removal of prosthesis and reinsertion, need for prophylactic antibiotics in the future, for example, dental procedures, possible need for manipulation, revision in the future and also anesthetic complications including DVT, PE, etc. We discussed the perioperative course, time in the hospital, postoperative recovery and the need for elevation to control swelling. We also discussed the predicted range of motion and the probability that squatting and kneeling would be unobtainable in the future. In addition, postoperative anticoagulation was discussed. We have obtained preoperative medical clearance as necessary. Provided her illustrated handout and discussed it in detail. They will enroll in the total joint replacement educational forum at the hospital.  She is on xarelto 20mg  daily for prior DVT, has been instructed to hold 5 days prior. Has also been instructed to hold supplements, vitamins accordingly and her celebrex. She is s/p recent lap band ontop of her prior gastric bypass, has  been told she cannot swallow any pills larger than the circumference of a dime due to the lap band and that she may need elixir. After her left total knee she went to inpatient rehab and did well there, took oxycontin and percocet for pain.  Allergies  Sulfanomides red eyes, hives, welts Thimerosal *ANTISEPTICS & DISINFECTANTS* hives, itching, swelling Penicillins hives, swelling, itching BETA BLOCKERS06/28/2006 red eyes, congestion  Family History Drug / Alcohol Addiction father, grandmother fathers side and grandfather fathers side Osteoarthritis mother and father Diabetes Mellitus mother, father and brother Cerebrovascular Accident mother, brother and grandmother mothers side First Degree Relatives  Social History  Tobacco use Never smoker. never smoker Drug/Alcohol Rehab (Currently) no Tobacco / smoke exposure no Pain Contract no Exercise Exercises weekly; does other Marital status single Illicit drug use no Living situation live alone, one story house, one step to enter Number of flights of stairs before winded 2-3 Children 0 Alcohol use former drinker Current work status working full time Drug/Alcohol Rehab (Previously) no Post-Surgical Plans inpatient rehab - [facility]  Medication History Qvar (80MCG/ACT Aerosol Soln, Inhalation) Active. Montelukast Sodium (10MG  Tablet, Oral) Active. DHA Complete (200MG  Capsule, Oral) Active. Nasonex (Nasal) Specific dose unknown - Active. Xarelto (20MG  Tablet, Oral) Active. Alphagan P (0.1% Solution, Ophthalmic) Active. ALPRAZolam (0.5MG  Tablet, Oral) Active. Azopt (1% Suspension, Ophthalmic) Active. Cyclobenzaprine HCl (10MG  Tablet, Oral) Active. Diltiazem HCl Coated Beads (240MG  Capsule ER 24HR, Oral) Active. Diovan HCT (80-12.5MG  Tablet, Oral) Active. Lumigan (0.01% Solution, Ophthalmic) Active. NexIUM (40MG  Capsule DR, Oral) Active. Potassium Chloride Crys CR (20MEQ Tablet ER, Oral)  Active. Qualaquin (324MG  Capsule, Oral) Active. CeleBREX (200MG  Capsule, Oral) Active. Pataday (0.2% Solution, Ophthalmic as needed) Active. Medications Reconciled  Past Surgical History Cataract Surgery right Arthroscopy  of Knee left hysteroscopy and D&C 2000 Filter 2010 Gastric bypass 2010 Hysteroscopy 2010 G2X filter removed 2010 Left eye laser surgery 2011 Open R eye glaucoma surgery 2011 Post Bleak: Flat anterior chamber 2011 cataract surgery 2011 IV filter placement 2012 ACDF C3-4, 4-5 2012 L TKA 2013 L eye laser surgery 2014 Avastin eye injections R eye 2014 Hiatal Hernia/Lap band 2015  Past Medical Hx Asthma Allergic Urticaria Depression Blood Clot Diabetes Mellitus, Type II Migraine Headache Anxiety Disorder Sleep Apnea on CPAP Osteoarthritis High blood pressure Gastroesophageal Reflux Disease Glaucoma  Review of Systems General Not Present- Chills, Fatigue, Fever, Memory Loss, Night Sweats, Weight Gain and Weight Loss. Skin Not Present- Eczema, Hives, Itching, Lesions and Rash. HEENT Not Present- Dentures, Double Vision, Headache, Hearing Loss, Tinnitus and Visual Loss. Respiratory Not Present- Allergies, Chronic Cough, Coughing up blood, Shortness of breath at rest and Shortness of breath with exertion. Note: sinus congestion Cardiovascular Not Present- Chest Pain, Difficulty Breathing Lying Down, Murmur, Palpitations, Racing/skipping heartbeats and Swelling. Gastrointestinal Not Present- Abdominal Pain, Bloody Stool, Constipation, Diarrhea, Difficulty Swallowing, Heartburn, Jaundice, Loss of appetitie, Nausea and Vomiting. Female Genitourinary Not Present- Blood in Urine, Discharge, Flank Pain, Incontinence, Painful Urination, Urgency, Urinary frequency, Urinary Retention, Urinating at Night and Weak urinary stream. Musculoskeletal Present- Joint Pain, Joint Stiffness, Joint Swelling and Morning Stiffness. Not Present- Back Pain, Muscle  Pain, Muscle Weakness and Spasms. Neurological Not Present- Blackout spells, Difficulty with balance, Dizziness, Paralysis, Tremor and Weakness. Psychiatric Not Present- Insomnia.  Physical Exam  General Mental Status -Alert, cooperative and good historian. General Appearance-pleasant, Not in acute distress. Orientation-Oriented X3. Build & Nutrition-Well nourished and Well developed. Gait-Stiff and Antalgic.  Head and Neck Head-normocephalic, atraumatic . Neck Global Assessment - supple, no bruit auscultated on the right, no bruit auscultated on the left.  Eye Pupil - Bilateral-Regular and Round. Motion - Bilateral-EOMI.  Chest and Lung Exam Auscultation Breath sounds - clear at anterior chest wall and clear at posterior chest wall. Adventitious sounds - No Adventitious sounds.  Cardiovascular Auscultation Rhythm - Regular rate and rhythm. Heart Sounds - S1 WNL and S2 WNL. Murmurs & Other Heart Sounds - Auscultation of the heart reveals - No Murmurs.  Abdomen Palpation/Percussion Tenderness - Abdomen is non-tender to palpation. Rigidity (guarding) - Abdomen is soft. Auscultation Auscultation of the abdomen reveals - Bowel sounds normal.  Female Genitourinary Note: Not done, not pertinent to present illness  Musculoskeletal Note: Right Knee: Inspection and Palpation - Tenderness - medial joint line tender to palpation and lateral joint line tender to palpation, no tenderness to palpation of the superior calf, no tenderness to palpation of the quadriceps tendon, no tenderness to palpation of the patellar tendon, no tenderness to palpation of the fibular head, no tenderness to palpation of the peroneal nerve. Effusion - trace. Tissue tension/texture is - soft. Crepitus - moderate patellofemoral crepitus. Pulses - 2+. Sensation - intact to light touch. Skin - Color - no ecchymosis, no erythema. Strength and Tone - Quadriceps - 5/5. Hamstrings - 5/5. ROM:  Flexion - AROM - 95 . Extension - AROM - 0 . Stability - Valgus Laxity at 30 - None. Valgus Laxity at 0 - None. Varus Laxity at 30 - None. Varus Laxity at 0 - None. Lachman - Negative. Anterior Drawer Test - Negative. Posterior Drawer Test - Negative. Right Knee - Deformities/Malalignments/Discrepancies - no deformities noted. Special Tests - McMurray Test (lateral) - negative. McMurray Test (medial) - negative. Patellar Compression Pain - mild pain.  Imaging  Prior xrays reviewed with end-stage osteoarthritis, bone-on-bone changes.  Assessment & Plan  Primary osteoarthritis of knee, right  Pt with end-stage R knee DJD, bone-on-bone, refractory to conservative tx, scheduled for right total knee replacement by Dr. Tonita Cong on 03/22/14. We again discussed the procedure itself as well as risks, complications and alternatives, including but not limited to DVT, PE, infx, bleeding, failure of procedure, need for secondary procedure including manipulation, nerve injury, ongoing pain/symptoms, anesthesia risk, even stroke or death. Also discussed typical post-op protocols, activity restrictions, need for PT, flexion/extension exercises, time out of work. Discussed need for DVT ppx post-op with Xarelto then ASA per protocol. Discussed dental ppx. Also discussed limitations post-operatively such as kneeling and squatting. All questions were answered. Patient desires to proceed with surgery as scheduled. Will hold Xarelto, supplements, vitamins, and NSAIDs accordingly, discussed in detail. Will remain NPO after MN night before surgery. She had her [facility] pre-op testing appt this morning. Her potassium tends to run low, will plan an IV with potassium post-op to help prevent hypokalemia. Plan vanco peri-operatively for abx ppx given her swelling allergy with cephalosporins. Plan to resume Xarelto post-op accordingly for DVT ppx. Plan Percocet or Dilaudid (may require elixir depending on size of pills), Robaxin, Colace.  Plan inpatient [facility] rehab, which she has already discussed with her PCP, insurance company, and the rehab unit, she did well with that last time. Will follow up 10-14 days post-op for suture removal and xrays. She will call with any questions or concerns in the interim.  Plan right total knee replacement  Signed electronically by Lacie Draft, PA-C for Dr. Tonita Cong

## 2014-03-22 NOTE — Plan of Care (Signed)
Problem: Phase I Progression Outcomes Goal: Pain controlled with appropriate interventions Outcome: Progressing     

## 2014-03-22 NOTE — Plan of Care (Signed)
Problem: Phase I Progression Outcomes Goal: Initial discharge plan identified Outcome: Progressing Pt stated she wanted to try and go to inpatient rehab when discharged

## 2014-03-22 NOTE — Plan of Care (Signed)
Problem: Phase II Progression Outcomes Goal: Tolerating diet Outcome: Progressing Pt tolerated clear liquids with no difficulty

## 2014-03-22 NOTE — Interval H&P Note (Signed)
History and Physical Interval Note:  03/22/2014 7:23 AM  [name]  has presented today for surgery, with the diagnosis of right knee djd  The various methods of treatment have been discussed with the patient and family. After consideration of risks, benefits and other options for treatment, the patient has consented to  Procedure(s): RIGHT TOTAL KNEE ARTHROPLASTY (Right) as a surgical intervention .  The patient's history has been reviewed, patient examined, no change in status, stable for surgery.  I have reviewed the patient's chart and labs.  Questions were answered to the patient's satisfaction.     [name]

## 2014-03-22 NOTE — Transfer of Care (Signed)
Immediate Anesthesia Transfer of Care Note  Patient: [name]  Procedure(s) Performed: Procedure(s): RIGHT TOTAL KNEE ARTHROPLASTY (Right)  Patient Location: PACU  Anesthesia Type:General  Level of Consciousness: awake, sedated and patient cooperative  Airway & Oxygen Therapy: Patient Spontanous Breathing and Patient connected to face mask oxygen  Post-op Assessment: Report given to PACU RN and Post -op Vital signs reviewed and stable  Post vital signs: Reviewed and stable  Complications: No apparent anesthesia complications

## 2014-03-22 NOTE — Brief Op Note (Signed)
03/22/2014  12:46 PM  PATIENT:  [name]  53 y.o. female  PRE-OPERATIVE DIAGNOSIS:  right knee degenerative joint disease  POST-OPERATIVE DIAGNOSIS:  right knee degenerative joint disease  PROCEDURE:  Procedure(s): RIGHT TOTAL KNEE ARTHROPLASTY (Right)  SURGEON:  Surgeon(s) and Role:    * Johnn Hai, MD - Primary  PHYSICIAN ASSISTANT:   ASSISTANTS: Bissell   ANESTHESIA:   general  EBL:  Total I/O In: 2000 [I.V.:2000] Out: -   BLOOD ADMINISTERED:none  DRAINS: none hemoavc  LOCAL MEDICATIONS USED:  MARCAINE     SPECIMEN:  No Specimen  DISPOSITION OF SPECIMEN:  N/A  COUNTS:  YES  TOURNIQUET:  * Missing tourniquet times found for documented tourniquets in log:  [id number] *  DICTATION: .Other Dictation: Dictation Number   [id number]  PLAN OF CARE: Admit to inpatient   PATIENT DISPOSITION:  PACU - hemodynamically stable.   Delay start of Pharmacological VTE agent (>24hrs) due to surgical blood loss or risk of bleeding: no

## 2014-03-23 LAB — BASIC METABOLIC PANEL
Anion gap: 11 (ref 5–15)
BUN: 12 mg/dL (ref 6–23)
CO2: 25 mEq/L (ref 19–32)
Calcium: 9.2 mg/dL (ref 8.4–10.5)
Chloride: 105 mEq/L (ref 96–112)
Creatinine, Ser: 0.95 mg/dL (ref 0.50–1.10)
GFR calc Af Amer: 77 mL/min — ABNORMAL LOW (ref >90)
GFR calc non Af Amer: 66 mL/min — ABNORMAL LOW (ref >90)
Glucose, Bld: 159 mg/dL — ABNORMAL HIGH (ref 70–99)
Potassium: 3.8 mEq/L (ref 3.7–5.3)
Sodium: 141 mEq/L (ref 137–147)

## 2014-03-23 LAB — CBC
HCT: 33.4 % — ABNORMAL LOW (ref 36.0–46.0)
Hemoglobin: 11.3 g/dL — ABNORMAL LOW (ref 12.0–15.0)
MCH: 26.8 pg (ref 26.0–34.0)
MCHC: 33.8 g/dL (ref 30.0–36.0)
MCV: 79.1 fL (ref 78.0–100.0)
Platelets: 171 10*3/uL (ref 150–400)
RBC: 4.22 MIL/uL (ref 3.87–5.11)
RDW: 16.1 % — ABNORMAL HIGH (ref 11.5–15.5)
WBC: 7.8 10*3/uL (ref 4.0–10.5)

## 2014-03-23 MED ORDER — HYDROCODONE-ACETAMINOPHEN 7.5-325 MG/15ML PO SOLN
ORAL | Status: AC
  Filled 2014-03-23: qty 15

## 2014-03-23 MED ORDER — HYDROXYZINE HCL 25 MG PO TABS
25.0000 mg | ORAL_TABLET | Freq: Four times a day (QID) | ORAL | Status: DC | PRN
  Administered 2014-03-23 – 2014-03-26 (×11): 25 mg via ORAL
  Filled 2014-03-23 (×14): qty 1

## 2014-03-23 MED ORDER — HYDROCORTISONE 1 % EX CREA
TOPICAL_CREAM | Freq: Two times a day (BID) | CUTANEOUS | Status: DC
  Administered 2014-03-23 – 2014-03-25 (×3): via TOPICAL
  Filled 2014-03-23: qty 28

## 2014-03-23 MED ORDER — HYDROCODONE-ACETAMINOPHEN 7.5-325 MG/15ML PO SOLN
15.0000 mL | ORAL | Status: DC | PRN
  Administered 2014-03-23 (×2): 15 mL via ORAL
  Administered 2014-03-24 (×4): 30 mL via ORAL
  Administered 2014-03-25: 15 mL via ORAL
  Administered 2014-03-25 (×4): 30 mL via ORAL
  Administered 2014-03-26 (×5): 15 mL via ORAL
  Filled 2014-03-23 (×3): qty 15
  Filled 2014-03-23 (×3): qty 30
  Filled 2014-03-23: qty 15
  Filled 2014-03-23 (×4): qty 30
  Filled 2014-03-23: qty 15
  Filled 2014-03-23: qty 30

## 2014-03-23 MED ORDER — NON FORMULARY: 40.0000 mg | Freq: Every day | Status: DC

## 2014-03-23 MED ORDER — HYDROCORTISONE 1 % EX LOTN
TOPICAL_LOTION | Freq: Two times a day (BID) | CUTANEOUS | Status: DC
  Filled 2014-03-23: qty 118

## 2014-03-23 MED ORDER — MORPHINE SULFATE 2 MG/ML IJ SOLN
INTRAMUSCULAR | Status: AC
  Filled 2014-03-23: qty 1

## 2014-03-23 MED ORDER — MORPHINE SULFATE 2 MG/ML IJ SOLN
1.0000 mg | INTRAMUSCULAR | Status: DC | PRN
  Administered 2014-03-23 (×2): 1 mg via INTRAVENOUS

## 2014-03-23 MED ORDER — OXYCODONE HCL 5 MG PO TABS
10.0000 mg | ORAL_TABLET | ORAL | Status: DC | PRN
  Administered 2014-03-23: 15 mg via ORAL

## 2014-03-23 MED ORDER — ESOMEPRAZOLE MAGNESIUM 40 MG PO CPDR
40.0000 mg | DELAYED_RELEASE_CAPSULE | Freq: Every day | ORAL | Status: DC
  Administered 2014-03-23 – 2014-03-26 (×4): 40 mg via ORAL
  Filled 2014-03-23 (×4): qty 1

## 2014-03-23 NOTE — Consult Note (Signed)
Physical Medicine and Rehabilitation Consult  Reason for Consult: Endstage DJD right knee Referring Physician: Dr. Tonita Cong   HPI: [name] is a 53 y.o. 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 She elected to undergo R-TKR on 03/22/14 by Dr. Tonita Cong. Post op WBAT on chronic  Xarelto resumed. Therapy evaluations in progress and CIR recommended for follow up therapy.    Review of Systems  Musculoskeletal: Positive for myalgias and joint pain.      Past Medical History  Diagnosis Date  . Hypertension   . Asthma   . Morbid obesity   . PE (pulmonary embolism)   . DVT (deep venous thrombosis)   . GERD (gastroesophageal reflux disease)   . Dysfunctional uterine bleeding   . Headache(784.0)     MIGRAINES IN PAST  . Glaucoma   . Sleep apnea, obstructive     USES C-PAP  . Arthritis     knee     Past Surgical History  Procedure Laterality Date  . Posterior laminectomy / decompression cervical spine  01/26/2011  . Glaucoma surgery  10/31/2009  . Gastric bypass  [id number]  . Knee arthroscopy  12/2007  . Dilation and curettage of uterus    . Hysteroscopy    . Eye surgery  11/16/2009    FOR GLAUCOMA AND LASER SURGERY  X2  . Cataract surg    . Ivc filter      2010 - THEN REMOVED THEN PLACED AGAIN 2012  . Total knee arthroplasty  09/09/2011    Procedure: TOTAL KNEE ARTHROPLASTY;  Surgeon: Johnn Hai, MD;  Location: [facility];  Service: Orthopedics;  Laterality: Left;  . Capsulotomy  05/03/2012    Procedure: MINOR CAPSULOTOMY;  Surgeon: Myrtha Mantis., MD;  Location: [place];  Service: Ophthalmology;  Laterality: Right;  . Yag laser application  08/20/8880    Procedure: YAG LASER APPLICATION;  Surgeon: Myrtha Mantis., MD;  Location: [facility];  Service: Ophthalmology;  Laterality: Right;  . Capsulotomy  05/05/2012    Procedure:  MINOR CAPSULOTOMY;  Surgeon: Myrtha Mantis., MD;  Location: [place];  Service: Ophthalmology;  Laterality: Right;  . Yag laser application  8/00/3491    Procedure: YAG LASER APPLICATION;  Surgeon: Myrtha Mantis., MD;  Location: [place];  Service: Ophthalmology;  Laterality: Right;  . Abdominal hysterectomy    . Endometrial ablation      failure   . Laparoscopic gastric banding N/A 01/30/2014    Procedure: LAPAROSCOPIC GASTRIC BANDING over bypass ;  Surgeon: Pedro Earls, MD;  Location: [facility];  Service: General;  Laterality: N/A;  . Hiatal hernia repair  01/30/2014    Procedure: HERNIA REPAIR HIATAL;  Surgeon: Pedro Earls, MD;  Location: [facility];  Service: General;;  . Upper gi endoscopy N/A 01/30/2014    Procedure: UPPER GI ENDOSCOPY;  Surgeon: Pedro Earls, MD;  Location: [facility];  Service: General;  Laterality: N/A;  . Hernia repair    . Joint replacement      Family History  Problem Relation Age of Onset  . Glaucoma    . Cerebral aneurysm    . Stroke    . Arthritis Mother   . Hypertension Mother   . Diabetes Mother   . Arthritis Father   . Diabetes Father   .  Alcohol abuse Father     Social History:  Lives alone. Works full time at [employer]. Per reports that she has never smoked. She has never used smokeless tobacco. Per reports that she does not drink alcohol or use illicit drugs.    Allergies  Allergen Reactions  . Beta Adrenergic Blockers Other (See Comments)    RED EYES AND CONGESTION  . Penicillins Hives, Itching and Swelling  . Sulfa Antibiotics Hives    RED EYES  . Thimerosal Hives and Itching    Medications Prior to Admission  Medication Sig Dispense Refill  . acetaminophen (TYLENOL) 500 MG tablet Take 1,000 mg by mouth every 4 (four) hours as needed for moderate pain.    Marland Kitchen ALPRAZolam (XANAX) 0.25 MG tablet Take 0.25 mg by mouth at bedtime as needed for sleep.     . Ascorbic Acid (VITAMIN C) 1000 MG tablet Take  1,000 mg by mouth every morning.     Marland Kitchen b complex vitamins tablet Take 1 tablet by mouth every morning.     . beclomethasone (QVAR) 80 MCG/ACT inhaler Inhale 2 puffs into the lungs 2 (two) times daily.    . bimatoprost (LUMIGAN) 0.01 % SOLN Place 1 drop into both eyes at bedtime.     . brimonidine (ALPHAGAN P) 0.1 % SOLN Place 1 drop into both eyes 2 (two) times daily.    . brinzolamide (AZOPT) 1 % ophthalmic suspension Place 1 drop into both eyes 2 (two) times daily.     . budesonide-formoterol (SYMBICORT) 160-4.5 MCG/ACT inhaler Inhale 2 puffs into the lungs 2 (two) times daily.    . Calcium Carbonate-Vit D-Min (CALTRATE 600+D PLUS MINERALS PO) Take 1 tablet by mouth 3 (three) times daily.     . celecoxib (CELEBREX) 200 MG capsule Take 200 mg by mouth every morning.     . ciclesonide (ALVESCO) 160 MCG/ACT inhaler Inhale 2 puffs into the lungs 2 (two) times daily.     . Cyanocobalamin (VITAMIN B-12) 2500 MCG SUBL Place 2 tablets under the tongue at bedtime.    . cyclobenzaprine (FLEXERIL) 10 MG tablet Take 10 mg by mouth at bedtime.     Marland Kitchen diltiazem (DILACOR XR) 240 MG 24 hr capsule Take 240 mg by mouth at bedtime.    Marland Kitchen esomeprazole (NEXIUM) 40 MG capsule Take 40 mg by mouth daily before breakfast.    . fish oil-omega-3 fatty acids 1000 MG capsule Take 1 g by mouth 2 (two) times daily.     Marland Kitchen HYDROCODONE-ACETAMINOPHEN PO Take 15 mLs by mouth 4 (four) times daily as needed (pain). (7.5-325 mg liquid)    . iron polysaccharides (NIFEREX) 150 MG capsule Take 150 mg by mouth 2 (two) times daily.    Marland Kitchen loratadine (CLARITIN) 10 MG tablet Take 10 mg by mouth at bedtime.    Marland Kitchen loratadine-pseudoephedrine (CLARITIN-D 12-HOUR) 5-120 MG per tablet Take 1 tablet by mouth 2 (two) times daily.    . magnesium chloride (SLOW-MAG) 64 MG TBEC Take 1-2 tablets by mouth 2 (two) times daily. Patient takes 2 tablet every morning and 1 tablet with dinner    . mometasone (NASONEX) 50 MCG/ACT nasal spray Place 2 sprays into  the nose daily as needed (allergies).     . montelukast (SINGULAIR) 10 MG tablet Take 10 mg by mouth at bedtime.    . Multiple Vitamin (MULTIVITAMIN) tablet Take 1 tablet by mouth 2 (two) times daily.     . niacin 500 MG tablet Take 500 mg  by mouth daily with breakfast. Needs to be flush free per patient    . Olopatadine HCl (PATADAY) 0.2 % SOLN Apply 1 drop to eye 2 (two) times daily as needed.     . potassium chloride SA (K-DUR,KLOR-CON) 20 MEQ tablet Take 20-40 mEq by mouth 2 (two) times daily. 40 meq with breakfast and 20 meq with dinner    . pyridOXINE (VITAMIN B-6) 100 MG tablet Take 100 mg by mouth every morning.     . quiNINE (QUALAQUIN) 324 MG capsule Take 324 mg by mouth at bedtime.     . Rivaroxaban (XARELTO) 20 MG TABS Take 20 mg by mouth at bedtime.     . sodium chloride (OCEAN) 0.65 % SOLN nasal spray Place 2 sprays into both nostrils 2 (two) times daily.    . valsartan-hydrochlorothiazide (DIOVAN-HCT) 80-12.5 MG per tablet Take 1 tablet by mouth every morning.     . Vitamin D, Ergocalciferol, (DRISDOL) 50000 UNITS CAPS capsule Take 50,000 Units by mouth every 7 (seven) days. Patient takes on Saturday    . diclofenac sodium (VOLTAREN) 1 % GEL Apply 1 application topically 4 (four) times daily as needed (for joint pain.).     Marland Kitchen Docosahexaenoic Acid (DHA PO) Take 1 tablet by mouth 2 (two) times daily.    Marland Kitchen levalbuterol (XOPENEX) 1.25 MG/3ML nebulizer solution Take 1.25 mg by nebulization every 6 (six) hours as needed for wheezing or shortness of breath.     . traMADol (ULTRAM) 50 MG tablet Take 1 tablet (50 mg total) by mouth every 6 (six) hours as needed. (Patient not taking: Reported on 03/12/2014) 15 tablet 0    Home: Home Living Family/patient expects to be discharged to:: Private residence Living Arrangements: Non-relatives/Friends Home Equipment: Bedside commode, Tub bench  Functional History: Prior Function Level of Independence: Independent with assistive  device(s) Comments: has housemate who can help if needed Functional Status:  Mobility: Bed Mobility General bed mobility comments: pt up in chair Transfers Equipment used: Rolling walker (2 wheeled) Transfers: Sit to/from Stand Sit to Stand: Min guard General transfer comment: for safety:  one cue for LE placement      ADL: ADL Overall ADL's : Needs assistance/impaired Grooming: Oral care, Set up, Sitting Upper Body Bathing: Set up, Sitting Lower Body Bathing: Sit to/from stand, With adaptive equipment, Min guard Upper Body Dressing : Set up, Sitting Lower Body Dressing: Minimal assistance, Sit to/from stand, With adaptive equipment Toilet Transfer: Min guard, Ambulation, RW, BSC Toileting- Clothing Manipulation and Hygiene: Supervision/safety, Sitting/lateral lean General ADL Comments: pt performed ADL from recliner:  donned bra, 2 pairs of underwear.  She used reacher for underwear. Did not practice with sock aide today.   Pt only  needed cue for hand placement during OT for hand placement or safety  Cognition: Cognition Overall Cognitive Status: Within Functional Limits for tasks assessed Orientation Level: Oriented X4 Cognition Arousal/Alertness: Awake/alert Behavior During Therapy: WFL for tasks assessed/performed Overall Cognitive Status: Within Functional Limits for tasks assessed  Blood pressure 131/51, pulse 71, temperature 98.9 F (37.2 C), temperature source Oral, resp. rate 16, height 5\' 4"  (1.626 m), weight 121.564 kg (268 lb), last menstrual period 06/02/2011, SpO2 100 %. Physical Exam  Constitutional: She appears well-developed.  HENT:  Head: Normocephalic.  Cardiovascular: Normal rate.   Respiratory: No respiratory distress.  Musculoskeletal:  Right knee dressed    Results for orders placed or performed during the hospital encounter of 03/22/14 (from the past 24 hour(s))  CBC  Status: Abnormal   Collection Time: 03/23/14  4:35 AM  Result Value Ref  Range   WBC 7.8 4.0 - 10.5 K/uL   RBC 4.22 3.87 - 5.11 MIL/uL   Hemoglobin 11.3 (L) 12.0 - 15.0 g/dL   HCT 33.4 (L) 36.0 - 46.0 %   MCV 79.1 78.0 - 100.0 fL   MCH 26.8 26.0 - 34.0 pg   MCHC 33.8 30.0 - 36.0 g/dL   RDW 16.1 (H) 11.5 - 15.5 %   Platelets 171 150 - 400 K/uL  Basic metabolic panel     Status: Abnormal   Collection Time: 03/23/14  4:35 AM  Result Value Ref Range   Sodium 141 137 - 147 mEq/L   Potassium 3.8 3.7 - 5.3 mEq/L   Chloride 105 96 - 112 mEq/L   CO2 25 19 - 32 mEq/L   Glucose, Bld 159 (H) 70 - 99 mg/dL   BUN 12 6 - 23 mg/dL   Creatinine, Ser 0.95 0.50 - 1.10 mg/dL   Calcium 9.2 8.4 - 10.5 mg/dL   GFR calc non Af Amer 66 (L) >90 mL/min   GFR calc Af Amer 77 (L) >90 mL/min   Anion gap 11 5 - 15   Dg Knee 2 Views Right  03/22/2014   CLINICAL DATA:  DJD.  EXAM: RIGHT KNEE - 1-2 VIEW  COMPARISON:  MRI 06/15/2005.  FINDINGS: Severe tricompartment degenerative change present. Degenerative changes are most prominent along the lateral and patellofemoral compartments. Very prominent osteophytes noted patella femoral joint space. No acute bony abnormality.  IMPRESSION: Severe tricompartment degenerative change with degenerative changes most severe scratch about the lateral compartment and patellofemoral compartment. Very large osteophytes are present. Severe loss of joint space present.   Electronically Signed   By: Marcello Moores  Register   On: 03/22/2014 10:16   Dg Knee Right Port  03/22/2014   CLINICAL DATA:  Status post right knee replacement  EXAM: PORTABLE RIGHT KNEE - 1-2 VIEW  COMPARISON:  03/22/2014  FINDINGS: Two views of the right knee submitted. There is right knee prosthesis in anatomic alignment. Postsurgical changes with postsurgical drain in place. Small amount of periarticular soft tissue air.  IMPRESSION: Right knee prosthesis in anatomic alignment.   Electronically Signed   By: Lahoma Crocker M.D.   On: 03/22/2014 14:12    Assessment/Plan: Diagnosis: right  TKA 1. Does the need for close, 24 hr/day medical supervision in concert with the patient's rehab needs make it unreasonable for this patient to be served in a less intensive setting? Potentially 2. Co-Morbidities requiring supervision/potential complications: morbid obesity 3. Due to bladder management, bowel management, safety, skin/wound care, disease management, medication administration, pain management and patient education, does the patient require 24 hr/day rehab nursing? Yes 4. Does the patient require coordinated care of a physician, rehab nurse, PT (1-2 hrs/day, 5 days/week) and OT (1-2 hrs/day, 5 days/week) to address physical and functional deficits in the context of the above medical diagnosis(es)? Yes Addressing deficits in the following areas: balance, endurance, locomotion, strength, transferring, bowel/bladder control, bathing, dressing, feeding, grooming and toileting 5. Can the patient actively participate in an intensive therapy program of at least 3 hrs of therapy per day at least 5 days per week? Yes 6. The potential for patient to make measurable gains while on inpatient rehab is excellent 7. Anticipated functional outcomes upon discharge from inpatient rehab are modified independent  with PT, modified independent with OT, n/a with SLP. 8. Estimated rehab length of stay to  reach the above functional goals is: 7 days 9. Does the patient have adequate social supports and living environment to accommodate these discharge functional goals? Yes 10. Anticipated D/C setting: Home 11. Anticipated post D/C treatments: HH therapy and Outpatient therapy 12. Overall Rehab/Functional Prognosis: excellent  RECOMMENDATIONS: This patient's condition is appropriate for continued rehabilitative care in the following setting: CIR Patient has agreed to participate in recommended program. Yes Note that insurance prior authorization may be required for reimbursement for recommended  care.  Comment: Rehab Admissions Coordinator to follow up.  Thanks,  Meredith Staggers, MD, Mellody Drown     03/23/2014

## 2014-03-23 NOTE — Evaluation (Addendum)
Occupational Therapy Evaluation Patient Details Name: [name] Cofer MRN: [id number] DOB: Mar 14, 1961 Today's Date: 03/23/2014    History of Present Illness s/p R TKA   Clinical Impression   This 53 year old female was admitted for the above surgery. She has h/o L TKA from 2013.  Pt is currently min guard to min A for adls.  Goals in acute are for mod I.  Pt would like to return to [facility] for post acute rehab.  Will follow.    Follow Up Recommendations  [facility] (pt would like CIR--has been there in the past)    Equipment Recommendations  None recommended by OT    Recommendations for Other Services       Precautions / Restrictions Precautions Precautions: Knee Restrictions Weight Bearing Restrictions: No      Mobility Bed Mobility               General bed mobility comments: pt up in chair  Transfers   Equipment used: Rolling walker (2 wheeled) Transfers: Sit to/from Stand Sit to Stand: Min guard         General transfer comment: for safety:  one cue for LE placement    Balance                                            ADL Overall ADL's : Needs assistance/impaired     Grooming: Oral care;Set up;Sitting   Upper Body Bathing: Set up;Sitting   Lower Body Bathing: Sit to/from stand;With adaptive equipment;Min guard   Upper Body Dressing : Set up;Sitting   Lower Body Dressing: Minimal assistance;Sit to/from stand;With adaptive equipment   Toilet Transfer: Min guard;Ambulation;RW;BSC   Toileting- Clothing Manipulation and Hygiene: Supervision/safety;Sitting/lateral lean         General ADL Comments: pt performed ADL from recliner:  donned bra, 2 pairs of underwear.  She used reacher for underwear. Did not practice with sock aide today.   Pt only  needed cue for hand placement during OT for hand placement or safety.  After completing ADL, pt needed to use bathroom:  Walked and used 3:1 over toilet     Vision                      Perception     Praxis      Pertinent Vitals/Pain Pain Assessment: 0-10 Pain Score: 2  Pain Location: L knee Pain Descriptors / Indicators: Sore Pain Intervention(s): Limited activity within patient's tolerance;Monitored during session;Repositioned;Ice applied     Hand Dominance     Extremity/Trunk Assessment Upper Extremity Assessment Upper Extremity Assessment: Overall WFL for tasks assessed           Communication Communication Communication: No difficulties   Cognition Arousal/Alertness: Awake/alert Behavior During Therapy: WFL for tasks assessed/performed Overall Cognitive Status: Within Functional Limits for tasks assessed                     General Comments       Exercises       Shoulder Instructions      Home Living Family/patient expects to be discharged to:: Private residence                   Bathroom Shower/Tub: Tub/shower unit Shower/tub characteristics: Architectural technologist: Standard     Home Equipment: Bedside commode;Tub bench  Prior Functioning/Environment Level of Independence: Independent with assistive device(s)        Comments: has housemate who can help if needed    OT Diagnosis: Acute pain   OT Problem List:     OT Treatment/Interventions:      OT Goals(Current goals can be found in the care plan section) Acute Rehab OT Goals Patient Stated Goal: get back being independent ADL Goals Pt Will Perform Lower Body Dressing: with modified independence;with adaptive equipment;sit to/from stand Pt Will Transfer to Toilet: with modified independence;bedside commode;ambulating Pt Will Perform Tub/Shower Transfer: tub bench;with supervision;ambulating (with leg lifter)  OT Frequency:     Barriers to D/C:            Co-evaluation              End of Session    Activity Tolerance: Patient tolerated treatment well Patient left: with call bell/phone within reach;in chair;with  nursing/sitter in room   Time: 0919-1012 OT Time Calculation (min): 53 min Charges:  OT General Charges $OT Visit: 1 Procedure OT Evaluation $Initial OT Evaluation Tier I: 1 Procedure OT Treatments $Self Care/Home Management : 38-52 mins G-Codes:    Tikita Mabee Apr 16, 2014, 10:48 AM  Lesle Chris, OTR/L [phone] 04/16/14

## 2014-03-23 NOTE — Plan of Care (Signed)
Problem: Phase II Progression Outcomes Goal: Ambulates Outcome: Progressing Goal: Tolerating diet Outcome: Progressing

## 2014-03-23 NOTE — Progress Notes (Signed)
Received prescreen request for inpatient rehab and have reviewed pt's case. Rehab MD order has already been placed and an admission coordinator will follow up with pt after rehab consult is completed.  Thank you.  Nanetta Batty, PT Rehabilitation Admissions Coordinator [phone]

## 2014-03-23 NOTE — Plan of Care (Signed)
Problem: Phase I Progression Outcomes Goal: CMS/Neurovascular status WDL Outcome: Completed/Met Date Met:  03/23/14 Goal: Pain controlled with appropriate interventions Outcome: Completed/Met Date Met:  03/23/14 Goal: Hemodynamically stable Outcome: Completed/Met Date Met:  03/23/14

## 2014-03-23 NOTE — Progress Notes (Signed)
CSW met with pt to assist with d/c planning. She is requesting referral to CIR for rehab following hospital d/c. Pt has been to CIR in the past following surgery. Pt states she has spoken to her [employer] rep, MD, and CIR prior to admission. CSW has confirmed with PA pt's request for CIR referral. Pt understands she must  be accepted by CIR and  [employer] must provide authorization in order to be admitted to [facility] for rehab. CSW offered to assist with SNF placement if pt is unable to got to CIR. Pt declined. " I'll go home ".  CSW will be available to assist with d/c planning, if needed.   Werner Lean [phone]

## 2014-03-23 NOTE — Progress Notes (Signed)
Patient ID: [name], female   DOB: 15-Nov-1960, 53 y.o.   MRN: [id number] Subjective: 1 Day Post-Op Procedure(s) (LRB): RIGHT TOTAL KNEE ARTHROPLASTY (Right) Patient reports pain as mild.    Patient has complaints of R knee pain. Seen in AM rounds for [name]. C/o diffuse itching after receiving pain meds, worst after dilaudid IV, has tolerated oxy without issues in the past  We will start therapy today. Plan is to go to CIR after hospital stay.  Objective: Vital signs in last 24 hours: Temp:  [97.2 F (36.2 C)-98.9 F (37.2 C)] 98.9 F (37.2 C) (12/04 1103) Pulse Rate:  [60-98] 71 (12/04 1103) Resp:  [9-16] 16 (12/04 1103) BP: (92-141)/(44-80) 131/51 mmHg (12/04 1103) SpO2:  [90 %-100 %] 100 % (12/04 1103) Weight:  [121.564 kg (268 lb)] 121.564 kg (268 lb) (12/03 1526)  Intake/Output from previous day:  Intake/Output Summary (Last 24 hours) at 03/23/14 1236 Last data filed at 03/23/14 1100  Gross per 24 hour  Intake 3438.75 ml  Output   2585 ml  Net 853.75 ml    Intake/Output this shift: Total I/O In: 240 [P.O.:240] Out: 0   Labs: Results for orders placed or performed during the hospital encounter of 03/22/14  APTT  Result Value Ref Range   aPTT 39 (H) 24 - 37 seconds  Urinalysis, Routine w reflex microscopic  Result Value Ref Range   Color, Urine YELLOW YELLOW   APPearance CLEAR CLEAR   Specific Gravity, Urine 1.014 1.005 - 1.030   pH 5.5 5.0 - 8.0   Glucose, UA NEGATIVE NEGATIVE mg/dL   Hgb urine dipstick NEGATIVE NEGATIVE   Bilirubin Urine NEGATIVE NEGATIVE   Ketones, ur NEGATIVE NEGATIVE mg/dL   Protein, ur NEGATIVE NEGATIVE mg/dL   Urobilinogen, UA 0.2 0.0 - 1.0 mg/dL   Nitrite NEGATIVE NEGATIVE   Leukocytes, UA NEGATIVE NEGATIVE  CBC  Result Value Ref Range   WBC 7.8 4.0 - 10.5 K/uL   RBC 4.22 3.87 - 5.11 MIL/uL   Hemoglobin 11.3 (L) 12.0 - 15.0 g/dL   HCT 33.4 (L) 36.0 - 46.0 %   MCV 79.1 78.0 - 100.0 fL   MCH 26.8 26.0 - 34.0 pg   MCHC  33.8 30.0 - 36.0 g/dL   RDW 16.1 (H) 11.5 - 15.5 %   Platelets 171 150 - 400 K/uL  Basic metabolic panel  Result Value Ref Range   Sodium 141 137 - 147 mEq/L   Potassium 3.8 3.7 - 5.3 mEq/L   Chloride 105 96 - 112 mEq/L   CO2 25 19 - 32 mEq/L   Glucose, Bld 159 (H) 70 - 99 mg/dL   BUN 12 6 - 23 mg/dL   Creatinine, Ser 0.95 0.50 - 1.10 mg/dL   Calcium 9.2 8.4 - 10.5 mg/dL   GFR calc non Af Amer 66 (L) >90 mL/min   GFR calc Af Amer 77 (L) >90 mL/min   Anion gap 11 5 - 15  Type and screen  Result Value Ref Range   ABO/RH(D) O POS    Antibody Screen NEG    Sample Expiration 03/25/2014     Exam - Neurologically intact ABD soft Neurovascular intact Sensation intact distally Intact pulses distally Dorsiflexion/Plantar flexion intact Incision: dressing C/D/I and no drainage No cellulitis present Compartment soft no calf pain or sign of DVT Dressing - clean, dry, no drainage Motor function intact - moving foot and toes well on exam.  Hemovac pulled without difficulty.  Assessment/Plan: 1 Day  Post-Op Procedure(s) (LRB): RIGHT TOTAL KNEE ARTHROPLASTY (Right)  Advance diet Up with therapy D/C IV fluids Past Medical History  Diagnosis Date  . Hypertension   . Asthma   . Morbid obesity   . PE (pulmonary embolism)   . DVT (deep venous thrombosis)   . GERD (gastroesophageal reflux disease)   . Dysfunctional uterine bleeding   . Headache(784.0)     MIGRAINES IN PAST  . Glaucoma   . Sleep apnea, obstructive     USES C-PAP  . Arthritis     knee     DVT Prophylaxis - Xarelto Protocol- 10mg  in hospital, D/C home on 20mg  pre-admission dose due to hx of DVT/PE, does have filter Weight-Bearing as tolerated to Right leg Keep foley until tomorrow. No vaccines. CIR consult placed, pending approval, recommend due to her multiple medial comorbidities, she did well at CIR following her other TKA D/C dilaudid due to itching, add atarax, switch to morphine IV prn severe  pain Discussed with [name], [name]. 03/23/2014, 12:36 PM

## 2014-03-23 NOTE — Plan of Care (Signed)
Problem: Phase I Progression Outcomes Goal: Dangle or out of bed evening of surgery Outcome: Completed/Met Date Met:  03/23/14

## 2014-03-23 NOTE — Progress Notes (Signed)
Physical Therapy Treatment Patient Details Name: [name] MRN: [id number] DOB: April 21, 1960 Today's Date: 03/23/2014    History of Present Illness s/p R TKA    PT Comments    Pt motivated but continues to struggle with sit to stand from low bed.  Follow Up Recommendations  CIR     Equipment Recommendations  None recommended by PT    Recommendations for Other Services OT consult     Precautions / Restrictions Precautions Precautions: Knee Required Braces or Orthoses: Knee Immobilizer - Right Knee Immobilizer - Right: Discontinue once straight leg raise with < 10 degree lag Restrictions Weight Bearing Restrictions: No    Mobility  Bed Mobility Overal bed mobility: Needs Assistance Bed Mobility: Supine to Sit;Sit to Supine     Supine to sit: Min assist Sit to supine: Mod assist   General bed mobility comments: cues for sequence and use of L LE to self assist  Transfers   Equipment used: Rolling walker (2 wheeled) Transfers: Sit to/from Stand Sit to Stand: Mod assist         General transfer comment: cues for LE management and use of UEs to self assist.  Mod assist from bed at height of home bed and min guard to sit on same with use of bed rail  Ambulation/Gait Ambulation/Gait assistance: Min assist Ambulation Distance (Feet): 49 Feet (twice) Assistive device: Rolling walker (2 wheeled) Gait Pattern/deviations: Step-to pattern;Decreased step length - right;Decreased step length - left;Shuffle;Trunk flexed Gait velocity: decr   General Gait Details: cues for posture, sequence and position from [employer]    Modified Rankin (Stroke Patients Only)       Balance                                    Cognition Arousal/Alertness: Awake/alert Behavior During Therapy: WFL for tasks assessed/performed Overall Cognitive Status: Within Functional Limits for tasks assessed                      Exercises      General Comments        Pertinent Vitals/Pain Pain Assessment: 0-10 Pain Score: 4  Pain Location: R knee Pain Descriptors / Indicators: Aching;Sore Pain Intervention(s): Monitored during session;Limited activity within patient's tolerance;Premedicated before session;Ice applied    Home Living                      Prior Function            PT Goals (current goals can now be found in the care plan section) Acute Rehab PT Goals Patient Stated Goal: get back being independent PT Goal Formulation: With patient Time For Goal Achievement: 03/30/14 Potential to Achieve Goals: Good Progress towards PT goals: Progressing toward goals    Frequency  7X/week    PT Plan Current plan remains appropriate    Co-evaluation             End of Session Equipment Utilized During Treatment: Gait belt;Right knee immobilizer Activity Tolerance: Patient tolerated treatment well Patient left: in bed;with call bell/phone within reach     Time: 1450-1516 PT Time Calculation (min) (ACUTE ONLY): 26 min  Charges:  $Gait Training: 23-37 mins  G Codes:      Irving Bloor April 20, 2014, 3:18 PM

## 2014-03-23 NOTE — Evaluation (Signed)
Physical Therapy Evaluation Patient Details Name: [name] MRN: [id number] DOB: [birth year] Today's Date: 03/23/2014   History of Present Illness  R TKR  Clinical Impression  Pt s/p R TKR presents with decreased R LE strength/ROM, obesity and post op pain limiting functional mobility.     Follow Up Recommendations CIR    Equipment Recommendations  None recommended by PT    Recommendations for Other Services OT consult     Precautions / Restrictions Precautions Precautions: Knee Required Braces or Orthoses: Knee Immobilizer - Right Knee Immobilizer - Right: Discontinue once straight leg raise with < 10 degree lag Restrictions Weight Bearing Restrictions: No      Mobility  Bed Mobility Overal bed mobility: Needs Assistance Bed Mobility: Supine to Sit     Supine to sit: Min assist;Mod assist     General bed mobility comments: pt up in chair  Transfers Overall transfer level: Needs assistance Equipment used: Rolling walker (2 wheeled) Transfers: Sit to/from Stand Sit to Stand: Min guard         General transfer comment: for safety:  one cue for LE placement  Ambulation/Gait Ambulation/Gait assistance: Min assist Ambulation Distance (Feet): 37 Feet Assistive device: Rolling walker (2 wheeled) Gait Pattern/deviations: Step-to pattern;Decreased step length - right;Decreased step length - left;Shuffle;Antalgic Gait velocity: decr   General Gait Details: cues for posture, sequence and position from [employer]            Wheelchair Mobility    Modified Rankin (Stroke Patients Only)       Balance                                             Pertinent Vitals/Pain Pain Assessment: 0-10 Pain Score: 2  Pain Location: L knee Pain Descriptors / Indicators: Sore Pain Intervention(s): Limited activity within patient's tolerance;Monitored during session;Repositioned;Ice applied    Home Living Family/patient expects to be  discharged to:: Private residence Living Arrangements: Non-relatives/Friends             Home Equipment: Bedside commode;Tub bench      Prior Function Level of Independence: Independent with assistive device(s)         Comments: has housemate who can help if needed     Hand Dominance        Extremity/Trunk Assessment   Upper Extremity Assessment: Overall WFL for tasks assessed           Lower Extremity Assessment: RLE deficits/detail RLE Deficits / Details: 2/5 quads with AAROM at knee -10 - 50    Cervical / Trunk Assessment: Normal  Communication   Communication: No difficulties  Cognition Arousal/Alertness: Awake/alert Behavior During Therapy: WFL for tasks assessed/performed Overall Cognitive Status: Within Functional Limits for tasks assessed                      General Comments      Exercises Total Joint Exercises Ankle Circles/Pumps: AROM;10 reps;Supine;Both Quad Sets: AROM;Both;10 reps;Supine Heel Slides: AAROM;Right;10 reps;Supine Straight Leg Raises: AAROM;Right;10 reps;Supine      Assessment/Plan    PT Assessment Patient needs continued PT services  PT Diagnosis Difficulty walking   PT Problem List Decreased strength;Decreased range of motion;Decreased activity tolerance;Decreased mobility;Decreased knowledge of use of DME;Obesity;Pain  PT Treatment Interventions DME instruction;Gait training;Stair training;Functional mobility training;Therapeutic activities;Therapeutic exercise;Patient/family education   PT Goals (Current goals can  be found in the Care Plan section) Acute Rehab PT Goals Patient Stated Goal: get back being independent PT Goal Formulation: With patient Time For Goal Achievement: 03/30/14 Potential to Achieve Goals: Good    Frequency 7X/week   Barriers to discharge        Co-evaluation               End of Session Equipment Utilized During Treatment: Gait belt;Right knee immobilizer Activity  Tolerance: Patient tolerated treatment well;Patient limited by pain Patient left: in chair;with call bell/phone within reach Nurse Communication: Mobility status         Time: 0825-0905 PT Time Calculation (min) (ACUTE ONLY): 40 min   Charges:   PT Evaluation $Initial PT Evaluation Tier I: 1 Procedure PT Treatments $Gait Training: 8-22 mins $Therapeutic Exercise: 8-22 mins   PT G Codes:          Cortland Crehan 03/23/2014, 1:09 PM

## 2014-03-24 LAB — CBC
HCT: 29.4 % — ABNORMAL LOW (ref 36.0–46.0)
Hemoglobin: 9.8 g/dL — ABNORMAL LOW (ref 12.0–15.0)
MCH: 26.5 pg (ref 26.0–34.0)
MCHC: 33.3 g/dL (ref 30.0–36.0)
MCV: 79.5 fL (ref 78.0–100.0)
Platelets: 142 10*3/uL — ABNORMAL LOW (ref 150–400)
RBC: 3.7 MIL/uL — ABNORMAL LOW (ref 3.87–5.11)
RDW: 16.3 % — ABNORMAL HIGH (ref 11.5–15.5)
WBC: 8 10*3/uL (ref 4.0–10.5)

## 2014-03-24 MED ORDER — HYDROCODONE-ACETAMINOPHEN 7.5-325 MG/15ML PO SOLN
ORAL | Status: AC
  Filled 2014-03-24: qty 15

## 2014-03-24 NOTE — Progress Notes (Signed)
Spoke with pt regarding her home cpap unit.  Pt stated she doesn't need any help with it and will place it on herself later when ready for bed.  RT offered to refill water supply in humidity chamber, but pt stated there was plenty in it already.  No frays on cord or obvious defects noted with her home cpap unit.

## 2014-03-24 NOTE — Progress Notes (Signed)
Pt reports she has all her "eye drops at her bedside and she takes them morning and night"

## 2014-03-24 NOTE — Progress Notes (Signed)
Physical Therapy Treatment Patient Details Name: [name] MRN: [id number] DOB: [birth year] Today's Date: 03/24/2014    History of Present Illness s/p R TKA    PT Comments      Follow Up Recommendations  CIR     Equipment Recommendations  None recommended by PT    Recommendations for Other Services OT consult     Precautions / Restrictions Precautions Precautions: Knee Required Braces or Orthoses: Knee Immobilizer - Right Knee Immobilizer - Right: Discontinue once straight leg raise with < 10 degree lag Restrictions Weight Bearing Restrictions: No    Mobility  Bed Mobility Overal bed mobility: Needs Assistance Bed Mobility: Sit to Supine       Sit to supine: Min assist;Mod assist   General bed mobility comments: cues for sequence and use of L LE to self assist  Transfers Overall transfer level: Needs assistance Equipment used: Rolling walker (2 wheeled) Transfers: Sit to/from Stand Sit to Stand: Min assist         General transfer comment: min cues for LE management and use of UEs to self assist.  Min assist to stand from arm chair.  Ambulation/Gait Ambulation/Gait assistance: Min assist;Min guard Ambulation Distance (Feet): 62 Feet Assistive device: Rolling walker (2 wheeled) Gait Pattern/deviations: Step-to pattern;Decreased step length - right;Decreased step length - left;Shuffle;Trunk flexed Gait velocity: decr   General Gait Details: cues for posture, sequence and position from [employer]    Modified Rankin (Stroke Patients Only)       Balance                                    Cognition Arousal/Alertness: Awake/alert Behavior During Therapy: WFL for tasks assessed/performed Overall Cognitive Status: Within Functional Limits for tasks assessed                      Exercises Total Joint Exercises Ankle Circles/Pumps: AROM;10 reps;Supine;Both Quad Sets:  AROM;Both;Supine;20 reps Heel Slides: AAROM;Right;Supine;20 reps Straight Leg Raises: AAROM;Right;Supine;20 reps Goniometric ROM: AAROM R knee -10 -40    General Comments        Pertinent Vitals/Pain Pain Assessment: 0-10 Pain Score: 4  Pain Location: R knee Pain Descriptors / Indicators: Aching;Sore Pain Intervention(s): Limited activity within patient's tolerance;Monitored during session;Premedicated before session;Ice applied    Home Living                      Prior Function            PT Goals (current goals can now be found in the care plan section) Acute Rehab PT Goals Patient Stated Goal: get back being independent PT Goal Formulation: With patient Time For Goal Achievement: 03/30/14 Potential to Achieve Goals: Good Progress towards PT goals: Progressing toward goals    Frequency  7X/week    PT Plan Current plan remains appropriate    Co-evaluation             End of Session Equipment Utilized During Treatment: Gait belt;Right knee immobilizer Activity Tolerance: Patient tolerated treatment well Patient left: in bed;with call bell/phone within reach     Time: 1033-1106 PT Time Calculation (min) (ACUTE ONLY): 33 min  Charges:  $Gait Training: 8-22 mins $Therapeutic Exercise: 8-22 mins  G Codes:      [name] 04-14-14, 12:30 PM

## 2014-03-24 NOTE — Progress Notes (Signed)
   Subjective: 2 Days Post-Op Procedure(s) (LRB): RIGHT TOTAL KNEE ARTHROPLASTY (Right)  Pt resting comfortably in no acute distress Plan to go to CIR once bed available Patient reports pain as mild.  Objective:   VITALS:   Filed Vitals:   03/24/14 0519  BP: 116/66  Pulse: 80  Temp: 98.9 F (37.2 C)  Resp: 16    Right knee incision healing well nv intact No drainage or erythema  LABS  Recent Labs  03/23/14 0435 03/24/14 0423  HGB 11.3* 9.8*  HCT 33.4* 29.4*  WBC 7.8 8.0  PLT 171 142*     Recent Labs  03/23/14 0435  NA 141  K 3.8  BUN 12  CREATININE 0.95  GLUCOSE 159*     Assessment/Plan: 2 Days Post-Op Procedure(s) (LRB): RIGHT TOTAL KNEE ARTHROPLASTY (Right)  Plan to d/c to CIR when available F/u in 2 weeks   [name], MPAS, PA-C  03/24/2014, 7:18 AM

## 2014-03-24 NOTE — Plan of Care (Signed)
Problem: Phase III Progression Outcomes Goal: Incision clean - minimal/no drainage Outcome: Completed/Met Date Met:  03/24/14

## 2014-03-24 NOTE — Progress Notes (Signed)
Physical Therapy Treatment Patient Details Name: [name] MRN: [id number] DOB: 1960/06/16 Today's Date: 2014/04/22    History of Present Illness s/p R TKA    PT Comments      Follow Up Recommendations  CIR     Equipment Recommendations  None recommended by PT    Recommendations for Other Services OT consult     Precautions / Restrictions Precautions Precautions: Knee Required Braces or Orthoses: Knee Immobilizer - Right Knee Immobilizer - Right: Discontinue once straight leg raise with < 10 degree lag Restrictions Weight Bearing Restrictions: No    Mobility  Bed Mobility Overal bed mobility: Needs Assistance Bed Mobility: Sit to Supine;Supine to Sit     Supine to sit: Min assist Sit to supine: Min assist   General bed mobility comments: cues for sequence and use of L LE to self assist  Transfers Overall transfer level: Needs assistance Equipment used: Rolling walker (2 wheeled) Transfers: Sit to/from Stand Sit to Stand: Min assist         General transfer comment: min cues for LE management and use of UEs to self assist.   Ambulation/Gait Ambulation/Gait assistance: Min guard Ambulation Distance (Feet): 50 Feet (50' twice and 18' to bathroom) Assistive device: Rolling walker (2 wheeled) Gait Pattern/deviations: Step-to pattern;Decreased step length - right;Decreased step length - left;Shuffle;Trunk flexed Gait velocity: decr   General Gait Details: cues for posture, sequence and position from [employer]    Modified Rankin (Stroke Patients Only)       Balance                                    Cognition Arousal/Alertness: Awake/alert Behavior During Therapy: WFL for tasks assessed/performed Overall Cognitive Status: Within Functional Limits for tasks assessed                      Exercises      General Comments        Pertinent Vitals/Pain Pain Assessment:  0-10 Pain Score: 3  Pain Location: R knee Pain Descriptors / Indicators: Aching;Sore Pain Intervention(s): Limited activity within patient's tolerance;Monitored during session;Premedicated before session;Ice applied    Home Living                      Prior Function            PT Goals (current goals can now be found in the care plan section) Acute Rehab PT Goals Patient Stated Goal: get back being independent PT Goal Formulation: With patient Time For Goal Achievement: 03/30/14 Potential to Achieve Goals: Good Progress towards PT goals: Progressing toward goals    Frequency  7X/week    PT Plan Current plan remains appropriate    Co-evaluation             End of Session Equipment Utilized During Treatment: Gait belt;Right knee immobilizer Activity Tolerance: Patient tolerated treatment well Patient left: in bed;with call bell/phone within reach     Time: 1440-1504 PT Time Calculation (min) (ACUTE ONLY): 24 min  Charges:  $Gait Training: 23-37 mins                    G Codes:      [name] Apr 22, 2014, 4:35 PM

## 2014-03-24 NOTE — Care Management (Signed)
NCM spoke with patient. She prefers to go to inpatient rehab. Spoke with her insurance company. States she has coverage. Lives at home with a house mate. Awaiting CIR approval. Venita Sheffield RN BSN CCM [phone]

## 2014-03-24 NOTE — Discharge Summary (Signed)
Physician Discharge Summary   Patient ID: [name] MRN: [id number] DOB/AGE: [birth year] 55 y.o.  Admit date: 03/22/2014 Discharge date: 03/24/2014  Admission Diagnoses:  Principal Problem:   Right knee DJD   Discharge Diagnoses:  Same   Surgeries: Procedure(s): RIGHT TOTAL KNEE ARTHROPLASTY on 03/22/2014   Consultants: PT/OT  Discharged Condition: Stable  Hospital Course: [name] is an 53 y.o. female who was admitted 03/22/2014 with a chief complaint of No chief complaint on file. , and found to have a diagnosis of Right knee DJD.  They were brought to the operating room on 03/22/2014 and underwent the above named procedures.    The patient had an uncomplicated hospital course and was stable for discharge.  Recent vital signs:  Filed Vitals:   03/24/14 0519  BP: 116/66  Pulse: 80  Temp: 98.9 F (37.2 C)  Resp: 16    Recent laboratory studies:  Results for orders placed or performed during the hospital encounter of 03/22/14  APTT  Result Value Ref Range   aPTT 39 (H) 24 - 37 seconds  Urinalysis, Routine w reflex microscopic  Result Value Ref Range   Color, Urine YELLOW YELLOW   APPearance CLEAR CLEAR   Specific Gravity, Urine 1.014 1.005 - 1.030   pH 5.5 5.0 - 8.0   Glucose, UA NEGATIVE NEGATIVE mg/dL   Hgb urine dipstick NEGATIVE NEGATIVE   Bilirubin Urine NEGATIVE NEGATIVE   Ketones, ur NEGATIVE NEGATIVE mg/dL   Protein, ur NEGATIVE NEGATIVE mg/dL   Urobilinogen, UA 0.2 0.0 - 1.0 mg/dL   Nitrite NEGATIVE NEGATIVE   Leukocytes, UA NEGATIVE NEGATIVE  CBC  Result Value Ref Range   WBC 7.8 4.0 - 10.5 K/uL   RBC 4.22 3.87 - 5.11 MIL/uL   Hemoglobin 11.3 (L) 12.0 - 15.0 g/dL   HCT 33.4 (L) 36.0 - 46.0 %   MCV 79.1 78.0 - 100.0 fL   MCH 26.8 26.0 - 34.0 pg   MCHC 33.8 30.0 - 36.0 g/dL   RDW 16.1 (H) 11.5 - 15.5 %   Platelets 171 150 - 400 K/uL  Basic metabolic panel  Result Value Ref Range   Sodium 141 137 - 147 mEq/L   Potassium 3.8 3.7 -  5.3 mEq/L   Chloride 105 96 - 112 mEq/L   CO2 25 19 - 32 mEq/L   Glucose, Bld 159 (H) 70 - 99 mg/dL   BUN 12 6 - 23 mg/dL   Creatinine, Ser 0.95 0.50 - 1.10 mg/dL   Calcium 9.2 8.4 - 10.5 mg/dL   GFR calc non Af Amer 66 (L) >90 mL/min   GFR calc Af Amer 77 (L) >90 mL/min   Anion gap 11 5 - 15  CBC  Result Value Ref Range   WBC 8.0 4.0 - 10.5 K/uL   RBC 3.70 (L) 3.87 - 5.11 MIL/uL   Hemoglobin 9.8 (L) 12.0 - 15.0 g/dL   HCT 29.4 (L) 36.0 - 46.0 %   MCV 79.5 78.0 - 100.0 fL   MCH 26.5 26.0 - 34.0 pg   MCHC 33.3 30.0 - 36.0 g/dL   RDW 16.3 (H) 11.5 - 15.5 %   Platelets 142 (L) 150 - 400 K/uL  Type and screen  Result Value Ref Range   ABO/RH(D) O POS    Antibody Screen NEG    Sample Expiration 03/25/2014     Discharge Medications:     Medication List    TAKE these medications  docusate sodium 100 MG capsule  Commonly known as:  COLACE  Take 1 capsule (100 mg total) by mouth 2 (two) times daily as needed for mild constipation.     methocarbamol 500 MG tablet  Commonly known as:  ROBAXIN  Take 1 tablet (500 mg total) by mouth 3 (three) times daily.     oxyCODONE 5 MG immediate release tablet  Commonly known as:  Oxy IR/ROXICODONE  1-2 po q4-6 prn pain      ASK your doctor about these medications        acetaminophen 500 MG tablet  Commonly known as:  TYLENOL  Take 1,000 mg by mouth every 4 (four) hours as needed for moderate pain.     ALPRAZolam 0.25 MG tablet  Commonly known as:  XANAX  Take 0.25 mg by mouth at bedtime as needed for sleep.     b complex vitamins tablet  Take 1 tablet by mouth every morning.     beclomethasone 80 MCG/ACT inhaler  Commonly known as:  QVAR  Inhale 2 puffs into the lungs 2 (two) times daily.     bimatoprost 0.01 % Soln  Commonly known as:  LUMIGAN  Place 1 drop into both eyes at bedtime.     brimonidine 0.1 % Soln  Commonly known as:  ALPHAGAN P  Place 1 drop into both eyes 2 (two) times daily.     brinzolamide 1 %  ophthalmic suspension  Commonly known as:  AZOPT  Place 1 drop into both eyes 2 (two) times daily.     CALTRATE 600+D PLUS MINERALS PO  Take 1 tablet by mouth 3 (three) times daily.     celecoxib 200 MG capsule  Commonly known as:  CELEBREX  Take 200 mg by mouth every morning.     ciclesonide 160 MCG/ACT inhaler  Commonly known as:  ALVESCO  Inhale 2 puffs into the lungs 2 (two) times daily.     cyclobenzaprine 10 MG tablet  Commonly known as:  FLEXERIL  Take 10 mg by mouth at bedtime.     DHA PO  Take 1 tablet by mouth 2 (two) times daily.     diclofenac sodium 1 % Gel  Commonly known as:  VOLTAREN  Apply 1 application topically 4 (four) times daily as needed (for joint pain.).     diltiazem 240 MG 24 hr capsule  Commonly known as:  DILACOR XR  Take 240 mg by mouth at bedtime.     esomeprazole 40 MG capsule  Commonly known as:  NEXIUM  Take 40 mg by mouth daily before breakfast.     fish oil-omega-3 fatty acids 1000 MG capsule  Take 1 g by mouth 2 (two) times daily.     HYDROCODONE-ACETAMINOPHEN PO  Take 15 mLs by mouth 4 (four) times daily as needed (pain). (7.5-325 mg liquid)     iron polysaccharides 150 MG capsule  Commonly known as:  NIFEREX  Take 150 mg by mouth 2 (two) times daily.     levalbuterol 1.25 MG/3ML nebulizer solution  Commonly known as:  XOPENEX  Take 1.25 mg by nebulization every 6 (six) hours as needed for wheezing or shortness of breath.     loratadine 10 MG tablet  Commonly known as:  CLARITIN  Take 10 mg by mouth at bedtime.     loratadine-pseudoephedrine 5-120 MG per tablet  Commonly known as:  CLARITIN-D 12-hour  Take 1 tablet by mouth 2 (two) times daily.     magnesium  chloride 64 MG Tbec SR tablet  Commonly known as:  SLOW-MAG  Take 1-2 tablets by mouth 2 (two) times daily. Patient takes 2 tablet every morning and 1 tablet with dinner     mometasone 50 MCG/ACT nasal spray  Commonly known as:  NASONEX  Place 2 sprays into the  nose daily as needed (allergies).     montelukast 10 MG tablet  Commonly known as:  SINGULAIR  Take 10 mg by mouth at bedtime.     multivitamin tablet  Take 1 tablet by mouth 2 (two) times daily.     niacin 500 MG tablet  Take 500 mg by mouth daily with breakfast. Needs to be flush free per patient     PATADAY 0.2 % Soln  Generic drug:  Olopatadine HCl  Apply 1 drop to eye 2 (two) times daily as needed.     potassium chloride SA 20 MEQ tablet  Commonly known as:  K-DUR,KLOR-CON  Take 20-40 mEq by mouth 2 (two) times daily. 40 meq with breakfast and 20 meq with dinner     pyridOXINE 100 MG tablet  Commonly known as:  VITAMIN B-6  Take 100 mg by mouth every morning.     quiNINE 324 MG capsule  Commonly known as:  QUALAQUIN  Take 324 mg by mouth at bedtime.     sodium chloride 0.65 % Soln nasal spray  Commonly known as:  OCEAN  Place 2 sprays into both nostrils 2 (two) times daily.     SYMBICORT 160-4.5 MCG/ACT inhaler  Generic drug:  budesonide-formoterol  Inhale 2 puffs into the lungs 2 (two) times daily.     traMADol 50 MG tablet  Commonly known as:  ULTRAM  Take 1 tablet (50 mg total) by mouth every 6 (six) hours as needed.     valsartan-hydrochlorothiazide 80-12.5 MG per tablet  Commonly known as:  DIOVAN-HCT  Take 1 tablet by mouth every morning.     Vitamin B-12 2500 MCG Subl  Place 2 tablets under the tongue at bedtime.     vitamin C 1000 MG tablet  Take 1,000 mg by mouth every morning.     Vitamin D (Ergocalciferol) 50000 UNITS Caps capsule  Commonly known as:  DRISDOL  Take 50,000 Units by mouth every 7 (seven) days. Patient takes on Saturday     XARELTO 20 MG Tabs tablet  Generic drug:  rivaroxaban  Take 20 mg by mouth at bedtime.        Diagnostic Studies: Dg Knee 2 Views Right  03/22/2014   CLINICAL DATA:  DJD.  EXAM: RIGHT KNEE - 1-2 VIEW  COMPARISON:  MRI 06/15/2005.  FINDINGS: Severe tricompartment degenerative change present. Degenerative  changes are most prominent along the lateral and patellofemoral compartments. Very prominent osteophytes noted patella femoral joint space. No acute bony abnormality.  IMPRESSION: Severe tricompartment degenerative change with degenerative changes most severe scratch about the lateral compartment and patellofemoral compartment. Very large osteophytes are present. Severe loss of joint space present.   Electronically Signed   By: Marcello Moores  Register   On: 03/22/2014 10:16   Dg Knee Right Port  03/22/2014   CLINICAL DATA:  Status post right knee replacement  EXAM: PORTABLE RIGHT KNEE - 1-2 VIEW  COMPARISON:  03/22/2014  FINDINGS: Two views of the right knee submitted. There is right knee prosthesis in anatomic alignment. Postsurgical changes with postsurgical drain in place. Small amount of periarticular soft tissue air.  IMPRESSION: Right knee prosthesis in anatomic alignment.  Electronically Signed   By: Lahoma Crocker M.D.   On: 03/22/2014 14:12    Disposition: 01-Home or Self Care        Follow-up Information    Follow up with BEANE,JEFFREY C, MD In 2 weeks.   Specialty:  Orthopedic Surgery   Why:  For suture removal   Contact information:   [address] [place] [address] [phone]        Signed: Ventura Bruns 03/24/2014, 7:20 AM

## 2014-03-24 NOTE — Progress Notes (Signed)
CARE MANAGEMENT NOTE 03/24/2014  Patient:  [name], [name]   Account Number:  [id number]  Date Initiated:  03/22/2014  Documentation initiated by:  DAVIS,RHONDA  Subjective/Objective Assessment:   total knee     Action/Plan:   home with hhc  [place] arranged with Ar[place] Go   Anticipated DC Date:  03/25/2014   Anticipated DC Plan:  IP REHAB FACILITY  In-house referral  NA      DC Planning Services  CM consult      [facility] Choice  HOME HEALTH   Choice offered to / List presented to:  C-1 Patient   DME arranged  NA      DME agency  NA     [place] arranged  NA      [place] agency  NA   Status of service:  Completed, signed off Medicare Important Message given?  NO (If response is "NO", the following Medicare IM given date fields will be blank) Date Medicare IM given:   Medicare IM given by:   Date Additional Medicare IM given:   Additional Medicare IM given by:    Discharge Disposition:    Per UR Regulation:  Reviewed for med. necessity/level of care/duration of stay  If discussed at [place] of Stay Meetings, dates discussed:    Comments:  03/24/2014  9:36 AM  NCM spoke with patient. She prefers to go to inpatient rehab. Spoke with her insurance company. States she has coverage. Lives at home with a house mate. Awaiting CIR approval. Venita Sheffield RN BSN CCM [phone]      Suanne Marker Davis,RN,BSN,CCM

## 2014-03-24 NOTE — Plan of Care (Signed)
Problem: Consults Goal: Total Joint Replacement Patient Education See Patient Education Module for education specifics.  Outcome: Completed/Met Date Met:  03/24/14 Goal: Diagnosis- Total Joint Replacement Outcome: Completed/Met Date Met:  03/24/14 Primary Total Knee RIGHT Goal: Skin Care Protocol Initiated - if Braden Score 18 or less If consults are not indicated, leave blank or document N/A  Outcome: Not Applicable Date Met:  28/97/91 Goal: Nutrition Consult-if indicated Outcome: Not Applicable Date Met:  50/41/36 Goal: Diabetes Guidelines if Diabetic/Glucose > 140 If diabetic or lab glucose is > 140 mg/dl - Initiate Diabetes/Hyperglycemia Guidelines & Document Interventions  Outcome: Not Applicable Date Met:  43/83/77  Problem: Phase I Progression Outcomes Goal: Initial discharge plan identified Outcome: Completed/Met Date Met:  03/24/14 Goal: Other Phase I Outcomes/Goals Outcome: Not Applicable Date Met:  93/96/88  Problem: Phase II Progression Outcomes Goal: Ambulates Outcome: Completed/Met Date Met:  03/24/14 Goal: Tolerating diet Outcome: Completed/Met Date Met:  03/24/14 Goal: Discharge plan established Outcome: Completed/Met Date Met:  03/24/14 Goal: Other Phase II Outcomes/Goals Outcome: Not Applicable Date Met:  64/84/72  Problem: Phase III Progression Outcomes Goal: Pain controlled on oral analgesia Outcome: Completed/Met Date Met:  03/24/14 Goal: Ambulates Outcome: Completed/Met Date Met:  03/24/14

## 2014-03-25 LAB — CBC
HCT: 28.7 % — ABNORMAL LOW (ref 36.0–46.0)
Hemoglobin: 9.8 g/dL — ABNORMAL LOW (ref 12.0–15.0)
MCH: 26.8 pg (ref 26.0–34.0)
MCHC: 34.1 g/dL (ref 30.0–36.0)
MCV: 78.6 fL (ref 78.0–100.0)
Platelets: 128 10*3/uL — ABNORMAL LOW (ref 150–400)
RBC: 3.65 MIL/uL — ABNORMAL LOW (ref 3.87–5.11)
RDW: 16.2 % — ABNORMAL HIGH (ref 11.5–15.5)
WBC: 6.3 10*3/uL (ref 4.0–10.5)

## 2014-03-25 NOTE — Progress Notes (Signed)
Pt expressed concern that she had not received "Calcium Citrate " while in the hospital. Reviewed PTA medlist which confirmed this as a home med. Please include this on her RX list for CIR if she is transferred on 03/26/14. She is also aware that she will need to surrender her home medications which she has kept at bedside on Ortho department here at [facility] to RN when she arrives there or allow pharmacy to dispense per policy while in CIR. No questions.

## 2014-03-25 NOTE — Progress Notes (Signed)
Physical Therapy Treatment Patient Details Name: [name] MRN: [id number] DOB: 07/19/1960 Today's Date: 2014-03-27    History of Present Illness s/p R TKA    PT Comments    Steady progress  Follow Up Recommendations  CIR     Equipment Recommendations  None recommended by PT    Recommendations for Other Services OT consult     Precautions / Restrictions Precautions Precautions: Knee Required Braces or Orthoses: Knee Immobilizer - Right Knee Immobilizer - Right: Discontinue once straight leg raise with < 10 degree lag Restrictions Weight Bearing Restrictions: No    Mobility  Bed Mobility               General bed mobility comments: OOB with RN  Transfers Overall transfer level: Needs assistance Equipment used: Rolling walker (2 wheeled) Transfers: Sit to/from Stand Sit to Stand: Min assist         General transfer comment: min cues for LE management and use of UEs to self assist.   Ambulation/Gait Ambulation/Gait assistance: Min assist;Min guard Ambulation Distance (Feet): 62 Feet (twice) Assistive device: Rolling walker (2 wheeled) Gait Pattern/deviations: Step-to pattern;Decreased step length - right;Decreased step length - left;Trunk flexed;Shuffle Gait velocity: decr   General Gait Details: cues for posture, sequence and position from [employer]            Wheelchair Mobility    Modified Rankin (Stroke Patients Only)       Balance                                    Cognition Arousal/Alertness: Awake/alert Behavior During Therapy: WFL for tasks assessed/performed Overall Cognitive Status: Within Functional Limits for tasks assessed                      Exercises Total Joint Exercises Ankle Circles/Pumps: AROM;Supine;Both;15 reps Quad Sets: AROM;Both;Supine;20 reps Heel Slides: AAROM;Right;Supine;20 reps Straight Leg Raises: AAROM;Right;Supine;20 reps Goniometric ROM: AAROM R knee -10 - 60     General Comments        Pertinent Vitals/Pain Pain Assessment: 0-10 Pain Score: 3  Pain Location: R knee Pain Descriptors / Indicators: Aching;Sore Pain Intervention(s): Limited activity within patient's tolerance;Monitored during session;Premedicated before session;Ice applied    Home Living                      Prior Function            PT Goals (current goals can now be found in the care plan section) Acute Rehab PT Goals Patient Stated Goal: get back being independent PT Goal Formulation: With patient Time For Goal Achievement: 03/30/14 Potential to Achieve Goals: Good Progress towards PT goals: Progressing toward goals    Frequency  7X/week    PT Plan Current plan remains appropriate    Co-evaluation             End of Session Equipment Utilized During Treatment: Gait belt;Right knee immobilizer Activity Tolerance: Patient tolerated treatment well Patient left: in chair;with call bell/phone within reach     Time: 2202-5427 PT Time Calculation (min) (ACUTE ONLY): 39 min  Charges:  $Gait Training: 23-37 mins $Therapeutic Exercise: 8-22 mins                    G Codes:      Rasheem Figiel 2014-03-27, 1:05 PM

## 2014-03-25 NOTE — Progress Notes (Signed)
Spoke with pt regarding her home cpap unit. Pt stated she doesn't need any help with it and will place it on herself later when ready for bed. No frays on cord or obvious defects noted with her home cpap unit.

## 2014-03-25 NOTE — Progress Notes (Signed)
   Subjective: 3 Days Post-Op Procedure(s) (LRB): RIGHT TOTAL KNEE ARTHROPLASTY (Right)  Pt c/o mild pain to right quad and knee but seems to be progressing well Denies any new issues or symptoms Therapy going well Awaiting approval for CIR Patient reports pain as mild.  Objective:   VITALS:   Filed Vitals:   03/25/14 0614  BP: 113/51  Pulse: 75  Temp: 98.7 F (37.1 C)  Resp: 18    Right knee incision healing well aquacel in place nv intact distally No rashes or edema  LABS  Recent Labs  03/23/14 0435 03/24/14 0423 03/25/14 0446  HGB 11.3* 9.8* 9.8*  HCT 33.4* 29.4* 28.7*  WBC 7.8 8.0 6.3  PLT 171 142* 128*     Recent Labs  03/23/14 0435  NA 141  K 3.8  BUN 12  CREATININE 0.95  GLUCOSE 159*     Assessment/Plan: 3 Days Post-Op Procedure(s) (LRB): RIGHT TOTAL KNEE ARTHROPLASTY (Right) Acute blood loss anemia - asymptomatic currently Plan for d/c to CIR once approved by CIR and insurance Continue PT/OT Pain control as needed    [name], MPAS, PA-C  03/25/2014, 9:16 AM

## 2014-03-26 ENCOUNTER — Encounter (HOSPITAL_COMMUNITY): Payer: Self-pay | Admitting: Specialist

## 2014-03-26 MED ORDER — DIPHENHYDRAMINE HCL 12.5 MG/5ML PO ELIX: 12.5000 mg | ORAL_SOLUTION | ORAL | Status: DC | PRN

## 2014-03-26 MED ORDER — TRAMADOL HCL 50 MG PO TABS: 50.0000 mg | ORAL_TABLET | Freq: Four times a day (QID) | ORAL | Status: DC | PRN

## 2014-03-26 NOTE — Progress Notes (Signed)
Subjective: 4 Days Post-Op Procedure(s) (LRB): RIGHT TOTAL KNEE ARTHROPLASTY (Right) Patient reports pain as mild.  Reports 1 pain pill at a time makes her much less itchy and still controls the pain. Hoping for d/c today awaiting CIR approval. Pain well controlled. No other c/o.  Objective: Vital signs in last 24 hours: Temp:  [98 F (36.7 C)-98.8 F (37.1 C)] 98.8 F (37.1 C) (12/07 0619) Pulse Rate:  [74-94] 83 (12/07 0619) Resp:  [16-18] 16 (12/07 0619) BP: (122-141)/(57-69) 141/69 mmHg (12/07 0619) SpO2:  [98 %-100 %] 100 % (12/07 0619)  Intake/Output from previous day: 12/06 0701 - 12/07 0700 In: 1920 [P.O.:1920] Out: 250 [Urine:250] Intake/Output this shift:     Recent Labs  03/24/14 0423 03/25/14 0446  HGB 9.8* 9.8*    Recent Labs  03/24/14 0423 03/25/14 0446  WBC 8.0 6.3  RBC 3.70* 3.65*  HCT 29.4* 28.7*  PLT 142* 128*   No results for input(s): NA, K, CL, CO2, BUN, CREATININE, GLUCOSE, CALCIUM in the last 72 hours. No results for input(s): LABPT, INR in the last 72 hours.  Neurologically intact ABD soft Neurovascular intact Sensation intact distally Intact pulses distally Dorsiflexion/Plantar flexion intact Incision: dressing C/D/I and no drainage No cellulitis present Compartment soft no sign of DVT  Assessment/Plan: 4 Days Post-Op Procedure(s) (LRB): RIGHT TOTAL KNEE ARTHROPLASTY (Right) Advance diet Up with therapy D/C IV fluids  Discussed D/C instructions Plan D/C to CIR today, awaiting approval Will discuss with Dr. Tonita Cong Follow up in office 10-14 days post-op for suture removal and xrays  Jenai Scaletta M. 03/26/2014, 7:52 AM

## 2014-03-26 NOTE — Progress Notes (Signed)
Physical Therapy Treatment Patient Details Name: [name] MRN: [id number] DOB: 1960/06/07 Today's Date: 03/26/2014    History of Present Illness s/p R TKA    PT Comments    POD # 4 am session pt OOB in recliner.  Assisted with amb in hallway then returned to room.  Attempted TE's however pt requested that she receive pain meds first.  Tx shortened by pt.  Follow Up Recommendations  CIR     Equipment Recommendations  None recommended by PT    Recommendations for Other Services       Precautions / Restrictions Precautions Precautions: Knee Precaution Comments: pt able to perform 10 active SLR Knee Immobilizer - Right: Discontinue once straight leg raise with < 10 degree lag Restrictions Weight Bearing Restrictions: No    Mobility  Bed Mobility               General bed mobility comments: OOB in recliner  Transfers Overall transfer level: Needs assistance Equipment used: Rolling walker (2 wheeled) Transfers: Sit to/from Stand Sit to Stand: Min guard         General transfer comment: increased time  Ambulation/Gait Ambulation/Gait assistance: Min guard Ambulation Distance (Feet): 85 Feet Assistive device: Rolling walker (2 wheeled) Gait Pattern/deviations: Step-to pattern;Decreased stance time - right Gait velocity: decreased      Stairs            Wheelchair Mobility    Modified Rankin (Stroke Patients Only)       Balance                                    Cognition                            Exercises      General Comments        Pertinent Vitals/Pain Pain Assessment: 0-10 Pain Score: 8  Pain Location: 3/10 at begining then increased to 8/10 R knee pain/  pain meds requested Pain Descriptors / Indicators: Aching;Sore Pain Intervention(s): Monitored during session;Repositioned;Limited activity within patient's tolerance    Home Living                      Prior Function           PT Goals (current goals can now be found in the care plan section) Progress towards PT goals: Progressing toward goals    Frequency  7X/week    PT Plan      Co-evaluation             End of Session Equipment Utilized During Treatment: Gait belt Activity Tolerance: Patient limited by pain Patient left: in chair;with call bell/phone within reach     Time: 8756-4332 PT Time Calculation (min) (ACUTE ONLY): 15 min  Charges:  $Gait Training: 8-22 mins                    G Codes:      Rica Koyanagi  PTA [facility]  Acute  Rehab Pager      [phone]

## 2014-03-26 NOTE — Progress Notes (Signed)
Physical Therapy Treatment Patient Details Name: [name] Sites MRN: [id number] DOB: October 28, 1960 Today's Date: 03/26/2014    History of Present Illness s/p R TKA    PT Comments    Patient expresses concern for  Not going to rehab. Reviewed step verbally as pt declined to practice.    Follow Up Recommendations  Home health PT;Supervision/Assistance - 24 hour     Equipment Recommendations  None recommended by PT    Recommendations for Other Services       Precautions / Restrictions Precautions Precautions: Fall    Mobility  Bed Mobility               General bed mobility comments: OOB in recliner  Transfers Overall transfer level: Needs assistance Equipment used: Rolling walker (2 wheeled) Transfers: Sit to/from Stand Sit to Stand: Supervision         General transfer comment: increased time  Ambulation/Gait Ambulation/Gait assistance: Supervision Ambulation Distance (Feet): 20 Feet Assistive device: Rolling walker (2 wheeled) Gait Pattern/deviations: Step-to pattern;Trunk flexed     General Gait Details: cues for posture, sequence and position from RW, x 2    Stairs Stairs: Yes       General stair comments: verbally reviewed 1 step forward and backward, encouraged patient to wear KI to get into home, offered x 3 to practice  1 step, pt. declined.  Wheelchair Mobility    Modified Rankin (Stroke Patients Only)       Balance                                    Cognition Arousal/Alertness: Awake/alert Behavior During Therapy:  (tearful about not going to rehab) Overall Cognitive Status: Within Functional Limits for tasks assessed                      Exercises Total Joint Exercises Ankle Circles/Pumps: AROM;Supine;Both;15 reps Quad Sets: AROM;Both;Supine;20 reps Heel Slides: AAROM;Right;Supine;20 reps Hip ABduction/ADduction: AROM;Right;10 reps Straight Leg Raises: AAROM;Right;Supine;20 reps Long Arc Quad:  AROM;Right;10 reps;Seated Goniometric ROM: 10-80 AAROM R    General Comments        Pertinent Vitals/Pain Pain Assessment: No/denies pain (sitting) Pain Score: 5  Pain Location: R knee,  Pain Descriptors / Indicators: Aching;Tender Pain Intervention(s): Premedicated before session;Ice applied    Home Living                      Prior Function            PT Goals (current goals can now be found in the care plan section) Acute Rehab PT Goals Patient Stated Goal: get back being independent Progress towards PT goals: Progressing toward goals    Frequency       PT Plan Discharge plan needs to be updated    Co-evaluation             End of Session   Activity Tolerance: Patient tolerated treatment well Patient left: in chair;with call bell/phone within reach     Time: 2993-7169 PT Time Calculation (min) (ACUTE ONLY): 38 min  Charges:  $Gait Training: 8-22 mins $Therapeutic Exercise: 8-22 mins $Self Care/Home Management: 8-22                    G Codes:      Claretha Cooper 03/26/2014, 5:40 PM Tresa Endo PT [phone]

## 2014-03-26 NOTE — Progress Notes (Signed)
Rehab admissions - Evaluated for possible admission.  I spoke with patient last week.  I have called state [employer] carrier and I have faxed information to them requesting acute inpatient rehab admission.  I hope to hear back from insurance case manager today.  Will follow up with all once I received an insurance determination.  Call me for questions.  #[phone]

## 2014-03-26 NOTE — Progress Notes (Signed)
CSW spoke to pt to assist with d/c planning. [employer] will not authorized CIR admission. Pt continues to decline SNF placement. Pt plans to d/c home 12/8. RNCM to assist with d/c planning.  Werner Lean LCSW [phone]

## 2014-03-26 NOTE — Progress Notes (Signed)
Rehab admissions - I have received a denial for acute inpatient rehab admission from [facility] state.  I have notified the social worker, case Freight forwarder, and Physiological scientist.  Patient tells me that she does not want to go to a SNF.  Call me for questions.  #[phone]

## 2014-03-26 NOTE — Progress Notes (Signed)
Occupational Therapy Treatment Patient Details Name: [name] MRN: [id number] DOB: 09-Jun-1960 Today's Date: 03/26/2014    History of present illness s/p R TKA   OT comments  Majority of session spent trouble shooting DC plan . Left message for SW and spoke with RN  Follow Up Recommendations  SNF;Supervision - Intermittent;Home health OT (pt thinks there is not in network SNF in [place])    Equipment Recommendations  None recommended by OT       Precautions / Restrictions Precautions Precautions: Knee Precaution Comments: pt able to perform 10 active SLR Knee Immobilizer - Right: Discontinue once straight leg raise with < 10 degree lag Restrictions Weight Bearing Restrictions: No       Mobility Bed Mobility               General bed mobility comments: OOB in recliner  Transfers Overall transfer level: Needs assistance Equipment used: Rolling walker (2 wheeled) Transfers: Sit to/from Stand Sit to Stand: Min guard         General transfer comment: increased time and encouragement        ADL                                         General ADL Comments: Majority of session spent trouble shooting DC plan. Pt upset regarding rehab denial. OT called SW regarding pts perception of SNF and [employer].   Pt did not want to practice any ADL activity .  Will have OT see pt in morning. Pt agrees      Vision          wears glasses                  Cognition   Behavior During Therapy: WFL for tasks assessed/performed Overall Cognitive Status: Within Functional Limits for tasks assessed                               General Comments      Pertinent Vitals/ Pain       Pain Assessment: No/denies pain (sitting) Pain Score: 8  Pain Location: 3/10 at begining then increased to 8/10 R knee pain/  pain meds requested Pain Descriptors / Indicators: Aching;Sore Pain Intervention(s): Monitored during  session;Repositioned;Limited activity within patient's tolerance  Home Living          pt has a housemate.  Friends will A with grocery shopping and IADL activity                                        Progress Toward Goals  OT Goals(current goals can now be found in the care plan section)  Progress towards OT goals: Progressing toward goals  Acute Rehab OT Goals Patient Stated Goal: get back being independent  Plan Discharge plan needs to be updated       End of Session Equipment Utilized During Treatment: Rolling walker   Activity Tolerance Patient tolerated treatment well   Patient Left in chair;with call bell/phone within reach   Nurse Communication          Time: 7829-5621 OT Time Calculation (min): 23 min  Charges: OT General Charges $OT Visit: 1 Procedure OT Treatments $Self Care/Home Management : 23-37  mins  Storie Heffern, Thereasa Parkin 03/26/2014, 4:13 PM

## 2014-03-27 ENCOUNTER — Encounter (HOSPITAL_COMMUNITY): Payer: Self-pay | Admitting: Specialist

## 2014-03-27 NOTE — Op Note (Signed)
NAMEREIGHAN, Erin NO.:  [id number]  MEDICAL RECORD NO.:  [id number]  LOCATION:                                 FACILITY:  PHYSICIAN:  Susa Day, M.D.    DATE OF BIRTH:  November 23, 1960  DATE OF PROCEDURE:  03/22/2014 DATE OF DISCHARGE:  03/26/2014                              OPERATIVE REPORT   PREOPERATIVE DIAGNOSES:  End-stage osteoarthrosis, degenerative joint disease of the right knee.  Also elevated BMI.  POSTOPERATIVE DIAGNOSES:  End-stage osteoarthrosis, degenerative joint disease of the right knee.  Also elevated BMI.  PROCEDURE PERFORMED:  Right total knee arthroplasty utilizing DePuy rotating platform, 4 femur, 3 revision tray, tibia 12.5 insert 38 patella.  HISTORY:  A 53 year old, bone-on-bone arthrosis, elevated BMI, slight valgus deformity, indicated for replacement of the degenerative joint. Risks and benefits discussed including bleeding, infection, damage to neurovascular structures, DVT, PE, anesthetic complications, etc.  TECHNIQUE:  With the patient in supine position, after induction of adequate general anesthesia, 1.5 g of vancomycin, the right lower extremity was prepped, draped, and exsanguinated with thigh tourniquet inflated to 300 mmHg.  Midline incision was then made over the knee. Full-thickness flaps were developed.  Median parapatellar arthrotomy was performed.  Minimal elevation medially, everted the patella, flexed the knee.  Tricompartmental osteoarthrosis was noted both medially and laterally, low point was posterolaterally.  Removed osteophytes with a rongeur.  Remnants of the medial and lateral menisci were removed as well.  Step drill utilized to enter the femoral canal, was irrigated, 5 degree right was utilized, was pinned, and 10 off the distal femur. Distal cut was performed.  We sized the femur off the anterior cortex to a 4.  This was pinned and 3 degrees of external rotation.  We performed anterior and  posterior chamfer cuts.  Then, subluxed the tibia, measured 2 of the posterolateral corner, bisected the ankle.  The external alignment guide parallel to the shaft, slight slope pinned and performed our cut with an oscillating saw.  Flexion and extension gaps were equivalent.  Removed an osteophyte posteriorly.  Osteophytes removed from the patella, subluxed the tibia, sized it to a 3 just to the medial aspect of the tibial tubercle, drilled it centrally and placed our tray and then used our punch guide.  We placed a trial tibia and then turned our attention towards completing the femurs bisected with a block box cut guide.  This cut was performed with the trial femur 10 insert.  It was slightly tight in flexion and extension.  The patella was everted and measured to a 23 with plane 9.5 from this utilizing the patellar guide, measured at 38 and drilled our PEG holes medially and placed a trial patella.  We had excellent patellofemoral tracking.  All the trials were then removed.  We used pulsatile lavage.  Flexed the knee, removed any residual osteophytes, subluxed the tibia, put a cement restrictor in the tibia, mixed the cement on back table injecting the proximal tibia.  We used our permanent 3 tray revision impacted it. Redundant cement removed.  Cemented the femur, impacted and redundant cement removed.  I placed a 12.5 insert, reduced it, and held  an axial load throughout the curing of the cement.  Cemented the patellar component and clamped.  After full curing of the cement, removed the redundant cement meticulously with an osteotome.  We removed the trial, checked posteriorly.  Copiously irrigated with pulsatile lavage and antibiotic irrigation.  I placed a 12.5 insert rotating platform, reduced it, had full extension, full flexion, good stability with varus- valgus stressing at 0-30 degrees.  Excellent patellofemoral tracking. Negative anterior drawer.  Injected Marcaine in the  periarticular tissues.  Hemovac was placed and brought out through a stab wound in the skin.  We repaired the patellar arthrotomy with 1 Vicryl and a running V- Loc subcu in multiple layers of 2-0 and skin with 4-0 subcuticular Prolene.  Steri-Strips were applied.  Sterile dressing applied.  She had flexion to gravity to 90 degrees.  Tourniquet was deflated at an hour and 45 minutes and there was adequate revascularization of the lower extremity appreciated.  The patient tolerated the procedure well.  There were no complications.  ASSISTANT:  Cleophas Dunker, PA     Susa Day, M.D.     [name]  D:  03/22/2014  T:  03/23/2014  Job:  [id number]

## 2014-03-27 NOTE — Discharge Summary (Signed)
Physician Discharge Summary   Patient ID: [name] MRN: [id number] DOB/AGE: 1961-04-14 53 y.o.  Admit date: 03/22/2014 Discharge date: 03/26/2014  Primary Diagnosis: right knee primary DJD  Admission Diagnoses:  Past Medical History  Diagnosis Date  . Hypertension   . Asthma   . Morbid obesity   . PE (pulmonary embolism)   . DVT (deep venous thrombosis)   . GERD (gastroesophageal reflux disease)   . Dysfunctional uterine bleeding   . Headache(784.0)     MIGRAINES IN PAST  . Glaucoma   . Sleep apnea, obstructive     USES C-PAP  . Arthritis     knee    Discharge Diagnoses:   Principal Problem:   Right knee DJD  Estimated body mass index is 45.98 kg/(m^2) as calculated from the following:   Height as of this encounter: 5' 4"  (1.626 m).   Weight as of this encounter: 121.564 kg (268 lb).  Procedure:  Procedure(s) (LRB): RIGHT TOTAL KNEE ARTHROPLASTY (Right)   Consults: None  HPI: see H&P Laboratory Data: Admission on 03/22/2014, Discharged on 03/26/2014  Component Date Value Ref Range Status  . aPTT 03/13/2014 39* 24 - 37 seconds Final   Comment:        IF BASELINE aPTT IS ELEVATED, SUGGEST PATIENT RISK ASSESSMENT BE USED TO DETERMINE APPROPRIATE ANTICOAGULANT THERAPY.   . Color, Urine 03/13/2014 YELLOW  YELLOW Final  . APPearance 03/13/2014 CLEAR  CLEAR Final  . Specific Gravity, Urine 03/13/2014 1.014  1.005 - 1.030 Final  . pH 03/13/2014 5.5  5.0 - 8.0 Final  . Glucose, UA 03/13/2014 NEGATIVE  NEGATIVE mg/dL Final  . Hgb urine dipstick 03/13/2014 NEGATIVE  NEGATIVE Final  . Bilirubin Urine 03/13/2014 NEGATIVE  NEGATIVE Final  . Ketones, ur 03/13/2014 NEGATIVE  NEGATIVE mg/dL Final  . Protein, ur 03/13/2014 NEGATIVE  NEGATIVE mg/dL Final  . Urobilinogen, UA 03/13/2014 0.2  0.0 - 1.0 mg/dL Final  . Nitrite 03/13/2014 NEGATIVE  NEGATIVE Final  . Leukocytes, UA 03/13/2014 NEGATIVE  NEGATIVE Final   MICROSCOPIC NOT DONE ON URINES WITH NEGATIVE PROTEIN,  BLOOD, LEUKOCYTES, NITRITE, OR GLUCOSE <1000 mg/dL.  . ABO/RH(D) 03/22/2014 O POS   Final  . Antibody Screen 03/22/2014 NEG   Final  . Sample Expiration 03/22/2014 03/25/2014   Final  . WBC 03/23/2014 7.8  4.0 - 10.5 K/uL Final   WHITE COUNT CONFIRMED ON SMEAR  . RBC 03/23/2014 4.22  3.87 - 5.11 MIL/uL Final  . Hemoglobin 03/23/2014 11.3* 12.0 - 15.0 g/dL Final  . HCT 03/23/2014 33.4* 36.0 - 46.0 % Final  . MCV 03/23/2014 79.1  78.0 - 100.0 fL Final  . MCH 03/23/2014 26.8  26.0 - 34.0 pg Final  . MCHC 03/23/2014 33.8  30.0 - 36.0 g/dL Final  . RDW 03/23/2014 16.1* 11.5 - 15.5 % Final  . Platelets 03/23/2014 171  150 - 400 K/uL Final  . Sodium 03/23/2014 141  137 - 147 mEq/L Final  . Potassium 03/23/2014 3.8  3.7 - 5.3 mEq/L Final  . Chloride 03/23/2014 105  96 - 112 mEq/L Final  . CO2 03/23/2014 25  19 - 32 mEq/L Final  . Glucose, Bld 03/23/2014 159* 70 - 99 mg/dL Final  . BUN 03/23/2014 12  6 - 23 mg/dL Final  . Creatinine, Ser 03/23/2014 0.95  0.50 - 1.10 mg/dL Final  . Calcium 03/23/2014 9.2  8.4 - 10.5 mg/dL Final  . GFR calc non Af Amer 03/23/2014 66* >90 mL/min Final  . GFR  calc Af Amer 03/23/2014 77* >90 mL/min Final   Comment: (NOTE) The eGFR has been calculated using the CKD EPI equation. This calculation has not been validated in all clinical situations. eGFR's persistently <90 mL/min signify possible Chronic Kidney Disease.   . Anion gap 03/23/2014 11  5 - 15 Final  . WBC 03/24/2014 8.0  4.0 - 10.5 K/uL Final  . RBC 03/24/2014 3.70* 3.87 - 5.11 MIL/uL Final  . Hemoglobin 03/24/2014 9.8* 12.0 - 15.0 g/dL Final  . HCT 03/24/2014 29.4* 36.0 - 46.0 % Final  . MCV 03/24/2014 79.5  78.0 - 100.0 fL Final  . MCH 03/24/2014 26.5  26.0 - 34.0 pg Final  . MCHC 03/24/2014 33.3  30.0 - 36.0 g/dL Final  . RDW 03/24/2014 16.3* 11.5 - 15.5 % Final  . Platelets 03/24/2014 142* 150 - 400 K/uL Final  . WBC 03/25/2014 6.3  4.0 - 10.5 K/uL Final  . RBC 03/25/2014 3.65* 3.87 - 5.11  MIL/uL Final  . Hemoglobin 03/25/2014 9.8* 12.0 - 15.0 g/dL Final  . HCT 03/25/2014 28.7* 36.0 - 46.0 % Final  . MCV 03/25/2014 78.6  78.0 - 100.0 fL Final  . MCH 03/25/2014 26.8  26.0 - 34.0 pg Final  . MCHC 03/25/2014 34.1  30.0 - 36.0 g/dL Final  . RDW 03/25/2014 16.2* 11.5 - 15.5 % Final  . Platelets 03/25/2014 128* 150 - 400 K/uL Final  Hospital Outpatient Visit on 03/13/2014  Component Date Value Ref Range Status  . MRSA, PCR 03/13/2014 NEGATIVE  NEGATIVE Final  . Staphylococcus aureus 03/13/2014 NEGATIVE  NEGATIVE Final   Comment:        The Xpert SA Assay (FDA approved for NASAL specimens in patients over 62 years of age), is one component of a comprehensive surveillance program.  Test performance has been validated by [employer] for patients greater than or equal to 62 year old. It is not intended to diagnose infection nor to guide or monitor treatment.   . Sodium 03/13/2014 143  137 - 147 mEq/L Final  . Potassium 03/13/2014 3.7  3.7 - 5.3 mEq/L Final  . Chloride 03/13/2014 108  96 - 112 mEq/L Final  . CO2 03/13/2014 23  19 - 32 mEq/L Final  . Glucose, Bld 03/13/2014 104* 70 - 99 mg/dL Final  . BUN 03/13/2014 16  6 - 23 mg/dL Final  . Creatinine, Ser 03/13/2014 1.00  0.50 - 1.10 mg/dL Final  . Calcium 03/13/2014 9.5  8.4 - 10.5 mg/dL Final  . GFR calc non Af Amer 03/13/2014 62* >90 mL/min Final  . GFR calc Af Amer 03/13/2014 72* >90 mL/min Final   Comment: (NOTE) The eGFR has been calculated using the CKD EPI equation. This calculation has not been validated in all clinical situations. eGFR's persistently <90 mL/min signify possible Chronic Kidney Disease.   . Anion gap 03/13/2014 12  5 - 15 Final  . WBC 03/13/2014 3.5* 4.0 - 10.5 K/uL Final  . RBC 03/13/2014 4.56  3.87 - 5.11 MIL/uL Final  . Hemoglobin 03/13/2014 11.8* 12.0 - 15.0 g/dL Final  . HCT 03/13/2014 35.9* 36.0 - 46.0 % Final  . MCV 03/13/2014 78.7  78.0 - 100.0 fL Final  . MCH 03/13/2014 25.9* 26.0  - 34.0 pg Final  . MCHC 03/13/2014 32.9  30.0 - 36.0 g/dL Final  . RDW 03/13/2014 16.0* 11.5 - 15.5 % Final  . Platelets 03/13/2014 152  150 - 400 K/uL Final  . Prothrombin Time 03/13/2014 17.9* 11.6 -  15.2 seconds Final  . INR 03/13/2014 1.46  0.00 - 1.49 Final  Admission on 01/30/2014, Discharged on 02/01/2014  Component Date Value Ref Range Status  . Preg Test, Ur 01/30/2014 NEGATIVE  NEGATIVE Final   Comment:                                 THE SENSITIVITY OF THIS                          METHODOLOGY IS >20 mIU/mL.  . WBC 01/30/2014 10.0  4.0 - 10.5 K/uL Final  . RBC 01/30/2014 4.72  3.87 - 5.11 MIL/uL Final  . Hemoglobin 01/30/2014 12.8  12.0 - 15.0 g/dL Final  . HCT 01/30/2014 37.3  36.0 - 46.0 % Final  . MCV 01/30/2014 79.0  78.0 - 100.0 fL Final  . MCH 01/30/2014 27.1  26.0 - 34.0 pg Final  . MCHC 01/30/2014 34.3  30.0 - 36.0 g/dL Final  . RDW 01/30/2014 14.6  11.5 - 15.5 % Final  . Platelets 01/30/2014 128* 150 - 400 K/uL Final  . Creatinine, Ser 01/30/2014 1.15* 0.50 - 1.10 mg/dL Final  . GFR calc non Af Amer 01/30/2014 53* >90 mL/min Final  . GFR calc Af Amer 01/30/2014 61* >90 mL/min Final   Comment: (NOTE)                          The eGFR has been calculated using the CKD EPI equation.                          This calculation has not been validated in all clinical situations.                          eGFR's persistently <90 mL/min signify possible Chronic Kidney                          Disease.  . WBC 01/31/2014 8.7  4.0 - 10.5 K/uL Final  . RBC 01/31/2014 4.84  3.87 - 5.11 MIL/uL Final  . Hemoglobin 01/31/2014 13.0  12.0 - 15.0 g/dL Final  . HCT 01/31/2014 38.8  36.0 - 46.0 % Final  . MCV 01/31/2014 80.2  78.0 - 100.0 fL Final  . MCH 01/31/2014 26.9  26.0 - 34.0 pg Final  . MCHC 01/31/2014 33.5  30.0 - 36.0 g/dL Final  . RDW 01/31/2014 14.9  11.5 - 15.5 % Final  . Platelets 01/31/2014 151  150 - 400 K/uL Final  . Neutrophils Relative % 01/31/2014 79* 43 - 77 %  Final  . Neutro Abs 01/31/2014 6.9  1.7 - 7.7 K/uL Final  . Lymphocytes Relative 01/31/2014 11* 12 - 46 % Final  . Lymphs Abs 01/31/2014 0.9  0.7 - 4.0 K/uL Final  . Monocytes Relative 01/31/2014 10  3 - 12 % Final  . Monocytes Absolute 01/31/2014 0.9  0.1 - 1.0 K/uL Final  . Eosinophils Relative 01/31/2014 0  0 - 5 % Final  . Eosinophils Absolute 01/31/2014 0.0  0.0 - 0.7 K/uL Final  . Basophils Relative 01/31/2014 0  0 - 1 % Final  . Basophils Absolute 01/31/2014 0.0  0.0 - 0.1 K/uL Final  . WBC 02/01/2014 7.4  4.0 - 10.5 K/uL Final  .  RBC 02/01/2014 4.41  3.87 - 5.11 MIL/uL Final  . Hemoglobin 02/01/2014 11.9* 12.0 - 15.0 g/dL Final  . HCT 02/01/2014 35.5* 36.0 - 46.0 % Final  . MCV 02/01/2014 80.5  78.0 - 100.0 fL Final  . MCH 02/01/2014 27.0  26.0 - 34.0 pg Final  . MCHC 02/01/2014 33.5  30.0 - 36.0 g/dL Final  . RDW 02/01/2014 15.0  11.5 - 15.5 % Final  . Platelets 02/01/2014 114* 150 - 400 K/uL Final   Comment: SPECIMEN CHECKED FOR CLOTS                          REPEATED TO VERIFY                          DELTA CHECK NOTED                          PLATELET COUNT CONFIRMED BY SMEAR  . Neutrophils Relative % 02/01/2014 59  43 - 77 % Final  . Neutro Abs 02/01/2014 4.4  1.7 - 7.7 K/uL Final  . Lymphocytes Relative 02/01/2014 33  12 - 46 % Final  . Lymphs Abs 02/01/2014 2.4  0.7 - 4.0 K/uL Final  . Monocytes Relative 02/01/2014 8  3 - 12 % Final  . Monocytes Absolute 02/01/2014 0.6  0.1 - 1.0 K/uL Final  . Eosinophils Relative 02/01/2014 0  0 - 5 % Final  . Eosinophils Absolute 02/01/2014 0.0  0.0 - 0.7 K/uL Final  . Basophils Relative 02/01/2014 0  0 - 1 % Final  . Basophils Absolute 02/01/2014 0.0  0.0 - 0.1 K/uL Final     X-Rays:Dg Knee 2 Views Right  03/22/2014   CLINICAL DATA:  DJD.  EXAM: RIGHT KNEE - 1-2 VIEW  COMPARISON:  MRI 06/15/2005.  FINDINGS: Severe tricompartment degenerative change present. Degenerative changes are most prominent along the lateral and  patellofemoral compartments. Very prominent osteophytes noted patella femoral joint space. No acute bony abnormality.  IMPRESSION: Severe tricompartment degenerative change with degenerative changes most severe scratch about the lateral compartment and patellofemoral compartment. Very large osteophytes are present. Severe loss of joint space present.   Electronically Signed   By: Marcello Moores  Register   On: 03/22/2014 10:16   Dg Knee Right Port  03/22/2014   CLINICAL DATA:  Status post right knee replacement  EXAM: PORTABLE RIGHT KNEE - 1-2 VIEW  COMPARISON:  03/22/2014  FINDINGS: Two views of the right knee submitted. There is right knee prosthesis in anatomic alignment. Postsurgical changes with postsurgical drain in place. Small amount of periarticular soft tissue air.  IMPRESSION: Right knee prosthesis in anatomic alignment.   Electronically Signed   By: Lahoma Crocker M.D.   On: 03/22/2014 14:12    EKG: Orders placed or performed during the hospital encounter of 10/01/13  . ED EKG (<38mns upon arrival to the ED)  . ED EKG (<16ms upon arrival to the ED)  . EKG 12-Lead  . EKG 12-Lead  . EKG     Hospital Course: [name] is a [age].[facility]. [facility]2014 and underwent Procedure(s): RIGHT TOTAL KNEE ARTHROPLASTY.  Patient tolerated the procedure well and was later transferred to the recovery room and then to the orthopaedic floor for postoperative care.  They were given PO and IV analgesics for  pain control following their surgery.  They were given 24 hours of postoperative antibiotics of  Anti-infectives    Start     Dose/Rate Route Frequency Ordered Stop   03/22/14 2200  quiNINE (QUALAQUIN) capsule 324 mg  Status:  Discontinued     324 mg Oral Daily at bedtime 03/22/14 1541 03/22/14 1604   03/22/14 2200  vancomycin (VANCOCIN) 1,500 mg in sodium chloride 0.9 % 500 mL IVPB    Comments:  Per pharmacy   1,500 mg250 mL/hr  over 120 Minutes Intravenous Every 12 hours 03/22/14 1541 03/23/14 1238   03/22/14 1119  polymyxin B 500,000 Units, bacitracin 50,000 Units in sodium chloride irrigation 0.9 % 500 mL irrigation  Status:  Discontinued       As needed 03/22/14 1119 03/22/14 1307   03/22/14 0600  vancomycin (VANCOCIN) 1,500 mg in sodium chloride 0.9 % 500 mL IVPB     1,500 mg250 mL/hr over 120 Minutes Intravenous On call to O.R. 03/21/14 1712 03/22/14 1200     and started on DVT prophylaxis in the form of Xarelto, TED hose and SCDs.   PT and OT were ordered for total joint protocol.  Discharge planning consulted to help with postop disposition and equipment needs.  Patient had a good night on the evening of surgery.  They started to get up OOB with therapy on day one. Hemovac drain was pulled without difficulty.  Continued to work with therapy into day two.  By day four, the patient had progressed with therapy and meeting their goals.  Incision was healing well.  Patient was seen in rounds daily. We had anticipated D/C to inpatient rehab at [facility] but she was not approved through her insurance therefore she was D/C'd home on POD #4 and given contact information for [place] home PT. We will also contact [facility] home PT to be sure she is set up with HHPT.   Diet: Regular diet Activity:WBAT Follow-up:in 10 days Disposition - Home with HHPT Discharged Condition: good   Discharge Instructions    Call MD / Call 911    Complete by:  As directed   If you experience chest pain or shortness of breath, CALL 911 and be transported to the hospital emergency room.  If you develope a fever above 101 F, pus (white drainage) or increased drainage or redness at the wound, or calf pain, call your surgeon's office.     Call MD / Call 911    Complete by:  As directed   If you experience chest pain or shortness of breath, CALL 911 and be transported to the hospital emergency room.  If you develope a fever above 101 F, pus (white drainage)  or increased drainage or redness at the wound, or calf pain, call your surgeon's office.     Constipation Prevention    Complete by:  As directed   Drink plenty of fluids.  Prune juice may be helpful.  You may use a stool softener, such as Colace (over the counter) 100 mg twice a day.  Use MiraLax (over the counter) for constipation as needed.     Constipation Prevention    Complete by:  As directed   Drink plenty of fluids.  Prune juice may be helpful.  You may use a stool softener, such as Colace (over the counter) 100 mg twice a day.  Use MiraLax (over the counter) for constipation as needed.     Diet - low sodium heart healthy    Complete by:  As directed      Diet - low sodium heart healthy    Complete by:  As directed      Increase activity slowly as tolerated    Complete by:  As directed      Increase activity slowly as tolerated    Complete by:  As directed             Medication List    TAKE these medications        acetaminophen 500 MG tablet  Commonly known as:  TYLENOL  Take 1,000 mg by mouth every 4 (four) hours as needed for moderate pain.     ALPRAZolam 0.25 MG tablet  Commonly known as:  XANAX  Take 0.25 mg by mouth at bedtime as needed for sleep.     b complex vitamins tablet  Take 1 tablet by mouth every morning.     beclomethasone 80 MCG/ACT inhaler  Commonly known as:  QVAR  Inhale 2 puffs into the lungs 2 (two) times daily.     bimatoprost 0.01 % Soln  Commonly known as:  LUMIGAN  Place 1 drop into both eyes at bedtime.     brimonidine 0.1 % Soln  Commonly known as:  ALPHAGAN P  Place 1 drop into both eyes 2 (two) times daily.     brinzolamide 1 % ophthalmic suspension  Commonly known as:  AZOPT  Place 1 drop into both eyes 2 (two) times daily.     CALTRATE 600+D PLUS MINERALS PO  Take 1 tablet by mouth 3 (three) times daily.     celecoxib 200 MG capsule  Commonly known as:  CELEBREX  Take 200 mg by mouth every morning.     ciclesonide  160 MCG/ACT inhaler  Commonly known as:  ALVESCO  Inhale 2 puffs into the lungs 2 (two) times daily.     cyclobenzaprine 10 MG tablet  Commonly known as:  FLEXERIL  Take 10 mg by mouth at bedtime.     DHA PO  Take 1 tablet by mouth 2 (two) times daily.     diclofenac sodium 1 % Gel  Commonly known as:  VOLTAREN  Apply 1 application topically 4 (four) times daily as needed (for joint pain.).     diltiazem 240 MG 24 hr capsule  Commonly known as:  DILACOR XR  Take 240 mg by mouth at bedtime.     docusate sodium 100 MG capsule  Commonly known as:  COLACE  Take 1 capsule (100 mg total) by mouth 2 (two) times daily as needed for mild constipation.     esomeprazole 40 MG capsule  Commonly known as:  NEXIUM  Take 40 mg by mouth daily before breakfast.     fish oil-omega-3 fatty acids 1000 MG capsule  Take 1 g by mouth 2 (two) times daily.     HYDROCODONE-ACETAMINOPHEN PO  Take 15 mLs by mouth 4 (four) times daily as needed (pain). (7.5-325 mg liquid)     iron polysaccharides 150 MG capsule  Commonly known as:  NIFEREX  Take 150 mg by mouth 2 (two) times daily.     levalbuterol 1.25 MG/3ML nebulizer solution  Commonly known as:  XOPENEX  Take 1.25 mg by nebulization every 6 (six) hours as needed for wheezing or shortness of breath.     loratadine 10 MG tablet  Commonly known as:  CLARITIN  Take 10 mg by mouth at bedtime.     loratadine-pseudoephedrine 5-120 MG per tablet  Commonly  known as:  CLARITIN-D 12-hour  Take 1 tablet by mouth 2 (two) times daily.     magnesium chloride 64 MG Tbec SR tablet  Commonly known as:  SLOW-MAG  Take 1-2 tablets by mouth 2 (two) times daily. Patient takes 2 tablet every morning and 1 tablet with dinner     methocarbamol 500 MG tablet  Commonly known as:  ROBAXIN  Take 1 tablet (500 mg total) by mouth 3 (three) times daily.     mometasone 50 MCG/ACT nasal spray  Commonly known as:  NASONEX  Place 2 sprays into the nose daily as needed  (allergies).     montelukast 10 MG tablet  Commonly known as:  SINGULAIR  Take 10 mg by mouth at bedtime.     multivitamin tablet  Take 1 tablet by mouth 2 (two) times daily.     niacin 500 MG tablet  Take 500 mg by mouth daily with breakfast. Needs to be flush free per patient     oxyCODONE 5 MG immediate release tablet  Commonly known as:  Oxy IR/ROXICODONE  1-2 po q4-6 prn pain     PATADAY 0.2 % Soln  Generic drug:  Olopatadine HCl  Apply 1 drop to eye 2 (two) times daily as needed.     potassium chloride SA 20 MEQ tablet  Commonly known as:  K-DUR,KLOR-CON  Take 20-40 mEq by mouth 2 (two) times daily. 40 meq with breakfast and 20 meq with dinner     pyridOXINE 100 MG tablet  Commonly known as:  VITAMIN B-6  Take 100 mg by mouth every morning.     quiNINE 324 MG capsule  Commonly known as:  QUALAQUIN  Take 324 mg by mouth at bedtime.     sodium chloride 0.65 % Soln nasal spray  Commonly known as:  OCEAN  Place 2 sprays into both nostrils 2 (two) times daily.     SYMBICORT 160-4.5 MCG/ACT inhaler  Generic drug:  budesonide-formoterol  Inhale 2 puffs into the lungs 2 (two) times daily.     traMADol 50 MG tablet  Commonly known as:  ULTRAM  Take 1 tablet (50 mg total) by mouth every 6 (six) hours as needed.     valsartan-hydrochlorothiazide 80-12.5 MG per tablet  Commonly known as:  DIOVAN-HCT  Take 1 tablet by mouth every morning.     Vitamin B-12 2500 MCG Subl  Place 2 tablets under the tongue at bedtime.     vitamin C 1000 MG tablet  Take 1,000 mg by mouth every morning.     Vitamin D (Ergocalciferol) 50000 UNITS Caps capsule  Commonly known as:  DRISDOL  Take 50,000 Units by mouth every 7 (seven) days. Patient takes on Saturday     XARELTO 20 MG Tabs tablet  Generic drug:  rivaroxaban  Take 20 mg by mouth at bedtime.           Follow-up Information    Follow up with BEANE,JEFFREY C, MD In 2 weeks.   Specialty:  Orthopedic Surgery   Why:  For  suture removal   Contact information:   [address] [place] [address] [phone]       Signed: Lacie Draft, PA-C Orthopaedic Surgery 03/27/2014, 8:25 AM

## 2014-03-27 NOTE — Progress Notes (Signed)
CSW contacted pt this am to offer support. " I'm upset. My doctor said I could stay until Tuesday in order for home health services to be arranged. My services won't start until Wed. A lady came in and said I needed to leave last night or I  would get a bill for my stay. I thought it was very unprofessional the way my discharge was handled. " Pt reports she is managing at home. " I'm just moving around a little. I'm going from my bed  to the bathroom or the chair. I'm doing OK. "  Pt declined CSW assistance. " I'll be all right. "   Werner Lean LCSW [phone]

## 2014-03-28 NOTE — Progress Notes (Signed)
Discharge summary sent to payer through [facility]  

## 2014-05-23 ENCOUNTER — Encounter: Payer: BC Managed Care – PPO | Attending: Surgery | Admitting: Dietician

## 2014-05-23 DIAGNOSIS — Z6841 Body Mass Index (BMI) 40.0 and over, adult: Secondary | ICD-10-CM | POA: Insufficient documentation

## 2014-05-23 NOTE — Progress Notes (Signed)
  Follow-up visit:  4 months Post-Operative LAGB over RYGB Surgery  Medical Nutrition Therapy:  Appt start time: 0810 end time:  830  Primary concerns today: Post-operative Bariatric Surgery Nutrition Management.  [name] returns having lost 20 pounds. Has had another fill of 1 cc added to her band. Recently had a knee replacement and a stomach virus. Only drinking 1 protein shake per day per recommendation to eat more actual food as tolerated. Had some macaroni and cheese this past weekend and it hurt her stomach.  Surgery date: 01/30/14  Surgery type: LAGB (banding RYGB pouch)  [facility] weight at [facility]: 290 lbs on 02/08/14  Weight today: 247.4 lbs Weight change: 20.7 lbs  Total weight lost: 42.6 lbs   Preferred Learning Style:   No preference indicated   Learning Readiness:   Ready   24-hr recall: B (AM): boiled egg and 2 strips bacon (12g) Snk (AM): pickle L (PM): meat and 2 vegetables OR salad with egg (14-21g) Snk (PM): protein shake (30g)  D (PM): *see lunch (14-21g) Snk (PM): sugar free popsicle  Fluid intake: water, unsweet tea, and 11 oz of protein shakes (72 oz + per day) Estimated total protein intake: 70-85 g per day  Medications: see list Supplementation: taking  Drinking while eating: no Hair loss: unknown Carbonated beverages: no N/V/D/C: "had the spit-ups" after eating too much too fast; regular bowel movements Dumping syndrome: none Last Lap-Band fill: 1cc recently per patient  Recent physical activity:  Getting ready to start BELT program; currently in PT 2x a week from knee surgery; personal trainer 2x a week  Progress Towards Goal(s):  In progress.    Nutritional Diagnosis:  [place]-3.3 Overweight/obesity related to past poor dietary habits and physical inactivity as evidenced by patient w/ recent LAGB surgery following dietary guidelines for continued weight loss.     Intervention:  Nutrition counseling provided.  Teaching Method Utilized:   Visual Auditory  Barriers to learning/adherence to lifestyle change: recently knee surgery  Demonstrated degree of understanding via:  Teach Back   Monitoring/Evaluation:  Dietary intake, exercise, lap band fills, and body weight. Follow up in 4 months for 8 month post-op visit.

## 2014-05-23 NOTE — Patient Instructions (Signed)
-  Limit shakes to 1 per day -Increase exercise as tolerated

## 2014-08-17 ENCOUNTER — Ambulatory Visit (INDEPENDENT_AMBULATORY_CARE_PROVIDER_SITE_OTHER): Payer: BC Managed Care – PPO | Admitting: Podiatry

## 2014-08-17 ENCOUNTER — Encounter: Payer: Self-pay | Admitting: Podiatry

## 2014-08-17 ENCOUNTER — Ambulatory Visit (INDEPENDENT_AMBULATORY_CARE_PROVIDER_SITE_OTHER): Payer: BC Managed Care – PPO

## 2014-08-17 ENCOUNTER — Ambulatory Visit: Payer: BC Managed Care – PPO

## 2014-08-17 VITALS — BP 121/79 | HR 90 | Resp 12

## 2014-08-17 DIAGNOSIS — M79671 Pain in right foot: Secondary | ICD-10-CM

## 2014-08-17 DIAGNOSIS — M79672 Pain in left foot: Secondary | ICD-10-CM | POA: Diagnosis not present

## 2014-08-17 DIAGNOSIS — S90122D Contusion of left lesser toe(s) without damage to nail, subsequent encounter: Secondary | ICD-10-CM

## 2014-08-17 NOTE — Progress Notes (Signed)
   Subjective:    Patient ID: [name], female    DOB: 1960-08-02, 54 y.o.   MRN: [id number]  HPI  PT STATED LT FOOT HAVE BLISTER ON THE LT FOOT MEDIAL SIDE OF THE FOOT IS PAINFUL AND SWOLLEN FOR 3 DAYS. FOOT IS GETTING WORSE FEELS BURNING AND SKIN GETTING THICKER. TRIED TO SOAK WITH JOHNSON FOOT SOAK AND USED NEOSPORIN BUT NO HELP.   Review of Systems  Skin: Positive for color change.  Allergic/Immunologic: Positive for environmental allergies.       Objective:   Physical Exam        Assessment & Plan:

## 2014-08-18 NOTE — Progress Notes (Signed)
Subjective:     Patient ID: [name], female   DOB: 28-Jul-1960, 54 y.o.   MRN: [id number]  HPI patient presents stating that she has bad foot structure and she developed a blister on the inside of her left first metatarsal over right and that she has just tried to get more active to lose weight and feels like she has some swelling in her legs that she wanted to have checked   Review of Systems  All other systems reviewed and are negative.      Objective:   Physical Exam  Constitutional: She is oriented to person, place, and time.  Cardiovascular: Intact distal pulses.   Musculoskeletal: Normal range of motion.  Neurological: She is oriented to person, place, and time.  Skin: Skin is warm and dry.  Nursing note and vitals reviewed.  neurovascular status is found to be intact with muscle strength adequate and negative Homans sign was noted. There is some lower extremity swelling but it appears to be nondescript in bilateral and does not appear to have any relationship to her foot issues. She has a small blister on the medial side of the first metatarsal head left secondary to friction and keratotic irritation on the medial side of the right first metatarsal due to her increased activity     Assessment:     Significant foot structural issues leading to probable pressure on the medial side of the foot left over right with a small localized blister left and keratotic lesion right    Plan:     H&P and x-rays reviewed with patient. Today I went ahead and I debrided the lesion and there was no (or odor and just a clear fluid of a small nature which came out and I applied Neosporin with sterile dressing. I gave instructions on soaks and I also want her to see her family physician for her swelling and I advised her on elevation for that condition. If she should start to develop any pain or any pain in the back of her calf or any indications of chest issues or shortness of breath she is to go  straight to the emergency room. I debrided the lesion on the right foot and she tolerated that well

## 2014-08-30 ENCOUNTER — Ambulatory Visit (INDEPENDENT_AMBULATORY_CARE_PROVIDER_SITE_OTHER): Payer: BC Managed Care – PPO | Admitting: Neurology

## 2014-08-30 ENCOUNTER — Encounter: Payer: Self-pay | Admitting: Neurology

## 2014-08-30 VITALS — BP 128/74 | HR 68 | Resp 14 | Ht 64.75 in | Wt 249.0 lb

## 2014-08-30 DIAGNOSIS — R479 Unspecified speech disturbances: Secondary | ICD-10-CM | POA: Diagnosis not present

## 2014-08-30 DIAGNOSIS — R202 Paresthesia of skin: Secondary | ICD-10-CM | POA: Diagnosis not present

## 2014-08-30 DIAGNOSIS — R42 Dizziness and giddiness: Secondary | ICD-10-CM | POA: Diagnosis not present

## 2014-08-30 DIAGNOSIS — G4489 Other headache syndrome: Secondary | ICD-10-CM

## 2014-08-30 NOTE — Progress Notes (Signed)
[facility]  PATIENT: [name] DOB: 1960/08/23  REFERRING DOCTOR OR PCP:  Lucianne Lei SOURCE: records from PCP, patient  _________________________________   HISTORICAL  CHIEF COMPLAINT:  Chief Complaint  Patient presents with  . Headache    Sts. she used to have migraines assoc. with tmj.  TMJ issues were resolved with braces.  She c/o dizziness, difficulty with balance onset 3 weeks ago, about the time she had sx. of a sinus infection.  Sts. dizziness has persisted, and she occasionally has trouble getting words out/fim  . Balance disturbance    HISTORY OF PRESENT ILLNESS:  I had the pleasure of seeing your patient, [name], at [facility] for a neurologic consultation regarding her headaches and slowed speech.   She woke up late days ago with a headache that started on the right forehead. The headache spread to the bilateral after hour or 2. Pain was moderate and had a more constant quality to it. She went to work but the pain persisted and she return home early and went to bed. She also stayed in bed most of Thursday. On Friday, she was feeling better and she saw her allergist. He felt she had sinusitis and prescribed her a steroid shot and an antibiotic. Since then, she has felt better. The headache has gone from being constant to more intermittent and not as severe. Pain is mildly worse with movements   She had some light sensitivity > sound sensitivity last week but not this week.    The TV bothered her.  In the past, she used to get frequent migraines with a different type of pain. They seem to be associated with TMJ and she wore braces for a while. The frequency and severity of the migraines improved.   Braces were removed last year.     Around that same time last week, she began to notice that she was having some difficulty with her speech. Specifically she felt her speech was slowed.  She had some difficulty getting some words  out and felt her memory was not working as rapidly. She feels that the symptoms have not improved any since last week.  She has noted some dizziness since last week, as well. If she puts her head down or moves quickly she will get some vertigo. When she walks she feels slightly lightheaded. She has not had any syncope.   She feels her gait has been a little bit off the last 2 weeks. She also fell 2 weeks ago while walking without definitely tripping. Last week she noticed that when she tried to sit down on the toilet she fell off to one side. She feels her balance is not at his baseline.  She has had some tremors as well.  Her supervisor noted that her mouth was shaking in her voice was shaky at work. Sometimes she gets a tremor sensation throughout her body, especially in the legs.  She notes some numbness in the left fingertips not the thumb. She also notes some numbness in the left toes. She denies neck pain. However, she does have significant degenerative changes. An MRI of the cervical spine 01/19/2011 showed spinal stenosis at C3-C4 and C4-C5. This was worse at C3-C4 where there was mildly abnormal spinal cord signal.   An MRI of the brain the same day did not show any acute or significant chronic findings. Braces obscured some of the images.  I reviewed blood work from February 2016. The CBC, CMP,  thyroid function and lipid tests were essentially normal.  REVIEW OF SYSTEMS: Constitutional: No fevers, chills, sweats, or change in appetite.  She notes fatigue and sleepiness at times..  Eyes: No visual changes, double vision, eye pain Ear, nose and throat: No hearing loss, ear pain, nasal congestion, sore throat.  She notes some tinnitus. Cardiovascular: No chest pain, palpitations Respiratory: No shortness of breath at rest or with exertion.   No wheezes.  She has OSA GastrointestinaI: No nausea, vomiting, diarrhea, abdominal pain, fecal incontinence Genitourinary: No dysuria, urinary  retention or frequency.  No nocturia. Musculoskeletal: No neck pain, back pain Integumentary: No rash, pruritus, skin lesions.  She has some ankle edema. Neurological: as above Psychiatric: No depression at this time.  Notes some anxiety  Endocrine: No palpitations, diaphoresis, change in appetite, change in weigh or increased thirst Hematologic/Lymphatic: No anemia, purpura, petechiae. She feels she bruises easily. Allergic/Immunologic: No itchy/runny eyes, nasal congestion, recent allergic reactions, rashes  ALLERGIES: Allergies  Allergen Reactions  . Beta Adrenergic Blockers Other (See Comments)    RED EYES AND CONGESTION  . Penicillins Hives, Itching and Swelling  . Sulfa Antibiotics Hives    RED EYES  . Thimerosal Hives and Itching    HOME MEDICATIONS:  Current outpatient prescriptions:  .  acetaminophen (TYLENOL) 500 MG tablet, Take 1,000 mg by mouth every 4 (four) hours as needed for moderate pain., Disp: , Rfl:  .  ALPRAZolam (XANAX) 0.25 MG tablet, Take 0.25 mg by mouth at bedtime as needed for sleep. , Disp: , Rfl:  .  Ascorbic Acid (VITAMIN C) 1000 MG tablet, Take 1,000 mg by mouth every morning. , Disp: , Rfl:  .  b complex vitamins tablet, Take 1 tablet by mouth every morning. , Disp: , Rfl:  .  beclomethasone (QVAR) 80 MCG/ACT inhaler, Inhale 2 puffs into the lungs 2 (two) times daily., Disp: , Rfl:  .  bimatoprost (LUMIGAN) 0.01 % SOLN, Place 1 drop into both eyes at bedtime. , Disp: , Rfl:  .  brimonidine (ALPHAGAN P) 0.1 % SOLN, Place 1 drop into both eyes 2 (two) times daily., Disp: , Rfl:  .  brinzolamide (AZOPT) 1 % ophthalmic suspension, Place 1 drop into both eyes 2 (two) times daily. , Disp: , Rfl:  .  Calcium Carbonate-Vit D-Min (CALTRATE 600+D PLUS MINERALS PO), Take 1 tablet by mouth 3 (three) times daily. , Disp: , Rfl:  .  celecoxib (CELEBREX) 200 MG capsule, Take 200 mg by mouth every morning. , Disp: , Rfl:  .  Cyanocobalamin (VITAMIN B-12) 2500 MCG  SUBL, Place 2 tablets under the tongue at bedtime., Disp: , Rfl:  .  cyclobenzaprine (FLEXERIL) 10 MG tablet, Take 10 mg by mouth at bedtime. , Disp: , Rfl:  .  diltiazem (DILACOR XR) 240 MG 24 hr capsule, Take 240 mg by mouth at bedtime., Disp: , Rfl:  .  Docosahexaenoic Acid (DHA PO), Take 1 tablet by mouth 2 (two) times daily., Disp: , Rfl:  .  esomeprazole (NEXIUM) 40 MG capsule, Take 40 mg by mouth daily before breakfast., Disp: , Rfl:  .  fish oil-omega-3 fatty acids 1000 MG capsule, Take 1 g by mouth 2 (two) times daily. , Disp: , Rfl:  .  iron polysaccharides (NIFEREX) 150 MG capsule, Take 150 mg by mouth 2 (two) times daily., Disp: , Rfl:  .  levalbuterol (XOPENEX) 1.25 MG/3ML nebulizer solution, Take 1.25 mg by nebulization every 6 (six) hours as needed for wheezing or shortness  of breath. , Disp: , Rfl:  .  magnesium chloride (SLOW-MAG) 64 MG TBEC, Take 1-2 tablets by mouth 2 (two) times daily. Patient takes 2 tablet every morning and 1 tablet with dinner, Disp: , Rfl:  .  mometasone (NASONEX) 50 MCG/ACT nasal spray, Place 2 sprays into the nose daily as needed (allergies). , Disp: , Rfl:  .  montelukast (SINGULAIR) 10 MG tablet, Take 10 mg by mouth at bedtime., Disp: , Rfl:  .  Multiple Vitamin (MULTIVITAMIN) tablet, Take 1 tablet by mouth 2 (two) times daily. , Disp: , Rfl:  .  niacin 500 MG tablet, Take 500 mg by mouth daily with breakfast. Needs to be flush free per patient, Disp: , Rfl:  .  PAZEO 0.7 % SOLN, , Disp: , Rfl:  .  potassium chloride SA (K-DUR,KLOR-CON) 20 MEQ tablet, Take 20-40 mEq by mouth 2 (two) times daily. 40 meq with breakfast and 20 meq with dinner, Disp: , Rfl:  .  pyridOXINE (VITAMIN B-6) 100 MG tablet, Take 100 mg by mouth every morning. , Disp: , Rfl:  .  quiNINE (QUALAQUIN) 324 MG capsule, Take 324 mg by mouth at bedtime. , Disp: , Rfl:  .  Rivaroxaban (XARELTO) 20 MG TABS, Take 20 mg by mouth at bedtime. , Disp: , Rfl:  .  sodium chloride (OCEAN) 0.65 %  SOLN nasal spray, Place 2 sprays into both nostrils 2 (two) times daily., Disp: , Rfl:  .  valsartan-hydrochlorothiazide (DIOVAN-HCT) 80-12.5 MG per tablet, Take 1 tablet by mouth every morning. , Disp: , Rfl:  .  Vitamin D, Ergocalciferol, (DRISDOL) 50000 UNITS CAPS capsule, Take 50,000 Units by mouth every 7 (seven) days. Patient takes on Saturday, Disp: , Rfl:   PAST MEDICAL HISTORY: Past Medical History  Diagnosis Date  . Hypertension   . Asthma   . Morbid obesity   . PE (pulmonary embolism)   . DVT (deep venous thrombosis)   . GERD (gastroesophageal reflux disease)   . Dysfunctional uterine bleeding   . Headache(784.0)     MIGRAINES IN PAST  . Glaucoma   . Sleep apnea, obstructive     USES C-PAP  . Arthritis     knee     PAST SURGICAL HISTORY: Past Surgical History  Procedure Laterality Date  . Posterior laminectomy / decompression cervical spine  01/26/2011  . Glaucoma surgery  10/31/2009  . Gastric bypass  [id number]  . Knee arthroscopy  12/2007  . Dilation and curettage of uterus    . Hysteroscopy    . Eye surgery  11/16/2009    FOR GLAUCOMA AND LASER SURGERY  X2  . Cataract surg    . Ivc filter      2010 - THEN REMOVED THEN PLACED AGAIN 2012  . Total knee arthroplasty  09/09/2011    Procedure: TOTAL KNEE ARTHROPLASTY;  Surgeon: Johnn Hai, MD;  Location: [facility];  Service: Orthopedics;  Laterality: Left;  . Capsulotomy  05/03/2012    Procedure: MINOR CAPSULOTOMY;  Surgeon: Myrtha Mantis., MD;  Location: [place];  Service: Ophthalmology;  Laterality: Right;  . Yag laser application  2/42/3536    Procedure: YAG LASER APPLICATION;  Surgeon: Myrtha Mantis., MD;  Location: [place];  Service: Ophthalmology;  Laterality: Right;  . Capsulotomy  05/05/2012    Procedure: MINOR CAPSULOTOMY;  Surgeon: Myrtha Mantis., MD;  Location: [place];  Service: Ophthalmology;  Laterality: Right;  . Yag laser application  1/44/3154  Procedure: YAG LASER  APPLICATION;  Surgeon: Myrtha Mantis., MD;  Location: [place];  Service: Ophthalmology;  Laterality: Right;  . Abdominal hysterectomy    . Endometrial ablation      failure   . Laparoscopic gastric banding N/A 01/30/2014    Procedure: LAPAROSCOPIC GASTRIC BANDING over bypass ;  Surgeon: Pedro Earls, MD;  Location: [facility];  Service: General;  Laterality: N/A;  . Hiatal hernia repair  01/30/2014    Procedure: HERNIA REPAIR HIATAL;  Surgeon: Pedro Earls, MD;  Location: [facility];  Service: General;;  . Upper gi endoscopy N/A 01/30/2014    Procedure: UPPER GI ENDOSCOPY;  Surgeon: Pedro Earls, MD;  Location: [facility];  Service: General;  Laterality: N/A;  . Hernia repair    . Joint replacement    . Total knee arthroplasty Right 03/22/2014    Procedure: RIGHT TOTAL KNEE ARTHROPLASTY;  Surgeon: Johnn Hai, MD;  Location: [facility];  Service: Orthopedics;  Laterality: Right;    FAMILY HISTORY: Family History  Problem Relation Age of Onset  . Glaucoma    . Cerebral aneurysm    . Stroke    . Arthritis Mother   . Hypertension Mother   . Diabetes Mother   . Arthritis Father   . Diabetes Father   . Alcohol abuse Father     SOCIAL HISTORY:  History   Social History  . Marital Status: Single    Spouse Name: N/A  . Number of Children: N/A  . Years of Education: N/A   Occupational History  . Not on file.   Social History Main Topics  . Smoking status: Never Smoker   . Smokeless tobacco: Never Used  . Alcohol Use: No  . Drug Use: No  . Sexual Activity: Not on file   Other Topics Concern  . Not on file   Social History Narrative     PHYSICAL EXAM  Filed Vitals:   08/30/14 1516  BP: 128/74  Pulse: 68  Resp: 14  Height: 5' 4.75" (1.645 m)  Weight: 249 lb (112.946 kg)    Body mass index is 41.74 kg/(m^2).   General: The patient is an obese woman and in no acute distress  Eyes:  Funduscopic exam shows normal optic discs and retinal vessels.  Neck:  The neck is supple, no carotid bruits are noted.  The neck is nontender.  Cardiovascular: The heart has a regular rate and rhythm with a normal S1 and S2. There were no murmurs, gallops or rubs. Lungs are clear to auscultation.  Skin/Extremities: Extremities are without rash. She does have mild ankle edema..  Musculoskeletal:  Back is nontender  Neurologic Exam  Mental status: The patient is alert and oriented x 3 at the time of the examination. The patient has apparent normal recent and remote memory, with an apparently normal attention span and concentration ability.   There was no definite aphasia though she did poor stick, with rightward a couple times during our interaction..  Cranial nerves: Extraocular movements are full. Pupils are equal, round, and reactive to light and accomodation.  Visual fields are full.  Facial symmetry is present. There is good facial sensation to soft touch bilaterally.Facial strength is normal.  Trapezius and sternocleidomastoid strength is normal. No dysarthria is noted.  The tongue is midline, and the patient has symmetric elevation of the soft palate. No obvious hearing deficits are noted.  Motor:  Muscle bulk is normal.   Tone is normal. Strength  is  5 / 5 in all 4 extremities.   Sensory: Sensory testing is intact to pinprick, soft touch and vibration sensation in all 4 extremities.  Coordination: Cerebellar testing reveals good finger-nose-finger and heel-to-shin bilaterally.  Gait and station: Station is normal.   Gait is normal. Tandem gait is mildly wide. Romberg is negative.   Reflexes: Deep tendon reflexes are symmetric and normal in the arms and the knees. They were absent at the ankles..   Plantar responses are flexor.    DIAGNOSTIC DATA (LABS, IMAGING, TESTING) - I reviewed patient records, labs, notes, testing and imaging myself where available.  Lab Results  Component Value Date   WBC 6.3 03/25/2014   HGB 9.8* 03/25/2014   HCT 28.7*  03/25/2014   MCV 78.6 03/25/2014   PLT 128* 03/25/2014      Component Value Date/Time   NA 141 03/23/2014 0435   K 3.8 03/23/2014 0435   CL 105 03/23/2014 0435   CO2 25 03/23/2014 0435   GLUCOSE 159* 03/23/2014 0435   BUN 12 03/23/2014 0435   CREATININE 0.95 03/23/2014 0435   CALCIUM 9.2 03/23/2014 0435   PROT 5.8* 09/16/2011 0620   ALBUMIN 2.3* 09/16/2011 0620   AST 17 09/16/2011 0620   ALT 18 09/16/2011 0620   ALKPHOS 51 09/16/2011 0620   BILITOT 0.4 09/16/2011 0620   GFRNONAA 66* 03/23/2014 0435   GFRAA 77* 03/23/2014 0435   Lab Results  Component Value Date   CHOL  08/09/2007    164        ATP III CLASSIFICATION:  <200     mg/dL   Desirable  200-239  mg/dL   Borderline High  >=240    mg/dL   High   HDL 28* 08/09/2007   LDLCALC * 08/09/2007    104        Total Cholesterol/HDL:CHD Risk Coronary Heart Disease Risk Table                     Men   Women  1/2 Average Risk   3.4   3.3   TRIG 161* 08/09/2007   CHOLHDL 5.9 08/09/2007   Lab Results  Component Value Date   HGBA1C 5.7* 01/25/2013   Lab Results  Component Value Date   VITAMINB12 >2000* 09/11/2011   Lab Results  Component Value Date   TSH 3.134 01/25/2013       ASSESSMENT AND PLAN  Other headache syndrome - Plan: MR Brain Wo Contrast  Speech disturbance - Plan: MR Brain Wo Contrast  Paresthesias - Plan: MR Brain Wo Contrast  Vertigo - Plan: MR Brain Wo Contrast   In summary, [name] is a 54 year old woman with headaches over the last week and a half associated with a feeling of being slowed and having some difficulties with word finding. She also has had some vertigo and paresthesias I'm not sure if the symptoms are related or not. He does have some vascular risk factors and I will check an MRI of the brain to use determine if the stroke occurred. If present, I would consider adding an antiplatelet agent to her regimen. Speech symptoms are not bad enough to consider speech therapy. Her  headache is improving and hopefully will improve further as the month goes on. She notes that her headache improved after treatment for sinusitis and it is possible that they were related.    Some of the numbness in her left hand could be related to her severe degenerative  changes in the cervical spine. If this worsens or becomes more painful, I would consider an MRI of the cervical spine to better evaluate.  Since she is improving, I did not make a follow-up visit but would be happy to see her back if she has new or worsening neurologic symptoms per Nazair Fortenberry A. Felecia Shelling, MD, PhD 10/15/3149, 7:61 PM Certified in Neurology, Clinical Neurophysiology, Sleep Medicine, Pain Medicine and Neuroimaging  [facility] [address], [place], [place] [address] [phone]

## 2014-09-06 ENCOUNTER — Ambulatory Visit (INDEPENDENT_AMBULATORY_CARE_PROVIDER_SITE_OTHER): Payer: BC Managed Care – PPO

## 2014-09-06 DIAGNOSIS — R479 Unspecified speech disturbances: Secondary | ICD-10-CM | POA: Diagnosis not present

## 2014-09-06 DIAGNOSIS — R202 Paresthesia of skin: Secondary | ICD-10-CM | POA: Diagnosis not present

## 2014-09-06 DIAGNOSIS — G4489 Other headache syndrome: Secondary | ICD-10-CM | POA: Diagnosis not present

## 2014-09-06 DIAGNOSIS — R42 Dizziness and giddiness: Secondary | ICD-10-CM

## 2014-09-10 ENCOUNTER — Telehealth: Payer: Self-pay | Admitting: Neurology

## 2014-09-10 ENCOUNTER — Telehealth: Payer: Self-pay | Admitting: *Deleted

## 2014-09-10 NOTE — Telephone Encounter (Signed)
Pt called stating she returned Faith's call.  Please call and advise.  Pt can be reached @ [phone].

## 2014-09-10 NOTE — Telephone Encounter (Signed)
-----   Message from Britt Bottom, MD sent at 09/07/2014  2:18 PM EDT ----- Please let her know that the MRI of the brain is normal

## 2014-09-10 NOTE — Telephone Encounter (Signed)
I have spoken with [name] this afternoon and per RAS, advised that her mri brain is normal.  She verbalized understanding of same/fim

## 2014-09-10 NOTE — Telephone Encounter (Signed)
Duplicate encounter.  See result note/fim

## 2014-09-24 ENCOUNTER — Ambulatory Visit: Payer: BC Managed Care – PPO | Admitting: Dietician

## 2014-12-14 ENCOUNTER — Ambulatory Visit: Payer: BC Managed Care – PPO | Admitting: Dietician

## 2014-12-19 ENCOUNTER — Encounter: Payer: BC Managed Care – PPO | Attending: Surgery | Admitting: Dietician

## 2014-12-19 ENCOUNTER — Encounter: Payer: Self-pay | Admitting: Dietician

## 2014-12-19 DIAGNOSIS — Z6841 Body Mass Index (BMI) 40.0 and over, adult: Secondary | ICD-10-CM | POA: Diagnosis not present

## 2014-12-19 DIAGNOSIS — Z713 Dietary counseling and surveillance: Secondary | ICD-10-CM | POA: Diagnosis not present

## 2014-12-19 NOTE — Progress Notes (Signed)
  Follow-up visit:  10 months Post-Operative LAGB over RYGB Surgery  Medical Nutrition Therapy:  Appt start time: 0205 end time: 240  Primary concerns today: Post-operative Bariatric Surgery Nutrition Management. Returns with a 9.3 lbs weight gain since February. Was going to [employer] and doing exercise at night and started having problems with her memory. Went to a holistic doctor who told her she was having too much protein in April. Cut out protein shakes then and started gaining weight. Started protein shakes back in July when her weight got down to 239 lbs. Was having problems with asthma and changes at work. Stopped protein shakes again when having problems with memory again. Now having Beneprotein with 3-4 oz juice 2 x day.   Memory is better now.  Had a fill about a month ago and it was the first time she felt the fill. Has had around 3 fills. Was not feeling full and was grazing before this fill.   Surgery date: 01/30/14  Surgery type: LAGB (banding RYGB pouch)  Start weight at [facility]: 290 lbs on 02/08/14  Weight today: 256.7 lbs  Weight change: 9.3 lbs gain  Total weight lost: 33.3 lbs   Preferred Learning Style:   No preference indicated   Learning Readiness:   Ready   24-hr recall: B (AM): 2 boiled eggs and 2 strips bacon with applesauce and beneprotein and juice(24g) Snk (AM): [place] protein bar or skinny pop or light activia with walnuts or almonds (0-10g) L (PM):  salad with egg with nuts or chicken/tuna salad kit or chicken with vegetables sometimes with sweet potatoes with beneprotein and juice(20-27g) Snk (PM): sugar free popsicle or other snack D (PM): *see lunch (14-21g) Snk (PM): sugar free popsicle  Fluid intake: water, 10-12 oz half sweet tea, and 8 oz of protein in juice (95 oz + per day) Estimated total protein intake: 58-75 g per day  Medications: see list Supplementation: taking except for calcium  Drinking while eating: no Hair loss:  no Carbonated beverages: every now and then N/V/D/C: "had the spit-ups" after eating too much too fast; regular bowel movements Dumping syndrome: none Last Lap-Band fill: had a fill around 3-4 weeks ago  Recent physical activity:  Works out 3 x week at gym for 60 minutes  Progress Towards Goal(s):  In progress.    Nutritional Diagnosis:  [place]-3.3 Overweight/obesity related to past poor dietary habits and physical inactivity as evidenced by patient w/ recent LAGB surgery following dietary guidelines for continued weight loss.     Intervention:  Nutrition counseling provided.  Teaching Method Utilized:  Visual Auditory  Barriers to learning/adherence to lifestyle change: recently knee surgery  Demonstrated degree of understanding via:  Teach Back   Monitoring/Evaluation:  Dietary intake, exercise, lap band fills, and body weight. Follow up in 2 months for 12 month post-op visit.

## 2014-12-19 NOTE — Patient Instructions (Addendum)
Goals:  Follow Phase 3B: High Protein + Non-Starchy Vegetables  Eat 3-6 small meals/snacks, every 3-5 hrs  Increase lean protein foods to meet 60g goal  Increase fluid intake to 64oz +  Avoid drinking 15 minutes before, during and 30 minutes after eating  Aim for >30 min of physical activity daily  Try to take 20 minutes to eat meals. Stop eating after 30 minutes of eating.   Consider limiting extra carbs if weight loss slows - sweet potatoes, juice, popcorn, sugar in tea  Try not eating while working or watching TV

## 2015-02-18 ENCOUNTER — Ambulatory Visit: Payer: BC Managed Care – PPO | Admitting: Dietician

## 2015-03-11 ENCOUNTER — Ambulatory Visit: Payer: BC Managed Care – PPO | Admitting: Dietician

## 2015-03-27 ENCOUNTER — Ambulatory Visit: Payer: BC Managed Care – PPO | Admitting: Dietician

## 2015-05-01 ENCOUNTER — Ambulatory Visit: Payer: BC Managed Care – PPO | Admitting: Dietician

## 2015-06-03 ENCOUNTER — Encounter: Payer: BC Managed Care – PPO | Attending: Surgery | Admitting: Dietician

## 2015-06-03 ENCOUNTER — Encounter: Payer: Self-pay | Admitting: Dietician

## 2015-06-03 DIAGNOSIS — Z029 Encounter for administrative examinations, unspecified: Secondary | ICD-10-CM | POA: Diagnosis not present

## 2015-06-03 NOTE — Progress Notes (Signed)
  Follow-up visit:  16 months Post-Operative LAGB over RYGB Surgery  Medical Nutrition Therapy:  Appt start time: 340 end time: 405  Primary concerns today: Post-operative Bariatric Surgery Nutrition Management. Returns with a 12 lbs weight gain. States she has "been off balance" nutritionally since the holidays. Saw Dr. Hassell Done 3 weeks ago and did not get a band fill and has another appointment in 6 months. Still eating at her desk. States that she stops eating when she is full but will return to the meal later because she does not want to waste food. States that [employer] protein shake was causing "delayed memory." She usually drinks a protein powder from [employer]. However, she has not been doing this lately and has not been meeting protein needs.  Surgery date: 01/30/14  Surgery type: LAGB (banding RYGB pouch)  Start weight at [facility]: 290 lbs on 02/08/14  Weight today: 268.3 lbs Weight change: 12 lbs gain  Total weight lost: 22 lbs   Preferred Learning Style:   No preference indicated   Learning Readiness:   Ready   24-hr recall: B (AM): eggs and applesauce Snk (AM): [place] protein bar or skinny pop or light activia with walnuts or almonds (0-10g) L (PM):  salad with egg with nuts or chicken/tuna salad kit or chicken with vegetables sometimes with sweet potatoes with beneprotein and juice(20-27g) Snk (PM): sugar free popsicle or other snack D (PM): *see lunch (14-21g) Snk (PM): sugar free popsicle  Fluid intake: water, 10-12 oz half sweet tea, and 8 oz of protein in juice (95 oz + per day) Estimated total protein intake: 58-75 g per day  Medications: see list Supplementation: taking except for calcium  Drinking while eating: no Hair loss: no Carbonated beverages: every now and then N/V/D/C: "had the spit-ups" after eating too much too fast; regular bowel movements Dumping syndrome: none Last Lap-Band fill: no fill recently  Recent physical activity:  Works out 3 x week  at gym for 60 minutes  Progress Towards Goal(s):  In progress.    Nutritional Diagnosis:  Leonard-3.3 Overweight/obesity related to past poor dietary habits and physical inactivity as evidenced by patient w/ recent LAGB surgery following dietary guidelines for continued weight loss.     Intervention:  Nutrition counseling provided.  Teaching Method Utilized:  Visual Auditory  Barriers to learning/adherence to lifestyle change: recent knee surgery  Demonstrated degree of understanding via:  Teach Back   Monitoring/Evaluation:  Dietary intake, exercise, lap band fills, and body weight. Follow up in 2 months for 18 month post-op visit.

## 2015-06-03 NOTE — Patient Instructions (Addendum)
Goals:  Follow Phase 3B: High Protein + Non-Starchy Vegetables  Eat 3-6 small meals/snacks, every 3-5 hrs  Increase lean protein foods to meet 60g goal  Increase fluid intake to 64oz +  Avoid drinking 15 minutes before, during and 30 minutes after eating  Aim for >30 min of physical activity daily  Try to take 20 minutes to eat meals. Stop eating after 30 minutes of starting  Keep higher protein snacks on hand: [employer] [place] yogurt, mini nut packs, and protein shakes  Avoid tea  Re-develop your meal routine!   Keep preparing meals ahead of time  Consider limiting extra carbs if weight loss slows - sweet potatoes, juice, popcorn, sugar in tea  Try not eating while working or watching TV  Take leftovers to the unit fridge  Consider keeping a food journal to help with mindfulness

## 2015-06-12 ENCOUNTER — Other Ambulatory Visit: Payer: Self-pay | Admitting: Otolaryngology

## 2015-07-29 ENCOUNTER — Ambulatory Visit: Payer: BC Managed Care – PPO | Admitting: Dietician

## 2015-08-21 IMAGING — DX DG KNEE 1-2V PORT*R*
2 series · 2 of 2 positions shown · non-contrast
Comparison: 03/22/2014

CLINICAL DATA: Status post right knee replacement

EXAM:
PORTABLE RIGHT KNEE - 1-2 VIEW

[knee ap]
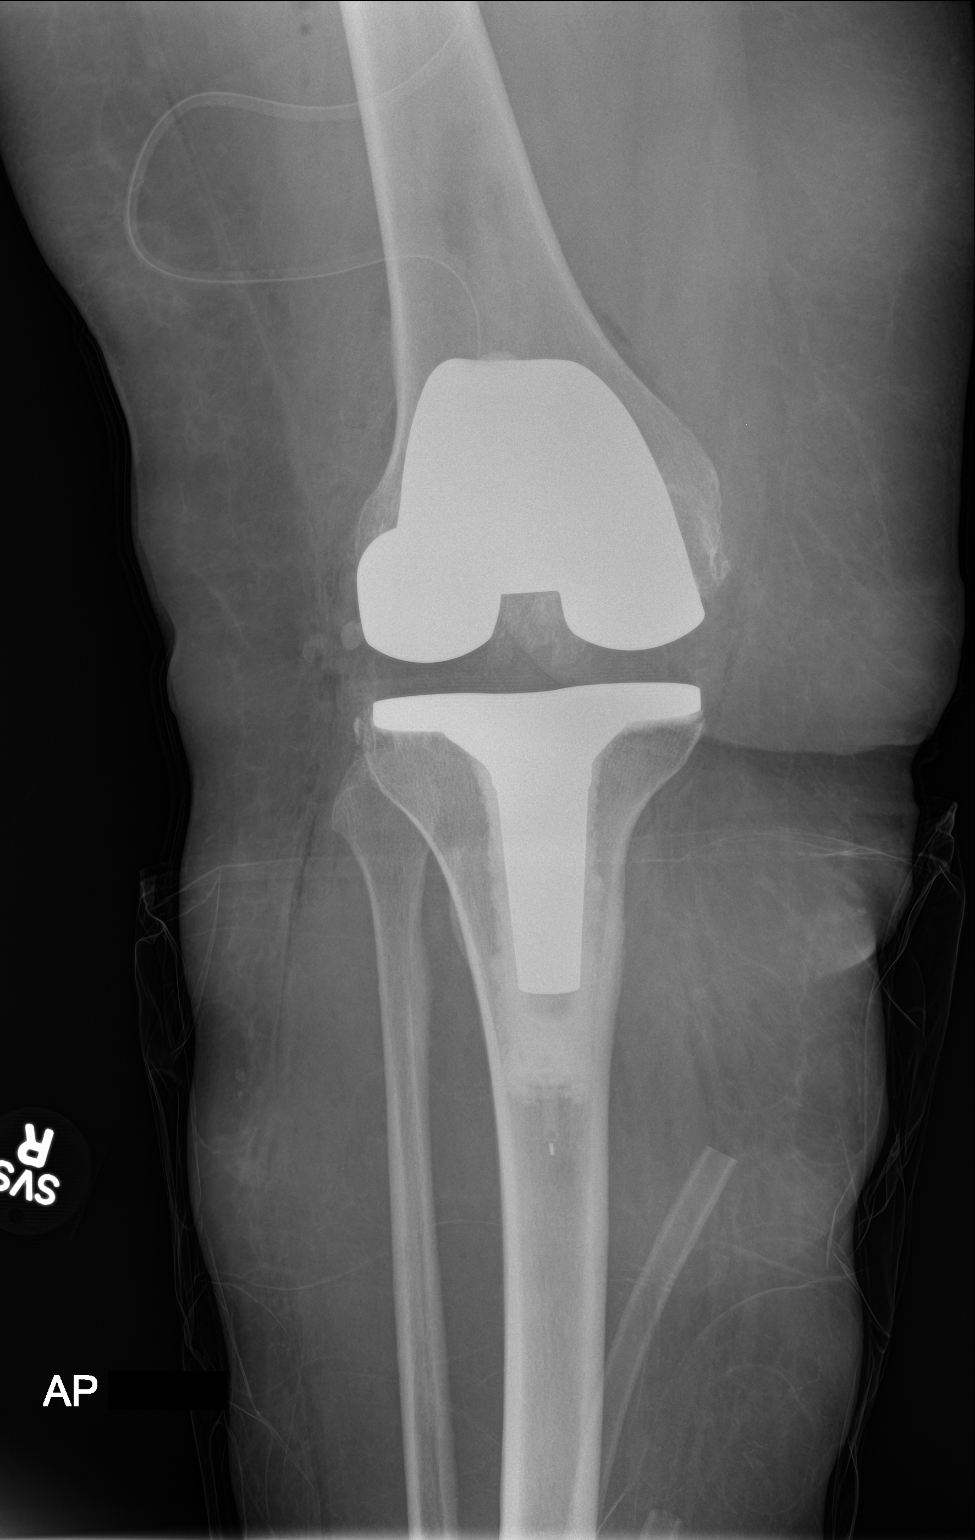

[knee lat]
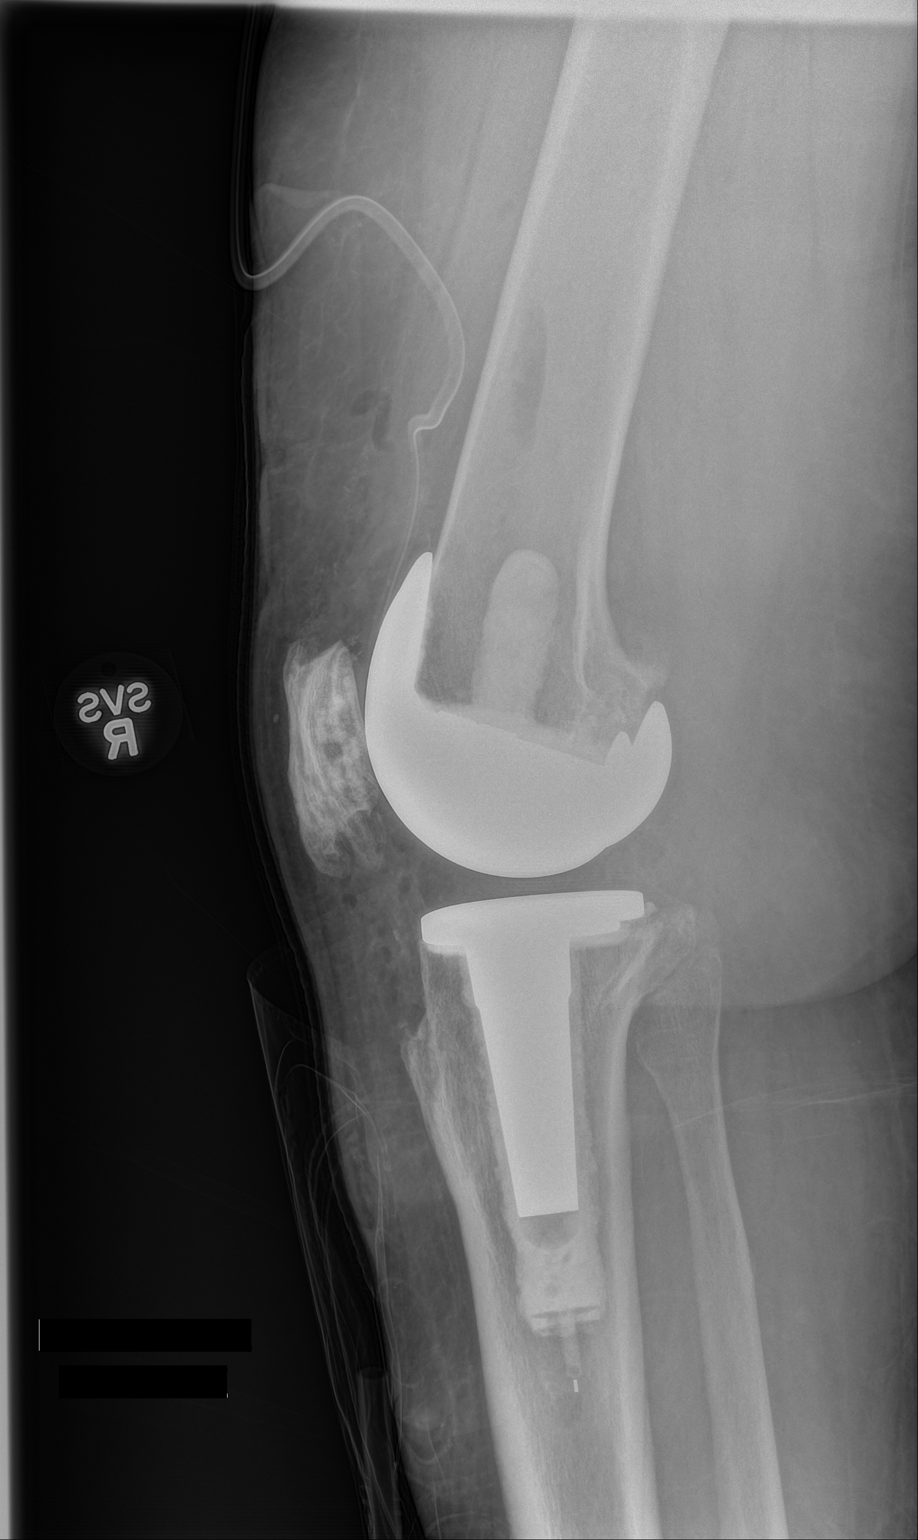

[2 of 2 positions shown; findings below may reference images not displayed]

FINDINGS: Two views of the right knee submitted. There is right knee
prosthesis in anatomic alignment. Postsurgical changes with
postsurgical drain in place. Small amount of periarticular soft
tissue air.
IMPRESSION: Right knee prosthesis in anatomic alignment.

## 2015-08-22 ENCOUNTER — Other Ambulatory Visit: Payer: Self-pay | Admitting: Gastroenterology

## 2015-08-22 DIAGNOSIS — R1013 Epigastric pain: Secondary | ICD-10-CM

## 2015-09-09 ENCOUNTER — Encounter (HOSPITAL_COMMUNITY)
Admission: RE | Admit: 2015-09-09 | Discharge: 2015-09-09 | Disposition: A | Discharge status: Home / Self Care | Payer: BC Managed Care – PPO | Source: Ambulatory Visit | Attending: Gastroenterology | Admitting: Gastroenterology

## 2015-09-09 ENCOUNTER — Ambulatory Visit (HOSPITAL_COMMUNITY)
Admission: RE | Admit: 2015-09-09 | Discharge: 2015-09-09 | Disposition: A | Discharge status: Home / Self Care | Payer: BC Managed Care – PPO | Source: Ambulatory Visit | Attending: Gastroenterology | Admitting: Gastroenterology

## 2015-09-09 DIAGNOSIS — R1013 Epigastric pain: Secondary | ICD-10-CM

## 2015-09-09 DIAGNOSIS — N200 Calculus of kidney: Secondary | ICD-10-CM | POA: Insufficient documentation

## 2015-09-09 MED ORDER — TECHNETIUM TC 99M MEBROFENIN IV KIT
5.2300 | PACK | Freq: Once | INTRAVENOUS | Status: AC | PRN
  Administered 2015-09-09: 5 via INTRAVENOUS

## 2016-07-22 ENCOUNTER — Emergency Department
Admission: EM | Admit: 2016-07-22 | Discharge: 2016-07-22 | Disposition: A | Discharge status: Home / Self Care | Payer: BC Managed Care – PPO | Source: Home / Self Care | Attending: Family Medicine | Admitting: Family Medicine

## 2016-07-22 ENCOUNTER — Encounter: Payer: Self-pay | Admitting: *Deleted

## 2016-07-22 DIAGNOSIS — R110 Nausea: Secondary | ICD-10-CM

## 2016-07-22 DIAGNOSIS — R1030 Lower abdominal pain, unspecified: Secondary | ICD-10-CM

## 2016-07-22 LAB — POCT CBC W AUTO DIFF (K'VILLE URGENT CARE)

## 2016-07-22 MED ORDER — ONDANSETRON 4 MG PO TBDP: ORAL_TABLET | ORAL | 0 refills | Status: DC

## 2016-07-22 NOTE — ED Triage Notes (Signed)
Pt c/o vomiting yesterday from 0600-1100. Today she reports epigastric pain and fatigue. Denies diarrhea, fever or chills.

## 2016-07-22 NOTE — Discharge Instructions (Signed)
Begin clear liquids (Pedialyte if diarrhea develops) for about 18 to 24 hours until improved, then advance to a [employer] (Bananas, Rice, Applesauce, Toast).  Then gradually resume a regular diet when tolerated.   If symptoms become significantly worse during the night or over the weekend, proceed to the local emergency room.

## 2016-07-22 NOTE — ED Provider Notes (Signed)
[facility]    CSN: [id number] Arrival date & time: 07/22/16  1853     History   Chief Complaint Chief Complaint  Patient presents with  . Emesis    HPI [name] is a 56 y.o. female.   Patient awoke early yesterday with a stomach ache, and then had nausea/vomiting at about 5:50am.  She had numerous episodes of nausea/vomiting yesterday, now improved except residual nausea.  She has a normal bowel movement this morning.  No urinary symptoms.  No fevers, chills, and sweats.  Her abdominal pain has improved. She has a past history of gastric bypass.   The history is provided by the patient.  Abdominal Pain  Pain location:  Epigastric and RLQ Pain quality: aching and bloating   Pain radiates to:  Does not radiate Duration:  1 day Timing:  Sporadic Progression:  Improving Chronicity:  New Context: awakening from sleep and previous surgery   Context: not recent travel and not suspicious food intake   Worsened by:  Nothing Ineffective treatments:  None tried Associated symptoms: anorexia, belching, fatigue, nausea and vomiting   Associated symptoms: no chills, no constipation, no cough, no diarrhea, no dysuria, no fever, no flatus, no hematemesis, no hematochezia, no hematuria, no melena, no shortness of breath, no sore throat, no vaginal bleeding and no vaginal discharge   Risk factors: multiple surgeries and obesity     Past Medical History:  Diagnosis Date  . Arthritis    knee   . Asthma   . DVT (deep venous thrombosis) ([place])   . Dysfunctional uterine bleeding   . GERD (gastroesophageal reflux disease)   . Glaucoma   . Headache(784.0)    MIGRAINES IN PAST  . Hypertension   . Morbid obesity ([place])   . PE (pulmonary embolism)   . Sleep apnea, obstructive    USES C-PAP    Patient Active Problem List   Diagnosis Date Noted  . Other headache syndrome 08/30/2014  . Speech disturbance 08/30/2014  . Vertigo 08/30/2014  . Right knee DJD 03/22/2014    . 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 Anemia 09/10/2011  . ARF (acute renal failure) ([place]) 09/10/2011  . Paresthesias 09/10/2011  . Total knee replacement status, left 09/10/2011  . Cervical spondylosis with myelopathy 06/30/2011  . Osteoarthritis of both knees 06/30/2011  . Diabetes mellitus   . Arthritis   . Sleep apnea, obstructive   . Morbid obesity ([place])   . Dysfunctional uterine bleeding   . HEPATITIS C 01/25/2008  . HYPERTENSION 01/25/2008  . 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 ASTHMA 01/25/2008  . SLEEP APNEA 01/25/2008  . DYSPNEA 01/25/2008    Past Surgical History:  Procedure Laterality Date  . ABDOMINAL HYSTERECTOMY    . CAPSULOTOMY  05/03/2012   Procedure: MINOR CAPSULOTOMY;  Surgeon: Myrtha Mantis., MD;  Location: [facility];  Service: Ophthalmology;  Laterality: Right;  . CAPSULOTOMY  05/05/2012   Procedure: MINOR CAPSULOTOMY;  Surgeon: Myrtha Mantis., MD;  Location: [facility];  Service: Ophthalmology;  Laterality: Right;  . CATARACT SURG    . DILATION AND CURETTAGE OF UTERUS    . ENDOMETRIAL ABLATION     failure   . EYE SURGERY  11/16/2009   FOR GLAUCOMA AND LASER SURGERY  X2  . GASTRIC BYPASS  [id number]  . GLAUCOMA SURGERY  10/31/2009  . HERNIA REPAIR    . HIATAL HERNIA REPAIR  01/30/2014  Procedure: HERNIA REPAIR HIATAL;  Surgeon: Pedro Earls, MD;  Location: [facility];  Service: General;;  . HYSTEROSCOPY    . IVC FILTER     2010 - THEN REMOVED THEN PLACED AGAIN 2012  . JOINT REPLACEMENT    . KNEE ARTHROSCOPY  12/2007  . LAPAROSCOPIC GASTRIC BANDING N/A 01/30/2014   Procedure: LAPAROSCOPIC GASTRIC BANDING over bypass ;  Surgeon: Pedro Earls, MD;  Location: [facility];  Service: General;  Laterality: N/A;  . POSTERIOR LAMINECTOMY / DECOMPRESSION CERVICAL SPINE  01/26/2011  . TOTAL KNEE ARTHROPLASTY  09/09/2011   Procedure: TOTAL KNEE ARTHROPLASTY;  Surgeon: Johnn Hai, MD;  Location: [facility];  Service: Orthopedics;   Laterality: Left;  . TOTAL KNEE ARTHROPLASTY Right 03/22/2014   Procedure: RIGHT TOTAL KNEE ARTHROPLASTY;  Surgeon: Johnn Hai, MD;  Location: [facility];  Service: Orthopedics;  Laterality: Right;  . UPPER GI ENDOSCOPY N/A 01/30/2014   Procedure: UPPER GI ENDOSCOPY;  Surgeon: Pedro Earls, MD;  Location: [facility];  Service: General;  Laterality: N/A;  . YAG LASER APPLICATION  9/38/1829   Procedure: YAG LASER APPLICATION;  Surgeon: Myrtha Mantis., MD;  Location: [place];  Service: Ophthalmology;  Laterality: Right;  . YAG LASER APPLICATION  9/37/1696   Procedure: YAG LASER APPLICATION;  Surgeon: Myrtha Mantis., MD;  Location: [place];  Service: Ophthalmology;  Laterality: Right;    OB History    No data available       Home Medications    Prior to Admission medications   Medication Sig Start Date End Date Taking? Authorizing Provider  ALPRAZolam Duanne Moron) 0.25 MG tablet Take 0.25 mg by mouth at bedtime as needed for sleep.    Yes Historical Provider, MD  bimatoprost (LUMIGAN) 0.01 % SOLN Place 1 drop into both eyes at bedtime.    Yes Lucianne Lei, MD  brimonidine (ALPHAGAN P) 0.1 % SOLN Place 1 drop into both eyes 2 (two) times daily.   Yes Historical Provider, MD  ciclesonide (ALVESCO) 160 MCG/ACT inhaler Inhale 1 puff into the lungs 2 (two) times daily.   Yes Historical Provider, MD  Cyanocobalamin (VITAMIN B-12) 2500 MCG SUBL Place 2 tablets under the tongue at bedtime.   Yes Historical Provider, MD  cyclobenzaprine (FLEXERIL) 10 MG tablet Take 10 mg by mouth at bedtime.    Yes Lucianne Lei, MD  diltiazem (DILACOR XR) 240 MG 24 hr capsule Take 240 mg by mouth at bedtime.   Yes Historical Provider, MD  esomeprazole (NEXIUM) 40 MG capsule Take 40 mg by mouth daily before breakfast.   Yes Historical Provider, MD  fexofenadine (ALLEGRA) 180 MG tablet Take 180 mg by mouth daily.   Yes Historical Provider, MD  Fluticasone Furoate-Vilanterol (BREO ELLIPTA IN) Inhale into the  lungs.   Yes Historical Provider, MD  magnesium chloride (SLOW-MAG) 64 MG TBEC Take 1-2 tablets by mouth 2 (two) times daily. Patient takes 2 tablet every morning and 1 tablet with dinner   Yes Historical Provider, MD  montelukast (SINGULAIR) 10 MG tablet Take 10 mg by mouth at bedtime.   Yes Historical Provider, MD  Multiple Vitamin (MULTIVITAMIN) tablet Take 1 tablet by mouth 2 (two) times daily.    Yes Historical Provider, MD  potassium chloride SA (K-DUR,KLOR-CON) 20 MEQ tablet Take 20-40 mEq by mouth 2 (two) times daily. 40 meq with breakfast and 20 meq with dinner   Yes Historical Provider, MD  quiNINE (QUALAQUIN) 324 MG capsule Take 324 mg  by mouth at bedtime.    Yes Lucianne Lei, MD  Rivaroxaban (XARELTO) 20 MG TABS Take 20 mg by mouth at bedtime.    Yes Historical Provider, MD  valsartan-hydrochlorothiazide (DIOVAN-HCT) 80-12.5 MG per tablet Take 1 tablet by mouth every morning.    Yes Historical Provider, MD  acetaminophen (TYLENOL) 500 MG tablet Take 1,000 mg by mouth every 4 (four) hours as needed for moderate pain.    Historical Provider, MD  Ascorbic Acid (VITAMIN C) 1000 MG tablet Take 1,000 mg by mouth every morning.     Historical Provider, MD  b complex vitamins tablet Take 1 tablet by mouth every morning.     Historical Provider, MD  beclomethasone (QVAR) 80 MCG/ACT inhaler Inhale 2 puffs into the lungs 2 (two) times daily.    Historical Provider, MD  brinzolamide (AZOPT) 1 % ophthalmic suspension Place 1 drop into both eyes 2 (two) times daily.     Lucianne Lei, MD  Calcium Carbonate-Vit D-Min (CALTRATE 600+D PLUS MINERALS PO) Take 1 tablet by mouth 3 (three) times daily.     Historical Provider, MD  celecoxib (CELEBREX) 200 MG capsule Take 200 mg by mouth every morning.     Historical Provider, MD  Docosahexaenoic Acid (DHA PO) Take 1 tablet by mouth 2 (two) times daily.    Historical Provider, MD  fish oil-omega-3 fatty acids 1000 MG capsule Take 1 g by mouth 2 (two) times daily.      Historical Provider, MD  iron polysaccharides (NIFEREX) 150 MG capsule Take 150 mg by mouth 2 (two) times daily.    Historical Provider, MD  levalbuterol Penne Lash) 1.25 MG/3ML nebulizer solution Take 1.25 mg by nebulization every 6 (six) hours as needed for wheezing or shortness of breath.     Historical Provider, MD  mometasone (NASONEX) 50 MCG/ACT nasal spray Place 2 sprays into the nose daily as needed (allergies).     Historical Provider, MD  niacin 500 MG tablet Take 500 mg by mouth daily with breakfast. Needs to be flush free per patient    Lucianne Lei, MD  ondansetron (ZOFRAN ODT) 4 MG disintegrating tablet Take one tab by mouth Q6hr prn nausea.  Dissolve under tongue. 07/22/16   Kandra Nicolas, MD  PAZEO 0.7 % SOLN  07/11/14   Historical Provider, MD  pyridOXINE (VITAMIN B-6) 100 MG tablet Take 100 mg by mouth every morning.     Historical Provider, MD  sodium chloride (OCEAN) 0.65 % SOLN nasal spray Place 2 sprays into both nostrils 2 (two) times daily.    Historical Provider, MD  Vitamin D, Ergocalciferol, (DRISDOL) 50000 UNITS CAPS capsule Take 50,000 Units by mouth every 7 (seven) days. Patient takes on Saturday 12/06/12   Historical Provider, MD    Family History Family History  Problem Relation Age of Onset  . Arthritis Mother   . Hypertension Mother   . Diabetes Mother   . Arthritis Father   . Diabetes Father   . Alcohol abuse Father   . Glaucoma    . Cerebral aneurysm    . Stroke      Social History Social History  Substance Use Topics  . Smoking status: Never Smoker  . Smokeless tobacco: Never Used  . Alcohol use No     Allergies   Beta adrenergic blockers; Hydrocodone; Penicillins; Sulfa antibiotics; and Thimerosal   Review of Systems Review of Systems  Constitutional: Positive for fatigue. Negative for chills and fever.  HENT: Negative for sore throat.  Respiratory: Negative for cough and shortness of breath.   Gastrointestinal: Positive for abdominal  pain, anorexia, nausea and vomiting. Negative for constipation, diarrhea, flatus, hematemesis, hematochezia and melena.  Genitourinary: Negative for dysuria, hematuria, vaginal bleeding and vaginal discharge.  All other systems reviewed and are negative.    Physical Exam Triage Vital Signs ED Triage Vitals  Enc Vitals Group     BP 07/22/16 1921 (!) 146/87     Pulse Rate 07/22/16 1921 78     Resp 07/22/16 1921 18     Temp 07/22/16 1921 97.8 F (36.6 C)     Temp Source 07/22/16 1921 Oral     SpO2 07/22/16 1921 98 %     Weight 07/22/16 1921 285 lb (129.3 kg)     Height 07/22/16 1921 5' 4.75" (1.645 m)     Head Circumference --      Peak Flow --      Pain Score 07/22/16 1922 0     Pain Loc --      Pain Edu? --      Excl. in [place]? --    No data found.   Updated Vital Signs BP (!) 146/87 (BP Location: Left Arm)   Pulse 78   Temp 97.8 F (36.6 C) (Oral)   Resp 18   Ht 5' 4.75" (1.645 m)   Wt 285 lb (129.3 kg)   LMP 07/26/2011   SpO2 98%   BMI 47.79 kg/m   Visual Acuity Right Eye Distance:   Left Eye Distance:   Bilateral Distance:    Right Eye Near:   Left Eye Near:    Bilateral Near:     Physical Exam  Constitutional: She appears well-developed and well-nourished. No distress.  HENT:  Head: Normocephalic.  Right Ear: External ear normal.  Left Ear: External ear normal.  Nose: Nose normal.  Mouth/Throat: Oropharynx is clear and moist.  Eyes: Pupils are equal, round, and reactive to light.  Neck: Neck supple.  Cardiovascular: Normal heart sounds.   Pulmonary/Chest: Breath sounds normal.  Abdominal: Soft. She exhibits no distension and no mass. Bowel sounds are decreased. There is tenderness in the right upper quadrant, right lower quadrant, periumbilical area and left lower quadrant. There is no rebound and no guarding.    Patient has mild tenderness to palpation right upper quadrant, right lower quadrant, and left lower quadrant as noted on diagram.  Patient's  body habitus prevents adequate abdominal exam.  Musculoskeletal: She exhibits no edema.  Lymphadenopathy:    She has no cervical adenopathy.  Neurological: She is alert.  Skin: Skin is warm and dry. No rash noted.  Nursing note and vitals reviewed.    UC Treatments / Results  Labs (all labs ordered are listed, but only abnormal results are displayed)  Labs Reviewed -  POCT CBC:  WBC 4.6; LY 69.6; MO 7.0; GR 23.4; Hgb 12.6; Platelets 165   EKG  EKG Interpretation None       Radiology No results found.  Procedures Procedures (including critical care time)  Medications Ordered in UC Medications - No data to display   Initial Impression / Assessment and Plan / UC Course  I have reviewed the triage vital signs and the nursing notes.  Pertinent labs & imaging results that were available during my care of the patient were reviewed by me and considered in my medical decision making (see chart for details).    Normal WBC (4.6) reassuring. Rx for Zofran ODT 4mg . Begin clear liquids (  Pedialyte if diarrhea develops) for about 18 to 24 hours until improved, then advance to a [employer] (Bananas, Rice, Applesauce, Toast).  Then gradually resume a regular diet when tolerated.   If symptoms become significantly worse during the night or over the weekend, proceed to the local emergency room.  Followup with Family Doctor if not improved in two days.   Final Clinical Impressions(s) / UC Diagnoses   Final diagnoses:  Nausea  Lower abdominal pain    New Prescriptions Discharge Medication List as of 07/22/2016  8:10 PM    START taking these medications   Details  ondansetron (ZOFRAN ODT) 4 MG disintegrating tablet Take one tab by mouth Q6hr prn nausea.  Dissolve under tongue., Normal         Kandra Nicolas, MD 07/27/16 1431

## 2017-10-07 ENCOUNTER — Encounter (INDEPENDENT_AMBULATORY_CARE_PROVIDER_SITE_OTHER): Payer: BC Managed Care – PPO

## 2017-11-04 ENCOUNTER — Ambulatory Visit (INDEPENDENT_AMBULATORY_CARE_PROVIDER_SITE_OTHER): Payer: BC Managed Care – PPO | Admitting: Family Medicine

## 2017-11-04 ENCOUNTER — Encounter (INDEPENDENT_AMBULATORY_CARE_PROVIDER_SITE_OTHER): Payer: Self-pay | Admitting: Family Medicine

## 2017-11-04 VITALS — BP 112/75 | HR 76 | Ht 64.0 in | Wt 272.0 lb

## 2017-11-04 DIAGNOSIS — R5383 Other fatigue: Secondary | ICD-10-CM | POA: Diagnosis not present

## 2017-11-04 DIAGNOSIS — Z9189 Other specified personal risk factors, not elsewhere classified: Secondary | ICD-10-CM

## 2017-11-04 DIAGNOSIS — I10 Essential (primary) hypertension: Secondary | ICD-10-CM | POA: Diagnosis not present

## 2017-11-04 DIAGNOSIS — Z1331 Encounter for screening for depression: Secondary | ICD-10-CM | POA: Diagnosis not present

## 2017-11-04 DIAGNOSIS — R0602 Shortness of breath: Secondary | ICD-10-CM | POA: Diagnosis not present

## 2017-11-04 DIAGNOSIS — Z6841 Body Mass Index (BMI) 40.0 and over, adult: Secondary | ICD-10-CM

## 2017-11-04 DIAGNOSIS — E559 Vitamin D deficiency, unspecified: Secondary | ICD-10-CM

## 2017-11-04 DIAGNOSIS — Z0289 Encounter for other administrative examinations: Secondary | ICD-10-CM

## 2017-11-04 DIAGNOSIS — E119 Type 2 diabetes mellitus without complications: Secondary | ICD-10-CM

## 2017-11-04 NOTE — Progress Notes (Signed)
Office: [phone]  /  Fax: [phone]   Dear Dr. Criss Rosales,   Thank you for referring [name] to our clinic. The following note includes my evaluation and treatment recommendations.  HPI:   Chief Complaint: OBESITY    [name] has been referred by Erin Lei, MD for consultation regarding her obesity and obesity related comorbidities.    [name] (MR# [id number]) is a 57 y.o. female who presents on 11/04/2017 for obesity evaluation and treatment. Current BMI is Body mass index is 46.69 kg/m.Marland Kitchen [name] has been struggling with her weight for many years and has been unsuccessful in either losing weight, maintaining weight loss, or reaching her healthy weight goal.     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 She has gone from 437 lbs to 242 lbs in 1 year, started slowly regaining, had band but didn'[name] see much weight loss.     [name] attended our information session and states she is currently in the action [name] of change and ready to dedicate time achieving and maintaining a healthier weight. [name] is interested in becoming our patient and working on intensive lifestyle modifications including (but not limited to) diet, exercise and weight loss.    [name] states her desired weight loss is 87 lbs she has been heavy most of  her life she started gaining weight after father's death, BF moved away, and mom passed away her heaviest weight ever was 459 lbs she is a picky eater and doesn'[name] like to eat healthier foods  she has significant food cravings issues  she snacks frequently in the evenings she skips meals frequently she is frequently drinking liquids with calories she frequently makes poor food choices she struggles with emotional eating    Fatigue [name] feels her energy is lower than it should be. This has worsened with weight gain and has not worsened recently. [name] admits to daytime somnolence and  admits to waking up still tired.  Patient has a history of obstructive sleep apnea with the use of CPAP. Patent has a history of symptoms of daytime fatigue. Patient generally gets 6 hours of sleep per night, and states they generally have generally restful sleep. Snoring is present. Apneic episodes are present. Epworth Sleepiness Score is 6.  Dyspnea on exertion [name] notes increasing shortness of breath with exercising and seems to be worsening over time with weight gain. She notes getting out of breath sooner with activity than she used to. This has not gotten worse recently. [name] denies orthopnea.  Diabetes II [name] has a diagnosis of diabetes type II. [name] has been attempting to diet control since weight loss surgery. She is not on medications and she notes polyphagia.   Hypertension [name] is a 57 y.o. female with hypertension. [name]'s blood pressure is stable on medications and she denies chest pain. She would like to improve with diet and exercise with the goal of decreasing her risk of heart attack and stroke. [name]'s blood pressure is currently controlled.  At risk for cardiovascular disease [name] is at a higher than average risk for cardiovascular disease due to obesity, diabetes II, and hypertension. She currently denies any chest pain.  Vitamin D Deficiency [name] has a diagnosis of vitamin D deficiency. She is on Vit D, no recent labs. She notes fatigue and denies nausea, vomiting or muscle weakness.  Depression Screen [name]'s Food and Mood (modified PHQ-9) score was  Depression screen [facility] 2/9 11/04/2017  Decreased Interest 1  Down, Depressed, Hopeless 0  PHQ - 2 Score 1  Altered sleeping 0  Tired, decreased energy 1  Change in appetite 2  Feeling bad or failure about yourself  0  Trouble concentrating 2  Moving slowly or fidgety/restless 0  Suicidal thoughts 0  PHQ-9 Score 6  Difficult doing work/chores Not difficult at all    ALLERGIES: Allergies  Allergen Reactions  . Beta  Adrenergic Blockers Other (See Comments)    RED EYES AND CONGESTION  . Hydrocodone   . Penicillins Hives, Itching and Swelling  . Sulfa Antibiotics Hives    RED EYES  . Thimerosal Hives and Itching    MEDICATIONS: Current Outpatient Medications on File Prior to Visit  Medication Sig Dispense Refill  . acetaminophen (TYLENOL) 500 MG tablet Take 1,000 mg by mouth every 4 (four) hours as needed for moderate pain.    . Ascorbic Acid (VITAMIN C) 1000 MG tablet Take 1,000 mg by mouth every morning.     Marland Kitchen b complex vitamins tablet Take 1 tablet by mouth every morning.     . brimonidine (ALPHAGAN) 0.15 % ophthalmic solution Place 1 drop into both eyes 2 (two) times daily.     . brinzolamide (AZOPT) 1 % ophthalmic suspension Place 1 drop into both eyes 2 (two) times daily.     . budesonide-formoterol (SYMBICORT) 160-4.5 MCG/ACT inhaler Inhale 2 puffs into the lungs 2 (two) times daily.    . Calcium Carbonate-Vit D-Min (CALTRATE 600+D PLUS MINERALS PO) Take 1 tablet by mouth 3 (three) times daily.     . Cyanocobalamin (VITAMIN B-12) 2500 MCG SUBL Place 2 tablets under the tongue at bedtime.    . cyclobenzaprine (FLEXERIL) 10 MG tablet Take 10 mg by mouth at bedtime.     Marland Kitchen diltiazem (DILACOR XR) 240 MG 24 hr capsule Take 240 mg by mouth at bedtime.    . Docosahexaenoic Acid (DHA PO) Take 1 tablet by mouth 2 (two) times daily.    Marland Kitchen esomeprazole (NEXIUM) 40 MG capsule Take 40 mg by mouth daily before breakfast.    . fexofenadine (ALLEGRA) 180 MG tablet Take 180 mg by mouth daily.    . fish oil-omega-3 fatty acids 1000 MG capsule Take 1 g by mouth 2 (two) times daily.     . Fluticasone Furoate-Vilanterol (BREO ELLIPTA IN) Inhale into the lungs.    . furosemide (LASIX) 40 MG tablet Take 40 mg by mouth.    . iron polysaccharides (NIFEREX) 150 MG capsule Take 150 mg by mouth 2 (two) times daily.    Marland Kitchen levalbuterol (XOPENEX) 1.25 MG/3ML nebulizer solution Take 1.25 mg by nebulization every 6 (six) hours as  needed for wheezing or shortness of breath.     . magnesium chloride (SLOW-MAG) 64 MG TBEC Take 1-2 tablets by mouth 2 (two) times daily. Patient takes 2 tablet every morning and 1 tablet with dinner    . mometasone (NASONEX) 50 MCG/ACT nasal spray Place 2 sprays into the nose daily as needed (allergies).     . montelukast (SINGULAIR) 10 MG tablet Take 10 mg by mouth at bedtime.    . Multiple Vitamin (MULTIVITAMIN) tablet Take 1 tablet by mouth 2 (two) times daily.     . niacin 500 MG tablet Take 500 mg by mouth daily with breakfast. Needs to be flush free per patient    . potassium chloride SA (K-DUR,KLOR-CON) 20 MEQ tablet Take 20-40 mEq by mouth 2 (two) times daily. 40 meq with breakfast and 20 meq  with dinner    . Propylene Glycol (SYSTANE BALANCE) 0.6 % SOLN Apply to eye.    . pyridOXINE (VITAMIN B-6) 100 MG tablet Take 100 mg by mouth every morning.     . quiNINE (QUALAQUIN) 324 MG capsule Take 324 mg by mouth at bedtime.     . Rivaroxaban (XARELTO) 20 MG TABS Take 20 mg by mouth at bedtime.     . sodium chloride (OCEAN) 0.65 % SOLN nasal spray Place 2 sprays into both nostrils 2 (two) times daily.    . valsartan-hydrochlorothiazide (DIOVAN-HCT) 80-12.5 MG per tablet Take 1 tablet by mouth every morning.     . Vitamin D, Ergocalciferol, (DRISDOL) 50000 UNITS CAPS capsule Take 50,000 Units by mouth every 7 (seven) days. Patient takes on Saturday    . PAZEO 0.7 % SOLN      No current facility-administered medications on file prior to visit.     PAST MEDICAL HISTORY: Past Medical History:  Diagnosis Date  . Anemia   . Anxiety   . Arthritis    knee   . Asthma   . Back pain   . Depression   . Diabetes ([place])   . DVT (deep venous thrombosis) ([place])   . Dysfunctional uterine bleeding   . Dyspnea   . GERD (gastroesophageal reflux disease)   . Glaucoma   . Headache(784.0)    MIGRAINES IN PAST  . Hypertension   . Hypothyroidism   . Joint pain   . Lactose intolerance   . Leg edema     . Morbid obesity ([place])   . Multiple food allergies   . Optic neuropathy   . Osteoarthritis   . PE (pulmonary embolism)   . Sleep apnea, obstructive    USES C-PAP    PAST SURGICAL HISTORY: Past Surgical History:  Procedure Laterality Date  . ABDOMINAL HYSTERECTOMY    . CAPSULOTOMY  05/03/2012   Procedure: MINOR CAPSULOTOMY;  Surgeon: Myrtha Mantis., MD;  Location: [place];  Service: Ophthalmology;  Laterality: Right;  . CAPSULOTOMY  05/05/2012   Procedure: MINOR CAPSULOTOMY;  Surgeon: Myrtha Mantis., MD;  Location: [place];  Service: Ophthalmology;  Laterality: Right;  . CATARACT SURG    . DILATION AND CURETTAGE OF UTERUS    . ENDOMETRIAL ABLATION     failure   . EYE SURGERY  11/16/2009   FOR GLAUCOMA AND LASER SURGERY  X2  . GASTRIC BYPASS  [id number]  . GLAUCOMA SURGERY  10/31/2009  . HERNIA REPAIR    . HIATAL HERNIA REPAIR  01/30/2014   Procedure: HERNIA REPAIR HIATAL;  Surgeon: Pedro Earls, MD;  Location: [facility];  Service: General;;  . HYSTEROSCOPY    . IVC FILTER     2010 - THEN REMOVED THEN PLACED AGAIN 2012  . JOINT REPLACEMENT    . KNEE ARTHROSCOPY  12/2007  . LAPAROSCOPIC GASTRIC BANDING N/A 01/30/2014   Procedure: LAPAROSCOPIC GASTRIC BANDING over bypass ;  Surgeon: Pedro Earls, MD;  Location: [facility];  Service: General;  Laterality: N/A;  . POSTERIOR LAMINECTOMY / DECOMPRESSION CERVICAL SPINE  01/26/2011  . TOTAL KNEE ARTHROPLASTY  09/09/2011   Procedure: TOTAL KNEE ARTHROPLASTY;  Surgeon: Johnn Hai, MD;  Location: [facility];  Service: Orthopedics;  Laterality: Left;  . TOTAL KNEE ARTHROPLASTY Right 03/22/2014   Procedure: RIGHT TOTAL KNEE ARTHROPLASTY;  Surgeon: Johnn Hai, MD;  Location: [facility];  Service: Orthopedics;  Laterality: Right;  . UPPER GI ENDOSCOPY N/A 01/30/2014  Procedure: UPPER GI ENDOSCOPY;  Surgeon: Pedro Earls, MD;  Location: [facility];  Service: General;  Laterality: N/A;  . YAG LASER APPLICATION  1/61/0960    Procedure: YAG LASER APPLICATION;  Surgeon: Myrtha Mantis., MD;  Location: [place];  Service: Ophthalmology;  Laterality: Right;  . YAG LASER APPLICATION  4/54/0981   Procedure: YAG LASER APPLICATION;  Surgeon: Myrtha Mantis., MD;  Location: [place];  Service: Ophthalmology;  Laterality: Right;    SOCIAL HISTORY: Social History   Tobacco Use  . Smoking status: Never Smoker  . Smokeless tobacco: Never Used  Substance Use Topics  . Alcohol use: No  . Drug use: No    FAMILY HISTORY: Family History  Problem Relation Age of Onset  . Arthritis Mother   . Hypertension Mother   . Diabetes Mother   . Stroke Mother   . Obesity Mother   . Arthritis Father   . Diabetes Father   . Alcohol abuse Father   . Hypertension Father   . Sudden death Father   . Glaucoma Unknown   . Cerebral aneurysm Unknown   . Stroke Unknown     ROS: Review of Systems  Constitutional: Positive for malaise/fatigue. Negative for weight loss.  HENT: Positive for nosebleeds and tinnitus.        + Decreased hearing  Eyes:       + Vision changes + Wear glasses or contacts  Respiratory: Positive for shortness of breath.   Cardiovascular: Negative for chest pain and orthopnea.  Gastrointestinal: Positive for heartburn.  Endo/Heme/Allergies: Bruises/bleeds easily.       Positive polyphagia    PHYSICAL EXAM: Blood pressure 112/75, pulse 76, height 5\' 4"  (1.626 m), weight 272 lb (123.4 kg), last menstrual period 07/26/2011, SpO2 100 %. Body mass index is 46.69 kg/m. Physical Exam  Constitutional: She is oriented to person, place, and time. She appears well-developed and well-nourished.  HENT:  Head: Normocephalic and atraumatic.  Nose: Nose normal.  Eyes: EOM are normal. No scleral icterus.  Neck: Normal range of motion. Neck supple. No thyromegaly present.  Cardiovascular: Normal rate and regular rhythm.  Pulmonary/Chest: Effort normal. No respiratory distress.  Abdominal: Soft. There is  no tenderness.  + Obesity  Musculoskeletal:  Range of Motion normal in all 4 extremities Trace edema noted in bilateral lower extremities  Neurological: She is alert and oriented to person, place, and time. Coordination normal.  Skin: Skin is warm and dry.  Psychiatric: She has a normal mood and affect. Her behavior is normal.  Vitals reviewed.   RECENT LABS AND TESTS: BMET    Component Value Date/Time   NA 141 03/23/2014 0435   K 3.8 03/23/2014 0435   CL 105 03/23/2014 0435   CO2 25 03/23/2014 0435   GLUCOSE 159 (H) 03/23/2014 0435   BUN 12 03/23/2014 0435   CREATININE 0.95 03/23/2014 0435   CALCIUM 9.2 03/23/2014 0435   GFRNONAA 66 (L) 03/23/2014 0435   GFRAA 77 (L) 03/23/2014 0435   Lab Results  Component Value Date   HGBA1C 5.7 (H) 01/25/2013   No results found for: INSULIN CBC    Component Value Date/Time   WBC 6.3 03/25/2014 0446   RBC 3.65 (L) 03/25/2014 0446   HGB 9.8 (L) 03/25/2014 0446   HGB 13.1 12/20/2007 1044   HCT 28.7 (L) 03/25/2014 0446   HCT 39.1 12/20/2007 1044   PLT 128 (L) 03/25/2014 0446   PLT 154 12/20/2007 1044   MCV  78.6 03/25/2014 0446   MCV 84.3 12/20/2007 1044   MCH 26.8 03/25/2014 0446   MCHC 34.1 03/25/2014 0446   RDW 16.2 (H) 03/25/2014 0446   RDW 15.6 (H) 12/20/2007 1044   LYMPHSABS 2.4 02/01/2014 0445   LYMPHSABS 1.9 12/20/2007 1044   MONOABS 0.6 02/01/2014 0445   MONOABS 0.5 12/20/2007 1044   EOSABS 0.0 02/01/2014 0445   EOSABS 0.2 12/20/2007 1044   BASOSABS 0.0 02/01/2014 0445   BASOSABS 0.1 12/20/2007 1044   Iron/TIBC/Ferritin/ %Sat    Component Value Date/Time   IRON 55 01/18/2011 0946   TIBC 291 01/18/2011 0946   FERRITIN 25 01/18/2011 0946   IRONPCTSAT 19 (L) 01/18/2011 0946   Lipid Panel     Component Value Date/Time   CHOL  08/09/2007 0039    164        ATP III CLASSIFICATION:  <200     mg/dL   Desirable  200-239  mg/dL   Borderline High  >=240    mg/dL   High   TRIG 161 (H) 08/09/2007 0039   HDL 28 (L)  08/09/2007 0039   CHOLHDL 5.9 08/09/2007 0039   VLDL 32 08/09/2007 0039   LDLCALC (H) 08/09/2007 0039    104        Total Cholesterol/HDL:CHD Risk Coronary Heart Disease Risk Table                     Men   Women  1/2 Average Risk   3.4   3.3   Hepatic Function Panel     Component Value Date/Time   PROT 5.8 (L) 09/16/2011 0620   ALBUMIN 2.3 (L) 09/16/2011 0620   AST 17 09/16/2011 0620   ALT 18 09/16/2011 0620   ALKPHOS 51 09/16/2011 0620   BILITOT 0.4 09/16/2011 0620     ECG  shows NSR with a rate of 76 BPM INDIRECT CALORIMETER done today shows a VO2 of 235 and a REE of 1633.  Her calculated basal metabolic rate is 6789 thus her basal metabolic rate is worse than expected.    ASSESSMENT AND PLAN: Other fatigue - Plan: EKG 12-Lead, Vitamin B12, CBC With Differential, Folate, Lipid Panel With LDL/HDL Ratio, T3, T4, free, TSH  Shortness of breath on exertion - Plan: CBC With Differential  Type 2 diabetes mellitus without complication, without long-term current use of insulin (HCC) - Plan: Comprehensive metabolic panel, Hemoglobin A1c, Insulin, random  Essential hypertension  Vitamin D deficiency - Plan: VITAMIN D 25 Hydroxy (Vit-D Deficiency, Fractures)  Depression screening  At risk for heart disease  Class 3 severe obesity with serious comorbidity and body mass index (BMI) of 45.0 to 49.9 in adult, unspecified obesity type ([place])  PLAN:  Fatigue Shamarie was informed that her fatigue may be related to obesity, depression or many other causes. Labs will be ordered, and in the meanwhile [name] has agreed to work on diet, exercise and weight loss to help with fatigue. Proper sleep hygiene was discussed including the need for 7-8 hours of quality sleep each night. A sleep study was not ordered based on symptoms and Epworth score.  Dyspnea on exertion [name]'s shortness of breath appears to be obesity related and exercise induced. She has agreed to work on weight loss and  gradually increase exercise to treat her exercise induced shortness of breath. If Vaughn follows our instructions and loses weight without improvement of her shortness of breath, we will plan to refer to pulmonology. We will monitor this  condition regularly. Emberly agrees to this plan.  Diabetes II [name] has been given extensive diabetes education by myself today including ideal fasting and post-prandial blood glucose readings, individual ideal Hgb A1c goals and hypoglycemia prevention. We discussed the importance of good blood sugar control to decrease the likelihood of diabetic complications such as nephropathy, neuropathy, limb loss, blindness, coronary artery disease, and death. We discussed the importance of intensive lifestyle modification including diet, exercise and weight loss as the first line treatment for diabetes. Ruchi will start diet prescription, we will check labs, and she agrees to follow up with myself and Dr. Mallie Mussel, our bariatric psychologist in 2 weeks.  Hypertension We discussed sodium restriction, working on healthy weight loss, and a regular exercise program as the means to achieve improved blood pressure control. [name] agreed with this plan and agreed to follow up as directed. We will continue to monitor her blood pressure as well as her progress with the above lifestyle modifications. She will start diet prescription, and will continue her medications and will watch for signs of hypotension as she continues her lifestyle modifications. We will check labs and Telissa agrees to follow up with myself and Dr. Mallie Mussel, our bariatric psychologist in 2 weeks.  Cardiovascular risk counselling [name] was given extended (15 minutes) coronary artery disease prevention counseling today. She is 57 y.o. female and has risk factors for heart disease including obesity, diabetes II, and hypertension. We discussed intensive lifestyle modifications today with an emphasis on specific weight loss  instructions and strategies. Pt was also informed of the importance of increasing exercise and decreasing saturated fats to help prevent heart disease.  Vitamin D Deficiency [name] was informed that low vitamin D levels contributes to fatigue and are associated with obesity, breast, and colon cancer. She will follow up for routine testing of vitamin D, at least 2-3 times per year. She was informed of the risk of over-replacement of vitamin D and agrees to not increase her dose unless she discusses this with [name] first. We will check labs and Tuwana agrees to follow up with myself and Dr. Mallie Mussel, our bariatric psychologist in 2 weeks.  Depression Screen Aayra had a mildly positive depression screening. Depression is commonly associated with obesity and often results in emotional eating behaviors. We will monitor this closely and work on CBT to help improve the non-hunger eating patterns. Referral to Psychology may be required if no improvement is seen as she continues in our clinic.  Obesity Solash is currently in the action [name] of change and her goal is to continue with weight loss efforts. I recommend Jessi begin the structured treatment plan as follows:  She has agreed to follow the Category 2 plan Dhyana has been instructed to eventually work up to a goal of 150 minutes of combined cardio and strengthening exercise per week for weight loss and overall health benefits. We discussed the following Behavioral Modification Strategies today: increasing lean protein intake, decreasing simple carbohydrates  and work on meal planning and easy cooking plans   She was informed of the importance of frequent follow up visits to maximize her success with intensive lifestyle modifications for her multiple health conditions. She was informed we would discuss her lab results at her next visit unless there is a critical issue that needs to be addressed sooner. Lyne agreed to keep her next visit at the agreed  upon time to discuss these results.    OBESITY BEHAVIORAL INTERVENTION VISIT  Today's visit was # 1 out of  22.  Starting weight: 272 lbs Starting date: 11/04/17 Today's weight : 272 lbs Today's date: 11/04/2017 Total lbs lost to date: 0 (Patients must lose 7 lbs in the first 6 months to continue with counseling)   ASK: We discussed the diagnosis of obesity with Nea Gittens Lincks today and Katieann agreed to give [name] permission to discuss obesity behavioral modification therapy today.  ASSESS: Billie has the diagnosis of obesity and her BMI today is 59.67 Kamisha is in the action [name] of change   ADVISE: Tiarra was educated on the multiple health risks of obesity as well as the benefit of weight loss to improve her health. She was advised of the need for long term treatment and the importance of lifestyle modifications.  AGREE: Multiple dietary modification options and treatment options were discussed and  Lakina agreed to the above obesity treatment plan.   I, Trixie Dredge, am acting as transcriptionist for Dennard Nip, MD  I have reviewed the above documentation for accuracy and completeness, and I agree with the above. -Dennard Nip, MD

## 2017-11-05 LAB — T3: T3, Total: 111 ng/dL (ref 71–180)

## 2017-11-05 LAB — VITAMIN B12: Vitamin B-12: >2000 pg/mL — ABNORMAL HIGH (ref 232–1245)

## 2017-11-05 LAB — CBC WITH DIFFERENTIAL
Basophils Absolute: 0 10*3/uL (ref 0.0–0.2)
Basos: 1 %
EOS (ABSOLUTE): 0.4 10*3/uL (ref 0.0–0.4)
Eos: 8 %
Hematocrit: 36.5 % (ref 34.0–46.6)
Hemoglobin: 12.3 g/dL (ref 11.1–15.9)
Immature Grans (Abs): 0 10*3/uL (ref 0.0–0.1)
Immature Granulocytes: 0 %
Lymphocytes Absolute: 2.1 10*3/uL (ref 0.7–3.1)
Lymphs: 41 %
MCH: 26.9 pg (ref 26.6–33.0)
MCHC: 33.7 g/dL (ref 31.5–35.7)
MCV: 80 fL (ref 79–97)
Monocytes Absolute: 0.4 10*3/uL (ref 0.1–0.9)
Monocytes: 8 %
Neutrophils Absolute: 2 10*3/uL (ref 1.4–7.0)
Neutrophils: 42 %
RBC: 4.57 x10E6/uL (ref 3.77–5.28)
RDW: 15.3 % (ref 12.3–15.4)
WBC: 4.8 10*3/uL (ref 3.4–10.8)

## 2017-11-05 LAB — HEMOGLOBIN A1C
Est. average glucose Bld gHb Est-mCnc: 111 mg/dL
Hgb A1c MFr Bld: 5.5 % (ref 4.8–5.6)

## 2017-11-05 LAB — COMPREHENSIVE METABOLIC PANEL
ALT: 13 IU/L (ref 0–32)
AST: 17 IU/L (ref 0–40)
Albumin/Globulin Ratio: 1.3 (ref 1.2–2.2)
Albumin: 3.8 g/dL (ref 3.5–5.5)
Alkaline Phosphatase: 76 IU/L (ref 39–117)
BUN/Creatinine Ratio: 9 (ref 9–23)
BUN: 10 mg/dL (ref 6–24)
Bilirubin Total: 0.2 mg/dL (ref 0.0–1.2)
CO2: 22 mmol/L (ref 20–29)
Calcium: 9 mg/dL (ref 8.7–10.2)
Chloride: 107 mmol/L — ABNORMAL HIGH (ref 96–106)
Creatinine, Ser: 1.13 mg/dL — ABNORMAL HIGH (ref 0.57–1.00)
GFR calc Af Amer: 62 mL/min/{1.73_m2} (ref >59)
GFR calc non Af Amer: 54 mL/min/{1.73_m2} — ABNORMAL LOW (ref >59)
Globulin, Total: 2.9 g/dL (ref 1.5–4.5)
Glucose: 81 mg/dL (ref 65–99)
Potassium: 3.9 mmol/L (ref 3.5–5.2)
Sodium: 143 mmol/L (ref 134–144)
Total Protein: 6.7 g/dL (ref 6.0–8.5)

## 2017-11-05 LAB — LIPID PANEL WITH LDL/HDL RATIO
Cholesterol, Total: 154 mg/dL (ref 100–199)
HDL: 65 mg/dL (ref >39)
LDL Calculated: 78 mg/dL (ref 0–99)
LDl/HDL Ratio: 1.2 ratio (ref 0.0–3.2)
Triglycerides: 57 mg/dL (ref 0–149)
VLDL Cholesterol Cal: 11 mg/dL (ref 5–40)

## 2017-11-05 LAB — T4, FREE: Free T4: 1.33 ng/dL (ref 0.82–1.77)

## 2017-11-05 LAB — FOLATE: Folate: >20 ng/mL (ref >3.0)

## 2017-11-05 LAB — INSULIN, RANDOM: INSULIN: 15.2 u[IU]/mL (ref 2.6–24.9)

## 2017-11-05 LAB — VITAMIN D 25 HYDROXY (VIT D DEFICIENCY, FRACTURES): Vit D, 25-Hydroxy: 51.7 ng/mL (ref 30.0–100.0)

## 2017-11-05 LAB — TSH: TSH: 3.24 u[IU]/mL (ref 0.450–4.500)

## 2017-11-09 NOTE — Progress Notes (Signed)
Office: [phone]  /  Fax: [phone]  Date: November 23, 2017 Time Seen: 2:05pm Duration: 75 minutes Provider: Glennie Isle, PsyD Type of Session: Intake for Individual Therapy   Informed Consent:The provider's role was explained to [place]. The provider discussed issues of confidentiality, privacy, and limits therein; anticipated course of treatment; potential risks involved with psychotherapy; the voluntary nature of treatment; and the clinic's cancellation policy. The provider also discussed billing, as it relates to insurance and the patient's responsibility. In addition to written consent, verbal informed consent for psychological services was obtained from [place] prior to the initial intake interview.   Yanice was informed that information about mental health appointments will be entered in the medical record at [facility]) via Epic. Moreover, Kella agreed information may be shared with other [facility] providers as needed for coordination of care. Written consent was also provided for this provider to coordinate care with other providers at [facility].The provider further explained leaving voicemail messages and sending messages via MyChart can be utilized for non-emergency reasons, and limits of confidentiality related to communication via technology was discussed. Furthermore, Deborrah was informed the clinic is not a 24/7 crisis center and mental health emergency resources were shared. Junia was given a handout with emergency resources. Annaliesa verbally acknowledged understanding, and agreed to use mental health emergency resources discussed if needed.   Chief Complaint: Shanise was referred by Dr. Dennard Nip. Per the note for the initial visit with Dr. Leafy Ro on November 04, 2017, Marijayne is a picky eater and doesn't like to eat healthier foods; has significant food cravings issues; snacks frequently in the evenings;  skips meals frequently; frequently drinks liquids with calories; frequently makes poor food choices; and struggles with emotional eating. Arlet's Food and Mood (modified PHQ-9) score was a six at the initial appointment.   Marbeth shared lately she has been "craving sweets and salts." She indicated she was informed by Dr. Leafy Ro that she was "secreting too much insulin." As such, she was reportedly recommended to eat more vegetables. Charlsey indicated she fluctuates in weight as she gains and loses 20 to 30 pounds. Since starting with the clinic, Latondra indicated she initially followed the meal plan 40%; however, since then she has not been really following the plan as she had eye surgery. She explained the eye surgery "came out of the blue."   Cicily was asked to complete a questionnaire assessing various behaviors related to emotional eating. Sharmeka endorsed the following: experience food cravings on a regular basis; eat certain foods when you are anxious, stressed, depressed, or your feelings are hurt; use food to help you cope with emotional situations; find food is comforting to you; not worry about what you eat when you are in a good mood; and overeat when you are alone, but eat much less when you are with other people.  HPI: Per the note for the initial visit with Dr. Leafy Ro on November 04, 2017, Chasitie has been heavy most of her life and she started gaining weight after father's death, boyfriend moved away, and mom passed away. Her heaviest weight ever was 459 pounds. Tiwanda shared she began craving sweets and salts after she had the gastric band put on and she had a hernia. Prior to the hernia, she explained she did not have any problems with cravings and portions. Moreover, she denied ever being diagnosed with an eating disorder and denied a history of binging and purging.  Mental Status Examination: Yaris arrived on time for the appointment; however, the appointment was initiated five minutes late  due to a delay in the check-in process. She presented as appropriately dressed and groomed. Analisa appeared her stated age and demonstrated adequate orientation to time, place, person, and purpose of the appointment. She also demonstrated appropriate eye contact. No psychomotor abnormalities or behavioral peculiarities noted. Her mood was euthymic with congruent affect. Her thought processes were logical, linear, and goal-directed. No hallucinations, delusions, bizarre thinking or behavior reported or observed. Judgment, insight, and impulse control appeared to be grossly intact. There was no evidence of paraphasias (i.e., errors in speech, gross mispronunciations, and word substitutions), repetition deficits, or disturbances in volume or prosody (i.e., rhythm and intonation). There was no evidence of attention or memory impairments. Kamoni denied current suicidal and homicidal ideation, plan, and intent.  The Montreal Cognitive Assessment (MoCA) was administered. The MoCA assesses different cognitive domains: attention and concentration, executive functions, memory, language, visuoconstructional skills, conceptual thinking, calculations, and orientation. Audrea received 30 out of 30 points possible on the MoCA.  Family & Psychosocial History: Breean reported she is currently single and has never been married. She indicated she has no children. 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 She described her job as "too much." Valborg noted her highest level of education is an ED.S. degree and she noted she is 30 hours away from her doctorate. She shared her current social support system consists of her family, girlfriends, and God.   Medical History:  Past Medical History:  Diagnosis Date  . Anemia   . Anxiety   . Arthritis    knee   . Asthma   . Back pain   . Depression   . Diabetes  ([place])   . DVT (deep venous thrombosis) ([place])   . Dysfunctional uterine bleeding   . Dyspnea   . GERD (gastroesophageal reflux disease)   . Glaucoma   . Headache(784.0)    MIGRAINES IN PAST  . Hypertension   . Hypothyroidism   . Joint pain   . Lactose intolerance   . Leg edema   . Morbid obesity ([place])   . Multiple food allergies   . Optic neuropathy   . Osteoarthritis   . PE (pulmonary embolism)   . Sleep apnea, obstructive    USES C-PAP   Past Surgical History:  Procedure Laterality Date  . ABDOMINAL HYSTERECTOMY    . CAPSULOTOMY  05/03/2012   Procedure: MINOR CAPSULOTOMY;  Surgeon: Myrtha Mantis., MD;  Location: [facility];  Service: Ophthalmology;  Laterality: Right;  . CAPSULOTOMY  05/05/2012   Procedure: MINOR CAPSULOTOMY;  Surgeon: Myrtha Mantis., MD;  Location: [facility];  Service: Ophthalmology;  Laterality: Right;  . CATARACT SURG    . DILATION AND CURETTAGE OF UTERUS    . ENDOMETRIAL ABLATION     failure   . EYE SURGERY  11/16/2009   FOR GLAUCOMA AND LASER SURGERY  X2  . GASTRIC BYPASS  [id number]  . GLAUCOMA SURGERY  10/31/2009  . HERNIA REPAIR    . HIATAL HERNIA REPAIR  01/30/2014   Procedure: HERNIA REPAIR HIATAL;  Surgeon: Pedro Earls, MD;  Location: [facility];  Service: General;;  . HYSTEROSCOPY    . IVC FILTER     2010 - THEN REMOVED THEN PLACED AGAIN 2012  . JOINT REPLACEMENT    .  KNEE ARTHROSCOPY  12/2007  . LAPAROSCOPIC GASTRIC BANDING N/A 01/30/2014   Procedure: LAPAROSCOPIC GASTRIC BANDING over bypass ;  Surgeon: Pedro Earls, MD;  Location: [facility];  Service: General;  Laterality: N/A;  . POSTERIOR LAMINECTOMY / DECOMPRESSION CERVICAL SPINE  01/26/2011  . TOTAL KNEE ARTHROPLASTY  09/09/2011   Procedure: TOTAL KNEE ARTHROPLASTY;  Surgeon: Johnn Hai, MD;  Location: [facility];  Service: Orthopedics;  Laterality: Left;  . TOTAL KNEE ARTHROPLASTY Right 03/22/2014   Procedure: RIGHT TOTAL KNEE ARTHROPLASTY;  Surgeon: Johnn Hai, MD;   Location[facility];  Service: Orthopedics;  Laterality: Right;  . UPPER GI ENDOSCOPY N/A 01/30/2014   Procedure: UPPER GI ENDOSCOPY;  Surgeon: Pedro Earls, MD;  Location: [facility];  Service: General;  Laterality: N/A;  . YAG LASER APPLICATION  0/94/0768   Procedure: YAG LASER APPLICATION;  Surgeon: Myrtha Mantis., MD;  Location: [facility];  Service: Ophthalmology;  Laterality: Right;  . YAG LASER APPLICATION  0/88/1103   Procedure: YAG LASER APPLICATION;  Surgeon: Myrtha Mantis., MD;  Location: [facility];  Service: Ophthalmology;  Laterality: Right;   Current Outpatient Medications on File Prior to Visit  Medication Sig Dispense Refill  . acetaminophen (TYLENOL) 500 MG tablet Take 1,000 mg by mouth every 4 (four) hours as needed for moderate pain.    . Ascorbic Acid (VITAMIN C) 1000 MG tablet Take 1,000 mg by mouth every morning.     Marland Kitchen b complex vitamins tablet Take 1 tablet by mouth every morning.     . brimonidine (ALPHAGAN) 0.15 % ophthalmic solution Place 1 drop into both eyes 2 (two) times daily.     . brinzolamide (AZOPT) 1 % ophthalmic suspension Place 1 drop into both eyes 2 (two) times daily.     . budesonide-formoterol (SYMBICORT) 160-4.5 MCG/ACT inhaler Inhale 2 puffs into the lungs 2 (two) times daily.    . Calcium Carbonate-Vit D-Min (CALTRATE 600+D PLUS MINERALS PO) Take 1 tablet by mouth 3 (three) times daily.     . Cyanocobalamin (VITAMIN B-12) 2500 MCG SUBL Place 2 tablets under the tongue at bedtime.    . cyclobenzaprine (FLEXERIL) 10 MG tablet Take 10 mg by mouth at bedtime.     Marland Kitchen diltiazem (DILACOR XR) 240 MG 24 hr capsule Take 240 mg by mouth at bedtime.    . Docosahexaenoic Acid (DHA PO) Take 1 tablet by mouth 2 (two) times daily.    Marland Kitchen esomeprazole (NEXIUM) 40 MG capsule Take 40 mg by mouth daily before breakfast.    . fexofenadine (ALLEGRA) 180 MG tablet Take 180 mg by mouth daily.    . fish oil-omega-3 fatty acids 1000 MG capsule Take 1 g by mouth 2 (two)  times daily.     . Fluticasone Furoate-Vilanterol (BREO ELLIPTA IN) Inhale into the lungs.    . furosemide (LASIX) 40 MG tablet Take 40 mg by mouth.    . iron polysaccharides (NIFEREX) 150 MG capsule Take 150 mg by mouth 2 (two) times daily.    Marland Kitchen levalbuterol (XOPENEX) 1.25 MG/3ML nebulizer solution Take 1.25 mg by nebulization every 6 (six) hours as needed for wheezing or shortness of breath.     . magnesium chloride (SLOW-MAG) 64 MG TBEC Take 1-2 tablets by mouth 2 (two) times daily. Patient takes 2 tablet every morning and 1 tablet with dinner    . mometasone (NASONEX) 50 MCG/ACT nasal spray Place 2 sprays into the nose daily as needed (allergies).     Marland Kitchen  montelukast (SINGULAIR) 10 MG tablet Take 10 mg by mouth at bedtime.    . Multiple Vitamin (MULTIVITAMIN) tablet Take 1 tablet by mouth 2 (two) times daily.     . niacin 500 MG tablet Take 500 mg by mouth daily with breakfast. Needs to be flush free per patient    . PAZEO 0.7 % SOLN     . potassium chloride SA (K-DUR,KLOR-CON) 20 MEQ tablet Take 20-40 mEq by mouth 2 (two) times daily. 40 meq with breakfast and 20 meq with dinner    . prednisoLONE acetate (PRED FORTE) 1 % ophthalmic suspension Place 1 drop into the left eye 4 (four) times daily.    Marland Kitchen Propylene Glycol (SYSTANE BALANCE) 0.6 % SOLN Apply to eye.    . pyridOXINE (VITAMIN B-6) 100 MG tablet Take 100 mg by mouth every morning.     . quiNINE (QUALAQUIN) 324 MG capsule Take 324 mg by mouth at bedtime.     . Rivaroxaban (XARELTO) 20 MG TABS Take 20 mg by mouth at bedtime.     . sodium chloride (OCEAN) 0.65 % SOLN nasal spray Place 2 sprays into both nostrils 2 (two) times daily.    . valsartan-hydrochlorothiazide (DIOVAN-HCT) 80-12.5 MG per tablet Take 1 tablet by mouth every morning.     . Vitamin D, Ergocalciferol, (DRISDOL) 50000 UNITS CAPS capsule Take 50,000 Units by mouth every 7 (seven) days. Patient takes on Saturday     No current facility-administered medications on file  prior to visit.    Per Elder Love, her medical history is significant for hypertension, sleep apnea, morbid obesity, asthma, allergies,  pulmonary embolisms, depression, symptoms of glaucoma, and arthritis. She shared she has had two knee replacements. Mikaiya shared she was diabetic; however, she no longer had it after the gastric sleeve. Nonetheless, she reported Dr. Leafy Ro informed her she is in remission when it comes to diabetes. Regarding medications, Kerin shared she takes Diovan, steroid drops for her eye, Alphagan, Cardia, Qualaquin, Singular, Flexaril, a blood thinner, Xanax, inhaler, multivitamin, B Complex, B 12, prescription potassium, Calcium, prescription Vitamin D, and a nasal spray. As a child, Olivette reported she had a head injury while playing in the house. She "busted" her head on the wall. She denied a history of loss of consciousness.   Mental Health History: Janeisha shared she has never received therapeutic services, aside from group therapy and a psychological evaluation for bariatric surgery. Following the surgery, she explained she met with a therapist for individual therapy for approximately six months. Bianna denied a history of hospitalization for mental health concerns and has never seen a psychiatrist. She explained her primary care physician prescribes Xanax currently. Yolinda denied a family history of mental health concerns. She denied a trauma history, including sexual, physical, psychological abuse, as well as neglect. 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 She denied experiencing the following: obsessions and compulsions; mania; attention and concentration issues; substance use; hallucinations and delusions;  engagement in self-harm; and history of and current suicidal and homicidal ideation, plan, and intent.   Structured Assessment Results: The Patient Health Questionnaire-9 (PHQ-9) is a self-report measure that assesses symptoms and severity of depression over the course of the last two weeks. Stela obtained a score of five suggesting mild depression. Oluwatoyin finds the endorsed  symptoms to be somewhat difficult.  Depression screen PHQ 2/9 11/23/2017  Decreased Interest 1  Down, Depressed, Hopeless 0  PHQ - 2 Score 1  Altered sleeping 1  Tired, decreased energy 1  Change in appetite 2  Feeling bad or failure about yourself  0  Trouble concentrating 0  Moving slowly or fidgety/restless 0  Suicidal thoughts 0  PHQ-9 Score 5  Difficult doing work/chores -   The Generalized Anxiety Disorder-7 (GAD-7) is a brief self-report measure that assesses symptoms of anxiety over the course of the last two weeks. Aneri obtained a score of three suggesting minimal anxiety.  GAD 7 : Generalized Anxiety Score 11/23/2017  Nervous, Anxious, on Edge 1  Control/stop worrying 0  Worry too much - different things 1  Trouble relaxing 0  Restless 0  Easily annoyed or irritable 1  Afraid - awful might happen 0  Total GAD 7 Score 3  Anxiety Difficulty Not difficult at all    Interventions: A chart review was conducted prior to the clinical intake interview. The MoCA, PHQ-9, and GAD-7 were administered and a clinical intake interview was completed. In addition, Riot was asked to complete a Mood and Food questionnaire to assess various behaviors related to emotional eating. Throughout session, empathic reflections and validation was provided. Continuing treatment with this provider was discussed and a treatment goal was established. Kashish was provided a handout highlighting physical versus emotional hunger. She was encouraged to utilize the handout between now and the next appointment to increase awareness of hunger  patterns and subsequent eating. Elder Love agreed.   Provisional DSM-5 Diagnosis: 311 (F32.8) Other Specified Depressive Disorder, Emotional Eating  Plan: Rahi expressed understanding and agreement with the initial treatment plan of care. She appears able and willing to participate as evidenced by collaboration on a treatment goal, engagement in reciprocal conversation, and asking questions as needed for clarification. Initially, Caroline indicated while she wishes to meet with this provider again, she was unsure about scheduling. She explained, "I am feeling a little overwhelmed right now." She discussed upcoming events related to her work, and other ongoing work stressors and medical appointments. Given the ongoing stressors, the provider recommended scheduling an appointment in two weeks versus waiting. Kaiya was receptive and was observed checking her schedule. She agreed to meet in four weeks versus two weeks as her schedule did not allow for an appointment in two weeks. The following treatment goal was established: reduce emotional eating.

## 2017-11-18 ENCOUNTER — Ambulatory Visit (INDEPENDENT_AMBULATORY_CARE_PROVIDER_SITE_OTHER): Payer: BC Managed Care – PPO | Admitting: Family Medicine

## 2017-11-18 VITALS — BP 108/71 | HR 86 | Temp 98.1°F | Ht 64.0 in | Wt 272.0 lb

## 2017-11-18 DIAGNOSIS — Z6841 Body Mass Index (BMI) 40.0 and over, adult: Secondary | ICD-10-CM | POA: Diagnosis not present

## 2017-11-18 DIAGNOSIS — E119 Type 2 diabetes mellitus without complications: Secondary | ICD-10-CM | POA: Diagnosis not present

## 2017-11-18 NOTE — Progress Notes (Signed)
Office: [phone]  /  Fax: [phone]   HPI:   Chief Complaint: OBESITY [name] is here to discuss her progress with her obesity treatment plan. She is on the Category 2 plan and is following her eating plan approximately 40 % of the time. She states she is exercising 0 minutes 0 times per week. [name] struggled to follow her plan primarily due to complications from her eye surgery and inability to meal plan. This should improve as her eye improves.  Her weight is 272 lb (123.4 kg) today and has not lost weight since her last visit. She has lost 0 lbs since starting treatment with [name].  Diabetes II [name] has a diagnosis of diabetes type II. [name]'s diabetes mellitus is in remission since weight loss surgery and A1c is still well controlled on medications. Her fasting insulin was elevated suggesting she is still struggling with insulin resistance. She has been working on intensive lifestyle modifications including diet, exercise, and weight loss to help control her blood glucose levels.  ALLERGIES: Allergies  Allergen Reactions  . Beta Adrenergic Blockers Other (See Comments)    RED EYES AND CONGESTION  . Hydrocodone   . Penicillins Hives, Itching and Swelling  . Sulfa Antibiotics Hives    RED EYES  . Thimerosal Hives and Itching    MEDICATIONS: Current Outpatient Medications on File Prior to Visit  Medication Sig Dispense Refill  . acetaminophen (TYLENOL) 500 MG tablet Take 1,000 mg by mouth every 4 (four) hours as needed for moderate pain.    . Ascorbic Acid (VITAMIN C) 1000 MG tablet Take 1,000 mg by mouth every morning.     Marland Kitchen b complex vitamins tablet Take 1 tablet by mouth every morning.     . brimonidine (ALPHAGAN) 0.15 % ophthalmic solution Place 1 drop into both eyes 2 (two) times daily.     . brinzolamide (AZOPT) 1 % ophthalmic suspension Place 1 drop into both eyes 2 (two) times daily.     . budesonide-formoterol (SYMBICORT) 160-4.5 MCG/ACT inhaler Inhale 2 puffs  into the lungs 2 (two) times daily.    . Calcium Carbonate-Vit D-Min (CALTRATE 600+D PLUS MINERALS PO) Take 1 tablet by mouth 3 (three) times daily.     . Cyanocobalamin (VITAMIN B-12) 2500 MCG SUBL Place 2 tablets under the tongue at bedtime.    . cyclobenzaprine (FLEXERIL) 10 MG tablet Take 10 mg by mouth at bedtime.     Marland Kitchen diltiazem (DILACOR XR) 240 MG 24 hr capsule Take 240 mg by mouth at bedtime.    . Docosahexaenoic Acid (DHA PO) Take 1 tablet by mouth 2 (two) times daily.    Marland Kitchen esomeprazole (NEXIUM) 40 MG capsule Take 40 mg by mouth daily before breakfast.    . fexofenadine (ALLEGRA) 180 MG tablet Take 180 mg by mouth daily.    . fish oil-omega-3 fatty acids 1000 MG capsule Take 1 g by mouth 2 (two) times daily.     . Fluticasone Furoate-Vilanterol (BREO ELLIPTA IN) Inhale into the lungs.    . furosemide (LASIX) 40 MG tablet Take 40 mg by mouth.    . iron polysaccharides (NIFEREX) 150 MG capsule Take 150 mg by mouth 2 (two) times daily.    Marland Kitchen levalbuterol (XOPENEX) 1.25 MG/3ML nebulizer solution Take 1.25 mg by nebulization every 6 (six) hours as needed for wheezing or shortness of breath.     . magnesium chloride (SLOW-MAG) 64 MG TBEC Take 1-2 tablets by mouth 2 (two) times daily. Patient takes 2  tablet every morning and 1 tablet with dinner    . mometasone (NASONEX) 50 MCG/ACT nasal spray Place 2 sprays into the nose daily as needed (allergies).     . montelukast (SINGULAIR) 10 MG tablet Take 10 mg by mouth at bedtime.    . Multiple Vitamin (MULTIVITAMIN) tablet Take 1 tablet by mouth 2 (two) times daily.     . niacin 500 MG tablet Take 500 mg by mouth daily with breakfast. Needs to be flush free per patient    . PAZEO 0.7 % SOLN     . potassium chloride SA (K-DUR,KLOR-CON) 20 MEQ tablet Take 20-40 mEq by mouth 2 (two) times daily. 40 meq with breakfast and 20 meq with dinner    . prednisoLONE acetate (PRED FORTE) 1 % ophthalmic suspension Place 1 drop into the left eye 4 (four) times  daily.    Marland Kitchen Propylene Glycol (SYSTANE BALANCE) 0.6 % SOLN Apply to eye.    . pyridOXINE (VITAMIN B-6) 100 MG tablet Take 100 mg by mouth every morning.     . quiNINE (QUALAQUIN) 324 MG capsule Take 324 mg by mouth at bedtime.     . Rivaroxaban (XARELTO) 20 MG TABS Take 20 mg by mouth at bedtime.     . sodium chloride (OCEAN) 0.65 % SOLN nasal spray Place 2 sprays into both nostrils 2 (two) times daily.    . valsartan-hydrochlorothiazide (DIOVAN-HCT) 80-12.5 MG per tablet Take 1 tablet by mouth every morning.     . Vitamin D, Ergocalciferol, (DRISDOL) 50000 UNITS CAPS capsule Take 50,000 Units by mouth every 7 (seven) days. Patient takes on Saturday     No current facility-administered medications on file prior to visit.     PAST MEDICAL HISTORY: Past Medical History:  Diagnosis Date  . Anemia   . Anxiety   . Arthritis    knee   . Asthma   . Back pain   . Depression   . Diabetes ([place])   . DVT (deep venous thrombosis) ([place])   . Dysfunctional uterine bleeding   . Dyspnea   . GERD (gastroesophageal reflux disease)   . Glaucoma   . Headache(784.0)    MIGRAINES IN PAST  . Hypertension   . Hypothyroidism   . Joint pain   . Lactose intolerance   . Leg edema   . Morbid obesity ([place])   . Multiple food allergies   . Optic neuropathy   . Osteoarthritis   . PE (pulmonary embolism)   . Sleep apnea, obstructive    USES C-PAP    PAST SURGICAL HISTORY: Past Surgical History:  Procedure Laterality Date  . ABDOMINAL HYSTERECTOMY    . CAPSULOTOMY  05/03/2012   Procedure: MINOR CAPSULOTOMY;  Surgeon: Myrtha Mantis., MD;  Location: [facility];  Service: Ophthalmology;  Laterality: Right;  . CAPSULOTOMY  05/05/2012   Procedure: MINOR CAPSULOTOMY;  Surgeon: Myrtha Mantis., MD;  Location: [facility];  Service: Ophthalmology;  Laterality: Right;  . CATARACT SURG    . DILATION AND CURETTAGE OF UTERUS    . ENDOMETRIAL ABLATION     failure   . EYE SURGERY  11/16/2009   FOR GLAUCOMA  AND LASER SURGERY  X2  . GASTRIC BYPASS  [id number]  . GLAUCOMA SURGERY  10/31/2009  . HERNIA REPAIR    . HIATAL HERNIA REPAIR  01/30/2014   Procedure: HERNIA REPAIR HIATAL;  Surgeon: Pedro Earls, MD;  Location: [facility];  Service: General;;  . HYSTEROSCOPY    .  IVC FILTER     2010 - THEN REMOVED THEN PLACED AGAIN 2012  . JOINT REPLACEMENT    . KNEE ARTHROSCOPY  12/2007  . LAPAROSCOPIC GASTRIC BANDING N/A 01/30/2014   Procedure: LAPAROSCOPIC GASTRIC BANDING over bypass ;  Surgeon: Pedro Earls, MD;  Location: [facility];  Service: General;  Laterality: N/A;  . POSTERIOR LAMINECTOMY / DECOMPRESSION CERVICAL SPINE  01/26/2011  . TOTAL KNEE ARTHROPLASTY  09/09/2011   Procedure: TOTAL KNEE ARTHROPLASTY;  Surgeon: Johnn Hai, MD;  Location: [facility];  Service: Orthopedics;  Laterality: Left;  . TOTAL KNEE ARTHROPLASTY Right 03/22/2014   Procedure: RIGHT TOTAL KNEE ARTHROPLASTY;  Surgeon: Johnn Hai, MD;  Location: [facility];  Service: Orthopedics;  Laterality: Right;  . UPPER GI ENDOSCOPY N/A 01/30/2014   Procedure: UPPER GI ENDOSCOPY;  Surgeon: Pedro Earls, MD;  Location: [facility];  Service: General;  Laterality: N/A;  . YAG LASER APPLICATION  07/03/4006   Procedure: YAG LASER APPLICATION;  Surgeon: Myrtha Mantis., MD;  Location: [facility];  Service: Ophthalmology;  Laterality: Right;  . YAG LASER APPLICATION  6/76/1952   Procedure: YAG LASER APPLICATION;  Surgeon: Myrtha Mantis., MD;  Location: [facility];  Service: Ophthalmology;  Laterality: Right;    SOCIAL HISTORY: Social History   Tobacco Use  . Smoking status: Never Smoker  . Smokeless tobacco: Never Used  Substance Use Topics  . Alcohol use: No  . Drug use: No    FAMILY HISTORY: Family History  Problem Relation Age of Onset  . Arthritis Mother   . Hypertension Mother   . Diabetes Mother   . Stroke Mother   . Obesity Mother   . Arthritis Father   . Diabetes Father   . Alcohol abuse Father   .  Hypertension Father   . Sudden death Father   . Glaucoma Unknown   . Cerebral aneurysm Unknown   . Stroke Unknown     ROS: Review of Systems  Constitutional: Negative for weight loss.  Endo/Heme/Allergies:       Negative hypoglycemia    PHYSICAL EXAM: Blood pressure 108/71, pulse 86, temperature 98.1 F (36.7 C), temperature source Oral, height 5\' 4"  (1.626 m), weight 272 lb (123.4 kg), last menstrual period 07/26/2011, SpO2 99 %. Body mass index is 46.69 kg/m. Physical Exam  Constitutional: She is oriented to person, place, and time. She appears well-developed and well-nourished.  Cardiovascular: Normal rate.  Pulmonary/Chest: Effort normal.  Musculoskeletal: Normal range of motion.  Neurological: She is oriented to person, place, and time.  Skin: Skin is warm and dry.  Psychiatric: She has a normal mood and affect. Her behavior is normal.  Vitals reviewed.   RECENT LABS AND TESTS: BMET    Component Value Date/Time   NA 143 11/04/2017 0943   K 3.9 11/04/2017 0943   CL 107 (H) 11/04/2017 0943   CO2 22 11/04/2017 0943   GLUCOSE 81 11/04/2017 0943   GLUCOSE 159 (H) 03/23/2014 0435   BUN 10 11/04/2017 0943   CREATININE 1.13 (H) 11/04/2017 0943   CALCIUM 9.0 11/04/2017 0943   GFRNONAA 54 (L) 11/04/2017 0943   GFRAA 62 11/04/2017 0943   Lab Results  Component Value Date   HGBA1C 5.5 11/04/2017   HGBA1C 5.7 (H) 01/25/2013   HGBA1C (H) 08/09/2007    10.9 (NOTE)   The ADA recommends the following therapeutic goals for glycemic   control related to Hgb A1C measurement:   Goal of Therapy:   <  7.0% Hgb A1C   Action Suggested:  > 8.0% Hgb A1C   Ref:  Diabetes Care, 22, Suppl. 1, 1999   Lab Results  Component Value Date   INSULIN 15.2 11/04/2017   CBC    Component Value Date/Time   WBC 4.8 11/04/2017 0943   WBC 6.3 03/25/2014 0446   RBC 4.57 11/04/2017 0943   RBC 3.65 (L) 03/25/2014 0446   HGB 12.3 11/04/2017 0943   HGB 13.1 12/20/2007 1044   HCT 36.5 11/04/2017  0943   HCT 39.1 12/20/2007 1044   PLT 128 (L) 03/25/2014 0446   PLT 154 12/20/2007 1044   MCV 80 11/04/2017 0943   MCV 84.3 12/20/2007 1044   MCH 26.9 11/04/2017 0943   MCH 26.8 03/25/2014 0446   MCHC 33.7 11/04/2017 0943   MCHC 34.1 03/25/2014 0446   RDW 15.3 11/04/2017 0943   RDW 15.6 (H) 12/20/2007 1044   LYMPHSABS 2.1 11/04/2017 0943   LYMPHSABS 1.9 12/20/2007 1044   MONOABS 0.6 02/01/2014 0445   MONOABS 0.5 12/20/2007 1044   EOSABS 0.4 11/04/2017 0943   BASOSABS 0.0 11/04/2017 0943   BASOSABS 0.1 12/20/2007 1044   Iron/TIBC/Ferritin/ %Sat    Component Value Date/Time   IRON 55 01/18/2011 0946   TIBC 291 01/18/2011 0946   FERRITIN 25 01/18/2011 0946   IRONPCTSAT 19 (L) 01/18/2011 0946   Lipid Panel     Component Value Date/Time   CHOL 154 11/04/2017 0943   TRIG 57 11/04/2017 0943   HDL 65 11/04/2017 0943   CHOLHDL 5.9 08/09/2007 0039   VLDL 32 08/09/2007 0039   LDLCALC 78 11/04/2017 0943   Hepatic Function Panel     Component Value Date/Time   PROT 6.7 11/04/2017 0943   ALBUMIN 3.8 11/04/2017 0943   AST 17 11/04/2017 0943   ALT 13 11/04/2017 0943   ALKPHOS 76 11/04/2017 0943   BILITOT 0.2 11/04/2017 0943      Component Value Date/Time   TSH 3.240 11/04/2017 0943   TSH 3.134 01/25/2013 0826   TSH 2.962 01/18/2011 0946    ASSESSMENT AND PLAN: Type 2 diabetes mellitus without complication, without long-term current use of insulin (HCC)  Class 3 severe obesity with serious comorbidity and body mass index (BMI) of 45.0 to 49.9 in adult, unspecified obesity type ([place])  PLAN:  Diabetes II Ellory has been given extensive diabetes education by myself today including ideal fasting and post-prandial blood glucose readings, individual ideal Hgb A1c goals and hypoglycemia prevention. We discussed the importance of good blood sugar control to decrease the likelihood of diabetic complications such as nephropathy, neuropathy, limb loss, blindness, coronary artery  disease, and death. We discussed the importance of intensive lifestyle modification including diet, exercise and weight loss as the first line treatment for diabetes. Angeline agrees to continue her diabetes medications, diet, and exercise, no need to check BGs at home. Marbeth agrees to follow up with our clinic in 2 weeks.  We spent > than 50% of the 30 minute visit on the counseling as documented in the note.  Obesity Kaziyah is currently in the action stage of change. As such, her goal is to continue with weight loss efforts She has agreed to follow the Category 2 plan Krystiana has been instructed to work up to a goal of 150 minutes of combined cardio and strengthening exercise per week for weight loss and overall health benefits. We discussed the following Behavioral Modification Strategies today: increasing lean protein intake, decreasing simple carbohydrates  and  work on [employer] and easy cooking plans   Rilda has agreed to follow up with our clinic in 2 weeks. She was informed of the importance of frequent follow up visits to maximize her success with intensive lifestyle modifications for her multiple health conditions.   OBESITY BEHAVIORAL INTERVENTION VISIT  Today's visit was # 2 out of 22.  Starting weight: 272 lbs Starting date: 11/04/17 Today's weight : 272 lbs Today's date: 11/18/2017 Total lbs lost to date: 0    ASK: We discussed the diagnosis of obesity with Magnus Ivan Jakubiak today and Jasline agreed to give [name] permission to discuss obesity behavioral modification therapy today.  ASSESS: Thuy has the diagnosis of obesity and her BMI today is 6.67 Anelle is in the action stage of change   ADVISE: Rosaura was educated on the multiple health risks of obesity as well as the benefit of weight loss to improve her health. She was advised of the need for long term treatment and the importance of lifestyle modifications.  AGREE: Multiple dietary modification options and  treatment options were discussed and  Ailana agreed to the above obesity treatment plan.  I, Trixie Dredge, am acting as transcriptionist for Dennard Nip, MD  I have reviewed the above documentation for accuracy and completeness, and I agree with the above. -Dennard Nip, MD

## 2017-11-23 ENCOUNTER — Ambulatory Visit (INDEPENDENT_AMBULATORY_CARE_PROVIDER_SITE_OTHER): Payer: BC Managed Care – PPO | Admitting: Psychology

## 2017-11-23 DIAGNOSIS — F3289 Other specified depressive episodes: Secondary | ICD-10-CM

## 2017-12-09 ENCOUNTER — Ambulatory Visit (INDEPENDENT_AMBULATORY_CARE_PROVIDER_SITE_OTHER): Payer: BC Managed Care – PPO | Admitting: Family Medicine

## 2017-12-15 NOTE — Progress Notes (Signed)
Office: [phone]  /  Fax: [phone]   Date: December 21, 2017 Time Seen: 9:55am Duration: 30 minutes Provider: Glennie Isle, Psy.D. Type of Session: Individual Therapy   HPI: [name] was referred by Dr. Dennard Nip. Per the note for the initial visit with Dr. Leafy Ro on November 04, 2017, [name] a picky eater and doesn't like to eat healthier foods;has significant food cravings issues; snacks frequently in the evenings; skips meals frequently; frequently drinks liquids with calories; frequently makes poor food choices; and struggles with emotional eating. [name]'s Food and Mood (modified PHQ-9) score wasa six at the initial appointment. During the initial appointment with this provider, [name] shared lately she has been "craving sweets and salts." She indicated she was informed by Dr. Leafy Ro that she was "secreting too much insulin." As such, she was reportedly recommended to eat more vegetables. [name] indicated she fluctuates in weight as she gains and loses 20 to 30 pounds. Since starting with the clinic, [name] indicated she initially followed the meal plan 40%; however, since then she has not been really following the plan as she had eye surgery. She explained the eye surgery "came out of the blue." In addition, [name] was asked to complete a questionnaire assessing various behaviors related to emotional eating. [name] endorsed the following: experience food cravings on a regular basis; eat certain foods when you are anxious, stressed, depressed, or your feelings are hurt; use food to help you cope with emotional situations; find food is comforting to you; not worry about what you eat when you are in a good mood; and overeat when you are alone, but eat much less when you are with other people. Moreover, per the note for the initial visit with Dr. Leafy Ro on November 04, 2017, [name] been heavy most of herlife and shestarted gaining weight after father's death, boyfriend moved away, and  mom passed away. Herheaviest weight ever was 459pounds. Furthermore, during the initial appointment with this provider, [name] shared she began craving sweets and salts after she had the gastric band put on and she had a hernia. Prior to the hernia, she explained she did not have any problems with cravings and portions. Moreover, she denied ever being diagnosed with an eating disorder and denied a history of binging and purging. During today's appointment, [name] reported continued engagement in emotional eating specifically after her recent eye surgery.   Session Content: Session focused on the following treatment goal: decrease emotional eating. The session was initiated with the administration of the PHQ-9 and GAD-7, as well as a brief check-in. [name] shared since the last appointment, she had to have "more eye surgery." She is currently on leave from work for two weeks to recuperate. Session then briefly focused on reviewing endorsed symptoms on the PHQ-9 and GAD-7. [name] attributed current symptoms to work stressors and currently not being at work as well as recent vision difficulties. Since the last appointment, [name] reported her eating habits have not been good. She explained she was "doing really good while working" as she engaged in Psychologist, sport and exercise. Nevertheless, she discussed recently making better choices. Session then focused on reviewing physical and emotional hunger. [name] reported, "I thought about what I was eating." She further described increased awareness about her eating habits, and enjoying what she consumes as she eats one thing at a time. Moreover, [name] discussed awareness of emotional hunger; however, due to not being able to drive, [name] explained she did not have any "comfort foods." Thus, she ate what was available to  her in the house. [name] of session focused on psychoeducation regarding triggers for emotional eating was provided. [name] was provided a handout, and  encouraged to utilize the handout between now and the next appointment to increase awareness of triggers and frequency. [name] agreed. [name] was receptive to today's session as evidenced by her openness to sharing and responsiveness to feedback. She also demonstrated openness to exploring and verbalizing thoughts, feelings, behaviors and displayed understanding of the need for continued therapeutic services. In addition, [name] stated, "I appreciated today's appointment."   Mental Status Examination: [name] arrived early for the appointment; therefore, the appointment was initiated early. She presented as appropriately dressed and groomed. [name] appeared her stated age and demonstrated adequate orientation to time, place, person, and purpose of the appointment. She also demonstrated appropriate eye contact. No psychomotor abnormalities or behavioral peculiarities noted. Her mood was euthymic with congruent affect. Her thought processes were logical, linear, and goal-directed. No hallucinations, delusions, bizarre thinking or behavior reported or observed. Judgment, insight, and impulse control appeared to be grossly intact. There was no evidence of paraphasias (i.e., errors in speech, gross mispronunciations, and word substitutions), repetition deficits, or disturbances in volume or prosody (i.e., rhythm and intonation). There was no evidence of attention or memory impairments. [name] denied current suicidal and homicidal ideation, intent or plan.  Structured Assessment Results: The Patient Health Questionnaire-9 (PHQ-9) is a self-report measure that assesses symptoms and severity of depression over the course of the last two weeks. [name] obtained a score of five suggesting mild depression. [name] finds the endorsed symptoms to be extremely difficult. Depression screen PHQ 2/9 12/22/2017  Decreased Interest 1  Down, Depressed, Hopeless 0  PHQ - 2 Score 1  Altered sleeping 1  Tired, decreased energy 1    Change in appetite 1  Feeling bad or failure about yourself  0  Trouble concentrating 1  Moving slowly or fidgety/restless 0  Suicidal thoughts 0  PHQ-9 Score 5  Difficult doing work/chores -   The Generalized Anxiety Disorder-7 (GAD-7) is a brief self-report measure that assesses symptoms of anxiety over the course of the last two weeks. [name] obtained a score of six suggesting mild anxiety. GAD 7 : Generalized Anxiety Score 12/22/2017  Nervous, Anxious, on Edge 2  Control/stop worrying 1  Worry too much - different things 1  Trouble relaxing 1  Restless 0  Easily annoyed or irritable 1  Afraid - awful might happen 0  Total GAD 7 Score 6  Anxiety Difficulty Very difficult   Interventions: [name] was administered the PHQ-9 and GAD-7 for symptom monitoring. Content from the last session was reviewed. Throughout today's session, empathic reflections and validation were provided. Psychoeducation regarding triggers for emotional eating was provided. [name] was given a handout for triggers and anadditional copy of the handout for physical versus emotional hunger was provided.  Treatment Goal & Progress: During the initial appointment with this provider on November 23, 2017 the goal of reducing emotional eating was established. [name] has shown progress in her goal of reducing emotional eating as evidenced by her discussing increased awareness of her eating patterns. Triggers for emotional eating were discussed today and she appeared receptive to monitoring the frequency of these triggers.  DSM-5 Diagnosis: 311 (F32.8) Other Specified Depressive Disorder, Emotional Eating  Plan: [name] continues to appear able and willing to participate as evidenced by engagement in reciprocal conversation, and asking questions for clarification as appropriate. This provider recommended a follow-up appointment be scheduled in two weeks; however, [name]  reported she was unsure of her work schedule for the new  semester. As such, she did not schedule a follow-up appointment. She explained she plans to return back to work next Tuesday and was receptive to this provider's office calling her the following week to follow-up. The next session will focus on reviewing triggers for emotional eating and working towards the established treatment goal.

## 2017-12-22 ENCOUNTER — Ambulatory Visit (INDEPENDENT_AMBULATORY_CARE_PROVIDER_SITE_OTHER): Payer: BC Managed Care – PPO | Admitting: Psychology

## 2017-12-22 ENCOUNTER — Ambulatory Visit (INDEPENDENT_AMBULATORY_CARE_PROVIDER_SITE_OTHER): Payer: BC Managed Care – PPO | Admitting: Bariatrics

## 2017-12-22 ENCOUNTER — Encounter (INDEPENDENT_AMBULATORY_CARE_PROVIDER_SITE_OTHER): Payer: Self-pay

## 2017-12-22 DIAGNOSIS — F3289 Other specified depressive episodes: Secondary | ICD-10-CM | POA: Diagnosis not present

## 2018-02-07 ENCOUNTER — Encounter (INDEPENDENT_AMBULATORY_CARE_PROVIDER_SITE_OTHER): Payer: Self-pay

## 2018-02-07 ENCOUNTER — Ambulatory Visit (INDEPENDENT_AMBULATORY_CARE_PROVIDER_SITE_OTHER): Payer: Self-pay | Admitting: Family Medicine

## 2018-02-07 ENCOUNTER — Ambulatory Visit (INDEPENDENT_AMBULATORY_CARE_PROVIDER_SITE_OTHER): Payer: BC Managed Care – PPO | Admitting: Psychology

## 2018-02-07 NOTE — Progress Notes (Unsigned)
Office: [phone]  /  Fax: [phone]   Date: February 07, 2018 Time Seen:*** Duration:*** Provider: Glennie Isle, Psy.D. Type of Session: Individual Therapy   HPI: Torreywas referred by Dr. Dennard Nip and she was seen for an initial appointment by this provider on November 23, 2017. Per the note for the initial visit with Dr.Beasleyon November 04, 2017,Torreyis a picky eater and doesn't like to eat healthier foods;has significant food cravings issues;snacks frequently in the evenings;skips meals frequently;frequentlydrinksliquids with calories;frequently makes poor food choices; andstruggles with emotional eating.Jamilla's Food and Mood (modified PHQ-9) score wasa six at the initial appointment.During the initial appointment with this provider, Elandra shared lately she has been "craving sweets and salts." She indicated she was informed by Dr. Leafy Ro that she was "secreting too much insulin." As such, she was reportedly recommended to eat more vegetables. Christen indicated she fluctuates in weight as she gains and loses 20 to 30 pounds. Since startingwith the clinic, Shahrzad indicated she initially followed the meal plan 40%; however, since then she has not been really following the plan as she had eye surgery. She explained the eye surgery "came out of the blue."In addition, Torreywas asked to complete a questionnaire assessing various behaviors related to emotional eating. Torreyendorsed the following: experience food cravings on a regular basis; eat certain foods when you are anxious, stressed, depressed, or your feelings are hurt; use food to help you cope with emotional situations; find food is comforting to you; not worry about what you eat when you are in a good mood; and overeat when you are alone, but eat much less when you are with other people. Moreover, per the note for the initial visit with Dr.Beasleyon November 04, 2017,[name] been heavy most of  herlifeandshestarted gaining weight after father's death,boyfriendmoved away, and mom passed away.Herheaviest weight ever was 459pounds.Furthermore, during the initial appointment with this provider, Cathryne shared she began craving sweets and salts after she had the gastric band put on and she had a hernia. Prior to the hernia, she explained she did not have any problems with cravings and portions. Moreover, she denied ever being diagnosed with an eating disorder and denied a history of binging and purging.During today's appointment, Tameka reported ***  Session Content: Session focused on the following treatment goal: decrease emotional eating. The session was initiated with the administration of the PHQ-9 and GAD-7, as well as a brief check-in.  Importance of frequent appointments   Brittaney was receptive to today's session as evidenced by ***.  Mental Status Examination: Gesselle arrived on time for the appointment. She presented as appropriately dressed and groomed. Charnice appeared her stated age and demonstrated adequate orientation to time, place, person, and purpose of the appointment. She also demonstrated appropriate eye contact. No psychomotor abnormalities or behavioral peculiarities noted. Her mood was {gbmood:21757} with congruent affect. Her thought processes were logical, linear, and goal-directed. No hallucinations, delusions, bizarre thinking or behavior reported or observed. Judgment, insight, and impulse control appeared to be grossly intact. There was no evidence of paraphasias (i.e., errors in speech, gross mispronunciations, and word substitutions), repetition deficits, or disturbances in volume or prosody (i.e., rhythm and intonation). There was no evidence of attention or memory impairments. Cambre denied current suicidal and homicidal ideation, intent or plan.  Structured Assessment Results: The Patient Health Questionnaire-9 (PHQ-9) is a self-report measure that assesses  symptoms and severity of depression over the course of the last two weeks. Mckinleigh obtained a score of *** suggesting {GBPHQ9SEVERITY:21752}. Kandie finds the endorsed symptoms  to be {gbphq9difficulty:21754}.  The Generalized Anxiety Disorder-7 (GAD-7) is a brief self-report measure that assesses symptoms of anxiety over the course of the last two weeks. Lucina obtained a score of *** suggesting {gbgad7severity:21753}.  Interventions: Jermiya was administered the PHQ-9 and GAD-7 for symptom monitoring. Content from the last session was reviewed. Throughout today's session, empathic reflections and validation were provided. Psychoeducation regarding *** was provided and *** [insert other interventions].   DSM-5 Diagnosis: 311 (F32.8) Other Specified Depressive Disorder, Emotional Eating  Treatment Goal & Progress: Ayjah was seen for an initial appointment with this provider on November 23, 2017 during which the following treatment goal was established: decrease emotional eating. Roxann has demonstrated progress in her goal of reducing emotional eaitng as evidenced by ***  Plan: Toleen continues to appear able and willing to participate as evidenced by engagement in reciprocal conversation, and asking questions for clarification as appropriate.*** The next appointment will be scheduled in {gbweeks:21758}. The next session will focus on reviewing learned skills, and working towards the established treatment goal.***

## 2018-03-16 ENCOUNTER — Encounter (INDEPENDENT_AMBULATORY_CARE_PROVIDER_SITE_OTHER): Payer: Self-pay

## 2018-04-07 ENCOUNTER — Other Ambulatory Visit: Payer: Self-pay | Admitting: Gastroenterology

## 2018-04-11 ENCOUNTER — Ambulatory Visit
Admission: RE | Admit: 2018-04-11 | Discharge: 2018-04-11 | Disposition: A | Discharge status: Home / Self Care | Payer: BC Managed Care – PPO | Source: Ambulatory Visit | Attending: Gastroenterology | Admitting: Gastroenterology

## 2018-04-11 MED ORDER — IOPAMIDOL (ISOVUE-300) INJECTION 61%
100.0000 mL | Freq: Once | INTRAVENOUS | Status: AC | PRN
  Administered 2018-04-11: 100 mL via INTRAVENOUS

## 2018-10-22 ENCOUNTER — Other Ambulatory Visit: Payer: Self-pay

## 2018-10-22 ENCOUNTER — Emergency Department (HOSPITAL_BASED_OUTPATIENT_CLINIC_OR_DEPARTMENT_OTHER)
Admission: EM | Admit: 2018-10-22 | Discharge: 2018-10-22 | Disposition: A | Discharge status: Home / Self Care | Payer: BC Managed Care – PPO

## 2019-06-01 ENCOUNTER — Other Ambulatory Visit: Payer: Self-pay | Admitting: Surgery

## 2019-06-01 DIAGNOSIS — Z9884 Bariatric surgery status: Secondary | ICD-10-CM

## 2019-08-08 ENCOUNTER — Other Ambulatory Visit: Payer: BC Managed Care – PPO

## 2019-08-09 ENCOUNTER — Other Ambulatory Visit: Payer: BC Managed Care – PPO

## 2019-08-16 ENCOUNTER — Ambulatory Visit
Admission: RE | Admit: 2019-08-16 | Discharge: 2019-08-16 | Disposition: A | Discharge status: Home / Self Care | Payer: BC Managed Care – PPO | Source: Ambulatory Visit | Attending: Surgery | Admitting: Surgery

## 2019-08-16 ENCOUNTER — Other Ambulatory Visit: Payer: Self-pay | Admitting: Surgery

## 2019-08-16 DIAGNOSIS — Z9884 Bariatric surgery status: Secondary | ICD-10-CM

## 2019-08-17 ENCOUNTER — Emergency Department (INDEPENDENT_AMBULATORY_CARE_PROVIDER_SITE_OTHER): Payer: BC Managed Care – PPO

## 2019-08-17 ENCOUNTER — Emergency Department (INDEPENDENT_AMBULATORY_CARE_PROVIDER_SITE_OTHER)
Admission: EM | Admit: 2019-08-17 | Discharge: 2019-08-17 | Disposition: A | Discharge status: Home / Self Care | Payer: BC Managed Care – PPO | Source: Home / Self Care | Attending: Family Medicine | Admitting: Family Medicine

## 2019-08-17 ENCOUNTER — Other Ambulatory Visit: Payer: Self-pay

## 2019-08-17 DIAGNOSIS — J4531 Mild persistent asthma with (acute) exacerbation: Secondary | ICD-10-CM | POA: Diagnosis not present

## 2019-08-17 DIAGNOSIS — K219 Gastro-esophageal reflux disease without esophagitis: Secondary | ICD-10-CM

## 2019-08-17 MED ORDER — PREDNISONE 20 MG PO TABS
ORAL_TABLET | ORAL | 0 refills | Status: DC
Start: 2019-08-17 — End: 2019-09-28

## 2019-08-17 MED ORDER — IPRATROPIUM BROMIDE HFA 17 MCG/ACT IN AERS
2.0000 | INHALATION_SPRAY | Freq: Four times a day (QID) | RESPIRATORY_TRACT | 0 refills | Status: DC
Start: 2019-08-17 — End: 2020-02-01

## 2019-08-17 NOTE — Discharge Instructions (Signed)
Resume taking Pepcid (famotidine) 20mg , one tab twice daily. Continue Symbicort and Singulair. Use albuterol and Xopenex inhalers as needed.

## 2019-08-17 NOTE — ED Triage Notes (Signed)
Dry cough x 2 months

## 2019-08-17 NOTE — ED Provider Notes (Signed)
[facility]    CSN: [id number] Arrival date & time: 08/17/19  1130      History   Chief Complaint Chief Complaint  Patient presents with  . Cough    HPI [name] is a 59 y.o. female.   Patient has asthma and complains of persistent cough and increased wheezing for about 3 months despite use of Symbicort, Singulair, Xopenex by nebulizer, and albuterol inhaler.  She notes that her cough is worse early each morning when she experiences increased mucous/phlegm in her throat.  The cough improves during the day.  She denies pleuritic pain and fevers, chills, and sweats.   She has GERD, but has not taken Nexium for about 3 months.  Patient has a history of gastric bypass and gastric banding.  She had an UGI series yesterday that showed gastric band at the GE junction, and normal gastric pouch with normal emptying into the Roux-en-Y.  The study showed poor esophageal motility with dilated esophagus and numerous tertiary contractions. She has a past history of PE in 2006 and 2009, with placement of IVC filter..    The history is provided by the patient.  Cough Cough characteristics:  Productive Sputum characteristics:  White Severity:  Moderate Duration:  12 weeks Timing:  Intermittent Progression:  Unchanged Chronicity:  Chronic Smoker: no   Context: exposure to allergens, weather changes and with activity   Context: not sick contacts, not smoke exposure and not upper respiratory infection   Relieved by:  Nothing Worsened by:  Environmental changes Ineffective treatments:  Leukotriene antagonist, beta-agonist inhaler and home nebulizer Associated symptoms: shortness of breath and wheezing   Associated symptoms: no chest pain, no chills, no diaphoresis, no ear fullness, no ear pain, no eye discharge, no fever, no headaches, no myalgias, no rash, no rhinorrhea, no sinus congestion, no sore throat and no weight loss     Past Medical History:  Diagnosis Date  .  Anemia   . Anxiety   . Arthritis    knee   . Asthma   . Back pain   . Depression   . Diabetes ([place])   . DVT (deep venous thrombosis) ([place])   . Dysfunctional uterine bleeding   . Dyspnea   . GERD (gastroesophageal reflux disease)   . Glaucoma   . Headache(784.0)    MIGRAINES IN PAST  . Hypertension   . Hypothyroidism   . Joint pain   . Lactose intolerance   . Leg edema   . Morbid obesity ([place])   . Multiple food allergies   . Optic neuropathy   . Osteoarthritis   . PE (pulmonary embolism)   . Sleep apnea, obstructive    USES C-PAP    Patient Active Problem List   Diagnosis Date Noted  . Shortness of breath on exertion 11/04/2017  . Type 2 diabetes mellitus without complication, without long-term current use of insulin ([place]) 11/04/2017  . Essential hypertension 11/04/2017  . Other headache syndrome 08/30/2014  . Speech disturbance 08/30/2014  . Vertigo 08/30/2014  . Right knee DJD 03/22/2014  . 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 Anemia 09/10/2011  . ARF (acute renal failure) ([place]) 09/10/2011  . Paresthesias 09/10/2011  . Total knee replacement status, left 09/10/2011  . Cervical spondylosis with myelopathy 06/30/2011  . Osteoarthritis of both knees 06/30/2011  . Diabetes mellitus   . Arthritis   . Sleep apnea, obstructive   . Morbid obesity ([place])   . Dysfunctional  uterine bleeding   . HEPATITIS C 01/25/2008  . HYPERTENSION 01/25/2008  . 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 ASTHMA 01/25/2008  . SLEEP APNEA 01/25/2008  . DYSPNEA 01/25/2008    Past Surgical History:  Procedure Laterality Date  . ABDOMINAL HYSTERECTOMY    . CAPSULOTOMY  05/03/2012   Procedure: MINOR CAPSULOTOMY;  Surgeon: Myrtha Mantis., MD;  Location: [facility];  Service: Ophthalmology;  Laterality: Right;  . CAPSULOTOMY  05/05/2012   Procedure: MINOR CAPSULOTOMY;  Surgeon: Myrtha Mantis., MD;  Location: [facility];  Service: Ophthalmology;  Laterality: Right;  .  CATARACT SURG    . DILATION AND CURETTAGE OF UTERUS    . ENDOMETRIAL ABLATION     failure   . EYE SURGERY  11/16/2009   FOR GLAUCOMA AND LASER SURGERY  X2  . GASTRIC BYPASS  [id number]  . GLAUCOMA SURGERY  10/31/2009  . HERNIA REPAIR    . HIATAL HERNIA REPAIR  01/30/2014   Procedure: HERNIA REPAIR HIATAL;  Surgeon: Pedro Earls, MD;  Location: [facility];  Service: General;;  . HYSTEROSCOPY    . IVC FILTER     2010 - THEN REMOVED THEN PLACED AGAIN 2012  . JOINT REPLACEMENT    . KNEE ARTHROSCOPY  12/2007  . LAPAROSCOPIC GASTRIC BANDING N/A 01/30/2014   Procedure: LAPAROSCOPIC GASTRIC BANDING over bypass ;  Surgeon: Pedro Earls, MD;  Location: [facility];  Service: General;  Laterality: N/A;  . POSTERIOR LAMINECTOMY / DECOMPRESSION CERVICAL SPINE  01/26/2011  . TOTAL KNEE ARTHROPLASTY  09/09/2011   Procedure: TOTAL KNEE ARTHROPLASTY;  Surgeon: Johnn Hai, MD;  Location: [facility];  Service: Orthopedics;  Laterality: Left;  . TOTAL KNEE ARTHROPLASTY Right 03/22/2014   Procedure: RIGHT TOTAL KNEE ARTHROPLASTY;  Surgeon: Johnn Hai, MD;  Location: [facility];  Service: Orthopedics;  Laterality: Right;  . UPPER GI ENDOSCOPY N/A 01/30/2014   Procedure: UPPER GI ENDOSCOPY;  Surgeon: Pedro Earls, MD;  Location: [facility];  Service: General;  Laterality: N/A;  . YAG LASER APPLICATION  [id number]   Procedure: YAG LASER APPLICATION;  Surgeon: Myrtha Mantis., MD;  Location: [facility];  Service: Ophthalmology;  Laterality: Right;  . YAG LASER APPLICATION  [id number]   Procedure: YAG LASER APPLICATION;  Surgeon: Myrtha Mantis., MD;  Location: [facility];  Service: Ophthalmology;  Laterality: Right;    OB History    Gravida  0   Para  0   Term  0   Preterm  0   AB  0   Living  0     SAB  0   TAB  0   Ectopic  0   Multiple  0   Live Births  0            Home Medications    Prior to Admission medications   Medication Sig Start Date End Date Taking? Authorizing  Provider  guaiFENesin (MUCINEX) 600 MG 12 hr tablet Take by mouth 2 (two) times daily.   Yes [provider]  levocetirizine (XYZAL) 2.5 MG/5ML solution Take 2.5 mg by mouth every evening.   Yes [provider]  acetaminophen (TYLENOL) 500 MG tablet Take 1,000 mg by mouth every 4 (four) hours as needed for moderate pain.    [provider]  Ascorbic Acid (VITAMIN C) 1000 MG tablet Take 1,000 mg by mouth every morning.     [provider]  b complex vitamins tablet  Take 1 tablet by mouth every morning.     [provider]  brimonidine (ALPHAGAN) 0.15 % ophthalmic solution Place 1 drop into both eyes 2 (two) times daily.     [provider]  brinzolamide (AZOPT) 1 % ophthalmic suspension Place 1 drop into both eyes 2 (two) times daily.     Lucianne Lei, MD  budesonide-formoterol [facility]) 160-4.5 MCG/ACT inhaler Inhale 2 puffs into the lungs 2 (two) times daily.    [provider]  Calcium Carbonate-Vit D-Min (CALTRATE 600+D PLUS MINERALS PO) Take 1 tablet by mouth 3 (three) times daily.     [provider]  Cyanocobalamin (VITAMIN B-12) 2500 MCG SUBL Place 2 tablets under the tongue at bedtime.    [provider]  cyclobenzaprine (FLEXERIL) 10 MG tablet Take 10 mg by mouth at bedtime.     Lucianne Lei, MD  diltiazem (DILACOR XR) 240 MG 24 hr capsule Take 240 mg by mouth at bedtime.    [provider]  Docosahexaenoic Acid (DHA PO) Take 1 tablet by mouth 2 (two) times daily.    [provider]  fish oil-omega-3 fatty acids 1000 MG capsule Take 1 g by mouth 2 (two) times daily.     [provider]  furosemide (LASIX) 40 MG tablet Take 40 mg by mouth.    [provider]  ipratropium (ATROVENT HFA) 17 MCG/ACT inhaler Inhale 2 puffs into the lungs 4 (four) times daily. 08/17/19 08/16/20  Kandra Nicolas, MD  levalbuterol Penne Lash) 1.25 MG/3ML nebulizer solution Take 1.25 mg by nebulization  every 6 (six) hours as needed for wheezing or shortness of breath.     [provider]  magnesium chloride (SLOW-MAG) 64 MG TBEC Take 1-2 tablets by mouth 2 (two) times daily. Patient takes 2 tablet every morning and 1 tablet with dinner    [provider]  mometasone (NASONEX) 50 MCG/ACT nasal spray Place 2 sprays into the nose daily as needed (allergies).     [provider]  montelukast (SINGULAIR) 10 MG tablet Take 10 mg by mouth at bedtime.    [provider]  Multiple Vitamin (MULTIVITAMIN) tablet Take 1 tablet by mouth 2 (two) times daily.     [provider]  niacin 500 MG tablet Take 500 mg by mouth daily with breakfast. Needs to be flush free per patient    Lucianne Lei, MD  PAZEO 0.7 % SOLN  07/11/14   [provider]  potassium chloride SA (K-DUR,KLOR-CON) 20 MEQ tablet Take 20-40 mEq by mouth 2 (two) times daily. 40 meq with breakfast and 20 meq with dinner    [provider]  prednisoLONE acetate (PRED FORTE) 1 % ophthalmic suspension Place 1 drop into the left eye 4 (four) times daily.    [provider]  predniSONE (DELTASONE) 20 MG tablet Take one tab by mouth twice daily for 4 days, then one daily for 3 days. Take with food. 08/17/19   Kandra Nicolas, MD  Propylene Glycol (SYSTANE BALANCE) 0.6 % SOLN Apply to eye.    [provider]  pyridOXINE (VITAMIN B-6) 100 MG tablet Take 100 mg by mouth every morning.     [provider]  quiNINE (QUALAQUIN) 324 MG capsule Take 324 mg by mouth at bedtime.     Lucianne Lei, MD  Rivaroxaban (XARELTO) 20 MG TABS Take 20 mg by mouth at bedtime.     [provider]  sodium chloride (OCEAN) 0.65 % SOLN nasal  spray Place 2 sprays into both nostrils 2 (two) times daily.    [provider]  valsartan-hydrochlorothiazide (DIOVAN-HCT) 80-12.5 MG per tablet Take 1 tablet by mouth every morning.     [provider]  Vitamin D,  Ergocalciferol, (DRISDOL) 50000 UNITS CAPS capsule Take 50,000 Units by mouth every 7 (seven) days. Patient takes on Saturday 12/06/12   [provider]    Family History Family History  Problem Relation Age of Onset  . Arthritis Mother   . Hypertension Mother   . Diabetes Mother   . Stroke Mother   . Obesity Mother   . Arthritis Father   . Diabetes Father   . Alcohol abuse Father   . Hypertension Father   . Sudden death Father   . Glaucoma Unknown   . Cerebral aneurysm Unknown   . Stroke Unknown     Social History Social History   Tobacco Use  . Smoking status: Never Smoker  . Smokeless tobacco: Never Used  Substance Use Topics  . Alcohol use: No  . Drug use: No     Allergies   Beta adrenergic blockers, Hydrocodone, Penicillins, Sulfa antibiotics, and Thimerosal   Review of Systems Review of Systems  Constitutional: Negative for appetite change, chills, diaphoresis, fatigue, fever and weight loss.  HENT: Negative for ear pain, rhinorrhea and sore throat.   Eyes: Negative for discharge.  Respiratory: Positive for cough, shortness of breath and wheezing. Negative for chest tightness.   Cardiovascular: Positive for leg swelling. Negative for chest pain.  Gastrointestinal: Negative.   Genitourinary: Negative.   Musculoskeletal: Negative for myalgias.  Skin: Negative for rash.  Neurological: Negative for headaches.     Physical Exam Triage Vital Signs ED Triage Vitals  Enc Vitals Group     BP 08/17/19 1139 (!) 164/94     Pulse Rate 08/17/19 1139 (!) 110     Resp --      Temp 08/17/19 1139 98.6 F (37 C)     Temp Source 08/17/19 1139 Oral     SpO2 08/17/19 1139 97 %     Weight 08/17/19 1140 282 lb (127.9 kg)     Height 08/17/19 1140 5\' 4"  (1.626 m)     Head Circumference --      Peak Flow --      Pain Score 08/17/19 1140 0     Pain Loc --      Pain Edu? --      Excl. in [place]? --    No data found.  Updated Vital Signs BP (!) 164/94 (BP  Location: Right Arm)   Pulse (!) 104   Temp 98.6 F (37 C) (Oral)   Ht 5\' 4"  (1.626 m)   Wt 127.9 kg   LMP 07/26/2011   SpO2 98%   BMI 48.41 kg/m   Visual Acuity Right Eye Distance:   Left Eye Distance:   Bilateral Distance:    Right Eye Near:   Left Eye Near:    Bilateral Near:     Physical Exam Nursing notes and Vital Signs reviewed. Appearance:  Patient appears stated age, obese, and in no acute distress.  She is alert and oriented. Eyes:  Pupils are equal, round, and reactive to light and accomodation.  Extraocular movement is intact.  Conjunctivae are not inflamed  Ears:  Normal externally. Nose:  Mildly congested turbinates.  No sinus tenderness.   Pharynx:  Normal Neck:  Supple.  No adenopathy  Lungs:   Bilateral diffuse wheezes.  Breath sounds are equal.  Moving air well. Heart:  Regular rate and rhythm without murmurs, rubs, or gallops.  Abdomen:  Nontender without masses or hepatosplenomegaly.  Bowel sounds are present.  No CVA or flank tenderness.  Extremities:  1+ edema.  Skin:  No rash present.   UC Treatments / Results  Labs (all labs ordered are listed, but only abnormal results are displayed) Labs Reviewed - No data to display  EKG   Radiology CLINICAL DATA:  Persistent cough for 3 months, hypertension, GERD, diabetes mellitus  EXAM: CHEST - 2 VIEW  COMPARISON:  01/24/2014  FINDINGS: Normal heart size, mediastinal contours, and pulmonary vascularity.  Mild chronic peribronchial thickening.  Subsegmental atelectasis LEFT base.  Lungs otherwise clear.  No pulmonary infiltrate, pleural effusion or pneumothorax.  Osseous structures unremarkable.  IMPRESSION: Minimal bronchitic changes and LEFT basilar atelectasis.   Electronically Signed   By: Lavonia Dana M.D.   On: 08/17/2019 12:32   Procedures Procedures (including critical care time)  Medications Ordered in UC Medications - No data to display  Initial Impression  / Assessment and Plan / UC Course  I have reviewed the triage vital signs and the nursing notes.  Pertinent labs & imaging results that were available during my care of the patient were reviewed by me and considered in my medical decision making (see chart for details).    Suspect decreased asthmatic control as a result of poorly controlled GERD Resume Pepcid. Begin prednisone burst/taper. Begin trial of Atrovent HFA. Recommend follow-up and evaluation by a pulmonologist.   Final Clinical Impressions(s) / UC Diagnoses   Final diagnoses:  Mild persistent asthmatic bronchitis with acute exacerbation  Gastroesophageal reflux disease without esophagitis     Discharge Instructions     Resume taking Pepcid (famotidine) 20mg , one tab twice daily. Continue Symbicort and Singulair. Use albuterol and Xopenex inhalers as needed.    ED Prescriptions    Medication Sig Dispense Auth. Provider   predniSONE (DELTASONE) 20 MG tablet Take one tab by mouth twice daily for 4 days, then one daily for 3 days. Take with food. 11 tablet Kandra Nicolas, MD   ipratropium (ATROVENT HFA) 17 MCG/ACT inhaler Inhale 2 puffs into the lungs 4 (four) times daily. 1 Inhaler Kandra Nicolas, MD        Kandra Nicolas, MD 08/17/19 1318

## 2019-08-23 ENCOUNTER — Ambulatory Visit: Payer: BC Managed Care – PPO | Admitting: Physical Therapy

## 2019-08-29 ENCOUNTER — Ambulatory Visit: Payer: BC Managed Care – PPO | Attending: General Surgery | Admitting: Physical Therapy

## 2019-08-29 ENCOUNTER — Other Ambulatory Visit: Payer: Self-pay

## 2019-08-29 ENCOUNTER — Encounter: Payer: Self-pay | Admitting: Physical Therapy

## 2019-08-29 DIAGNOSIS — R279 Unspecified lack of coordination: Secondary | ICD-10-CM | POA: Insufficient documentation

## 2019-08-29 DIAGNOSIS — M6281 Muscle weakness (generalized): Secondary | ICD-10-CM | POA: Insufficient documentation

## 2019-08-29 NOTE — Therapy (Signed)
[facility] [address] [place], [place], [address] Phone: [phone]   Fax:  [phone]  Physical Therapy Evaluation  Patient Details  Name: [name] MRN: [id number] Date of Birth: Feb 25, 1961 Referring Provider (PT): Leighton Ruff, MD   Encounter Date: 08/29/2019  PT End of Session - 08/29/19 1102    Visit Number  1    Date for PT Re-Evaluation  11/21/19    PT Start Time  1031   arrived late   PT Stop Time  1102    PT Time Calculation (min)  31 min    Activity Tolerance  Patient tolerated treatment well    Behavior During Therapy  [facility] for tasks assessed/performed       Past Medical History:  Diagnosis Date  . Anemia   . Anxiety   . Arthritis    knee   . Asthma   . Back pain   . Depression   . Diabetes ([place])   . DVT (deep venous thrombosis) ([place])   . Dysfunctional uterine bleeding   . Dyspnea   . GERD (gastroesophageal reflux disease)   . Glaucoma   . Headache(784.0)    MIGRAINES IN PAST  . Hypertension   . Hypothyroidism   . Joint pain   . Lactose intolerance   . Leg edema   . Morbid obesity ([place])   . Multiple food allergies   . Optic neuropathy   . Osteoarthritis   . PE (pulmonary embolism)   . Sleep apnea, obstructive    USES C-PAP    Past Surgical History:  Procedure Laterality Date  . CAPSULOTOMY  05/03/2012   Procedure: MINOR CAPSULOTOMY;  Surgeon: Myrtha Mantis., MD;  Location: [place];  Service: Ophthalmology;  Laterality: Right;  . CAPSULOTOMY  05/05/2012   Procedure: MINOR CAPSULOTOMY;  Surgeon: Myrtha Mantis., MD;  Location: [place];  Service: Ophthalmology;  Laterality: Right;  . CATARACT SURG    . DILATION AND CURETTAGE OF UTERUS    . ENDOMETRIAL ABLATION     failure   . EYE SURGERY  11/16/2009   FOR GLAUCOMA AND LASER SURGERY  X2  . GASTRIC BYPASS  [id number]  . GLAUCOMA SURGERY  10/31/2009  . HERNIA REPAIR    . HIATAL HERNIA REPAIR  01/30/2014   Procedure: HERNIA REPAIR HIATAL;  Surgeon: Pedro Earls, MD;  [facility];  Service: General;;  . HYSTEROSCOPY    . IVC FILTER     2010 - THEN REMOVED THEN PLACED AGAIN 2012  . JOINT REPLACEMENT    . KNEE ARTHROSCOPY  12/2007  . LAPAROSCOPIC GASTRIC BANDING N/A 01/30/2014   Procedure: LAPAROSCOPIC GASTRIC BANDING over bypass ;  Surgeon: Pedro Earls, MD;  [facility];  Service: General;  Laterality: N/A;  . POSTERIOR LAMINECTOMY / DECOMPRESSION CERVICAL SPINE  01/26/2011  . TOTAL KNEE ARTHROPLASTY  09/09/2011   Procedure: TOTAL KNEE ARTHROPLASTY;  Surgeon: Johnn Hai, MD;  [facility];  Service: Orthopedics;  Laterality: Left;  . TOTAL KNEE ARTHROPLASTY Right 03/22/2014   Procedure: RIGHT TOTAL KNEE ARTHROPLASTY;  Surgeon: Johnn Hai, MD;  [facility];  Service: Orthopedics;  Laterality: Right;  . UPPER GI ENDOSCOPY N/A 01/30/2014   Procedure: UPPER GI ENDOSCOPY;  Surgeon: Pedro Earls, MD;  [facility];  Service: General;  Laterality: N/A;  . YAG LASER APPLICATION  [id number]   Procedure: YAG LASER APPLICATION;  Surgeon: Myrtha Mantis., MD;  Location: [place] [facility];  Service: Ophthalmology;  Laterality: Right;  . YAG LASER APPLICATION  [id number]   Procedure: YAG LASER APPLICATION;  Surgeon: Myrtha Mantis., MD;  Location: [place];  Service: Ophthalmology;  Laterality: Right;    There were no vitals filed for this visit.   Subjective Assessment - 08/29/19 1038    Subjective  Pt states she has no sensation when sitting to know when she will have a BM.  I feel it in the morning when I am up and moving around.  Pt states she will have BM at least 2x/day.  Pt states she feels like it seems like she doesn't finish or completely empty.  Pt states she has leakage about 3-4x/month.    Patient Stated Goals  not have the leakage         [facility] PT Assessment - 08/29/19 0001      Assessment   Medical Diagnosis  R15.9,R15.0 (ICD-10-CM) - Fecal  incontinence with incomplete defecation    Referring Provider (PT)  Leighton Ruff, MD      Precautions   Precautions  None      Balance Screen   Has the patient fallen in the past 6 months  No      [place] residence    Living Arrangements  Alone      Prior Function   Level of Independence  Independent    Vocation  Full time employment    Vocation Requirements  sitting mostly      Cognition   Overall Cognitive Status  Within Functional Limits for tasks assessed      Posture/Postural Control   Posture/Postural Control  Postural limitations                Objective measurements completed on examination: See above findings.    Pelvic Floor Special Questions - 08/29/19 0001    Prior Pelvic/Prostate Exam  Yes    Prior Pregnancies  No    Urinary Leakage  Yes    How often  1x/ week    Pad use  2/day    Activities that cause leaking  Other    Other activities that cause leaking  eating sugar or being tense and stressed    Urinary urgency  Yes   every now and then   Fecal incontinence  Yes    Fluid intake  64 oz    Pelvic Floor Internal Exam  pt identity confirmed and internal assessment performed                            Plan - 08/29/19 1521    Clinical Impression Statement  Pt presents to skilled PT today due to fecal incontinence.  She states she has had this issue for a while and happens when she is unaware of anything happening, usually after sitting for a while    Personal Factors and Comorbidities  Comorbidity 3+    Comorbidities  hiatal hernia repair; bilat TKA; obesity    Examination-Activity Limitations  Continence;Sit    Examination-Participation Restrictions  Community Activity    Stability/Clinical Decision Making  Unstable/Unpredictable    Clinical Decision Making  Moderate    PT Frequency  1x / week    PT Duration  12 weeks    PT Treatment/Interventions  ADLs/Self Care Home  Management;Aquatic Therapy;Biofeedback;Cryotherapy;Electrical Stimulation;Neuromuscular re-education;Therapeutic exercise;Therapeutic activities;Patient/family education;Wheelchair mobility training;Passive range of motion;Dry needling;Manual techniques;Taping;Moist Heat  PT Next Visit Plan  assess hip ROM and strength (unable due to late arrival); core and pelvic floor exercises intial HEP    Consulted and Agree with Plan of Care  Patient       Patient will benefit from skilled therapeutic intervention in order to improve the following deficits and impairments:  Postural dysfunction, Decreased strength, Decreased coordination  Visit Diagnosis: Muscle weakness (generalized)  Unspecified lack of coordination     Problem List Patient Active Problem List   Diagnosis Date Noted  . Shortness of breath on exertion 11/04/2017  . Type 2 diabetes mellitus without complication, without long-term current use of insulin ([place]) 11/04/2017  . Essential hypertension 11/04/2017  . Other headache syndrome 08/30/2014  . Speech disturbance 08/30/2014  . Vertigo 08/30/2014  . Right knee DJD 03/22/2014  . 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 Anemia 09/10/2011  . ARF (acute renal failure) ([place]) 09/10/2011  . Paresthesias 09/10/2011  . Total knee replacement status, left 09/10/2011  . Cervical spondylosis with myelopathy 06/30/2011  . Osteoarthritis of both knees 06/30/2011  . Diabetes mellitus   . Arthritis   . Sleep apnea, obstructive   . Morbid obesity ([place])   . Dysfunctional uterine bleeding   . HEPATITIS C 01/25/2008  . HYPERTENSION 01/25/2008  . 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 ASTHMA 01/25/2008  . SLEEP APNEA 01/25/2008  . DYSPNEA 01/25/2008    Jule Ser, PT 08/29/2019, 5:07 PM  [facility] [address] [place], [place], [address] Phone: [phone]   Fax:  [phone]  Name: Erin Weidemann  [name] MRN: [id number] Date of Birth: Jan 01, 1961

## 2019-08-30 NOTE — Addendum Note (Signed)
Addended by: Su Hoff on: 08/30/2019 10:27 AM   Modules accepted: Orders

## 2019-08-30 NOTE — Therapy (Signed)
[facility] [address], [place], [place], [address] Phone: [phone]   Fax:  [phone]  Physical Therapy Evaluation  Patient Details  Name: [name] MRN: [id number] Date of Birth: Apr 12, 1961 Referring Provider (PT): Leighton Ruff, MD   Encounter Date: 08/29/2019  PT End of Session - 08/29/19 1102    Visit Number  1    Date for PT Re-Evaluation  11/21/19    PT Start Time  1031   arrived late   PT Stop Time  1102    PT Time Calculation (min)  31 min    Activity Tolerance  Patient tolerated treatment well    Behavior During Therapy  [facility] for tasks assessed/performed       Past Medical History:  Diagnosis Date  . Anemia   . Anxiety   . Arthritis    knee   . Asthma   . Back pain   . Depression   . Diabetes ([place])   . DVT (deep venous thrombosis) ([place])   . Dysfunctional uterine bleeding   . Dyspnea   . GERD (gastroesophageal reflux disease)   . Glaucoma   . Headache(784.0)    MIGRAINES IN PAST  . Hypertension   . Hypothyroidism   . Joint pain   . Lactose intolerance   . Leg edema   . Morbid obesity ([place])   . Multiple food allergies   . Optic neuropathy   . Osteoarthritis   . PE (pulmonary embolism)   . Sleep apnea, obstructive    USES C-PAP    Past Surgical History:  Procedure Laterality Date  . CAPSULOTOMY  05/03/2012   Procedure: MINOR CAPSULOTOMY;  Surgeon: Myrtha Mantis., MD;  Location: [place];  Service: Ophthalmology;  Laterality: Right;  . CAPSULOTOMY  05/05/2012   Procedure: MINOR CAPSULOTOMY;  Surgeon: Myrtha Mantis., MD;  Location: [place];  Service: Ophthalmology;  Laterality: Right;  . CATARACT SURG    . DILATION AND CURETTAGE OF UTERUS    . ENDOMETRIAL ABLATION     failure   . EYE SURGERY  11/16/2009   FOR GLAUCOMA AND LASER SURGERY  X2  . GASTRIC BYPASS  [id number]  . GLAUCOMA SURGERY  10/31/2009  . HERNIA REPAIR    . HIATAL HERNIA REPAIR  01/30/2014    Procedure: HERNIA REPAIR HIATAL;  Surgeon: Pedro Earls, MD;  [facility];  Service: General;;  . HYSTEROSCOPY    . IVC FILTER     2010 - THEN REMOVED THEN PLACED AGAIN 2012  . JOINT REPLACEMENT    . KNEE ARTHROSCOPY  12/2007  . LAPAROSCOPIC GASTRIC BANDING N/A 01/30/2014   Procedure: LAPAROSCOPIC GASTRIC BANDING over bypass ;  Surgeon: Pedro Earls, MD;  [facility];  Service: General;  Laterality: N/A;  . POSTERIOR LAMINECTOMY / DECOMPRESSION CERVICAL SPINE  01/26/2011  . TOTAL KNEE ARTHROPLASTY  09/09/2011   Procedure: TOTAL KNEE ARTHROPLASTY;  Surgeon: Johnn Hai, MD;  [facility];  Service: Orthopedics;  Laterality: Left;  . TOTAL KNEE ARTHROPLASTY Right 03/22/2014   Procedure: RIGHT TOTAL KNEE ARTHROPLASTY;  Surgeon: Johnn Hai, MD;  [facility];  Service: Orthopedics;  Laterality: Right;  . UPPER GI ENDOSCOPY N/A 01/30/2014   Procedure: UPPER GI ENDOSCOPY;  Surgeon: Pedro Earls, MD;  [facility];  Service: General;  Laterality: N/A;  . YAG LASER APPLICATION  [id number]   Procedure: YAG LASER APPLICATION;  Surgeon: Myrtha Mantis.,  MD;  Location: [place];  Service: Ophthalmology;  Laterality: Right;  . YAG LASER APPLICATION  [id number]   Procedure: YAG LASER APPLICATION;  Surgeon: Myrtha Mantis., MD;  Location: [place];  Service: Ophthalmology;  Laterality: Right;    There were no vitals filed for this visit.   Subjective Assessment - 08/29/19 1038    Subjective  Pt states she has no sensation when sitting to know when she will have a BM.  I feel it in the morning when I am up and moving around.  Pt states she will have BM at least 2x/day.  Pt states she feels like it seems like she doesn't finish or completely empty.  Pt states she has leakage about 3-4x/month.    Patient Stated Goals  not have the leakage                    Objective measurements completed on examination: See above findings.                 PT Short Term Goals - 08/30/19 1013      PT SHORT TERM GOAL #1   Title  ind with initial HEP    Time  4    Period  Weeks    Status  New    Target Date  09/26/19        PT Long Term Goals - 08/30/19 1014      PT LONG TERM GOAL #1   Title  Pt will be ind with HEP    Time  8    Period  Weeks    Status  New    Target Date  11/21/19      PT LONG TERM GOAL #2   Title  Pt will report improved sensation and can tell when she is going to have a BM    Time  12    Period  Weeks    Status  New    Target Date  11/21/19      PT LONG TERM GOAL #3   Title  Pt will be able to contract with 3/5 MMT and hold for at least 10 sec for improved bowel control    Baseline  2/5 for 5 sec    Time  12    Period  Weeks    Status  New    Target Date  11/21/19             Plan - 08/29/19 1521    Clinical Impression Statement  Pt presents to skilled PT today due to fecal incontinence.  She states she has had this issue for a while and happens when she is unaware of anything happening, usually after sitting for a while.  Only able to assess posture and gross strength as well as pelvic assessment due to limited time pt arriving late.  Pt has decreased sensation without reflex noted.  Pt has decreased strength and endurance as stated.  See above for postural impairments.  Pt will benefit from skilled PT to address impairments and improve continence during functional activities.    Personal Factors and Comorbidities  Comorbidity 3+    Comorbidities  hiatal hernia repair; bilat TKA; obesity    Examination-Activity Limitations  Continence;Sit    Examination-Participation Restrictions  Community Activity    Stability/Clinical Decision Making  Unstable/Unpredictable    Clinical Decision Making  Moderate    PT Frequency  1x / week    PT Duration  12  weeks    PT Treatment/Interventions  ADLs/Self Care Home Management;Aquatic Therapy;Biofeedback;Cryotherapy;Electrical  Stimulation;Neuromuscular re-education;Therapeutic exercise;Therapeutic activities;Patient/family education;Wheelchair mobility training;Passive range of motion;Dry needling;Manual techniques;Taping;Moist Heat    PT Next Visit Plan  assess hip ROM and strength (unable due to late arrival); core and pelvic floor exercises intial HEP    Consulted and Agree with Plan of Care  Patient       Patient will benefit from skilled therapeutic intervention in order to improve the following deficits and impairments:  Postural dysfunction, Decreased strength, Decreased coordination  Visit Diagnosis: Muscle weakness (generalized)  Unspecified lack of coordination     Problem List Patient Active Problem List   Diagnosis Date Noted  . Shortness of breath on exertion 11/04/2017  . Type 2 diabetes mellitus without complication, without long-term current use of insulin ([place]) 11/04/2017  . Essential hypertension 11/04/2017  . Other headache syndrome 08/30/2014  . Speech disturbance 08/30/2014  . Vertigo 08/30/2014  . Right knee DJD 03/22/2014  . 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 Anemia 09/10/2011  . ARF (acute renal failure) ([place]) 09/10/2011  . Paresthesias 09/10/2011  . Total knee replacement status, left 09/10/2011  . Cervical spondylosis with myelopathy 06/30/2011  . Osteoarthritis of both knees 06/30/2011  . Diabetes mellitus   . Arthritis   . Sleep apnea, obstructive   . Morbid obesity ([place])   . Dysfunctional uterine bleeding   . HEPATITIS C 01/25/2008  . HYPERTENSION 01/25/2008  . 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 ASTHMA 01/25/2008  . SLEEP APNEA 01/25/2008  . DYSPNEA 01/25/2008    Jule Ser, PT 08/30/2019, 10:22 AM  [facility] [address], [place], [place], [address] Phone: [phone]   Fax:  [phone]  Name: [name] MRN: [id number] Date of Birth: 1960/12/22

## 2019-08-30 NOTE — Therapy (Signed)
[facility] [address] [place], [place], [address] Phone: [phone]   Fax:  [phone]  Physical Therapy Evaluation  Patient Details  Name: [name] MRN: [id number] Date of Birth: [birth year] Referring Provider (PT): Leighton Ruff, MD   Encounter Date: 08/29/2019  PT End of Session - 08/29/19 1102    Visit Number  1    Date for PT Re-Evaluation  11/21/19    PT Start Time  1031   arrived late   PT Stop Time  1102    PT Time Calculation (min)  31 min    Activity Tolerance  Patient tolerated treatment well    Behavior During Therapy  [facility] for tasks assessed/performed       Past Medical History:  Diagnosis Date  . Anemia   . Anxiety   . Arthritis    knee   . Asthma   . Back pain   . Depression   . Diabetes ([place])   . DVT (deep venous thrombosis) ([place])   . Dysfunctional uterine bleeding   . Dyspnea   . GERD (gastroesophageal reflux disease)   . Glaucoma   . Headache(784.0)    MIGRAINES IN PAST  . Hypertension   . Hypothyroidism   . Joint pain   . Lactose intolerance   . Leg edema   . Morbid obesity ([place])   . Multiple food allergies   . Optic neuropathy   . Osteoarthritis   . PE (pulmonary embolism)   . Sleep apnea, obstructive    USES C-PAP    Past Surgical History:  Procedure Laterality Date  . CAPSULOTOMY  05/03/2012   Procedure: MINOR CAPSULOTOMY;  Surgeon: Myrtha Mantis., MD;  Location: [place];  Service: Ophthalmology;  Laterality: Right;  . CAPSULOTOMY  05/05/2012   Procedure: MINOR CAPSULOTOMY;  Surgeon: Myrtha Mantis., MD;  Location: [place];  Service: Ophthalmology;  Laterality: Right;  . CATARACT SURG    . DILATION AND CURETTAGE OF UTERUS    . ENDOMETRIAL ABLATION     failure   . EYE SURGERY  11/16/2009   FOR GLAUCOMA AND LASER SURGERY  X2  . GASTRIC BYPASS  [id number]  . GLAUCOMA SURGERY  10/31/2009  . HERNIA REPAIR    . HIATAL HERNIA REPAIR  01/30/2014    Procedure: HERNIA REPAIR HIATAL;  Surgeon: Pedro Earls, MD;  [facility];  Service: General;;  . HYSTEROSCOPY    . IVC FILTER     2010 - THEN REMOVED THEN PLACED AGAIN 2012  . JOINT REPLACEMENT    . KNEE ARTHROSCOPY  12/2007  . LAPAROSCOPIC GASTRIC BANDING N/A 01/30/2014   Procedure: LAPAROSCOPIC GASTRIC BANDING over bypass ;  Surgeon: Pedro Earls, MD;  [facility];  Service: General;  Laterality: N/A;  . POSTERIOR LAMINECTOMY / DECOMPRESSION CERVICAL SPINE  01/26/2011  . TOTAL KNEE ARTHROPLASTY  09/09/2011   Procedure: TOTAL KNEE ARTHROPLASTY;  Surgeon: Johnn Hai, MD;  [facility];  Service: Orthopedics;  Laterality: Left;  . TOTAL KNEE ARTHROPLASTY Right 03/22/2014   Procedure: RIGHT TOTAL KNEE ARTHROPLASTY;  Surgeon: Johnn Hai, MD;  [facility];  Service: Orthopedics;  Laterality: Right;  . UPPER GI ENDOSCOPY N/A 01/30/2014   Procedure: UPPER GI ENDOSCOPY;  Surgeon: Pedro Earls, MD;  [facility];  Service: General;  Laterality: N/A;  . YAG LASER APPLICATION  [id number]   Procedure: YAG LASER APPLICATION;  Surgeon: Myrtha Mantis.,  MD;  Location: [place];  Service: Ophthalmology;  Laterality: Right;  . YAG LASER APPLICATION  [id number]   Procedure: YAG LASER APPLICATION;  Surgeon: Myrtha Mantis., MD;  Location: [place];  Service: Ophthalmology;  Laterality: Right;    There were no vitals filed for this visit.   Subjective Assessment - 08/29/19 1038    Subjective  Pt states she has no sensation when sitting to know when she will have a BM.  I feel it in the morning when I am up and moving around.  Pt states she will have BM at least 2x/day.  Pt states she feels like it seems like she doesn't finish or completely empty.  Pt states she has leakage about 3-4x/month.    Patient Stated Goals  not have the leakage         [facility] PT Assessment - 08/30/19 0001      Assessment   Medical Diagnosis  R15.9,R15.0 (ICD-10-CM) - Fecal  incontinence with incomplete defecation    Referring Provider (PT)  Leighton Ruff, MD      Precautions   Precautions  None      Balance Screen   Has the patient fallen in the past 6 months  No      [place] residence    Living Arrangements  Alone      Prior Function   Level of Independence  Independent    Vocation  Full time employment    Vocation Requirements  sitting mostly      Cognition   Overall Cognitive Status  Within Functional Limits for tasks assessed      Posture/Postural Control   Posture/Postural Control  Postural limitations    Postural Limitations  Increased lumbar lordosis;Increased thoracic kyphosis;Anterior pelvic tilt      ROM / Strength   AROM / PROM / Strength  Strength      Strength   Overall Strength Comments  core and LE grossly 4/5      Ambulation/Gait   Gait Pattern  Decreased stride length;Wide base of support                Objective measurements completed on examination: See above findings.    Pelvic Floor Special Questions - 08/30/19 0001    Prior Pelvic/Prostate Exam  Yes    Urinary Leakage  Yes    How often  1x/ week    Pad use  2/day    Activities that cause leaking  Other    Other activities that cause leaking  eating sugar or being tense and stressed    Urinary urgency  Yes   every now and then   Fecal incontinence  Yes    Fluid intake  64 oz    Skin Integrity  Hemorroids    Pelvic Floor Internal Exam  pt identity confirmed and internal assessment performed    Exam Type  Rectal    Sensation  no reflex noted    Palpation  some tightening with attempt to expel from rectum    Strength  weak squeeze, no lift    Strength # of seconds  5    Tone  low                 PT Short Term Goals - 08/30/19 1013      PT SHORT TERM GOAL #1   Title  ind with initial HEP    Time  4    Period  Weeks    Status  New    Target Date  09/26/19        PT Long Term Goals -  08/30/19 1014      PT LONG TERM GOAL #1   Title  Pt will be ind with HEP    Time  8    Period  Weeks    Status  New    Target Date  11/21/19      PT LONG TERM GOAL #2   Title  Pt will report improved sensation and can tell when she is going to have a BM    Time  12    Period  Weeks    Status  New    Target Date  11/21/19      PT LONG TERM GOAL #3   Title  Pt will be able to contract with 3/5 MMT and hold for at least 10 sec for improved bowel control    Baseline  2/5 for 5 sec    Time  12    Period  Weeks    Status  New    Target Date  11/21/19             Plan - 08/29/19 1521    Clinical Impression Statement  Pt presents to skilled PT today due to fecal incontinence.  She states she has had this issue for a while and happens when she is unaware of anything happening, usually after sitting for a while.  Only able to assess posture and gross strength as well as pelvic assessment due to limited time pt arriving late.  Pt has decreased sensation without reflex noted.  Pt has decreased strength and endurance as stated.  See above for postural impairments.  Pt will benefit from skilled PT to address impairments and improve continence during functional activities.    Personal Factors and Comorbidities  Comorbidity 3+    Comorbidities  hiatal hernia repair; bilat TKA; obesity    Examination-Activity Limitations  Continence;Sit    Examination-Participation Restrictions  Community Activity    Stability/Clinical Decision Making  Unstable/Unpredictable    Clinical Decision Making  Moderate    PT Frequency  1x / week    PT Duration  12 weeks    PT Treatment/Interventions  ADLs/Self Care Home Management;Aquatic Therapy;Biofeedback;Cryotherapy;Electrical Stimulation;Neuromuscular re-education;Therapeutic exercise;Therapeutic activities;Patient/family education;Wheelchair mobility training;Passive range of motion;Dry needling;Manual techniques;Taping;Moist Heat    PT Next Visit Plan   assess hip ROM and strength (unable due to late arrival); core and pelvic floor exercises intial HEP    Consulted and Agree with Plan of Care  Patient       Patient will benefit from skilled therapeutic intervention in order to improve the following deficits and impairments:  Postural dysfunction, Decreased strength, Decreased coordination  Visit Diagnosis: Muscle weakness (generalized)  Unspecified lack of coordination     Problem List Patient Active Problem List   Diagnosis Date Noted  . Shortness of breath on exertion 11/04/2017  . Type 2 diabetes mellitus without complication, without long-term current use of insulin ([place]) 11/04/2017  . Essential hypertension 11/04/2017  . Other headache syndrome 08/30/2014  . Speech disturbance 08/30/2014  . Vertigo 08/30/2014  . Right knee DJD 03/22/2014  . 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 Anemia 09/10/2011  . ARF (acute renal failure) ([place]) 09/10/2011  . Paresthesias 09/10/2011  . Total knee replacement status, left 09/10/2011  . Cervical spondylosis with myelopathy 06/30/2011  . Osteoarthritis of both  knees 06/30/2011  . Diabetes mellitus   . Arthritis   . Sleep apnea, obstructive   . Morbid obesity ([place])   . Dysfunctional uterine bleeding   . HEPATITIS C 01/25/2008  . HYPERTENSION 01/25/2008  . 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 ASTHMA 01/25/2008  . SLEEP APNEA 01/25/2008  . DYSPNEA 01/25/2008    Jule Ser, PT 08/30/2019, 10:24 AM  [facility] [address], [place], [place], [address] Phone: [phone]   Fax:  [phone]  Name: [name] MRN: [id number] Date of Birth: 1960/12/11

## 2019-09-04 ENCOUNTER — Other Ambulatory Visit: Payer: Self-pay

## 2019-09-04 ENCOUNTER — Encounter (INDEPENDENT_AMBULATORY_CARE_PROVIDER_SITE_OTHER): Payer: Self-pay | Admitting: Ophthalmology

## 2019-09-04 ENCOUNTER — Ambulatory Visit (INDEPENDENT_AMBULATORY_CARE_PROVIDER_SITE_OTHER): Payer: BC Managed Care – PPO | Admitting: Ophthalmology

## 2019-09-04 DIAGNOSIS — H34812 Central retinal vein occlusion, left eye, with macular edema: Secondary | ICD-10-CM | POA: Diagnosis not present

## 2019-09-04 DIAGNOSIS — H348121 Central retinal vein occlusion, left eye, with retinal neovascularization: Secondary | ICD-10-CM | POA: Insufficient documentation

## 2019-09-04 DIAGNOSIS — H3522 Other non-diabetic proliferative retinopathy, left eye: Secondary | ICD-10-CM | POA: Insufficient documentation

## 2019-09-04 DIAGNOSIS — H401133 Primary open-angle glaucoma, bilateral, severe stage: Secondary | ICD-10-CM | POA: Diagnosis not present

## 2019-09-04 NOTE — Progress Notes (Signed)
09/04/2019     CHIEF COMPLAINT Patient presents for Retina Follow Up   HISTORY OF PRESENT ILLNESS: [name] is a 59 y.o. female who presents to the clinic today for:   HPI    Retina Follow Up    Patient presents with  CRVO/BRVO.  In both eyes.  Severity is moderate.  Duration of 9 months.  Since onset it is stable.  I, the attending physician,  performed the HPI with the patient and updated documentation appropriately.          Comments    9 Month f\u OU. OCT  Pt states vision is stable. Denies FOL and floaters. Using gtts as directed. Pt was seen by Dr. Kathlen Mody last week       Last edited by Tilda Franco on 09/04/2019  9:28 AM. (History)      Referring physician: Lucianne Lei, MD [address] [place],  [place] [address]  HISTORICAL INFORMATION:   Selected notes from the MEDICAL RECORD NUMBER    Lab Results  Component Value Date   HGBA1C 5.5 11/04/2017     CURRENT MEDICATIONS: Current Outpatient Medications (Ophthalmic Drugs)  Medication Sig  . bimatoprost (LUMIGAN) 0.01 % SOLN 1 drop at bedtime.  . brimonidine (ALPHAGAN) 0.15 % ophthalmic solution Place 1 drop into both eyes 2 (two) times daily.   . brinzolamide (AZOPT) 1 % ophthalmic suspension Place 1 drop into both eyes 2 (two) times daily.   . COMBIGAN 0.2-0.5 % ophthalmic solution Apply 1 drop to eye 3 (three) times daily.  Marland Kitchen PAZEO 0.7 % SOLN   . prednisoLONE acetate (PRED FORTE) 1 % ophthalmic suspension Place 1 drop into the left eye 4 (four) times daily.  Marland Kitchen Propylene Glycol (SYSTANE BALANCE) 0.6 % SOLN Apply to eye.   No current facility-administered medications for this visit. (Ophthalmic Drugs)   Current Outpatient Medications (Other)  Medication Sig  . ALPRAZolam (XANAX) 0.5 MG tablet Take by mouth.  Marland Kitchen acetaminophen (TYLENOL) 500 MG tablet Take 1,000 mg by mouth every 4 (four) hours as needed for moderate pain.  . Ascorbic Acid (VITAMIN C) 1000 MG tablet Take 1,000 mg by mouth every  morning.   Marland Kitchen b complex vitamins tablet Take 1 tablet by mouth every morning.   . budesonide-formoterol (SYMBICORT) 160-4.5 MCG/ACT inhaler Inhale 2 puffs into the lungs 2 (two) times daily.  . Calcium Carbonate-Vit D-Min (CALTRATE 600+D PLUS MINERALS PO) Take 1 tablet by mouth 3 (three) times daily.   . CELEBREX 100 MG capsule   . Cyanocobalamin (VITAMIN B-12) 2500 MCG SUBL Place 2 tablets under the tongue at bedtime.  . cyclobenzaprine (FLEXERIL) 10 MG tablet Take 10 mg by mouth at bedtime.   Marland Kitchen diltiazem (DILACOR XR) 240 MG 24 hr capsule Take 240 mg by mouth at bedtime.  . Docosahexaenoic Acid (DHA PO) Take 1 tablet by mouth 2 (two) times daily.  . fish oil-omega-3 fatty acids 1000 MG capsule Take 1 g by mouth 2 (two) times daily.   . furosemide (LASIX) 40 MG tablet Take 40 mg by mouth.  Marland Kitchen guaiFENesin (MUCINEX) 600 MG 12 hr tablet Take by mouth 2 (two) times daily.  Marland Kitchen ipratropium (ATROVENT HFA) 17 MCG/ACT inhaler Inhale 2 puffs into the lungs 4 (four) times daily.  Marland Kitchen levalbuterol (XOPENEX) 1.25 MG/3ML nebulizer solution Take 1.25 mg by nebulization every 6 (six) hours as needed for wheezing or shortness of breath.   . levocetirizine (XYZAL) 2.5 MG/5ML solution Take  2.5 mg by mouth every evening.  . magnesium chloride (SLOW-MAG) 64 MG TBEC Take 1-2 tablets by mouth 2 (two) times daily. Patient takes 2 tablet every morning and 1 tablet with dinner  . mometasone (NASONEX) 50 MCG/ACT nasal spray Place 2 sprays into the nose daily as needed (allergies).   . montelukast (SINGULAIR) 10 MG tablet Take 10 mg by mouth at bedtime.  . Multiple Vitamin (MULTIVITAMIN) tablet Take 1 tablet by mouth 2 (two) times daily.   . niacin 500 MG tablet Take 500 mg by mouth daily with breakfast. Needs to be flush free per patient  . potassium chloride SA (K-DUR,KLOR-CON) 20 MEQ tablet Take 20-40 mEq by mouth 2 (two) times daily. 40 meq with breakfast and 20 meq with dinner  . predniSONE (DELTASONE) 20 MG tablet Take  one tab by mouth twice daily for 4 days, then one daily for 3 days. Take with food.  . pyridOXINE (VITAMIN B-6) 100 MG tablet Take 100 mg by mouth every morning.   . quiNINE (QUALAQUIN) 324 MG capsule Take 324 mg by mouth at bedtime.   . Rivaroxaban (XARELTO) 20 MG TABS Take 20 mg by mouth at bedtime.   . sodium chloride (OCEAN) 0.65 % SOLN nasal spray Place 2 sprays into both nostrils 2 (two) times daily.  . valsartan-hydrochlorothiazide (DIOVAN-HCT) 80-12.5 MG per tablet Take 1 tablet by mouth every morning.   . Vitamin D, Ergocalciferol, (DRISDOL) 50000 UNITS CAPS capsule Take 50,000 Units by mouth every 7 (seven) days. Patient takes on Saturday   No current facility-administered medications for this visit. (Other)      REVIEW OF SYSTEMS:    ALLERGIES Allergies  Allergen Reactions  . Beta Adrenergic Blockers Other (See Comments)    RED EYES AND CONGESTION  . Hydrocodone   . Penicillins Hives, Itching and Swelling  . Sulfa Antibiotics Hives    RED EYES  . Thimerosal Hives and Itching    PAST MEDICAL HISTORY Past Medical History:  Diagnosis Date  . Anemia   . Anxiety   . Arthritis    knee   . Asthma   . Back pain   . Depression   . Diabetes ([place])   . DVT (deep venous thrombosis) ([place])   . Dysfunctional uterine bleeding   . Dyspnea   . GERD (gastroesophageal reflux disease)   . Glaucoma   . Headache(784.0)    MIGRAINES IN PAST  . Hypertension   . Hypothyroidism   . Joint pain   . Lactose intolerance   . Leg edema   . Morbid obesity ([place])   . Multiple food allergies   . Optic neuropathy   . Osteoarthritis   . PE (pulmonary embolism)   . Sleep apnea, obstructive    USES C-PAP   Past Surgical History:  Procedure Laterality Date  . CAPSULOTOMY  05/03/2012   Procedure: MINOR CAPSULOTOMY;  Surgeon: Myrtha Mantis., MD;  Location: [facility];  Service: Ophthalmology;  Laterality: Right;  . CAPSULOTOMY  05/05/2012   Procedure: MINOR CAPSULOTOMY;  Surgeon:  Myrtha Mantis., MD;  Location: [facility];  Service: Ophthalmology;  Laterality: Right;  . CATARACT SURG    . DILATION AND CURETTAGE OF UTERUS    . ENDOMETRIAL ABLATION     failure   . EYE SURGERY  11/16/2009   FOR GLAUCOMA AND LASER SURGERY  X2  . GASTRIC BYPASS  [id number]  . GLAUCOMA SURGERY  10/31/2009  . HERNIA REPAIR    . HIATAL  HERNIA REPAIR  01/30/2014   Procedure: HERNIA REPAIR HIATAL;  Surgeon: Pedro Earls, MD;  [facility];  Service: General;;  . HYSTEROSCOPY    . IVC FILTER     2010 - THEN REMOVED THEN PLACED AGAIN 2012  . JOINT REPLACEMENT    . KNEE ARTHROSCOPY  12/2007  . LAPAROSCOPIC GASTRIC BANDING N/A 01/30/2014   Procedure: LAPAROSCOPIC GASTRIC BANDING over bypass ;  Surgeon: Pedro Earls, MD;  [facility];  Service: General;  Laterality: N/A;  . POSTERIOR LAMINECTOMY / DECOMPRESSION CERVICAL SPINE  01/26/2011  . TOTAL KNEE ARTHROPLASTY  09/09/2011   Procedure: TOTAL KNEE ARTHROPLASTY;  Surgeon: Johnn Hai, MD;  [facility];  Service: Orthopedics;  Laterality: Left;  . TOTAL KNEE ARTHROPLASTY Right 03/22/2014   Procedure: RIGHT TOTAL KNEE ARTHROPLASTY;  Surgeon: Johnn Hai, MD;  [facility];  Service: Orthopedics;  Laterality: Right;  . UPPER GI ENDOSCOPY N/A 01/30/2014   Procedure: UPPER GI ENDOSCOPY;  Surgeon: Pedro Earls, MD;  [facility];  Service: General;  Laterality: N/A;  . YAG LASER APPLICATION  [id number]   Procedure: YAG LASER APPLICATION;  Surgeon: Myrtha Mantis., MD;  Location: [place];  Service: Ophthalmology;  Laterality: Right;  . YAG LASER APPLICATION  [id number]   Procedure: YAG LASER APPLICATION;  Surgeon: Myrtha Mantis., MD;  Location: [facility] [place];  Service: Ophthalmology;  Laterality: Right;    FAMILY HISTORY Family History  Problem Relation Age of Onset  . Arthritis Mother   . Hypertension Mother   . Diabetes Mother   . Stroke Mother   . Obesity Mother   . Arthritis Father   .  Diabetes Father   . Alcohol abuse Father   . Hypertension Father   . Sudden death Father   . Glaucoma Other   . Cerebral aneurysm Other   . Stroke Other     SOCIAL HISTORY Social History   Tobacco Use  . Smoking status: Never Smoker  . Smokeless tobacco: Never Used  Substance Use Topics  . Alcohol use: No  . Drug use: No         OPHTHALMIC EXAM:  Base Eye Exam    Visual Acuity (Snellen - Linear)      Right Left   Dist [place] 20/80 -1 20/30 +   Dist ph [place] 20/25        Tonometry (Tonopen, 9:28 AM)      Right Left   Pressure 13 12       Pupils      Pupils Dark Light Shape React APD   Right PERRL 5 5 Round Minimal None   Left PERRL 4 4 Round Minimal None       Visual Fields (Counting fingers)      Left Right    Full    Restrictions  Partial outer inferior temporal, inferior nasal deficiencies       Neuro/Psych    Oriented x3: Yes   Mood/Affect: Normal       Dilation    Both eyes: 1.0% Mydriacyl, 2.5% Phenylephrine @ 9:28 AM        Slit Lamp and Fundus Exam    External Exam      Right Left   External Normal Normal       Slit Lamp Exam      Right Left   Lids/Lashes Normal Normal   Conjunctiva/Sclera White and quiet White and quiet   Cornea Clear Clear  Anterior Chamber Deep and quiet Deep and quiet   Iris Round and reactive Round and reactive   Lens Posterior chamber intraocular lens Posterior chamber intraocular lens   Anterior Vitreous Normal Normal       Fundus Exam      Right Left   Posterior Vitreous Normal Vitrectomized   Disc Advanced cupping, still pink Advanced cupping, still pink   C/D Ratio 0.9 0.85   Macula Microaneurysms, no macular thickening Microaneurysms, no macular thickening   Vessels Old central retinal vein occlusion, collateralized Old central retinal vein occlusion, collateralized   Periphery Laser scars Laser scars          IMAGING AND PROCEDURES  Imaging and Procedures for 09/04/19  OCT, Retina - OU - Both  Eyes       Right Eye Quality was good. Scan locations included subfoveal. Central Foveal Thickness: 284. Progression has been stable. Findings include abnormal foveal contour.   Left Eye Quality was good. Scan locations included subfoveal. Central Foveal Thickness: 332. Progression has been stable. Findings include abnormal foveal contour, cystoid macular edema.   Notes Diffuse retinal atrophy macula OD.  OS with small focal region of thickening superior to the fovea, new affecting the center of the vision.  Will observe this and if CME worsens will consider treatment intravitreal                ASSESSMENT/PLAN:  No problem-specific Assessment & Plan notes found for this encounter.      ICD-10-CM   1. 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 Primary open angle glaucoma of both eyes, severe stage  H40.1133   3. Nondiabetic proliferative retinopathy, left  H35.22 OCT, Retina - OU - Both Eyes    1.  Minor CME has developed in the left eye, likely from old central retinal vein occlusion.  2.  Patient uses CPAP nightly.  I explained the importance of this for eye ocular health and optic nerve health  3.  We will follow this new CME  OS carefully, and if it worsens will institute therapy with intravitreal medication  Ophthalmic Meds Ordered this visit:  No orders of the defined types were placed in this encounter.      No follow-ups on file.  There are no Patient Instructions on file for this visit.   Explained the diagnoses, plan, and follow up with the patient and they expressed understanding.  Patient expressed understanding of the importance of proper follow up care.   Clent Demark Lavetta Geier M.D. Diseases & Surgery of the Retina and Vitreous Retina [facility] Diabetic [place] 09/04/19     Abbreviations: M myopia (nearsighted); A astigmatism; H hyperopia (farsighted); P presbyopia; Mrx spectacle  prescription;  CTL contact lenses; OD right eye; OS left eye; OU both eyes  XT exotropia; ET esotropia; PEK punctate epithelial keratitis; PEE punctate epithelial erosions; DES dry eye syndrome; MGD meibomian gland dysfunction; ATs artificial tears; PFAT's preservative free artificial tears; [place] nuclear sclerotic cataract; PSC posterior subcapsular cataract; ERM epi-retinal membrane; PVD posterior vitreous detachment; RD retinal detachment; DM diabetes mellitus; DR diabetic retinopathy; NPDR non-proliferative diabetic retinopathy; PDR proliferative diabetic retinopathy; CSME clinically significant macular edema; DME diabetic macular edema; dbh dot blot hemorrhages; CWS cotton wool spot; POAG primary open angle glaucoma; C/D cup-to-disc ratio; HVF humphrey visual field; GVF goldmann visual field; OCT optical coherence tomography; IOP intraocular pressure; BRVO Branch retinal vein occlusion; CRVO central  retinal vein occlusion; CRAO central retinal artery occlusion; BRAO branch retinal artery occlusion; RT retinal tear; SB scleral buckle; PPV pars plana vitrectomy; VH Vitreous hemorrhage; PRP panretinal laser photocoagulation; IVK intravitreal kenalog; VMT vitreomacular traction; MH Macular hole;  NVD neovascularization of the disc; NVE neovascularization elsewhere; AREDS age related eye disease study; ARMD age related macular degeneration; POAG primary open angle glaucoma; EBMD epithelial/anterior basement membrane dystrophy; ACIOL anterior chamber intraocular lens; IOL intraocular lens; PCIOL posterior chamber intraocular lens; Phaco/IOL phacoemulsification with intraocular lens placement; [place] photorefractive keratectomy; LASIK laser assisted in situ keratomileusis; HTN hypertension; DM diabetes mellitus; COPD chronic obstructive pulmonary disease

## 2019-09-26 ENCOUNTER — Ambulatory Visit: Payer: BC Managed Care – PPO | Attending: General Surgery | Admitting: Physical Therapy

## 2019-09-26 ENCOUNTER — Other Ambulatory Visit: Payer: Self-pay

## 2019-09-26 ENCOUNTER — Encounter: Payer: Self-pay | Admitting: Physical Therapy

## 2019-09-26 DIAGNOSIS — M6281 Muscle weakness (generalized): Secondary | ICD-10-CM | POA: Diagnosis not present

## 2019-09-26 DIAGNOSIS — R279 Unspecified lack of coordination: Secondary | ICD-10-CM | POA: Diagnosis present

## 2019-09-26 NOTE — Therapy (Signed)
[facility] [address], [place], [place], [address] Phone: [phone]   Fax:  [phone]  Physical Therapy Treatment  Patient Details  Name: [name] MRN: [id number] Date of Birth: 07-24-1960 Referring Provider (PT): Leighton Ruff, MD   Encounter Date: 09/26/2019  PT End of Session - 09/26/19 1441    Visit Number  2    Date for PT Re-Evaluation  11/21/19    PT Start Time  1409   arrived late   PT Stop Time  1444    PT Time Calculation (min)  35 min    Activity Tolerance  Patient tolerated treatment well    Behavior During Therapy  [facility] for tasks assessed/performed       Past Medical History:  Diagnosis Date  . Anemia   . Anxiety   . Arthritis    knee   . Asthma   . Back pain   . Depression   . Diabetes ([place])   . DVT (deep venous thrombosis) ([place])   . Dysfunctional uterine bleeding   . Dyspnea   . GERD (gastroesophageal reflux disease)   . Glaucoma   . Headache(784.0)    MIGRAINES IN PAST  . Hypertension   . Hypothyroidism   . Joint pain   . Lactose intolerance   . Leg edema   . Morbid obesity ([place])   . Multiple food allergies   . Optic neuropathy   . Osteoarthritis   . PE (pulmonary embolism)   . Sleep apnea, obstructive    USES C-PAP    Past Surgical History:  Procedure Laterality Date  . CAPSULOTOMY  05/03/2012   Procedure: MINOR CAPSULOTOMY;  Surgeon: Myrtha Mantis., MD;  Location: [place];  Service: Ophthalmology;  Laterality: Right;  . CAPSULOTOMY  05/05/2012   Procedure: MINOR CAPSULOTOMY;  Surgeon: Myrtha Mantis., MD;  Location: [place];  Service: Ophthalmology;  Laterality: Right;  . CATARACT SURG    . DILATION AND CURETTAGE OF UTERUS    . ENDOMETRIAL ABLATION     failure   . EYE SURGERY  11/16/2009   FOR GLAUCOMA AND LASER SURGERY  X2  . GASTRIC BYPASS  [id number]  . GLAUCOMA SURGERY  10/31/2009  . HERNIA REPAIR    . HIATAL HERNIA REPAIR  01/30/2014    Procedure: HERNIA REPAIR HIATAL;  Surgeon: Pedro Earls, MD;  [facility];  Service: General;;  . HYSTEROSCOPY    . IVC FILTER     2010 - THEN REMOVED THEN PLACED AGAIN 2012  . JOINT REPLACEMENT    . KNEE ARTHROSCOPY  12/2007  . LAPAROSCOPIC GASTRIC BANDING N/A 01/30/2014   Procedure: LAPAROSCOPIC GASTRIC BANDING over bypass ;  Surgeon: Pedro Earls, MD;  [facility];  Service: General;  Laterality: N/A;  . POSTERIOR LAMINECTOMY / DECOMPRESSION CERVICAL SPINE  01/26/2011  . TOTAL KNEE ARTHROPLASTY  09/09/2011   Procedure: TOTAL KNEE ARTHROPLASTY;  Surgeon: Johnn Hai, MD;  [facility];  Service: Orthopedics;  Laterality: Left;  . TOTAL KNEE ARTHROPLASTY Right 03/22/2014   Procedure: RIGHT TOTAL KNEE ARTHROPLASTY;  Surgeon: Johnn Hai, MD;  [facility];  Service: Orthopedics;  Laterality: Right;  . UPPER GI ENDOSCOPY N/A 01/30/2014   Procedure: UPPER GI ENDOSCOPY;  Surgeon: Pedro Earls, MD;  [facility];  Service: General;  Laterality: N/A;  . YAG LASER APPLICATION  05/30/4707   Procedure: YAG LASER APPLICATION;  Surgeon: Myrtha Mantis.,  MD;  Location: [place];  Service: Ophthalmology;  Laterality: Right;  . YAG LASER APPLICATION  0/93/2671   Procedure: YAG LASER APPLICATION;  Surgeon: Myrtha Mantis., MD;  Location: [place];  Service: Ophthalmology;  Laterality: Right;    There were no vitals filed for this visit.  Subjective Assessment - 09/26/19 1412    Subjective  Pt states she noticed incontinence when she took the medicine from the doctor.  Pt states she is taking nexium and magnesium.  Pt had 1-2 days last week where she had leakage.  States she thinks it could also be what I eat.  States this time she was able to feel it because she is taking supplement from the health food store                        [facility] Adult PT Treatment/Exercise - 09/26/19 0001      Neuro Re-ed    Neuro Re-ed Details   tactile cues  for pelvic contract; breathing techniques; bulging      Exercises   Exercises  Lumbar      Lumbar Exercises: Seated   Other Seated Lumbar Exercises  ball squeeze with breathing      Lumbar Exercises: Supine   Other Supine Lumbar Exercises  ball squeeze with breathing             PT Education - 09/26/19 1447    Education Details  Access Code: [email or web address]    Person(s) Educated  Patient    Methods  Explanation;Demonstration;Tactile cues;Verbal cues;Handout    Comprehension  Verbalized understanding;Returned demonstration       PT Short Term Goals - 08/30/19 1013      PT SHORT TERM GOAL #1   Title  ind with initial HEP    Time  4    Period  Weeks    Status  New    Target Date  09/26/19        PT Long Term Goals - 08/30/19 1014      PT LONG TERM GOAL #1   Title  Pt will be ind with HEP    Time  8    Period  Weeks    Status  New    Target Date  11/21/19      PT LONG TERM GOAL #2   Title  Pt will report improved sensation and can tell when she is going to have a BM    Time  12    Period  Weeks    Status  New    Target Date  11/21/19      PT LONG TERM GOAL #3   Title  Pt will be able to contract with 3/5 MMT and hold for at least 10 sec for improved bowel control    Baseline  2/5 for 5 sec    Time  12    Period  Weeks    Status  New    Target Date  11/21/19            Plan - 09/26/19 1445    Clinical Impression Statement  Today's session included h/s and hip ROM assessed. Pt has tension in h/s Rt>Lt.  hip flexion ROM is limited with soft end feel.  Pt needed mod/max cues to perform exercises and breathing techniques.  Unable to get bulging today.  Pt was able to perform kegel and diaphragmatic breathing in supine and reported she could feel it in sitting  as well.    PT Treatment/Interventions  ADLs/Self Care Home Management;Aquatic Therapy;Biofeedback;Cryotherapy;Electrical Stimulation;Neuromuscular re-education;Therapeutic exercise;Therapeutic  activities;Patient/family education;Wheelchair mobility training;Passive range of motion;Dry needling;Manual techniques;Taping;Moist Heat    PT Next Visit Plan  f/u with breathing, bulging and toileting techniques; progress core and pelvic floor strength    PT Home Exercise Plan  Access Code: [email or web address]    Consulted and Agree with Plan of Care  Patient       Patient will benefit from skilled therapeutic intervention in order to improve the following deficits and impairments:  Postural dysfunction, Decreased strength, Decreased coordination  Visit Diagnosis: Muscle weakness (generalized)  Unspecified lack of coordination     Problem List Patient Active Problem List   Diagnosis Date Noted  . 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 Primary open angle glaucoma of both eyes, severe stage 09/04/2019  . Nondiabetic proliferative retinopathy, left 09/04/2019  . Shortness of breath on exertion 11/04/2017  . Type 2 diabetes mellitus without complication, without long-term current use of insulin ([place]) 11/04/2017  . Essential hypertension 11/04/2017  . Other headache syndrome 08/30/2014  . Speech disturbance 08/30/2014  . Vertigo 08/30/2014  . Right knee DJD 03/22/2014  . 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 Anemia 09/10/2011  . ARF (acute renal failure) ([place]) 09/10/2011  . Paresthesias 09/10/2011  . Total knee replacement status, left 09/10/2011  . Cervical spondylosis with myelopathy 06/30/2011  . Osteoarthritis of both knees 06/30/2011  . Diabetes mellitus   . Arthritis   . Sleep apnea, obstructive   . Morbid obesity ([place])   . Dysfunctional uterine bleeding   . HEPATITIS C 01/25/2008  . HYPERTENSION 01/25/2008  . 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 ASTHMA 01/25/2008  . SLEEP APNEA 01/25/2008  . DYSPNEA 01/25/2008    Jule Ser, PT 09/26/2019, 4:21 PM  [facility] [address] [place], [place], [address] Phone: [phone]   Fax:  [phone]  Name: [name] MRN: [id number] Date of Birth: 02-25-1961

## 2019-09-26 NOTE — Patient Instructions (Signed)
Access Code: [email or web address] URL: [email or web address] Date: 09/26/2019 Prepared by: Jari Favre  Exercises Supine Hip Adductor Squeeze with Small Ball - 3 x daily - 7 x weekly - 1 sets - 10 reps - 2 sec hold Supine Diaphragmatic Breathing - 3 x daily - 7 x weekly - 10 reps - 1 sets Seated Pelvic Floor Contraction with Isometric Hip Adduction - 3 x daily - 7 x weekly - 1 sets - 10 reps - 2 sechold, 3 sec rest hold

## 2019-09-28 ENCOUNTER — Other Ambulatory Visit: Payer: Self-pay

## 2019-09-28 ENCOUNTER — Ambulatory Visit: Payer: BC Managed Care – PPO | Admitting: Allergy

## 2019-09-28 ENCOUNTER — Encounter: Payer: Self-pay | Admitting: Allergy

## 2019-09-28 VITALS — BP 110/82 | HR 112 | Temp 98.5°F | Ht 64.0 in | Wt 282.0 lb

## 2019-09-28 DIAGNOSIS — J4540 Moderate persistent asthma, uncomplicated: Secondary | ICD-10-CM | POA: Diagnosis not present

## 2019-09-28 DIAGNOSIS — K219 Gastro-esophageal reflux disease without esophagitis: Secondary | ICD-10-CM | POA: Diagnosis not present

## 2019-09-28 DIAGNOSIS — J3089 Other allergic rhinitis: Secondary | ICD-10-CM

## 2019-09-28 MED ORDER — AZELASTINE HCL 0.1 % NA SOLN: 2.0000 | Freq: Two times a day (BID) | NASAL | 5 refills | Status: DC | PRN

## 2019-09-28 NOTE — Patient Instructions (Addendum)
Asthma - lung function testing today is slightly lower than expected for your age and sex.  No significant improvement after Xopenex use in office - at this time would trial Trelegy 275mcg 1 inhalation once a day.  This is a triple therapy inhaler that is a step-up from Symbicort and Advair.  Take this until your appointment with Dr. Elsworth Soho, pulmonologist - continue Singulair 10mg  daily - have access to Xopenex inhaler 2 puffs every 4-6 hours as needed for cough/wheeze/shortness of breath/chest tightness.  May use 15-20 minutes prior to activity.   Monitor frequency of use.   - will request lab records from Dr. Criss Rosales  Asthma control goals:   Full participation in all desired activities (may need albuterol before activity)  Albuterol use two time or less a week on average (not counting use with activity)  Cough interfering with sleep two time or less a month  Oral steroids no more than once a year  No hospitalizations  Allergies  - continue your allergen avoidance measures  - continue use of Fexofenadine 180mg  daily as needed  - singulair as above helps also with allergy symptom control  - for itchy/watery eyes can continue use of as needed Pazeo or Pataday  - for nasal congestion/drainage can continue use of Nasonex 1-2 sprays each nostril daily as needed  - right now with more nasal drainage would take nasal antihistamine, Astelin 1-2 sprays each nostril 1-2 times a day for symptom control  Reflux  - reflux symptoms can be triggers of asthma symptoms  - you have had adverse symptoms with Famotidine use  - proton pump inhibitors like Nexium, Prilosec, Dexilant are meant for short-term use which can still be beneficial short-term  Follow-up 3-4 months or sooner if needed

## 2019-09-28 NOTE — Progress Notes (Signed)
New Patient Note  RE: [name] MRN: [id number] DOB: 05-Jul-1960 Date of Office Visit: 09/28/2019  Referring provider: Lucianne Lei, MD Primary care provider: Lucianne Lei, MD   Chief Complaint: asthma and allergies  History of present illness: [name] is a 59 y.o. female presenting today for consultation for asthma and allergies.    She has history of asthma, adult onset.  She states she was diagnosed with asthma after she had a second PE.  She states her asthma symptoms include wheezing, coughing, chest tightness and shortness.  She does feel that reflux may be a trigger.  She also states her office building is triggering symptoms as well (it is a newer building however she states there is mold).   The wheezing is what is bothering her the most.   She states she has been having issues with her asthma since March 2020 where she reports worsening symptoms every 3 months.  She is on Symbicort 2 puff twice a day.   She was on Advair prior to Symbicort.  She states her allergist (Dr. Fredderick Phenix) put her back on Advair and when she went back to her PCP she was changed back to Symbicort.  She does feel the Symbicort works better that Nebo.  She states the xopenex in nebulizer form makes her jittery but doesn't do that via the inhaler.  She reports using Xopenex about 3 times a month on eczema.  She has been on singulair for years.   She had a recent UC/ED visit on 08/17/19 by asthma exacerbation.    She has had allergy testing by Dr. Velora Heckler when he was still practicing.  She states she was on allergy shots but states she "felt bad" in between her shots.  She was on shots for at least 6 months before stopping.   She states she had a previous round of allergy shots that she did for about a year.  She states she is allergic to "everything".    She states she loved Xyzal for allergy control.  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  She follows with ophthalmology. She is taking fexofenadine.  She does report current nasal drainage.  She has nasonex but hasn't used in a long time.  She has a pulmonology appointment with Dr. Elsworth Soho on 10/03/19.    She is in PT for bowel incontinence.  She states use of famotidine made her stool looser.  She was told that PPI were only to used for 7-14 days depending on the dosage.    Review of systems: Review of Systems  Constitutional: Negative.   HENT:       See HPI  Eyes: Negative.   Respiratory:       See HPI  Cardiovascular: Negative.   Gastrointestinal:       See HPI  Musculoskeletal: Negative.   Skin: Negative.   Neurological: Negative.     All other systems negative unless noted above in HPI  Past medical history: Past Medical History:  Diagnosis Date  . Anemia   . Anxiety   . Arthritis    knee   . Asthma   . Back pain   . Depression   . Diabetes ([place])   . DVT (deep venous thrombosis) ([place])   . Dysfunctional uterine bleeding   . Dyspnea   . GERD (gastroesophageal reflux disease)   . Glaucoma   . Headache(784.0)  MIGRAINES IN PAST  . Hypertension   . Hypothyroidism   . Joint pain   . Lactose intolerance   . Leg edema   . Morbid obesity ([place])   . Multiple food allergies   . Optic neuropathy   . Osteoarthritis   . PE (pulmonary embolism)   . Sleep apnea, obstructive    USES C-PAP    Past surgical history: Past Surgical History:  Procedure Laterality Date  . CAPSULOTOMY  05/03/2012   Procedure: MINOR CAPSULOTOMY;  Surgeon: Myrtha Mantis., MD;  Location: [place];  Service: Ophthalmology;  Laterality: Right;  . CAPSULOTOMY  05/05/2012   Procedure: MINOR CAPSULOTOMY;  Surgeon: Myrtha Mantis., MD;  Location: [place];  Service: Ophthalmology;  Laterality: Right;  . CATARACT SURG    . DILATION AND CURETTAGE OF UTERUS    . ENDOMETRIAL ABLATION     failure   . EYE SURGERY  11/16/2009   FOR GLAUCOMA AND LASER SURGERY  X2    . GASTRIC BYPASS  [id number]  . GLAUCOMA SURGERY  10/31/2009  . HERNIA REPAIR    . HIATAL HERNIA REPAIR  01/30/2014   Procedure: HERNIA REPAIR HIATAL;  Surgeon: Pedro Earls, MD;  [facility];  Service: General;;  . HYSTEROSCOPY    . IVC FILTER     2010 - THEN REMOVED THEN PLACED AGAIN 2012  . JOINT REPLACEMENT    . KNEE ARTHROSCOPY  12/2007  . LAPAROSCOPIC GASTRIC BANDING N/A 01/30/2014   Procedure: LAPAROSCOPIC GASTRIC BANDING over bypass ;  Surgeon: Pedro Earls, MD;  [facility];  Service: General;  Laterality: N/A;  . POSTERIOR LAMINECTOMY / DECOMPRESSION CERVICAL SPINE  01/26/2011  . TOTAL KNEE ARTHROPLASTY  09/09/2011   Procedure: TOTAL KNEE ARTHROPLASTY;  Surgeon: Johnn Hai, MD;  [facility];  Service: Orthopedics;  Laterality: Left;  . TOTAL KNEE ARTHROPLASTY Right 03/22/2014   Procedure: RIGHT TOTAL KNEE ARTHROPLASTY;  Surgeon: Johnn Hai, MD;  [facility];  Service: Orthopedics;  Laterality: Right;  . UPPER GI ENDOSCOPY N/A 01/30/2014   Procedure: UPPER GI ENDOSCOPY;  Surgeon: Pedro Earls, MD;  [facility];  Service: General;  Laterality: N/A;  . YAG LASER APPLICATION  12/22/90   Procedure: YAG LASER APPLICATION;  Surgeon: Myrtha Mantis., MD;  Location: [place];  Service: Ophthalmology;  Laterality: Right;  . YAG LASER APPLICATION  07/17/760   Procedure: YAG LASER APPLICATION;  Surgeon: Myrtha Mantis., MD;  Location: [place];  Service: Ophthalmology;  Laterality: Right;    Family history:  Family History  Problem Relation Age of Onset  . Arthritis Mother   . Hypertension Mother   . Diabetes Mother   . Stroke Mother   . Obesity Mother   . Arthritis Father   . Diabetes Father   . Alcohol abuse Father   . Hypertension Father   . Sudden death Father   . Glaucoma Other   . Cerebral aneurysm Other   . Stroke Other    social history: She lives in a home without carpeting with electric heating and central  cooling.  No pets in the home.  No concern for water damage, mildew or roaches in the home.  She is a [employer] of [employer] and is also a math professor.  She denies a smoking history   Medication List: Current Outpatient Medications  Medication Sig Dispense Refill  . acetaminophen (TYLENOL) 500 MG tablet Take 1,000  mg by mouth every 4 (four) hours as needed for moderate pain.    Marland Kitchen ALPRAZolam (XANAX) 0.5 MG tablet Take by mouth.    Marland Kitchen b complex vitamins tablet Take 1 tablet by mouth every morning.     . bimatoprost (LUMIGAN) 0.01 % SOLN 1 drop at bedtime.    . brinzolamide (AZOPT) 1 % ophthalmic suspension Place 1 drop into both eyes 2 (two) times daily.     . budesonide-formoterol (SYMBICORT) 160-4.5 MCG/ACT inhaler Inhale 2 puffs into the lungs 2 (two) times daily.    . Calcium Carbonate-Vit D-Min (CALTRATE 600+D PLUS MINERALS PO) Take 1 tablet by mouth 3 (three) times daily.     . COMBIGAN 0.2-0.5 % ophthalmic solution Apply 1 drop to eye 3 (three) times daily.    . Cyanocobalamin (VITAMIN B-12) 2500 MCG SUBL Place 2 tablets under the tongue at bedtime.    . cyclobenzaprine (FLEXERIL) 10 MG tablet Take 10 mg by mouth at bedtime.     Marland Kitchen diltiazem (DILACOR XR) 240 MG 24 hr capsule Take 240 mg by mouth at bedtime.    . Docosahexaenoic Acid (DHA PO) Take 1 tablet by mouth 2 (two) times daily.    . famotidine (PEPCID) 20 MG tablet Take 20 mg by mouth 2 (two) times daily.    . fexofenadine (ALLEGRA) 180 MG tablet Take 1 tablet by mouth daily.    . furosemide (LASIX) 40 MG tablet Take 40 mg by mouth.    Marland Kitchen ipratropium (ATROVENT HFA) 17 MCG/ACT inhaler Inhale 2 puffs into the lungs 4 (four) times daily. 1 Inhaler 0  . magnesium chloride (SLOW-MAG) 64 MG TBEC Take 1-2 tablets by mouth 2 (two) times daily. Patient takes 2 tablet every morning and 1 tablet with dinner    . mometasone (NASONEX) 50 MCG/ACT nasal spray Place 2 sprays into the nose daily as needed (allergies).       . montelukast (SINGULAIR) 10 MG tablet Take 10 mg by mouth at bedtime.    . Multiple Vitamin (MULTIVITAMIN) tablet Take 1 tablet by mouth 2 (two) times daily.     . Olopatadine HCl (PATADAY) 0.2 % SOLN Apply 1 drop to eye 2 (two) times daily as needed.    . potassium chloride SA (K-DUR,KLOR-CON) 20 MEQ tablet Take 20-40 mEq by mouth 2 (two) times daily. 40 meq with breakfast and 20 meq with dinner    . prednisoLONE acetate (PRED FORTE) 1 % ophthalmic suspension Place 1 drop into the left eye 4 (four) times daily.    Marland Kitchen Propylene Glycol (SYSTANE BALANCE) 0.6 % SOLN Apply to eye.    . pyridOXINE (VITAMIN B-6) 100 MG tablet Take 100 mg by mouth every morning.     . quiNINE (QUALAQUIN) 324 MG capsule Take 324 mg by mouth at bedtime.     . RHOPRESSA 0.02 % SOLN Place 1 drop into the right eye in the morning.    . Rivaroxaban (XARELTO) 20 MG TABS Take 20 mg by mouth at bedtime.     . saccharomyces boulardii (FLORASTOR) 250 MG capsule Take by mouth 3 (three) times daily.    . Sodium Chloride-Sodium Bicarb (NETI POT SINUS WASH NA) Place into the nose 2 (two) times daily as needed.    . valsartan-hydrochlorothiazide (DIOVAN-HCT) 80-12.5 MG per tablet Take 1 tablet by mouth every morning.     . Vitamin D, Ergocalciferol, (DRISDOL) 50000 UNITS CAPS capsule Take 50,000 Units by mouth every 7 (seven) days. Patient takes on Saturday    .  Ascorbic Acid (VITAMIN C) 1000 MG tablet Take 1,000 mg by mouth every morning.  (Patient not taking: Reported on 09/28/2019)    . brimonidine (ALPHAGAN) 0.15 % ophthalmic solution Place 1 drop into both eyes 2 (two) times daily.  (Patient not taking: Reported on 09/28/2019)    . CELEBREX 100 MG capsule  (Patient not taking: Reported on 09/28/2019)    . fish oil-omega-3 fatty acids 1000 MG capsule Take 1 g by mouth 2 (two) times daily.  (Patient not taking: Reported on 09/28/2019)    . guaiFENesin (MUCINEX) 600 MG 12 hr tablet Take by mouth 2 (two) times daily.    Marland Kitchen levalbuterol  (XOPENEX) 1.25 MG/3ML nebulizer solution Take 1.25 mg by nebulization every 6 (six) hours as needed for wheezing or shortness of breath.     . levocetirizine (XYZAL) 2.5 MG/5ML solution Take 2.5 mg by mouth every evening. (Patient not taking: Reported on 09/28/2019)    . niacin 500 MG tablet Take 500 mg by mouth daily with breakfast. Needs to be flush free per patient (Patient not taking: Reported on 09/28/2019)    . PAZEO 0.7 % SOLN  (Patient not taking: Reported on 09/28/2019)    . sodium chloride (OCEAN) 0.65 % SOLN nasal spray Place 2 sprays into both nostrils 2 (two) times daily. (Patient not taking: Reported on 09/28/2019)     No current facility-administered medications for this visit.    Known medication allergies: Allergies  Allergen Reactions  . Beta Adrenergic Blockers Other (See Comments)    RED EYES AND CONGESTION  . Hydrocodone   . Penicillins Hives, Itching and Swelling  . Sulfa Antibiotics Hives    RED EYES  . Thimerosal Hives and Itching     Physical examination: Blood pressure 110/82, pulse (!) 112, temperature 98.5 F (36.9 C), temperature source Oral, height 5\' 4"  (1.626 m), weight 282 lb (127.9 kg), last menstrual period 07/26/2011, SpO2 90 %.  General: Alert, interactive, in no acute distress. HEENT: PERRLA, TMs pearly gray, turbinates minimally edematous without discharge, post-pharynx non erythematous. Neck: Supple without lymphadenopathy. Lungs: Clear to auscultation without wheezing, rhonchi or rales. {no increased work of breathing. CV: Normal S1, S2 without murmurs. Abdomen: Nondistended, nontender. Skin: Warm and dry, without lesions or rashes. Extremities:  No clubbing, cyanosis or edema. Neuro:   Grossly intact.  Diagnositics/Labs: Labs/Imaging: CXR 08/17/19 - minimal bronchitic changes and left basilar atelectasis.  Spirometry: FEV1: 1.37L 65%, FVC: 1.73L 65% predicted.  Status post Xopenex he did not have any improvement in her FEV1.  Assessment  and plan:   Asthma - lung function testing today is slightly lower than expected for your age and sex.  No significant improvement after Xopenex use in office - at this time would trial Trelegy 245mcg 1 inhalation once a day.  This is a triple therapy inhaler that is a step-up from Symbicort and Advair.  Take this until your appointment with Dr. Elsworth Soho, pulmonologist - continue Singulair 10mg  daily - have access to Xopenex inhaler 2 puffs every 4-6 hours as needed for cough/wheeze/shortness of breath/chest tightness.  May use 15-20 minutes prior to activity.   Monitor frequency of use.   - will request lab records from Dr. Criss Rosales  Asthma control goals:   Full participation in all desired activities (may need albuterol before activity)  Albuterol use two time or less a week on average (not counting use with activity)  Cough interfering with sleep two time or less a month  Oral steroids no more  than once a year  No hospitalizations  Allergic rhinitis  - continue your allergen avoidance measures  - continue use of Fexofenadine 180mg  daily as needed  - singulair as above helps also with allergy symptom control  - for itchy/watery eyes can continue use of as needed Pazeo or Pataday  - for nasal congestion/drainage can continue use of Nasonex 1-2 sprays each nostril daily as needed  - right now with more nasal drainage would take nasal antihistamine, Astelin 1-2 sprays each nostril 1-2 times a day for symptom control  Reflux (LPRD)  - reflux symptoms can be triggers of asthma symptoms  - you have had adverse symptoms with Famotidine use  - proton pump inhibitors like Nexium, Prilosec, Dexilant are meant for short-term use which can still be beneficial short-term  Follow-up 3-4 months or sooner if needed  I appreciate the opportunity to take part in Khylah's care. Please do not hesitate to contact me with questions.  Sincerely,   Prudy Feeler, MD Allergy/Immunology [facility]  [place] [facility] [place]

## 2019-10-03 ENCOUNTER — Encounter: Payer: Self-pay | Admitting: Physical Therapy

## 2019-10-03 ENCOUNTER — Encounter: Payer: Self-pay | Admitting: Pulmonary Disease

## 2019-10-03 ENCOUNTER — Ambulatory Visit: Payer: BC Managed Care – PPO | Admitting: Pulmonary Disease

## 2019-10-03 ENCOUNTER — Ambulatory Visit: Payer: BC Managed Care – PPO | Admitting: Physical Therapy

## 2019-10-03 ENCOUNTER — Other Ambulatory Visit: Payer: Self-pay

## 2019-10-03 DIAGNOSIS — R279 Unspecified lack of coordination: Secondary | ICD-10-CM

## 2019-10-03 DIAGNOSIS — J4540 Moderate persistent asthma, uncomplicated: Secondary | ICD-10-CM

## 2019-10-03 DIAGNOSIS — M6281 Muscle weakness (generalized): Secondary | ICD-10-CM

## 2019-10-03 DIAGNOSIS — J45998 Other asthma: Secondary | ICD-10-CM | POA: Diagnosis not present

## 2019-10-03 DIAGNOSIS — K219 Gastro-esophageal reflux disease without esophagitis: Secondary | ICD-10-CM | POA: Diagnosis not present

## 2019-10-03 MED ORDER — TRELEGY ELLIPTA 200-62.5-25 MCG/INH IN AEPB
1.0000 | INHALATION_SPRAY | Freq: Every day | RESPIRATORY_TRACT | 3 refills | Status: DC
Start: 2019-10-03 — End: 2019-12-29

## 2019-10-03 NOTE — Progress Notes (Signed)
Subjective:    Patient ID: [name], female    DOB: 25-Jan-1961, 59 y.o.   MRN: [id number]  HPI  59 year old never smoker presents for evaluation of asthma. She reports diagnosis of asthma around age 44 when she had an episode of VTE. She denies childhood history of asthma. Since then she has undergone allergy evaluation by Dr. Eli Hose and was on allergy shots for a period of time until she discontinued. Triggers include weather changes especially spring and fall , allergies and GERD.  She had worsening asthma symptoms for 6 months until she had a urgent care visit 07/2019, wheezing improved with prednisone, it was felt that GERD was increasing her symptoms and she was advised to see pulmonologist. She has also transferred her care to another allergist Dr. Nelva Bush whom she saw last week -office visit was reviewed, given Trelegy instead of Symbicort  Medications-Xyzal caused increase glaucoma, fexofenadine works better , she has tried Advair initially and then Symbicort which works better. Last week was changed to Trelegy. Uses Xopenex MDI for breakthrough and albuterol nebs as needed. 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 Anemia   . Anxiety   . Arthritis    knee   . Asthma   . Back pain   . Depression   . Diabetes  ([place])   . DVT (deep venous thrombosis) ([place])   . Dysfunctional uterine bleeding   . Dyspnea   . GERD (gastroesophageal reflux disease)   . Glaucoma   . Headache(784.0)    MIGRAINES IN PAST  . Hypertension   . Hypothyroidism   . Joint pain   . Lactose intolerance   . Leg edema   . Morbid obesity ([place])   . Multiple food allergies   . Optic neuropathy   . Osteoarthritis   . PE (pulmonary embolism)   . Sleep apnea, obstructive    USES C-PAP    Past Surgical History:  Procedure Laterality Date  . CAPSULOTOMY  05/03/2012   Procedure: MINOR CAPSULOTOMY;  Surgeon: Myrtha Mantis., MD;  Location: [place];  Service: Ophthalmology;  Laterality: Right;  . CAPSULOTOMY  05/05/2012   Procedure: MINOR CAPSULOTOMY;  Surgeon: Myrtha Mantis., MD;  Location: [place];  Service: Ophthalmology;  Laterality: Right;  . CATARACT SURG    . DILATION AND CURETTAGE OF UTERUS    . ENDOMETRIAL ABLATION     failure   . EYE SURGERY  11/16/2009   FOR GLAUCOMA AND LASER SURGERY  X2  . GASTRIC BYPASS  [id number]  . GLAUCOMA SURGERY  10/31/2009  . HERNIA REPAIR    . HIATAL HERNIA REPAIR  01/30/2014   Procedure: HERNIA REPAIR HIATAL;  Surgeon: Pedro Earls, MD;  Location[facility];  Service: General;;  . HYSTEROSCOPY    . IVC FILTER     2010 - THEN REMOVED THEN PLACED AGAIN 2012  .  JOINT REPLACEMENT    . KNEE ARTHROSCOPY  12/2007  . LAPAROSCOPIC GASTRIC BANDING N/A 01/30/2014   Procedure: LAPAROSCOPIC GASTRIC BANDING over bypass ;  Surgeon: Pedro Earls, MD;  [facility];  Service: General;  Laterality: N/A;  . POSTERIOR LAMINECTOMY / DECOMPRESSION CERVICAL SPINE  01/26/2011  . TOTAL KNEE ARTHROPLASTY  09/09/2011   Procedure: TOTAL KNEE ARTHROPLASTY;  Surgeon: Johnn Hai, MD;  [facility];  Service: Orthopedics;  Laterality: Left;  . TOTAL KNEE ARTHROPLASTY Right 03/22/2014   Procedure: RIGHT TOTAL KNEE ARTHROPLASTY;  Surgeon: Johnn Hai, MD;  [facility];  Service:  Orthopedics;  Laterality: Right;  . UPPER GI ENDOSCOPY N/A 01/30/2014   Procedure: UPPER GI ENDOSCOPY;  Surgeon: Pedro Earls, MD;  [facility];  Service: General;  Laterality: N/A;  . YAG LASER APPLICATION  6/31/4970   Procedure: YAG LASER APPLICATION;  Surgeon: Myrtha Mantis., MD;  Location: [place];  Service: Ophthalmology;  Laterality: Right;  . YAG LASER APPLICATION  2/63/7858   Procedure: YAG LASER APPLICATION;  Surgeon: Myrtha Mantis., MD;  Location: [place];  Service: Ophthalmology;  Laterality: Right;    Allergies  Allergen Reactions  . Beta Adrenergic Blockers Other (See Comments)    RED EYES AND CONGESTION  . Hydrocodone   . Penicillins Hives, Itching and Swelling  . Sulfa Antibiotics Hives    RED EYES  . Thimerosal Hives and Itching    Social History   Socioeconomic History  . Marital status: Single    Spouse name: Not on file  . Number of children: Not on file  . Years of education: Not on file  . Highest education level: Not on file  Occupational History  . Not on file  Tobacco Use  . Smoking status: Never Smoker  . Smokeless tobacco: Never Used  Vaping Use  . Vaping Use: Never used  Substance and Sexual Activity  . Alcohol use: No  . Drug use: No  . Sexual activity: Not on file  Other Topics Concern  . Not on file  Social History Narrative  . Not on file   Social Determinants of Health   Financial Resource Strain:   . Difficulty of Paying Living Expenses:   Food Insecurity:   . Worried About Charity fundraiser in the Last Year:   . Arboriculturist in the Last Year:   Transportation Needs:   . Film/video editor (Medical):   Marland Kitchen Lack of Transportation (Non-Medical):   Physical Activity:   . Days of Exercise per Week:   . Minutes of Exercise per Session:   Stress:   . Feeling of Stress :   Social Connections:   . Frequency of Communication with Friends and Family:   . Frequency of Social Gatherings with Friends and  Family:   . Attends Religious Services:   . Active Member of Clubs or Organizations:   . Attends Archivist Meetings:   Marland Kitchen Marital Status:   Intimate Partner Violence:   . Fear of Current or Ex-Partner:   . Emotionally Abused:   Marland Kitchen Physically Abused:   . Sexually Abused:      Family History  Problem Relation Age of Onset  . Arthritis Mother   . Hypertension Mother   . Diabetes Mother   . Stroke Mother   . Obesity Mother   . Arthritis Father   . Diabetes Father   . Alcohol abuse Father   .  Hypertension Father   . Sudden death Father   . Glaucoma Other   . Cerebral aneurysm Other   . Stroke Other       Review of Systems neg for any significant sore throat, dysphagia, itching, sneezing, nasal congestion or excess/ purulent secretions, fever, chills, sweats, unintended wt loss, pleuritic or exertional cp, hempoptysis, orthopnea pnd or change in chronic leg swelling. Also denies presyncope, palpitations, heartburn, abdominal pain, nausea, vomiting, diarrhea or change in bowel or urinary habits, dysuria,hematuria, rash, arthralgias, visual complaints, headache, numbness weakness or ataxia.     Objective:   Physical Exam  Gen. Pleasant, obese, in no distress, normal affect ENT - no pallor,icterus, no post nasal drip, class 2-3 airway Neck: No JVD, no thyromegaly, no carotid bruits Lungs: no use of accessory muscles, no dullness to percussion, decreased without rales or rhonchi  Cardiovascular: Rhythm regular, heart sounds  normal, no murmurs or gallops, no peripheral edema Abdomen: soft and non-tender, no hepatosplenomegaly, BS normal. Musculoskeletal: No deformities, no cyanosis or clubbing Neuro:  alert, non focal, no tremors       Assessment & Plan:

## 2019-10-03 NOTE — Assessment & Plan Note (Signed)
Prescription for Trelegy-once daily for 3 months, rinse mouth after use Use Xopenex on an as-needed basis  Continue fexofenadine for allergies  For reflux, trial of omeprazole 20 mg daily OTC Small meals, at least 2 to 3 hours between last meal and bedtime Check with Dr. Hassell Done if gastric band is in position  At least 4 to 6 hours every night on your CPAP machine

## 2019-10-03 NOTE — Therapy (Signed)
[facility] [address] [place], [place], [address] Phone: [phone]   Fax:  [phone]  Physical Therapy Treatment  Patient Details  Name: [name] MRN: [id number] Date of Birth: 1960/06/11 Referring Provider (PT): Leighton Ruff, MD   Encounter Date: 10/03/2019   PT End of Session - 10/03/19 0946    Visit Number 3    Date for PT Re-Evaluation 11/21/19    PT Start Time 2409   arrived late   PT Stop Time 1016    PT Time Calculation (min) 35 min    Activity Tolerance Patient tolerated treatment well    Behavior During Therapy [facility] for tasks assessed/performed           Past Medical History:  Diagnosis Date  . Anemia   . Anxiety   . Arthritis    knee   . Asthma   . Back pain   . Depression   . Diabetes ([place])   . DVT (deep venous thrombosis) ([place])   . Dysfunctional uterine bleeding   . Dyspnea   . GERD (gastroesophageal reflux disease)   . Glaucoma   . Headache(784.0)    MIGRAINES IN PAST  . Hypertension   . Hypothyroidism   . Joint pain   . Lactose intolerance   . Leg edema   . Morbid obesity ([place])   . Multiple food allergies   . Optic neuropathy   . Osteoarthritis   . PE (pulmonary embolism)   . Sleep apnea, obstructive    USES C-PAP    Past Surgical History:  Procedure Laterality Date  . CAPSULOTOMY  05/03/2012   Procedure: MINOR CAPSULOTOMY;  Surgeon: Myrtha Mantis., MD;  Location: [place];  Service: Ophthalmology;  Laterality: Right;  . CAPSULOTOMY  05/05/2012   Procedure: MINOR CAPSULOTOMY;  Surgeon: Myrtha Mantis., MD;  Location: [place];  Service: Ophthalmology;  Laterality: Right;  . CATARACT SURG    . DILATION AND CURETTAGE OF UTERUS    . ENDOMETRIAL ABLATION     failure   . EYE SURGERY  11/16/2009   FOR GLAUCOMA AND LASER SURGERY  X2  . GASTRIC BYPASS  [id number]  . GLAUCOMA SURGERY  10/31/2009  . HERNIA REPAIR    . HIATAL HERNIA REPAIR  01/30/2014    Procedure: HERNIA REPAIR HIATAL;  Surgeon: Pedro Earls, MD;  [facility];  Service: General;;  . HYSTEROSCOPY    . IVC FILTER     2010 - THEN REMOVED THEN PLACED AGAIN 2012  . JOINT REPLACEMENT    . KNEE ARTHROSCOPY  12/2007  . LAPAROSCOPIC GASTRIC BANDING N/A 01/30/2014   Procedure: LAPAROSCOPIC GASTRIC BANDING over bypass ;  Surgeon: Pedro Earls, MD;  [facility];  Service: General;  Laterality: N/A;  . POSTERIOR LAMINECTOMY / DECOMPRESSION CERVICAL SPINE  01/26/2011  . TOTAL KNEE ARTHROPLASTY  09/09/2011   Procedure: TOTAL KNEE ARTHROPLASTY;  Surgeon: Johnn Hai, MD;  [facility];  Service: Orthopedics;  Laterality: Left;  . TOTAL KNEE ARTHROPLASTY Right 03/22/2014   Procedure: RIGHT TOTAL KNEE ARTHROPLASTY;  Surgeon: Johnn Hai, MD;  [facility];  Service: Orthopedics;  Laterality: Right;  . UPPER GI ENDOSCOPY N/A 01/30/2014   Procedure: UPPER GI ENDOSCOPY;  Surgeon: Pedro Earls, MD;  [facility];  Service: General;  Laterality: N/A;  . YAG LASER APPLICATION  08/14/8339   Procedure: YAG LASER APPLICATION;  Surgeon: Myrtha Mantis., MD;  Location: [place];  Service: Ophthalmology;  Laterality: Right;  . YAG LASER APPLICATION  08/11/5359   Procedure: YAG LASER APPLICATION;  Surgeon: Myrtha Mantis., MD;  Location: [place];  Service: Ophthalmology;  Laterality: Right;    There were no vitals filed for this visit.   Subjective Assessment - 10/03/19 0944    Subjective I had 2 BM every time I woke up to pee after I came here.  Pt states after that is better.  Then a little incontinence after having broccoli on Friday    Patient Stated Goals not have the leakage    Currently in Pain? No/denies                             [facility] Adult PT Treatment/Exercise - 10/03/19 0001      Neuro Re-ed    Neuro Re-ed Details  tactile cues for pelvic contract; breathing techniques for blow as you go      Lumbar Exercises:  Supine   Pelvic Tilt 10 reps   kegel and exhale; needs rest breaks to reset between reps   Clam 10 reps;2 seconds   red band; exhale with exertion     Manual Therapy   Manual Therapy Myofascial release    Myofascial Release abdominal fascial release along colon and rectum midline abdomen                    PT Short Term Goals - 08/30/19 1013      PT SHORT TERM GOAL #1   Title ind with initial HEP    Time 4    Period Weeks    Status New    Target Date 09/26/19             PT Long Term Goals - 08/30/19 1014      PT LONG TERM GOAL #1   Title Pt will be ind with HEP    Time 8    Period Weeks    Status New    Target Date 11/21/19      PT LONG TERM GOAL #2   Title Pt will report improved sensation and can tell when she is going to have a BM    Time 12    Period Weeks    Status New    Target Date 11/21/19      PT LONG TERM GOAL #3   Title Pt will be able to contract with 3/5 MMT and hold for at least 10 sec for improved bowel control    Baseline 2/5 for 5 sec    Time 12    Period Weeks    Status New    Target Date 11/21/19                 Plan - 10/03/19 1027    Clinical Impression Statement Pt did well with adding strength and was able to gain greater understanding of how to contract without bulging by using the exhale.  pt overall felt like she could tell when she was going to have a BM better this week.  Pt was educated in possible issues with wearing the girdle due to increased pressure down on pelvic floor. She seems to be unwilling to get rid of it but open to going without it of she is having a day with more loose stools.  Pt will benefit from skilled PT to continue to work on core stability and  pelvic floor muscle function    PT Treatment/Interventions ADLs/Self Care Home Management;Aquatic Therapy;Biofeedback;Cryotherapy;Electrical Stimulation;Neuromuscular re-education;Therapeutic exercise;Therapeutic activities;Patient/family  education;Wheelchair mobility training;Passive range of motion;Dry needling;Manual techniques;Taping;Moist Heat    PT Next Visit Plan f/u with breathing, bulging and toileting techniques; progress core and pelvic floor strength    PT Home Exercise Plan Access Code: [email or web address]    Consulted and Agree with Plan of Care Patient           Patient will benefit from skilled therapeutic intervention in order to improve the following deficits and impairments:  Postural dysfunction, Decreased strength, Decreased coordination  Visit Diagnosis: Muscle weakness (generalized)  Unspecified lack of coordination     Problem List Patient Active Problem List   Diagnosis Date Noted  . 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 Primary open angle glaucoma of both eyes, severe stage 09/04/2019  . Nondiabetic proliferative retinopathy, left 09/04/2019  . Shortness of breath on exertion 11/04/2017  . Type 2 diabetes mellitus without complication, without long-term current use of insulin ([place]) 11/04/2017  . Essential hypertension 11/04/2017  . Other headache syndrome 08/30/2014  . Speech disturbance 08/30/2014  . Vertigo 08/30/2014  . Right knee DJD 03/22/2014  . 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 Anemia 09/10/2011  . ARF (acute renal failure) ([place]) 09/10/2011  . Paresthesias 09/10/2011  . Total knee replacement status, left 09/10/2011  . Cervical spondylosis with myelopathy 06/30/2011  . Osteoarthritis of both knees 06/30/2011  . Diabetes mellitus   . Arthritis   . Sleep apnea, obstructive   . Morbid obesity ([place])   . Dysfunctional uterine bleeding   . HEPATITIS C 01/25/2008  . HYPERTENSION 01/25/2008  . 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 ASTHMA 01/25/2008  . SLEEP APNEA 01/25/2008  . DYSPNEA 01/25/2008    Jule Ser, PT 10/03/2019, 10:30 AM  [facility] [address], [place], [place], [address] Phone: [phone]   Fax:  [phone]  Name: [name] MRN: [id number] Date of Birth: 1960-10-15

## 2019-10-03 NOTE — Patient Instructions (Signed)
Prescription for Trelegy-once daily for 3 months, rinse mouth after use Use Xopenex on an as-needed basis  Continue fexofenadine for allergies  For reflux, trial of omeprazole 20 mg daily OTC Small meals, at least 2 to 3 hours between last meal and bedtime Check with Dr. Hassell Done if gastric band is in position  At least 4 to 6 hours every night on your CPAP machine

## 2019-10-03 NOTE — Assessment & Plan Note (Signed)
For reflux, trial of omeprazole 20 mg daily OTC Small meals, at least 2 to 3 hours between last meal and bedtime Check with Dr. Hassell Done if gastric band is in position  On upper GI, LAP-BAND seems to be a GE junction

## 2019-10-03 NOTE — Assessment & Plan Note (Addendum)
Prescription for Trelegy-once daily for 3 months, rinse mouth after use -this was given by allergist and seems to work better than Symbicort, so we will continue Use Xopenex on an as-needed basis  Triggers seem to be seasonal allergies and GERD, clearly obesity/OSA contributing  Continue fexofenadine for allergies  May have to define profile better with phenol, eosinophils and RAST on future visits

## 2019-10-10 ENCOUNTER — Ambulatory Visit: Payer: BC Managed Care – PPO | Admitting: Physical Therapy

## 2019-10-19 ENCOUNTER — Other Ambulatory Visit: Payer: Self-pay

## 2019-10-19 ENCOUNTER — Encounter: Payer: Self-pay | Admitting: Physical Therapy

## 2019-10-19 ENCOUNTER — Ambulatory Visit: Payer: BC Managed Care – PPO | Attending: General Surgery | Admitting: Physical Therapy

## 2019-10-19 DIAGNOSIS — M6281 Muscle weakness (generalized): Secondary | ICD-10-CM

## 2019-10-19 DIAGNOSIS — R279 Unspecified lack of coordination: Secondary | ICD-10-CM | POA: Insufficient documentation

## 2019-10-19 NOTE — Therapy (Signed)
[facility] [address], [place], [place], [address] Phone: [phone]   Fax:  [phone]  Physical Therapy Treatment  Patient Details  Name: [name] MRN: [id number] Date of Birth: [birth year] Referring Provider (PT): Leighton Ruff, MD   Encounter Date: 10/19/2019   PT End of Session - 10/19/19 1040    Visit Number 4    Date for PT Re-Evaluation 11/21/19    PT Start Time 1036   arrived late, but able to stay longer   PT Stop Time 1114    PT Time Calculation (min) 38 min    Activity Tolerance Patient tolerated treatment well    Behavior During Therapy [facility] for tasks assessed/performed           Past Medical History:  Diagnosis Date  . Anemia   . Anxiety   . Arthritis    knee   . Asthma   . Back pain   . Depression   . Diabetes ([place])   . DVT (deep venous thrombosis) ([place])   . Dysfunctional uterine bleeding   . Dyspnea   . GERD (gastroesophageal reflux disease)   . Glaucoma   . Headache(784.0)    MIGRAINES IN PAST  . Hypertension   . Hypothyroidism   . Joint pain   . Lactose intolerance   . Leg edema   . Morbid obesity ([place])   . Multiple food allergies   . Optic neuropathy   . Osteoarthritis   . PE (pulmonary embolism)   . Sleep apnea, obstructive    USES C-PAP    Past Surgical History:  Procedure Laterality Date  . CAPSULOTOMY  05/03/2012   Procedure: MINOR CAPSULOTOMY;  Surgeon: Myrtha Mantis., MD;  Location: [place];  Service: Ophthalmology;  Laterality: Right;  . CAPSULOTOMY  05/05/2012   Procedure: MINOR CAPSULOTOMY;  Surgeon: Myrtha Mantis., MD;  Location: [place];  Service: Ophthalmology;  Laterality: Right;  . CATARACT SURG    . DILATION AND CURETTAGE OF UTERUS    . ENDOMETRIAL ABLATION     failure   . EYE SURGERY  11/16/2009   FOR GLAUCOMA AND LASER SURGERY  X2  . GASTRIC BYPASS  [id number]  . GLAUCOMA SURGERY  10/31/2009  . HERNIA REPAIR    . HIATAL HERNIA  REPAIR  01/30/2014   Procedure: HERNIA REPAIR HIATAL;  Surgeon: Pedro Earls, MD;  [facility];  Service: General;;  . HYSTEROSCOPY    . IVC FILTER     2010 - THEN REMOVED THEN PLACED AGAIN 2012  . JOINT REPLACEMENT    . KNEE ARTHROSCOPY  12/2007  . LAPAROSCOPIC GASTRIC BANDING N/A 01/30/2014   Procedure: LAPAROSCOPIC GASTRIC BANDING over bypass ;  Surgeon: Pedro Earls, MD;  [facility];  Service: General;  Laterality: N/A;  . POSTERIOR LAMINECTOMY / DECOMPRESSION CERVICAL SPINE  01/26/2011  . TOTAL KNEE ARTHROPLASTY  09/09/2011   Procedure: TOTAL KNEE ARTHROPLASTY;  Surgeon: Johnn Hai, MD;  [facility];  Service: Orthopedics;  Laterality: Left;  . TOTAL KNEE ARTHROPLASTY Right 03/22/2014   Procedure: RIGHT TOTAL KNEE ARTHROPLASTY;  Surgeon: Johnn Hai, MD;  [facility];  Service: Orthopedics;  Laterality: Right;  . UPPER GI ENDOSCOPY N/A 01/30/2014   Procedure: UPPER GI ENDOSCOPY;  Surgeon: Pedro Earls, MD;  [facility];  Service: General;  Laterality: N/A;  . YAG LASER APPLICATION  07/28/7351   Procedure: YAG LASER APPLICATION;  Surgeon: Marcello Moores  E Bobbye Charleston., MD;  Location: [place];  Service: Ophthalmology;  Laterality: Right;  . YAG LASER APPLICATION  12/12/2351   Procedure: YAG LASER APPLICATION;  Surgeon: Myrtha Mantis., MD;  Location: [place];  Service: Ophthalmology;  Laterality: Right;    There were no vitals filed for this visit.   Subjective Assessment - 10/19/19 1043    Subjective States the exercises are going well. Pt reports her boyfriend passed recently and has been a tough week.  I felt very sore after the massage for 2 days and then had a firm BM which was different than it has been for a while    Patient Stated Goals not have the leakage    Currently in Pain? No/denies                             [facility] Adult PT Treatment/Exercise - 10/19/19 0001      Lumbar Exercises: Aerobic   Nustep L3 x 8  min - PT present for status and cues to engage core for warm up and endurance      Lumbar Exercises: Standing   Heel Raises 15 reps   on foam mat   Other Standing Lumbar Exercises march on foam mat - core and pelvic floor engaged - 10x      Lumbar Exercises: Seated   Other Seated Lumbar Exercises clam green band - 15x      Lumbar Exercises: Supine   Other Supine Lumbar Exercises UE overhead core engaged 10x; shoulder ext core engaged 10x      Manual Therapy   Myofascial Release abdominal fascial release along colon and rectum midline abdomen                    PT Short Term Goals - 10/19/19 1044      PT SHORT TERM GOAL #1   Title ind with initial HEP    Status Achieved             PT Long Term Goals - 10/19/19 1044      PT LONG TERM GOAL #1   Title Pt will be ind with HEP    Status On-going      PT LONG TERM GOAL #2   Title Pt will report improved sensation and can tell when she is going to have a BM      PT LONG TERM GOAL #3   Title Pt will be able to contract with 3/5 MMT and hold for at least 10 sec for improved bowel control    Status On-going                 Plan - 10/19/19 1234    Clinical Impression Statement Pt reports improved BM last week that was more complete and firm. States she felt the abdominal fascial release had something to do with it.  Pt was able to perform more exercises and educated on how to incorporate throughout the day for increased movement.    PT Treatment/Interventions ADLs/Self Care Home Management;Aquatic Therapy;Biofeedback;Cryotherapy;Electrical Stimulation;Neuromuscular re-education;Therapeutic exercise;Therapeutic activities;Patient/family education;Wheelchair mobility training;Passive range of motion;Dry needling;Manual techniques;Taping;Moist Heat    PT Next Visit Plan f/u with movement every hour; abdominal fascial release if needed; porgress strength nustep warm up    PT Home Exercise Plan Access Code: [email or web address]      Consulted and Agree with Plan of Care Patient  Patient will benefit from skilled therapeutic intervention in order to improve the following deficits and impairments:  Postural dysfunction, Decreased strength, Decreased coordination  Visit Diagnosis: Muscle weakness (generalized)  Unspecified lack of coordination     Problem List Patient Active Problem List   Diagnosis Date Noted  . GERD (gastroesophageal reflux disease) 10/03/2019  . 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 Primary open angle glaucoma of both eyes, severe stage 09/04/2019  . Nondiabetic proliferative retinopathy, left 09/04/2019  . Shortness of breath on exertion 11/04/2017  . Type 2 diabetes mellitus without complication, without long-term current use of insulin ([place]) 11/04/2017  . Essential hypertension 11/04/2017  . Other headache syndrome 08/30/2014  . Speech disturbance 08/30/2014  . Vertigo 08/30/2014  . Right knee DJD 03/22/2014  . 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 Anemia 09/10/2011  . ARF (acute renal failure) ([place]) 09/10/2011  . Paresthesias 09/10/2011  . Total knee replacement status, left 09/10/2011  . Cervical spondylosis with myelopathy 06/30/2011  . Osteoarthritis of both knees 06/30/2011  . Diabetes mellitus   . Arthritis   . Sleep apnea, obstructive   . Morbid obesity ([place])   . Dysfunctional uterine bleeding   . HEPATITIS C 01/25/2008  . HYPERTENSION 01/25/2008  . 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 Moderate persistent asthma 01/25/2008  . SLEEP APNEA 01/25/2008  . Asthma, persistent not controlled 01/25/2008    Jule Ser ,PT 10/19/2019, 12:36 PM  [facility] [address] [place], [place], [address] Phone: [phone]   Fax:  [phone]  Name: [name] MRN: [id number] Date of Birth: [birth year]

## 2019-10-26 ENCOUNTER — Ambulatory Visit: Payer: BC Managed Care – PPO | Admitting: Physical Therapy

## 2019-11-02 ENCOUNTER — Ambulatory Visit: Payer: BC Managed Care – PPO | Admitting: Physical Therapy

## 2019-11-02 ENCOUNTER — Other Ambulatory Visit: Payer: Self-pay

## 2019-11-02 DIAGNOSIS — M6281 Muscle weakness (generalized): Secondary | ICD-10-CM | POA: Diagnosis not present

## 2019-11-02 DIAGNOSIS — R279 Unspecified lack of coordination: Secondary | ICD-10-CM

## 2019-11-02 NOTE — Therapy (Signed)
[facility] [address] [place], [place], [address] Phone: [phone]   Fax:  [phone]  Physical Therapy Treatment  Patient Details  Name: [name] MRN: [id number] Date of Birth: 09/27/1960 Referring Provider (PT): Leighton Ruff, MD   Encounter Date: 11/02/2019   PT End of Session - 11/02/19 1025    Visit Number 5    Date for PT Re-Evaluation 11/21/19    PT Start Time 1018    PT Stop Time 1058    PT Time Calculation (min) 40 min    Activity Tolerance Patient tolerated treatment well    Behavior During Therapy [facility] for tasks assessed/performed           Past Medical History:  Diagnosis Date  . Anemia   . Anxiety   . Arthritis    knee   . Asthma   . Back pain   . Depression   . Diabetes ([place])   . DVT (deep venous thrombosis) ([place])   . Dysfunctional uterine bleeding   . Dyspnea   . GERD (gastroesophageal reflux disease)   . Glaucoma   . Headache(784.0)    MIGRAINES IN PAST  . Hypertension   . Hypothyroidism   . Joint pain   . Lactose intolerance   . Leg edema   . Morbid obesity ([place])   . Multiple food allergies   . Optic neuropathy   . Osteoarthritis   . PE (pulmonary embolism)   . Sleep apnea, obstructive    USES C-PAP    Past Surgical History:  Procedure Laterality Date  . CAPSULOTOMY  05/03/2012   Procedure: MINOR CAPSULOTOMY;  Surgeon: Myrtha Mantis., MD;  Location: [place];  Service: Ophthalmology;  Laterality: Right;  . CAPSULOTOMY  05/05/2012   Procedure: MINOR CAPSULOTOMY;  Surgeon: Myrtha Mantis., MD;  Location: [place];  Service: Ophthalmology;  Laterality: Right;  . CATARACT SURG    . DILATION AND CURETTAGE OF UTERUS    . ENDOMETRIAL ABLATION     failure   . EYE SURGERY  11/16/2009   FOR GLAUCOMA AND LASER SURGERY  X2  . GASTRIC BYPASS  [id number]  . GLAUCOMA SURGERY  10/31/2009  . HERNIA REPAIR    . HIATAL HERNIA REPAIR  01/30/2014   Procedure: HERNIA  REPAIR HIATAL;  Surgeon: Pedro Earls, MD;  [facility];  Service: General;;  . HYSTEROSCOPY    . IVC FILTER     2010 - THEN REMOVED THEN PLACED AGAIN 2012  . JOINT REPLACEMENT    . KNEE ARTHROSCOPY  12/2007  . LAPAROSCOPIC GASTRIC BANDING N/A 01/30/2014   Procedure: LAPAROSCOPIC GASTRIC BANDING over bypass ;  Surgeon: Pedro Earls, MD;  [facility];  Service: General;  Laterality: N/A;  . POSTERIOR LAMINECTOMY / DECOMPRESSION CERVICAL SPINE  01/26/2011  . TOTAL KNEE ARTHROPLASTY  09/09/2011   Procedure: TOTAL KNEE ARTHROPLASTY;  Surgeon: Johnn Hai, MD;  [facility];  Service: Orthopedics;  Laterality: Left;  . TOTAL KNEE ARTHROPLASTY Right 03/22/2014   Procedure: RIGHT TOTAL KNEE ARTHROPLASTY;  Surgeon: Johnn Hai, MD;  [facility];  Service: Orthopedics;  Laterality: Right;  . UPPER GI ENDOSCOPY N/A 01/30/2014   Procedure: UPPER GI ENDOSCOPY;  Surgeon: Pedro Earls, MD;  [facility];  Service: General;  Laterality: N/A;  . YAG LASER APPLICATION  7/82/4235   Procedure: YAG LASER APPLICATION;  Surgeon: Myrtha Mantis., MD;  Location: [place];  Service: Ophthalmology;  Laterality: Right;  . YAG LASER APPLICATION  0/12/3816   Procedure: YAG LASER APPLICATION;  Surgeon: Myrtha Mantis., MD;  Location: [place];  Service: Ophthalmology;  Laterality: Right;    There were no vitals filed for this visit.   Subjective Assessment - 11/02/19 1019    Subjective Pt states she had some of the fluid removed from the band and she can tell a difference in the stomach and the stool has been less loose.  Pt states she also stopped drinking as much water.  She is not drinking as much water and is now drinking 4 glasses/day    Patient Stated Goals not have the leakage    Currently in Pain? No/denies                             [facility] Adult PT Treatment/Exercise - 11/02/19 0001      Lumbar Exercises: Aerobic   Nustep L3 x 10 min -  PT present for status and cues to engage core for warm up and endurance      Lumbar Exercises: Standing   Heel Raises 20 reps   kegel when lifting   Functional Squats Limitations mini squat with UE support x2 - 2 x 10    Other Standing Lumbar Exercises 1.5 lb ankle weights - tapping 6" step and hip abduction 10x each      Lumbar Exercises: Seated   Long Arc Quad on Chair Strengthening;Both;20 reps    LAQ on Chair Weights (lbs) --   1.5   LAQ on Chair Limitations 3 sec hold; ball squeeze      Lumbar Exercises: Supine   Clam 15 reps    Clam Limitations green band    Bridge 15 reps                    PT Short Term Goals - 10/19/19 1044      PT SHORT TERM GOAL #1   Title ind with initial HEP    Status Achieved             PT Long Term Goals - 10/19/19 1044      PT LONG TERM GOAL #1   Title Pt will be ind with HEP    Status On-going      PT LONG TERM GOAL #2   Title Pt will report improved sensation and can tell when she is going to have a BM      PT LONG TERM GOAL #3   Title Pt will be able to contract with 3/5 MMT and hold for at least 10 sec for improved bowel control    Status On-going                 Plan - 11/02/19 1023    Clinical Impression Statement Pt was recommended to go back to her usual 10 glasses of water due to recently restricting significantly.  Pt did well with exercise progression today adding resistance.  She needs cues intermittently to engage the pelvic floor throughout her exercises.  Overall, she reports stool is more firm and that she can tell when she is going to have a BM more easily.  States she had leakge one time this past week    PT Treatment/Interventions ADLs/Self Care Home Management;Aquatic Therapy;Biofeedback;Cryotherapy;Electrical Stimulation;Neuromuscular re-education;Therapeutic exercise;Therapeutic activities;Patient/family education;Wheelchair mobility training;Passive range of motion;Dry needling;Manual  techniques;Taping;Moist Heat    PT Next Visit  Plan abdominal fascial release if needed; porgress strength nustep warm up    PT Home Exercise Plan Access Code: [email or web address]    FIEPPIRJJ and Agree with Plan of Care Patient           Patient will benefit from skilled therapeutic intervention in order to improve the following deficits and impairments:  Postural dysfunction, Decreased strength, Decreased coordination  Visit Diagnosis: Muscle weakness (generalized)  Unspecified lack of coordination     Problem List Patient Active Problem List   Diagnosis Date Noted  . GERD (gastroesophageal reflux disease) 10/03/2019  . 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 Primary open angle glaucoma of both eyes, severe stage 09/04/2019  . Nondiabetic proliferative retinopathy, left 09/04/2019  . Shortness of breath on exertion 11/04/2017  . Type 2 diabetes mellitus without complication, without long-term current use of insulin ([place]) 11/04/2017  . Essential hypertension 11/04/2017  . Other headache syndrome 08/30/2014  . Speech disturbance 08/30/2014  . Vertigo 08/30/2014  . Right knee DJD 03/22/2014  . 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 Anemia 09/10/2011  . ARF (acute renal failure) ([place]) 09/10/2011  . Paresthesias 09/10/2011  . Total knee replacement status, left 09/10/2011  . Cervical spondylosis with myelopathy 06/30/2011  . Osteoarthritis of both knees 06/30/2011  . Diabetes mellitus   . Arthritis   . Sleep apnea, obstructive   . Morbid obesity ([place])   . Dysfunctional uterine bleeding   . HEPATITIS C 01/25/2008  . HYPERTENSION 01/25/2008  . 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 Moderate persistent asthma 01/25/2008  . SLEEP APNEA 01/25/2008  . Asthma, persistent not controlled 01/25/2008    Jule Ser, PT 11/02/2019, 12:15 PM  [facility] [address] [place], [place], [address] Phone: [phone]   Fax:  [phone]  Name: [name] MRN: [id number] Date of Birth: [birth year]

## 2019-11-09 ENCOUNTER — Encounter: Payer: Self-pay | Admitting: Physical Therapy

## 2019-11-09 ENCOUNTER — Other Ambulatory Visit: Payer: Self-pay

## 2019-11-09 ENCOUNTER — Ambulatory Visit: Payer: BC Managed Care – PPO | Admitting: Physical Therapy

## 2019-11-09 DIAGNOSIS — M6281 Muscle weakness (generalized): Secondary | ICD-10-CM | POA: Diagnosis not present

## 2019-11-09 DIAGNOSIS — R279 Unspecified lack of coordination: Secondary | ICD-10-CM

## 2019-11-09 NOTE — Therapy (Addendum)
[facility] [address] [place], [place], [address] Phone: [phone]   Fax:  [phone]  Physical Therapy Treatment  Patient Details  Name: [name] MRN: [id number] Date of Birth: November 03, 1960 Referring Provider (PT): Leighton Ruff, MD   Encounter Date: 11/09/2019   PT End of Session - 11/09/19 1031    Visit Number 6    Date for PT Re-Evaluation 11/21/19    PT Start Time 3335    PT Stop Time 1055    PT Time Calculation (min) 40 min    Activity Tolerance Patient tolerated treatment well           Past Medical History:  Diagnosis Date  . Anemia   . Anxiety   . Arthritis    knee   . Asthma   . Back pain   . Depression   . Diabetes ([place])   . DVT (deep venous thrombosis) ([place])   . Dysfunctional uterine bleeding   . Dyspnea   . GERD (gastroesophageal reflux disease)   . Glaucoma   . Headache(784.0)    MIGRAINES IN PAST  . Hypertension   . Hypothyroidism   . Joint pain   . Lactose intolerance   . Leg edema   . Morbid obesity ([place])   . Multiple food allergies   . Optic neuropathy   . Osteoarthritis   . PE (pulmonary embolism)   . Sleep apnea, obstructive    USES C-PAP    Past Surgical History:  Procedure Laterality Date  . CAPSULOTOMY  05/03/2012   Procedure: MINOR CAPSULOTOMY;  Surgeon: Myrtha Mantis., MD;  Location: [place];  Service: Ophthalmology;  Laterality: Right;  . CAPSULOTOMY  05/05/2012   Procedure: MINOR CAPSULOTOMY;  Surgeon: Myrtha Mantis., MD;  Location: [place];  Service: Ophthalmology;  Laterality: Right;  . CATARACT SURG    . DILATION AND CURETTAGE OF UTERUS    . ENDOMETRIAL ABLATION     failure   . EYE SURGERY  11/16/2009   FOR GLAUCOMA AND LASER SURGERY  X2  . GASTRIC BYPASS  [id number]  . GLAUCOMA SURGERY  10/31/2009  . HERNIA REPAIR    . HIATAL HERNIA REPAIR  01/30/2014   Procedure: HERNIA REPAIR HIATAL;  Surgeon: Pedro Earls, MD;  [facility];  Service: General;;  . HYSTEROSCOPY    . IVC FILTER     2010 - THEN REMOVED THEN PLACED AGAIN 2012  . JOINT REPLACEMENT    . KNEE ARTHROSCOPY  12/2007  . LAPAROSCOPIC GASTRIC BANDING N/A 01/30/2014   Procedure: LAPAROSCOPIC GASTRIC BANDING over bypass ;  Surgeon: Pedro Earls, MD;  [facility];  Service: General;  Laterality: N/A;  . POSTERIOR LAMINECTOMY / DECOMPRESSION CERVICAL SPINE  01/26/2011  . TOTAL KNEE ARTHROPLASTY  09/09/2011   Procedure: TOTAL KNEE ARTHROPLASTY;  Surgeon: Johnn Hai, MD;  [facility];  Service: Orthopedics;  Laterality: Left;  . TOTAL KNEE ARTHROPLASTY Right 03/22/2014   Procedure: RIGHT TOTAL KNEE ARTHROPLASTY;  Surgeon: Johnn Hai, MD;  [facility];  Service: Orthopedics;  Laterality: Right;  . UPPER GI ENDOSCOPY N/A 01/30/2014   Procedure: UPPER GI ENDOSCOPY;  Surgeon: Pedro Earls, MD;  [facility];  Service: General;  Laterality: N/A;  . YAG LASER APPLICATION  9/37/3428   Procedure: YAG LASER APPLICATION;  Surgeon: Myrtha Mantis., MD;  Location: [place];  Service: Ophthalmology;  Laterality: Right;  . YAG LASER  APPLICATION  3/55/7322   Procedure: YAG LASER APPLICATION;  Surgeon: Myrtha Mantis., MD;  Location: [place];  Service: Ophthalmology;  Laterality: Right;    There were no vitals filed for this visit.   Subjective Assessment - 11/09/19 1023    Subjective Pt states she increased her water intake and feels better and lost 10 lbs.  Pt states the exercises are still helping and is 85% improved.    Patient Stated Goals not have the leakage    Currently in Pain? No/denies                             [facility] Adult PT Treatment/Exercise - 11/09/19 0001      Lumbar Exercises: Stretches   Gastroc Stretch Right;Left;30 seconds;2 reps    Other Lumbar Stretch Exercise trunk rotation - 10x      Lumbar Exercises: Aerobic   Nustep L3 x 10 min - PT present for status and cues to engage  core for warm up and endurance      Lumbar Exercises: Standing   Heel Raises 10 reps   on step - down past step   Functional Squats Limitations mini squat with UE support x2 - 2 x 10    Other Standing Lumbar Exercises 2 lb ankle weights - tapping 6" step and hip abduction 10x each      Lumbar Exercises: Seated   Long Arc Quad on Chair Strengthening;Both;20 reps    LAQ on Chair Weights (lbs) --   1.5   LAQ on Chair Limitations 3 sec hold; ball squeeze    Other Seated Lumbar Exercises clam yellow loop - 20x      Lumbar Exercises: Supine   Clam --    Clam Limitations --    Heel Slides 10 reps    Heel Slides Limitations bilat    Bridge 15 reps    Straight Leg Raise 10 reps;2 seconds                  PT Education - 11/09/19 1100    Education Details Access Code: [email or web address]    Person(s) Educated Patient    Methods Explanation;Demonstration;Handout;Verbal cues    Comprehension Verbalized understanding;Returned demonstration            PT Short Term Goals - 10/19/19 1044      PT SHORT TERM GOAL #1   Title ind with initial HEP    Status Achieved             PT Long Term Goals - 10/19/19 1044      PT LONG TERM GOAL #1   Title Pt will be ind with HEP    Status On-going      PT LONG TERM GOAL #2   Title Pt will report improved sensation and can tell when she is going to have a BM      PT LONG TERM GOAL #3   Title Pt will be able to contract with 3/5 MMT and hold for at least 10 sec for improved bowel control    Status On-going                 Plan - 11/09/19 1037    Clinical Impression Statement Pt was able to increase water and feels that helped. Pt was able to increase resistance level of exercises again today.  Pt doing well working towards long term goals and is 85% improved.  Most  likely discharge with final HEP next    PT Treatment/Interventions ADLs/Self Care Home Management;Aquatic Therapy;Biofeedback;Cryotherapy;Electrical  Stimulation;Neuromuscular re-education;Therapeutic exercise;Therapeutic activities;Patient/family education;Wheelchair mobility training;Passive range of motion;Dry needling;Manual techniques;Taping;Moist Heat    PT Next Visit Plan progress and add to HEP as able    PT Home Exercise Plan Access Code: [email or web address]    [email or web address] and Agree with Plan of Care Patient           Patient will benefit from skilled therapeutic intervention in order to improve the following deficits and impairments:  Postural dysfunction, Decreased strength, Decreased coordination  Visit Diagnosis: Muscle weakness (generalized)  Unspecified lack of coordination     Problem List Patient Active Problem List   Diagnosis Date Noted  . GERD (gastroesophageal reflux disease) 10/03/2019  . 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 Primary open angle glaucoma of both eyes, severe stage 09/04/2019  . Nondiabetic proliferative retinopathy, left 09/04/2019  . Shortness of breath on exertion 11/04/2017  . Type 2 diabetes mellitus without complication, without long-term current use of insulin ([place]) 11/04/2017  . Essential hypertension 11/04/2017  . Other headache syndrome 08/30/2014  . Speech disturbance 08/30/2014  . Vertigo 08/30/2014  . Right knee DJD 03/22/2014  . 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 Anemia 09/10/2011  . ARF (acute renal failure) ([place]) 09/10/2011  . Paresthesias 09/10/2011  . Total knee replacement status, left 09/10/2011  . Cervical spondylosis with myelopathy 06/30/2011  . Osteoarthritis of both knees 06/30/2011  . Diabetes mellitus   . Arthritis   . Sleep apnea, obstructive   . Morbid obesity ([place])   . Dysfunctional uterine bleeding   . HEPATITIS C 01/25/2008  . HYPERTENSION 01/25/2008  . 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 Moderate persistent asthma 01/25/2008  . SLEEP APNEA 01/25/2008  . Asthma, persistent not controlled  01/25/2008    Jule Ser, PT 11/09/2019, 11:03 AM  [facility] [address], [place], [place], [address] Phone: [phone]   Fax:  [phone]  Name: [name] MRN: [id number] Date of Birth: [birth year]  PHYSICAL THERAPY DISCHARGE SUMMARY  Visits from Start of Care: 6  Current functional level related to goals / functional outcomes: See above details   Remaining deficits: See above   Education / Equipment: HEP  Plan: Patient agrees to discharge.  Patient goals were partially met. Patient is being discharged due to not returning since the last visit.  ?????    [employer], PT 01/16/20 8:08 AM

## 2019-11-09 NOTE — Patient Instructions (Signed)
Access Code: [email or web address] URL: [email or web address] Date: 11/09/2019 Prepared by: Jari Favre  Exercises Supine Hip Adductor Squeeze with Small Ball - 3 x daily - 7 x weekly - 1 sets - 10 reps - 2 sec hold Supine Diaphragmatic Breathing - 3 x daily - 7 x weekly - 10 reps - 1 sets Seated Pelvic Floor Contraction with Isometric Hip Adduction - 3 x daily - 7 x weekly - 1 sets - 10 reps - 3 sechold, 3 sec rest hold Supine Posterior Pelvic Tilt - 2 x daily - 7 x weekly - 2 sets - 10 reps - 3 hold Standing March with Counter Support - 1 x daily - 7 x weekly - 3 sets - 10 reps Seated Long Arc Quad with Hip Adduction - 1 x daily - 7 x weekly - 3 sets - 10 reps - 3 sec hold Mini Squat with Pelvic Floor Contraction - 1 x daily - 7 x weekly - 3 sets - 10 reps Lower Trunk Rotations - 1 x daily - 7 x weekly - 3 sets - 10 reps Standing Hip Abduction with Anterior Support - 1 x daily - 7 x weekly - 3 sets - 10 reps

## 2019-11-13 ENCOUNTER — Ambulatory Visit (INDEPENDENT_AMBULATORY_CARE_PROVIDER_SITE_OTHER): Payer: BC Managed Care – PPO | Admitting: Ophthalmology

## 2019-11-13 ENCOUNTER — Encounter (INDEPENDENT_AMBULATORY_CARE_PROVIDER_SITE_OTHER): Payer: Self-pay | Admitting: Ophthalmology

## 2019-11-13 ENCOUNTER — Other Ambulatory Visit: Payer: Self-pay

## 2019-11-13 DIAGNOSIS — H4312 Vitreous hemorrhage, left eye: Secondary | ICD-10-CM | POA: Diagnosis not present

## 2019-11-13 DIAGNOSIS — H34812 Central retinal vein occlusion, left eye, with macular edema: Secondary | ICD-10-CM | POA: Diagnosis not present

## 2019-11-13 DIAGNOSIS — H348121 Central retinal vein occlusion, left eye, with retinal neovascularization: Secondary | ICD-10-CM | POA: Diagnosis not present

## 2019-11-13 HISTORY — DX: Vitreous hemorrhage, left eye: H43.12

## 2019-11-13 NOTE — Assessment & Plan Note (Signed)
Schedule intravitreal Avastin OS 2 hasten clearance of vitreous hemorrhage secondary to neovascular complications from underlying central retinal vein occlusion.  Prognostically excellent sign visual acuity still 20/30 through the fairly dense vitreous hemorrhage present on examination clinically and hampering visualization via OCT

## 2019-11-13 NOTE — Assessment & Plan Note (Signed)
The nature of the vitreous hemorrhage was discussed with the patient as well as the common causes.   Patients with diabetic eye disease may develop retinal neovascularization.  Other eye conditions develop retinal  neovascularization secondary to retinal venous occlusions.   Vitreous hemorrhage may result from spontaneous vitreous detachment or retinal breaks.  Blunt trauma is a common cause as well.  The need for serial evaluation of the fundus (interior of the eye) until  clear views are obtained was addressed. An occasional need  to monitor the condition by in office  B-scan ultrasonography in the case of dense vitreous hemorrhage was discussed.  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  Will need to schedule for intravitreal injection of Avastin OS soon

## 2019-11-13 NOTE — Progress Notes (Signed)
11/13/2019     CHIEF COMPLAINT Patient presents for Blurred Vision   HISTORY OF PRESENT ILLNESS: [name] is a 59 y.o. female who presents to the clinic today for:   HPI    Blurred Vision    In left eye.  Vision is hazy.  Severity is moderate.  This started 1 day ago.  Occurring constantly.  It is worse throughout the day.  Since onset it is gradually worsening.  Associated symptoms include eye pain.  Treatments tried include no treatments.  Response to treatment was no improvement.  I, the attending physician,  performed the HPI with the patient and updated documentation appropriately.          Comments    Pt c/o OS vision being hazy. Pt states onset was last night. Pt states it is constantly hazy and she has had some pain.        Last edited by Tilda Franco on 11/13/2019 10:31 AM. (History)      Referring physician: Lucianne Lei, MD [address] [place],  [place] [address]  HISTORICAL INFORMATION:   Selected notes from the MEDICAL RECORD NUMBER    Lab Results  Component Value Date   HGBA1C 5.5 11/04/2017     CURRENT MEDICATIONS: Current Outpatient Medications (Ophthalmic Drugs)  Medication Sig  . bimatoprost (LUMIGAN) 0.01 % SOLN 1 drop at bedtime.  . brimonidine (ALPHAGAN) 0.15 % ophthalmic solution Place 1 drop into both eyes 2 (two) times daily.   . brinzolamide (AZOPT) 1 % ophthalmic suspension Place 1 drop into both eyes 2 (two) times daily.   . COMBIGAN 0.2-0.5 % ophthalmic solution Apply 1 drop to eye 3 (three) times daily.  . Olopatadine HCl (PATADAY) 0.2 % SOLN Apply 1 drop to eye 2 (two) times daily as needed.  Marland Kitchen PAZEO 0.7 % SOLN   . prednisoLONE acetate (PRED FORTE) 1 % ophthalmic suspension Place 1 drop into the left eye 4 (four) times daily.  Marland Kitchen Propylene Glycol (SYSTANE BALANCE) 0.6 % SOLN Apply to eye.  Marland Kitchen RHOPRESSA 0.02 % SOLN Place 1 drop into the right eye in the morning.   No current facility-administered medications for this  visit. (Ophthalmic Drugs)   Current Outpatient Medications (Other)  Medication Sig  . acetaminophen (TYLENOL) 500 MG tablet Take 1,000 mg by mouth every 4 (four) hours as needed for moderate pain.  . Ascorbic Acid (VITAMIN C) 1000 MG tablet Take 1,000 mg by mouth every morning.   Marland Kitchen azelastine (ASTELIN) 0.1 % nasal spray Place 2 sprays into both nostrils 2 (two) times daily as needed (nasal drainage). Use in each nostril as directed  . b complex vitamins tablet Take 1 tablet by mouth every morning.   . Calcium Carbonate-Vit D-Min (CALTRATE 600+D PLUS MINERALS PO) Take 1 tablet by mouth 3 (three) times daily.   . Cyanocobalamin (VITAMIN B-12) 2500 MCG SUBL Place 2 tablets under the tongue at bedtime.  . cyclobenzaprine (FLEXERIL) 10 MG tablet Take 10 mg by mouth at bedtime.   Marland Kitchen diltiazem (DILACOR XR) 240 MG 24 hr capsule Take 240 mg by mouth at bedtime.  . fexofenadine (ALLEGRA) 180 MG tablet Take 1 tablet by mouth daily.  . fish oil-omega-3 fatty acids 1000 MG capsule Take 1 g by mouth 2 (two) times daily.   . Fluticasone-Umeclidin-Vilant (TRELEGY ELLIPTA) 200-62.5-25 MCG/INH AEPB Inhale into the lungs daily.  . Fluticasone-Umeclidin-Vilant (TRELEGY ELLIPTA) 200-62.5-25 MCG/INH AEPB Inhale 1 puff into the lungs daily.  Marland Kitchen  furosemide (LASIX) 40 MG tablet Take 40 mg by mouth.  Marland Kitchen guaiFENesin (MUCINEX) 600 MG 12 hr tablet Take by mouth 2 (two) times daily.  Marland Kitchen ipratropium (ATROVENT HFA) 17 MCG/ACT inhaler Inhale 2 puffs into the lungs 4 (four) times daily.  Marland Kitchen levalbuterol (XOPENEX) 1.25 MG/3ML nebulizer solution Take 1.25 mg by nebulization every 6 (six) hours as needed for wheezing or shortness of breath.   . levocetirizine (XYZAL) 2.5 MG/5ML solution Take 2.5 mg by mouth every evening.   . magnesium chloride (SLOW-MAG) 64 MG TBEC Take 1-2 tablets by mouth 2 (two) times daily. Patient takes 2 tablet every morning and 1 tablet with dinner  . montelukast (SINGULAIR) 10 MG tablet Take 10 mg by mouth at  bedtime.  . Multiple Vitamin (MULTIVITAMIN) tablet Take 1 tablet by mouth 2 (two) times daily.   . niacin 500 MG tablet Take 500 mg by mouth daily with breakfast. Needs to be flush free per patient  . potassium chloride SA (K-DUR,KLOR-CON) 20 MEQ tablet Take 20-40 mEq by mouth 2 (two) times daily. 40 meq with breakfast and 20 meq with dinner  . pyridOXINE (VITAMIN B-6) 100 MG tablet Take 100 mg by mouth every morning.   . quiNINE (QUALAQUIN) 324 MG capsule Take 324 mg by mouth at bedtime.   . Rivaroxaban (XARELTO) 20 MG TABS Take 20 mg by mouth at bedtime.   . sodium chloride (OCEAN) 0.65 % SOLN nasal spray Place 2 sprays into both nostrils 2 (two) times daily.   . Sodium Chloride-Sodium Bicarb (NETI POT SINUS WASH NA) Place into the nose 2 (two) times daily as needed.  . valsartan-hydrochlorothiazide (DIOVAN-HCT) 80-12.5 MG per tablet Take 1 tablet by mouth every morning.   . Vitamin D, Ergocalciferol, (DRISDOL) 50000 UNITS CAPS capsule Take 50,000 Units by mouth every 7 (seven) days. Patient takes on Saturday   No current facility-administered medications for this visit. (Other)      REVIEW OF SYSTEMS:    ALLERGIES Allergies  Allergen Reactions  . Beta Adrenergic Blockers Other (See Comments)    RED EYES AND CONGESTION  . Hydrocodone   . Penicillins Hives, Itching and Swelling  . Sulfa Antibiotics Hives    RED EYES  . Thimerosal Hives and Itching    PAST MEDICAL HISTORY Past Medical History:  Diagnosis Date  . Anemia   . Anxiety   . Arthritis    knee   . Asthma   . Back pain   . Depression   . Diabetes ([place])   . DVT (deep venous thrombosis) ([place])   . Dysfunctional uterine bleeding   . Dyspnea   . GERD (gastroesophageal reflux disease)   . Glaucoma   . Headache(784.0)    MIGRAINES IN PAST  . Hypertension   . Hypothyroidism   . Joint pain   . Lactose intolerance   . Leg edema   . Morbid obesity ([place])   . Multiple food allergies   . Optic neuropathy   .  Osteoarthritis   . PE (pulmonary embolism)   . Sleep apnea, obstructive    USES C-PAP   Past Surgical History:  Procedure Laterality Date  . CAPSULOTOMY  05/03/2012   Procedure: [name] CAPSULOTOMY;  Surgeon: Myrtha Mantis., MD;  Location: [facility];  Service: Ophthalmology;  Laterality: Right;  . CAPSULOTOMY  05/05/2012   Procedure: [name] CAPSULOTOMY;  Surgeon: Myrtha Mantis., MD;  Location: [facility];  Service: Ophthalmology;  Laterality: Right;  . CATARACT SURG    .  DILATION AND CURETTAGE OF UTERUS    . ENDOMETRIAL ABLATION     failure   . EYE SURGERY  11/16/2009   FOR GLAUCOMA AND LASER SURGERY  X2  . GASTRIC BYPASS  [id number]  . GLAUCOMA SURGERY  10/31/2009  . HERNIA REPAIR    . HIATAL HERNIA REPAIR  01/30/2014   Procedure: HERNIA REPAIR HIATAL;  Surgeon: Pedro Earls, MD;  Location: [facility];  Service: General;;  . HYSTEROSCOPY    . IVC FILTER     2010 - THEN REMOVED THEN PLACED AGAIN 2012  . JOINT REPLACEMENT    . KNEE ARTHROSCOPY  12/2007  . LAPAROSCOPIC GASTRIC BANDING N/A 01/30/2014   Procedure: LAPAROSCOPIC GASTRIC BANDING over bypass ;  Surgeon: Pedro Earls, MD;  Location: [facility];  Service: General;  Laterality: N/A;  . POSTERIOR LAMINECTOMY / DECOMPRESSION CERVICAL SPINE  01/26/2011  . TOTAL KNEE ARTHROPLASTY  09/09/2011   Procedure: TOTAL KNEE ARTHROPLASTY;  Surgeon: Johnn Hai, MD;  Location: [facility];  Service: Orthopedics;  Laterality: Left;  . TOTAL KNEE ARTHROPLASTY Right 03/22/2014   Procedure: RIGHT TOTAL KNEE ARTHROPLASTY;  Surgeon: Johnn Hai, MD;  Location: [facility];  Service: Orthopedics;  Laterality: Right;  . UPPER GI ENDOSCOPY N/A 01/30/2014   Procedure: UPPER GI ENDOSCOPY;  Surgeon: Pedro Earls, MD;  Location: [facility];  Service: General;  Laterality: N/A;  . YAG LASER APPLICATION  08/04/3843   Procedure: YAG LASER APPLICATION;  Surgeon: Myrtha Mantis., MD;  Location: [facility];  Service: Ophthalmology;  Laterality: Right;  .  YAG LASER APPLICATION  3/64/6803   Procedure: YAG LASER APPLICATION;  Surgeon: Myrtha Mantis., MD;  Location: [facility];  Service: Ophthalmology;  Laterality: Right;    FAMILY HISTORY Family History  Problem Relation Age of Onset  . Arthritis Mother   . Hypertension Mother   . Diabetes Mother   . Stroke Mother   . Obesity Mother   . Arthritis Father   . Diabetes Father   . Alcohol abuse Father   . Hypertension Father   . Sudden death Father   . Glaucoma Other   . Cerebral aneurysm Other   . Stroke Other     SOCIAL HISTORY Social History   Tobacco Use  . Smoking status: Never Smoker  . Smokeless tobacco: Never Used  Vaping Use  . Vaping Use: Never used  Substance Use Topics  . Alcohol use: No  . Drug use: No         OPHTHALMIC EXAM:  Base Eye Exam    Visual Acuity (Snellen - Linear)      Right Left   Dist [place] 20/60 -2 20/30 -1   Dist ph [place] 20/25 +        Tonometry (Tonopen, 10:38 AM)      Right Left   Pressure 8 6       Neuro/Psych    Oriented x3: Yes   Mood/Affect: Normal       Dilation    Left eye: 1.0% Mydriacyl, 2.5% Phenylephrine @ 10:38 AM        Slit Lamp and Fundus Exam    External Exam      Right Left   External Normal Normal       Slit Lamp Exam      Right Left   Lids/Lashes Normal Normal   Conjunctiva/Sclera White and quiet White and quiet   Cornea Clear Clear   Anterior Chamber Deep and  quiet Deep and quiet   Iris Round and reactive Round and reactive   Lens Posterior chamber intraocular lens Posterior chamber intraocular lens   Anterior Vitreous Normal Normal       Fundus Exam      Right Left   Posterior Vitreous  Vitrectomized, , Vitreous hemorrhage 3+   Disc  Advanced cupping, still pink   C/D Ratio  0.85   Macula  Microaneurysms, no macular thickening   Vessels  Old central retinal vein occlusion, collateralized   Periphery  Laser scars          IMAGING AND PROCEDURES  Imaging and Procedures for  11/13/19           ASSESSMENT/PLAN:  Vitreous hemorrhage of left eye (HCC) The nature of the vitreous hemorrhage was discussed with the patient as well as the common causes.   Patients with diabetic eye disease may develop retinal neovascularization.  Other eye conditions develop retinal  neovascularization secondary to retinal venous occlusions.   Vitreous hemorrhage may result from spontaneous vitreous detachment or retinal breaks.  Blunt trauma is a common cause as well.  The need for serial evaluation of the fundus (interior of the eye) until  clear views are obtained was addressed. An occasional need  to monitor the condition by in office  B-scan ultrasonography in the case of dense vitreous hemorrhage was discussed.  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  Prognostically excellent sign visual acuity still 20/30 through the fairly dense vitreous hemorrhage present on examination clinically and hampering visualization via OCT      ICD-10-CM   1. 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 Vitreous hemorrhage of left eye (HCC)  H43.12   3. 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  We will schedule prompt injection intravitreal Avastin OS this week to hasten clearance of vitreous hemorrhage  2.  3.  Ophthalmic Meds Ordered this visit:  No orders of the defined types were placed in this encounter.      Return in about 2 days (around 11/15/2019) for dilate, COLOR FP, AVASTIN OCT, OS.  There are no Patient  Instructions on file for this visit.   Explained the diagnoses, plan, and follow up with the patient and they expressed understanding.  Patient expressed understanding of the importance of proper follow up care.   Clent Demark Cathy Ropp M.D. Diseases & Surgery of the Retina and Vitreous Retina & Diabetic [place] 11/13/19     Abbreviations: M myopia (nearsighted); A astigmatism; H hyperopia (farsighted); P presbyopia; Mrx spectacle prescription;  CTL contact lenses; OD right eye; OS left eye; OU both eyes  XT exotropia; ET esotropia; PEK punctate epithelial keratitis; PEE punctate epithelial erosions; DES dry eye syndrome; MGD meibomian gland dysfunction; ATs artificial tears; PFAT's preservative free artificial tears; [place] nuclear sclerotic cataract; PSC posterior subcapsular cataract; ERM epi-retinal membrane; PVD posterior vitreous detachment; RD retinal detachment; DM diabetes mellitus; DR diabetic retinopathy; NPDR non-proliferative diabetic retinopathy; PDR proliferative diabetic retinopathy; CSME clinically significant macular edema; DME diabetic macular edema; dbh dot blot hemorrhages; CWS cotton wool  spot; POAG primary open angle glaucoma; C/D cup-to-disc ratio; HVF humphrey visual field; GVF goldmann visual field; OCT optical coherence tomography; IOP intraocular pressure; BRVO Branch retinal vein occlusion; CRVO central retinal vein occlusion; CRAO central retinal artery occlusion; BRAO branch retinal artery occlusion; RT retinal tear; SB scleral buckle; PPV pars plana vitrectomy; VH Vitreous hemorrhage; PRP panretinal laser photocoagulation; IVK intravitreal kenalog; VMT vitreomacular traction; MH Macular hole;  NVD neovascularization of the disc; NVE neovascularization elsewhere; AREDS age related eye disease study; ARMD age related macular degeneration; POAG primary open angle glaucoma; EBMD epithelial/anterior basement membrane dystrophy; ACIOL anterior chamber intraocular lens; IOL intraocular  lens; PCIOL posterior chamber intraocular lens; Phaco/IOL phacoemulsification with intraocular lens placement; [place] photorefractive keratectomy; LASIK laser assisted in situ keratomileusis; HTN hypertension; DM diabetes mellitus; COPD chronic obstructive pulmonary disease

## 2019-11-16 ENCOUNTER — Ambulatory Visit (INDEPENDENT_AMBULATORY_CARE_PROVIDER_SITE_OTHER): Payer: BC Managed Care – PPO | Admitting: Ophthalmology

## 2019-11-16 ENCOUNTER — Other Ambulatory Visit: Payer: Self-pay

## 2019-11-16 ENCOUNTER — Encounter (INDEPENDENT_AMBULATORY_CARE_PROVIDER_SITE_OTHER): Payer: Self-pay | Admitting: Ophthalmology

## 2019-11-16 ENCOUNTER — Ambulatory Visit: Payer: BC Managed Care – PPO | Admitting: Physical Therapy

## 2019-11-16 DIAGNOSIS — H34812 Central retinal vein occlusion, left eye, with macular edema: Secondary | ICD-10-CM | POA: Diagnosis not present

## 2019-11-16 MED ORDER — BEVACIZUMAB CHEMO INJECTION 1.25MG/0.05ML SYRINGE FOR KALEIDOSCOPE
1.2500 mg | INTRAVITREAL | Status: AC | PRN
  Administered 2019-11-16: 1.25 mg via INTRAVITREAL

## 2019-11-16 NOTE — Progress Notes (Signed)
11/16/2019     CHIEF COMPLAINT Patient presents for Retina Follow Up   HISTORY OF PRESENT ILLNESS: [name] is a 59 y.o. female who presents to the clinic today for:   HPI    Retina Follow Up    Patient presents with  Other.  In left eye.  This started 3 days ago.  Severity is mild.  Duration of 3 days.  Since onset it is stable.          Comments    3 Day Avastin OS  Pt sts OS seems cloudy today. Pt sts she is still seeing some floaters OS like "squiggly lines" but not as many as 3 days ago. Pt denies ocular pain or flashes of light OU.        Last edited by Rockie Neighbours, [place] on 11/16/2019  3:52 PM. (History)      Referring physician: Lucianne Lei, MD [address] [place],  [place] [address]  HISTORICAL INFORMATION:   Selected notes from the MEDICAL RECORD NUMBER    Lab Results  Component Value Date   HGBA1C 5.5 11/04/2017     CURRENT MEDICATIONS: Current Outpatient Medications (Ophthalmic Drugs)  Medication Sig  . bimatoprost (LUMIGAN) 0.01 % SOLN 1 drop at bedtime.  . brimonidine (ALPHAGAN) 0.15 % ophthalmic solution Place 1 drop into both eyes 2 (two) times daily.   . brinzolamide (AZOPT) 1 % ophthalmic suspension Place 1 drop into both eyes 2 (two) times daily.   . COMBIGAN 0.2-0.5 % ophthalmic solution Apply 1 drop to eye 3 (three) times daily.  . Olopatadine HCl (PATADAY) 0.2 % SOLN Apply 1 drop to eye 2 (two) times daily as needed.  Marland Kitchen PAZEO 0.7 % SOLN   . prednisoLONE acetate (PRED FORTE) 1 % ophthalmic suspension Place 1 drop into the left eye 4 (four) times daily.  Marland Kitchen Propylene Glycol (SYSTANE BALANCE) 0.6 % SOLN Apply to eye.  Marland Kitchen RHOPRESSA 0.02 % SOLN Place 1 drop into the right eye in the morning.   No current facility-administered medications for this visit. (Ophthalmic Drugs)   Current Outpatient Medications (Other)  Medication Sig  . acetaminophen (TYLENOL) 500 MG tablet Take 1,000 mg by mouth every 4 (four) hours as needed for  moderate pain.  . Ascorbic Acid (VITAMIN C) 1000 MG tablet Take 1,000 mg by mouth every morning.   Marland Kitchen azelastine (ASTELIN) 0.1 % nasal spray Place 2 sprays into both nostrils 2 (two) times daily as needed (nasal drainage). Use in each nostril as directed  . b complex vitamins tablet Take 1 tablet by mouth every morning.   . Calcium Carbonate-Vit D-Min (CALTRATE 600+D PLUS MINERALS PO) Take 1 tablet by mouth 3 (three) times daily.   . Cyanocobalamin (VITAMIN B-12) 2500 MCG SUBL Place 2 tablets under the tongue at bedtime.  . cyclobenzaprine (FLEXERIL) 10 MG tablet Take 10 mg by mouth at bedtime.   Marland Kitchen diltiazem (DILACOR XR) 240 MG 24 hr capsule Take 240 mg by mouth at bedtime.  . fexofenadine (ALLEGRA) 180 MG tablet Take 1 tablet by mouth daily.  . fish oil-omega-3 fatty acids 1000 MG capsule Take 1 g by mouth 2 (two) times daily.   . Fluticasone-Umeclidin-Vilant (TRELEGY ELLIPTA) 200-62.5-25 MCG/INH AEPB Inhale into the lungs daily.  . Fluticasone-Umeclidin-Vilant (TRELEGY ELLIPTA) 200-62.5-25 MCG/INH AEPB Inhale 1 puff into the lungs daily.  . furosemide (LASIX) 40 MG tablet Take 40 mg by mouth.  Marland Kitchen guaiFENesin (MUCINEX) 600 MG 12 hr tablet  Take by mouth 2 (two) times daily.  Marland Kitchen ipratropium (ATROVENT HFA) 17 MCG/ACT inhaler Inhale 2 puffs into the lungs 4 (four) times daily.  Marland Kitchen levalbuterol (XOPENEX) 1.25 MG/3ML nebulizer solution Take 1.25 mg by nebulization every 6 (six) hours as needed for wheezing or shortness of breath.   . levocetirizine (XYZAL) 2.5 MG/5ML solution Take 2.5 mg by mouth every evening.   . magnesium chloride (SLOW-MAG) 64 MG TBEC Take 1-2 tablets by mouth 2 (two) times daily. Patient takes 2 tablet every morning and 1 tablet with dinner  . montelukast (SINGULAIR) 10 MG tablet Take 10 mg by mouth at bedtime.  . Multiple Vitamin (MULTIVITAMIN) tablet Take 1 tablet by mouth 2 (two) times daily.   . niacin 500 MG tablet Take 500 mg by mouth daily with breakfast. Needs to be flush  free per patient  . potassium chloride SA (K-DUR,KLOR-CON) 20 MEQ tablet Take 20-40 mEq by mouth 2 (two) times daily. 40 meq with breakfast and 20 meq with dinner  . pyridOXINE (VITAMIN B-6) 100 MG tablet Take 100 mg by mouth every morning.   . quiNINE (QUALAQUIN) 324 MG capsule Take 324 mg by mouth at bedtime.   . Rivaroxaban (XARELTO) 20 MG TABS Take 20 mg by mouth at bedtime.   . sodium chloride (OCEAN) 0.65 % SOLN nasal spray Place 2 sprays into both nostrils 2 (two) times daily.   . Sodium Chloride-Sodium Bicarb (NETI POT SINUS WASH NA) Place into the nose 2 (two) times daily as needed.  . valsartan-hydrochlorothiazide (DIOVAN-HCT) 80-12.5 MG per tablet Take 1 tablet by mouth every morning.   . Vitamin D, Ergocalciferol, (DRISDOL) 50000 UNITS CAPS capsule Take 50,000 Units by mouth every 7 (seven) days. Patient takes on Saturday   No current facility-administered medications for this visit. (Other)      REVIEW OF SYSTEMS:    ALLERGIES Allergies  Allergen Reactions  . Beta Adrenergic Blockers Other (See Comments)    RED EYES AND CONGESTION  . Hydrocodone   . Penicillins Hives, Itching and Swelling  . Sulfa Antibiotics Hives    RED EYES  . Thimerosal Hives and Itching    PAST MEDICAL HISTORY Past Medical History:  Diagnosis Date  . Anemia   . Anxiety   . Arthritis    knee   . Asthma   . Back pain   . Depression   . Diabetes ([place])   . DVT (deep venous thrombosis) ([place])   . Dysfunctional uterine bleeding   . Dyspnea   . GERD (gastroesophageal reflux disease)   . Glaucoma   . Headache(784.0)    MIGRAINES IN PAST  . Hypertension   . Hypothyroidism   . Joint pain   . Lactose intolerance   . Leg edema   . Morbid obesity ([place])   . Multiple food allergies   . Optic neuropathy   . Osteoarthritis   . PE (pulmonary embolism)   . Sleep apnea, obstructive    USES C-PAP   Past Surgical History:  Procedure Laterality Date  . CAPSULOTOMY  05/03/2012   Procedure:  MINOR CAPSULOTOMY;  Surgeon: Myrtha Mantis., MD;  Location: [facility];  Service: Ophthalmology;  Laterality: Right;  . CAPSULOTOMY  05/05/2012   Procedure: MINOR CAPSULOTOMY;  Surgeon: Myrtha Mantis., MD;  Location: [facility];  Service: Ophthalmology;  Laterality: Right;  . CATARACT SURG    . DILATION AND CURETTAGE OF UTERUS    . ENDOMETRIAL ABLATION     failure   .  EYE SURGERY  11/16/2009   FOR GLAUCOMA AND LASER SURGERY  X2  . GASTRIC BYPASS  [id number]  . GLAUCOMA SURGERY  10/31/2009  . HERNIA REPAIR    . HIATAL HERNIA REPAIR  01/30/2014   Procedure: HERNIA REPAIR HIATAL;  Surgeon: Pedro Earls, MD;  Location: [facility];  Service: General;;  . HYSTEROSCOPY    . IVC FILTER     2010 - THEN REMOVED THEN PLACED AGAIN 2012  . JOINT REPLACEMENT    . KNEE ARTHROSCOPY  12/2007  . LAPAROSCOPIC GASTRIC BANDING N/A 01/30/2014   Procedure: LAPAROSCOPIC GASTRIC BANDING over bypass ;  Surgeon: Pedro Earls, MD;  Location: [facility];  Service: General;  Laterality: N/A;  . POSTERIOR LAMINECTOMY / DECOMPRESSION CERVICAL SPINE  01/26/2011  . TOTAL KNEE ARTHROPLASTY  09/09/2011   Procedure: TOTAL KNEE ARTHROPLASTY;  Surgeon: Johnn Hai, MD;  Location: [facility];  Service: Orthopedics;  Laterality: Left;  . TOTAL KNEE ARTHROPLASTY Right 03/22/2014   Procedure: RIGHT TOTAL KNEE ARTHROPLASTY;  Surgeon: Johnn Hai, MD;  Location: [facility];  Service: Orthopedics;  Laterality: Right;  . UPPER GI ENDOSCOPY N/A 01/30/2014   Procedure: UPPER GI ENDOSCOPY;  Surgeon: Pedro Earls, MD;  Location: [facility];  Service: General;  Laterality: N/A;  . YAG LASER APPLICATION  8/67/6195   Procedure: YAG LASER APPLICATION;  Surgeon: Myrtha Mantis., MD;  Location: [facility];  Service: Ophthalmology;  Laterality: Right;  . YAG LASER APPLICATION  0/93/2671   Procedure: YAG LASER APPLICATION;  Surgeon: Myrtha Mantis., MD;  Location: [facility];  Service: Ophthalmology;  Laterality: Right;    FAMILY  HISTORY Family History  Problem Relation Age of Onset  . Arthritis Mother   . Hypertension Mother   . Diabetes Mother   . Stroke Mother   . Obesity Mother   . Arthritis Father   . Diabetes Father   . Alcohol abuse Father   . Hypertension Father   . Sudden death Father   . Glaucoma Other   . Cerebral aneurysm Other   . Stroke Other     SOCIAL HISTORY Social History   Tobacco Use  . Smoking status: Never Smoker  . Smokeless tobacco: Never Used  Vaping Use  . Vaping Use: Never used  Substance Use Topics  . Alcohol use: No  . Drug use: No         OPHTHALMIC EXAM:  Base Eye Exam    Visual Acuity (ETDRS)      Right Left   Dist [place] 20/80 20/30 -2   Dist ph [place] 20/25 20/25 +1       Tonometry (Tonopen, 4:00 PM)      Right Left   Pressure 15 17       Pupils      Dark Light Shape React APD   Right 5 5 Round Minimal None   Left 4 4 Round Minimal None       Visual Fields (Counting fingers)      Left Right    Full    Restrictions  Partial outer inferior temporal, inferior nasal deficiencies       Extraocular Movement      Right Left    Full Full       Neuro/Psych    Oriented x3: Yes   Mood/Affect: Normal       Dilation    Left eye: 1.0% Mydriacyl, 2.5% Phenylephrine @ 4:00 PM  Slit Lamp and Fundus Exam    External Exam      Right Left   External Normal Normal       Slit Lamp Exam      Right Left   Lids/Lashes Normal Normal   Conjunctiva/Sclera White and quiet White and quiet   Cornea Clear Clear   Anterior Chamber Deep and quiet Deep and quiet   Iris Round and reactive Round and reactive   Lens Posterior chamber intraocular lens Posterior chamber intraocular lens   Anterior Vitreous Normal Normal       Fundus Exam      Right Left   Posterior Vitreous  Vitrectomized, , Vitreous hemorrhage 3+   Disc  Advanced cupping, still pink   C/D Ratio  0.85   Macula  Microaneurysms, no macular thickening   Vessels  Old central retinal vein  occlusion, collateralized   Periphery  Laser scars          IMAGING AND PROCEDURES  Imaging and Procedures for 11/16/19  Color Fundus Photography Optos - OU - Both Eyes       Right Eye Progression has been stable. Disc findings include increased cup to disc ratio, pallor. Macula : normal observations. Vessels : normal observations. Periphery : normal observations.   Left Eye Progression has worsened. Disc findings include increased cup to disc ratio, pallor.   Notes Intravitreal hemorrhage left eye secondary to hemispheric central retinal vein occlusion, obvious neovascularization.  Will commence with intravitreal Avastin today       Intravitreal Injection, Pharmacologic Agent - OS - Left Eye       Time Out 11/16/2019. 4:17 PM. Confirmed correct patient, procedure, site, and patient consented.   Anesthesia Topical anesthesia was used. Anesthetic medications included [place] 3.5%.   Procedure Preparation included Tobramycin 0.3%, 10% betadine to eyelids, 5% betadine to ocular surface. A 30 gauge needle was used.   Injection:  1.25 mg Bevacizumab (AVASTIN) SOLN   NDC: [address]-4, Lot: [id number]   Route: Intravitreal, Site: Left Eye, Waste: 0 mg  Post-op Post injection exam found visual acuity of at least counting fingers. The patient tolerated the procedure well. There were no complications. The patient received written and verbal post procedure care education. Post injection medications were not given.                 ASSESSMENT/PLAN:  No problem-specific Assessment & Plan notes found for this encounter.      ICD-10-CM   1. Hemispheric retinal vein occlusion with macular edema of left eye  H34.8120 Color Fundus Photography Optos - OU - Both Eyes    Intravitreal Injection, Pharmacologic Agent - OS - Left Eye    Bevacizumab (AVASTIN) SOLN 1.25 mg    CANCELED: OCT, Retina - OU - Both Eyes    1.  Hemispheric central retinal vein occlusion with macular edema  left eye with vitreous hemorrhage thus neovascularization present.  Plan is delivery of intravitreal Avastin OS today followed thereafter with vitreous hemorrhage clearing, delivery of PRP for permanent stabilization  2.  3.  Ophthalmic Meds Ordered this visit:  Meds ordered this encounter  Medications  . Bevacizumab (AVASTIN) SOLN 1.25 mg       Return in about 3 weeks (around 12/07/2019) for COLOR FP, OS, PRP.  There are no Patient Instructions on file for this visit.   Explained the diagnoses, plan, and follow up with the patient and they expressed understanding.  Patient expressed understanding of the importance of proper follow  up care.   Clent Demark Dewel Lotter M.D. Diseases & Surgery of the Retina and Vitreous [facility]ina [facility] Di[facility] [place] 11/16/19     Abbreviations: M myopia (nearsighted); A astigmatism; H hyperopia (farsighted); P presbyopia; Mrx spectacle prescription;  CTL contact lenses; OD right eye; OS left eye; OU both eyes  XT exotropia; ET esotropia; PEK punctate epithelial keratitis; PEE punctate epithelial erosions; DES dry eye syndrome; MGD meibomian gland dysfunction; ATs artificial tears; PFAT's preservative free artificial tears; [place] nuclear sclerotic cataract; PSC posterior subcapsular cataract; ERM epi-retinal membrane; PVD posterior vitreous detachment; RD retinal detachment; DM diabetes mellitus; DR diabetic retinopathy; NPDR non-proliferative diabetic retinopathy; PDR proliferative diabetic retinopathy; CSME clinically significant macular edema; DME diabetic macular edema; dbh dot blot hemorrhages; CWS cotton wool spot; POAG primary open angle glaucoma; C/D cup-to-disc ratio; HVF humphrey visual field; GVF goldmann visual field; OCT optical coherence tomography; IOP intraocular pressure; BRVO Branch retinal vein occlusion; CRVO central retinal vein occlusion; CRAO central retinal artery occlusion; BRAO branch retinal artery occlusion; RT retinal tear; SB scleral buckle;  PPV pars plana vitrectomy; VH Vitreous hemorrhage; PRP panretinal laser photocoagulation; IVK intravitreal kenalog; VMT vitreomacular traction; MH Macular hole;  NVD neovascularization of the disc; NVE neovascularization elsewhere; AREDS age related eye disease study; ARMD age related macular degeneration; POAG primary open angle glaucoma; EBMD epithelial/anterior basement membrane dystrophy; ACIOL anterior chamber intraocular lens; IOL intraocular lens; PCIOL posterior chamber intraocular lens; Phaco/IOL phacoemulsification with intraocular lens placement; [place] photorefractive keratectomy; LASIK laser assisted in situ keratomileusis; HTN hypertension; DM diabetes mellitus; COPD chronic obstructive pulmonary disease

## 2019-11-23 ENCOUNTER — Encounter: Payer: BC Managed Care – PPO | Admitting: Physical Therapy

## 2019-12-05 ENCOUNTER — Encounter (INDEPENDENT_AMBULATORY_CARE_PROVIDER_SITE_OTHER): Payer: BC Managed Care – PPO | Admitting: Ophthalmology

## 2019-12-06 ENCOUNTER — Encounter (INDEPENDENT_AMBULATORY_CARE_PROVIDER_SITE_OTHER): Payer: BC Managed Care – PPO | Admitting: Ophthalmology

## 2019-12-07 ENCOUNTER — Encounter (INDEPENDENT_AMBULATORY_CARE_PROVIDER_SITE_OTHER): Payer: Self-pay | Admitting: Ophthalmology

## 2019-12-07 ENCOUNTER — Ambulatory Visit (INDEPENDENT_AMBULATORY_CARE_PROVIDER_SITE_OTHER): Payer: BC Managed Care – PPO | Admitting: Ophthalmology

## 2019-12-07 ENCOUNTER — Other Ambulatory Visit: Payer: Self-pay

## 2019-12-07 DIAGNOSIS — H348121 Central retinal vein occlusion, left eye, with retinal neovascularization: Secondary | ICD-10-CM | POA: Diagnosis not present

## 2019-12-07 DIAGNOSIS — H34812 Central retinal vein occlusion, left eye, with macular edema: Secondary | ICD-10-CM

## 2019-12-07 NOTE — Progress Notes (Signed)
12/07/2019     CHIEF COMPLAINT Patient presents for Retina Follow Up   HISTORY OF PRESENT ILLNESS: [name] is a 59 y.o. 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  Severity is moderate.  Duration of 3 weeks.  Since onset it is stable.  I, the attending physician,  performed the HPI with the patient and updated documentation appropriately.          Comments    PRP OS. FP  Pt states vision has improved. Pt c/o floaters.       Last edited by Tilda Franco on 12/07/2019  3:09 PM. (History)      Referring physician: Lucianne Lei, MD [address] [place],  [place] [address]  HISTORICAL INFORMATION:   Selected notes from the MEDICAL RECORD NUMBER    Lab Results  Component Value Date   HGBA1C 5.5 11/04/2017     CURRENT MEDICATIONS: Current Outpatient Medications (Ophthalmic Drugs)  Medication Sig  . bimatoprost (LUMIGAN) 0.01 % SOLN 1 drop at bedtime.  . brimonidine (ALPHAGAN) 0.15 % ophthalmic solution Place 1 drop into both eyes 2 (two) times daily.   . brinzolamide (AZOPT) 1 % ophthalmic suspension Place 1 drop into both eyes 2 (two) times daily.   . COMBIGAN 0.2-0.5 % ophthalmic solution Apply 1 drop to eye 3 (three) times daily.  . Olopatadine HCl (PATADAY) 0.2 % SOLN Apply 1 drop to eye 2 (two) times daily as needed.  [place] Kitchen PAZEO 0.7 % SOLN   . prednisoLONE acetate (PRED FORTE) 1 % ophthalmic suspension Place 1 drop into the left eye 4 (four) times daily.  Marland Kitchen Propylene Glycol (SYSTANE BALANCE) 0.6 % SOLN Apply to eye.  Marland Kitchen RHOPRESSA 0.02 % SOLN Place 1 drop into the right eye in the morning.   No current facility-administered medications for this visit. (Ophthalmic Drugs)   Current Outpatient Medications (Other)  Medication Sig  . acetaminophen (TYLENOL) 500 MG tablet Take 1,000 mg by mouth every 4 (four) hours as needed for moderate pain.  . Ascorbic Acid (VITAMIN C)  1000 MG tablet Take 1,000 mg by mouth every morning.   Marland Kitchen azelastine (ASTELIN) 0.1 % nasal spray Place 2 sprays into both nostrils 2 (two) times daily as needed (nasal drainage). Use in each nostril as directed  . b complex vitamins tablet Take 1 tablet by mouth every morning.   . Calcium Carbonate-Vit D-Min (CALTRATE 600+D PLUS MINERALS PO) Take 1 tablet by mouth 3 (three) times daily.   . Cyanocobalamin (VITAMIN B-12) 2500 MCG SUBL Place 2 tablets under the tongue at bedtime.  . cyclobenzaprine (FLEXERIL) 10 MG tablet Take 10 mg by mouth at bedtime.   Marland Kitchen diltiazem (DILACOR XR) 240 MG 24 hr capsule Take 240 mg by mouth at bedtime.  . fexofenadine (ALLEGRA) 180 MG tablet Take 1 tablet by mouth daily.  . fish oil-omega-3 fatty acids 1000 MG capsule Take 1 g by mouth 2 (two) times daily.   . Fluticasone-Umeclidin-Vilant (TRELEGY ELLIPTA) 200-62.5-25 MCG/INH AEPB Inhale into the lungs daily.  . Fluticasone-Umeclidin-Vilant (TRELEGY ELLIPTA) 200-62.5-25 MCG/INH AEPB Inhale 1 puff into the lungs daily.  . furosemide (LASIX) 40 MG tablet Take 40 mg by mouth.  Marland Kitchen guaiFENesin (MUCINEX) 600 MG 12 hr tablet Take by mouth 2 (two) times daily.  Marland Kitchen ipratropium (ATROVENT HFA) 17 MCG/ACT inhaler Inhale 2 puffs  into the lungs 4 (four) times daily.  Marland Kitchen levalbuterol (XOPENEX) 1.25 MG/3ML nebulizer solution Take 1.25 mg by nebulization every 6 (six) hours as needed for wheezing or shortness of breath.   . levocetirizine (XYZAL) 2.5 MG/5ML solution Take 2.5 mg by mouth every evening.   . magnesium chloride (SLOW-MAG) 64 MG TBEC Take 1-2 tablets by mouth 2 (two) times daily. Patient takes 2 tablet every morning and 1 tablet with dinner  . montelukast (SINGULAIR) 10 MG tablet Take 10 mg by mouth at bedtime.  . Multiple Vitamin (MULTIVITAMIN) tablet Take 1 tablet by mouth 2 (two) times daily.   . niacin 500 MG tablet Take 500 mg by mouth daily with breakfast. Needs to be flush free per patient  . potassium chloride SA  (K-DUR,KLOR-CON) 20 MEQ tablet Take 20-40 mEq by mouth 2 (two) times daily. 40 meq with breakfast and 20 meq with dinner  . pyridOXINE (VITAMIN B-6) 100 MG tablet Take 100 mg by mouth every morning.   . quiNINE (QUALAQUIN) 324 MG capsule Take 324 mg by mouth at bedtime.   . Rivaroxaban (XARELTO) 20 MG TABS Take 20 mg by mouth at bedtime.   . sodium chloride (OCEAN) 0.65 % SOLN nasal spray Place 2 sprays into both nostrils 2 (two) times daily.   . Sodium Chloride-Sodium Bicarb (NETI POT SINUS WASH NA) Place into the nose 2 (two) times daily as needed.  . valsartan-hydrochlorothiazide (DIOVAN-HCT) 80-12.5 MG per tablet Take 1 tablet by mouth every morning.   . Vitamin D, Ergocalciferol, (DRISDOL) 50000 UNITS CAPS capsule Take 50,000 Units by mouth every 7 (seven) days. Patient takes on Saturday   No current facility-administered medications for this visit. (Other)      REVIEW OF SYSTEMS:    ALLERGIES Allergies  Allergen Reactions  . Beta Adrenergic Blockers Other (See Comments)    RED EYES AND CONGESTION  . Hydrocodone   . Penicillins Hives, Itching and Swelling  . Sulfa Antibiotics Hives    RED EYES  . Thimerosal Hives and Itching    PAST MEDICAL HISTORY Past Medical History:  Diagnosis Date  . Anemia   . Anxiety   . Arthritis    knee   . Asthma   . Back pain   . Depression   . Diabetes ([place])   . DVT (deep venous thrombosis) ([place])   . Dysfunctional uterine bleeding   . Dyspnea   . GERD (gastroesophageal reflux disease)   . Glaucoma   . Headache(784.0)    MIGRAINES IN PAST  . Hypertension   . Hypothyroidism   . Joint pain   . Lactose intolerance   . Leg edema   . Morbid obesity ([place])   . Multiple food allergies   . Optic neuropathy   . Osteoarthritis   . PE (pulmonary embolism)   . Sleep apnea, obstructive    USES C-PAP   Past Surgical History:  Procedure Laterality Date  . CAPSULOTOMY  05/03/2012   Procedure: MINOR CAPSULOTOMY;  Surgeon: Myrtha Mantis., MD;  Location: [facility];  Service: Ophthalmology;  Laterality: Right;  . CAPSULOTOMY  05/05/2012   Procedure: MINOR CAPSULOTOMY;  Surgeon: Myrtha Mantis., MD;  Location: [facility];  Service: Ophthalmology;  Laterality: Right;  . CATARACT SURG    . DILATION AND CURETTAGE OF UTERUS    . ENDOMETRIAL ABLATION     failure   . EYE SURGERY  11/16/2009   FOR GLAUCOMA AND LASER SURGERY  X2  . GASTRIC  BYPASS  [id number]  . GLAUCOMA SURGERY  10/31/2009  . HERNIA REPAIR    . HIATAL HERNIA REPAIR  01/30/2014   Procedure: HERNIA REPAIR HIATAL;  Surgeon: Pedro Earls, MD;  Location: [facility];  Service: General;;  . HYSTEROSCOPY    . IVC FILTER     2010 - THEN REMOVED THEN PLACED AGAIN 2012  . JOINT REPLACEMENT    . KNEE ARTHROSCOPY  12/2007  . LAPAROSCOPIC GASTRIC BANDING N/A 01/30/2014   Procedure: LAPAROSCOPIC GASTRIC BANDING over bypass ;  Surgeon: Pedro Earls, MD;  Location: [facility];  Service: General;  Laterality: N/A;  . POSTERIOR LAMINECTOMY / DECOMPRESSION CERVICAL SPINE  01/26/2011  . TOTAL KNEE ARTHROPLASTY  09/09/2011   Procedure: TOTAL KNEE ARTHROPLASTY;  Surgeon: Johnn Hai, MD;  Location: [facility];  Service: Orthopedics;  Laterality: Left;  . TOTAL KNEE ARTHROPLASTY Right 03/22/2014   Procedure: RIGHT TOTAL KNEE ARTHROPLASTY;  Surgeon: Johnn Hai, MD;  Location: [facility];  Service: Orthopedics;  Laterality: Right;  . UPPER GI ENDOSCOPY N/A 01/30/2014   Procedure: UPPER GI ENDOSCOPY;  Surgeon: Pedro Earls, MD;  Location: [facility];  Service: General;  Laterality: N/A;  . YAG LASER APPLICATION  3/38/2505   Procedure: YAG LASER APPLICATION;  Surgeon: Myrtha Mantis., MD;  Location: [facility];  Service: Ophthalmology;  Laterality: Right;  . YAG LASER APPLICATION  3/97/6734   Procedure: YAG LASER APPLICATION;  Surgeon: Myrtha Mantis., MD;  Location: [facility];  Service: Ophthalmology;  Laterality: Right;    FAMILY HISTORY Family History  Problem  Relation Age of Onset  . Arthritis Mother   . Hypertension Mother   . Diabetes Mother   . Stroke Mother   . Obesity Mother   . Arthritis Father   . Diabetes Father   . Alcohol abuse Father   . Hypertension Father   . Sudden death Father   . Glaucoma Other   . Cerebral aneurysm Other   . Stroke Other     SOCIAL HISTORY Social History   Tobacco Use  . Smoking status: Never Smoker  . Smokeless tobacco: Never Used  Vaping Use  . Vaping Use: Never used  Substance Use Topics  . Alcohol use: No  . Drug use: No         OPHTHALMIC EXAM:  Base Eye Exam    Visual Acuity (Snellen - Linear)      Right Left   Dist [place] 20/200 20/25   Dist ph [place] 20/25 -1        Tonometry (Tonopen, 3:15 PM)      Right Left   Pressure 17 17       Neuro/Psych    Oriented x3: Yes   Mood/Affect: Normal       Dilation    Left eye: 1.0% Mydriacyl, 2.5% Phenylephrine @ 3:15 PM        Slit Lamp and Fundus Exam    External Exam      Right Left   External Normal Normal       Slit Lamp Exam      Right Left   Lids/Lashes Normal Normal   Conjunctiva/Sclera White and quiet White and quiet   Cornea Clear Clear   Anterior Chamber Deep and quiet Deep and quiet   Iris Round and reactive Round and reactive   Lens Posterior chamber intraocular lens Posterior chamber intraocular lens   Anterior Vitreous Normal Normal       Fundus  Exam      Right Left   Posterior Vitreous  Vitrectomized, , Vitreous hemorrhage 3+   Disc  Advanced cupping, still pink   C/D Ratio  0.85   Macula  Microaneurysms, no macular thickening   Vessels  Old central retinal vein occlusion, collateralized   Periphery  Laser scars          IMAGING AND PROCEDURES  Imaging and Procedures for 12/07/19  Color Fundus Photography Optos - OS - Left Eye       Progression has been stable. Disc findings include normal observations. Macula : normal observations. Periphery : neovascularization.   Notes Old superior  hemispheric CRV O ischemic, with room for PRP superior and nasal to the nerve       Panretinal Photocoagulation - OS - Left Eye       Time Out Confirmed correct patient, procedure, site, and patient consented.   Anesthesia Topical anesthesia was used. Anesthetic medications included Proparacaine 0.5%.   Laser Information The type of laser was diode. Color was yellow. The duration in seconds was 0.02. The spot size was 390 microns. Laser power was 240. Total spots was 343.   Post-op The patient tolerated the procedure well. There were no complications. The patient received written and verbal post procedure care education.   Notes PRP delivered superior and nasal to the nerve                ASSESSMENT/PLAN:  No problem-specific Assessment & Plan notes found for this encounter.      ICD-10-CM   1. 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  2.  3.  Ophthalmic Meds Ordered this visit:  No orders of the defined types were placed in this encounter.      Return in about 4 months (around 04/07/2020) for DILATE OU, COLOR FP.  Patient Instructions  Patient to report any new change in quality of vision or decline in vision    Explained the diagnoses, plan, and follow up with the patient and they expressed understanding.  Patient expressed understanding of the importance of proper follow up care.   Clent Demark Kashia Brossard M.D. Diseases & Surgery of the Retina and Vitreous [facility] [place] 12/07/19     Abbreviations: M myopia (nearsighted); A astigmatism; H hyperopia (farsighted); P presbyopia; Mrx spectacle prescription;  CTL contact lenses; OD right eye; OS left eye; OU both eyes  XT exotropia; ET esotropia; PEK punctate epithelial keratitis; PEE  punctate epithelial erosions; DES dry eye syndrome; MGD meibomian gland dysfunction; ATs artificial tears; PFAT's preservative free artificial tears; [place] nuclear sclerotic cataract; PSC posterior subcapsular cataract; ERM epi-retinal membrane; PVD posterior vitreous detachment; RD retinal detachment; DM diabetes mellitus; DR diabetic retinopathy; NPDR non-proliferative diabetic retinopathy; PDR proliferative diabetic retinopathy; CSME clinically significant macular edema; DME diabetic macular edema; dbh dot blot hemorrhages; CWS cotton wool spot; POAG primary open angle glaucoma; C/D cup-to-disc ratio; HVF humphrey visual field; GVF goldmann visual field; OCT optical coherence tomography; IOP intraocular pressure; BRVO Branch retinal vein occlusion; CRVO central retinal vein occlusion; CRAO central retinal artery occlusion; BRAO branch retinal artery occlusion; RT retinal tear; SB scleral buckle; PPV pars plana vitrectomy; VH Vitreous hemorrhage; PRP panretinal laser photocoagulation; IVK intravitreal kenalog; VMT vitreomacular traction; MH Macular hole;  NVD neovascularization of the disc; NVE neovascularization elsewhere; AREDS age related eye disease study; ARMD age related macular degeneration; POAG primary open angle glaucoma; EBMD epithelial/anterior basement membrane dystrophy; ACIOL anterior chamber intraocular lens; IOL intraocular lens; PCIOL posterior chamber intraocular lens; Phaco/IOL phacoemulsification with intraocular lens placement; [place] photorefractive keratectomy; LASIK laser assisted in situ keratomileusis; HTN hypertension; DM diabetes mellitus; COPD chronic obstructive pulmonary disease

## 2019-12-07 NOTE — Patient Instructions (Signed)
Patient to report any new change in quality of vision or decline in vision

## 2019-12-29 ENCOUNTER — Other Ambulatory Visit: Payer: Self-pay | Admitting: Pulmonary Disease

## 2020-01-03 ENCOUNTER — Encounter: Payer: Self-pay | Admitting: Pulmonary Disease

## 2020-01-03 ENCOUNTER — Ambulatory Visit (INDEPENDENT_AMBULATORY_CARE_PROVIDER_SITE_OTHER): Payer: BC Managed Care – PPO | Admitting: Pulmonary Disease

## 2020-01-03 ENCOUNTER — Other Ambulatory Visit: Payer: Self-pay

## 2020-01-03 DIAGNOSIS — K219 Gastro-esophageal reflux disease without esophagitis: Secondary | ICD-10-CM | POA: Diagnosis not present

## 2020-01-03 DIAGNOSIS — J4540 Moderate persistent asthma, uncomplicated: Secondary | ICD-10-CM | POA: Diagnosis not present

## 2020-01-03 LAB — CBC WITH DIFFERENTIAL/PLATELET
Basophils Absolute: 0 10*3/uL (ref 0.0–0.1)
Basophils Relative: 0.8 % (ref 0.0–3.0)
Eosinophils Absolute: 0.7 10*3/uL (ref 0.0–0.7)
Eosinophils Relative: 13.2 % — ABNORMAL HIGH (ref 0.0–5.0)
HCT: 37.6 % (ref 36.0–46.0)
Hemoglobin: 12.3 g/dL (ref 12.0–15.0)
Lymphocytes Relative: 40.9 % (ref 12.0–46.0)
Lymphs Abs: 2.1 10*3/uL (ref 0.7–4.0)
MCHC: 32.7 g/dL (ref 30.0–36.0)
MCV: 81.3 fl (ref 78.0–100.0)
Monocytes Absolute: 0.4 10*3/uL (ref 0.1–1.0)
Monocytes Relative: 8.2 % (ref 3.0–12.0)
Neutro Abs: 1.9 10*3/uL (ref 1.4–7.7)
Neutrophils Relative %: 36.9 % — ABNORMAL LOW (ref 43.0–77.0)
Platelets: 148 10*3/uL — ABNORMAL LOW (ref 150.0–400.0)
RBC: 4.63 Mil/uL (ref 3.87–5.11)
RDW: 15.7 % — ABNORMAL HIGH (ref 11.5–15.5)
WBC: 5.1 10*3/uL (ref 4.0–10.5)

## 2020-01-03 NOTE — Assessment & Plan Note (Signed)
Better controlled now after gastric band pressure was decreased

## 2020-01-03 NOTE — Progress Notes (Signed)
   Subjective:    Patient ID: [name], female    DOB: Mar 17, 1961, 59 y.o.   MRN: [id number]  HPI  59 year old never smoker for FU of asthma. She reports diagnosis of asthma around age 16 when she had an episode of VTE. She denies childhood history of asthma. Since then she has undergone allergy evaluation by Dr. Eli Hose and was on allergy shots for a period of time until she discontinued. Triggers include weather changes especially spring and fall , allergies and GERD.  She had worsening asthma symptoms for 6 months until she had a urgent care visit 07/2019, wheezing improved with prednisone, on her last visit it was felt that GERD was increasing her symptoms  She went back to her surgeon and fluid was withdrawn from gastric band since this was too tight, since then her symptoms are much improved.  She has not required any more prednisone tapers.  She reports multiple allergies and has seen the allergist Dr. Nelva Bush She has a rescue inhaler but is really does not use this much.  She uses neb but this causes palpitations  Medications-Xyzal caused increase glaucoma, fexofenadine distorts vision , she has tried Advair initially and then Symbicort which works better. Allergist changed to Trelegy. Uses Xopenex MDI for breakthrough and albuterol nebs as needed. 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 Pleasant, obese, in no distress ENT - no lesions, no post nasal drip Neck: No JVD, no thyromegaly, no carotid bruits Lungs: no use of accessory muscles, no dullness to percussion, decreased without rales or rhonchi  Cardiovascular: Rhythm regular, heart sounds  normal, no murmurs or gallops, no peripheral edema Musculoskeletal: No deformities, no cyanosis or clubbing , no tremors       Assessment & Plan:

## 2020-01-03 NOTE — Patient Instructions (Signed)
Blood work today -CBC plus differential and RAST FeNO testing  Stay on Trelegy. Okay to take rescue inhaler 2 puffs every 6 hours as needed if you have wheezing

## 2020-01-03 NOTE — Assessment & Plan Note (Signed)
Overall seems better controlled, do feel that GI triggers was most important here and this is improved after relieving pressure on her gastric band  She will continue on Trelegy for now We will try to define phenotype better with FeNO and RAST testing   Stay on Trelegy. Okay to take rescue inhaler 2 puffs every 6 hours as needed if you have wheezing

## 2020-01-08 ENCOUNTER — Telehealth: Payer: Self-pay | Admitting: Pulmonary Disease

## 2020-01-08 LAB — ALLERGEN PANEL (27) + IGE
Alternaria Alternata IgE: <0.1 kU/L
Aspergillus Fumigatus IgE: <0.1 kU/L
Bahia Grass IgE: <0.1 kU/L
Bermuda Grass IgE: 0.26 kU/L — AB
Cat Dander IgE: <0.1 kU/L
Cedar, Mountain IgE: 0.23 kU/L — AB
Cladosporium Herbarum IgE: <0.1 kU/L
Cocklebur IgE: 0.47 kU/L — AB
Cockroach, American IgE: 0.57 kU/L — AB
Common Silver Birch IgE: <0.1 kU/L
D Farinae IgE: 0.83 kU/L — AB
D Pteronyssinus IgE: 0.87 kU/L — AB
Dog Dander IgE: 0.23 kU/L — AB
Elm, American IgE: <0.1 kU/L
Hickory, White IgE: <0.1 kU/L
IgE (Immunoglobulin E), Serum: 482 IU/mL (ref 6–495)
Johnson Grass IgE: <0.1 kU/L
Kentucky Bluegrass IgE: 2.39 kU/L — AB
Maple/Box Elder IgE: 0.19 kU/L — AB
Mucor Racemosus IgE: <0.1 kU/L
Oak, White IgE: 0.16 kU/L — AB
Penicillium Chrysogen IgE: <0.1 kU/L
Pigweed, Rough IgE: <0.1 kU/L
Plantain, English IgE: 0.22 kU/L — AB
Ragweed, Short IgE: 0.57 kU/L — AB
Setomelanomma Rostrat: <0.1 kU/L
Timothy Grass IgE: 2.65 kU/L — AB
White Mulberry IgE: <0.1 kU/L

## 2020-01-08 NOTE — Telephone Encounter (Signed)
Spoke with the pt  She states needing handicap placard form  She states Dr Criss Rosales had signed one for her before   She wants Dr Elsworth Soho to do this one since he is seeing her for her asthma  Please advise if you are okay with this, thanks!

## 2020-01-08 NOTE — Telephone Encounter (Signed)
Defer to PCP since asthma in itself is not a qualifying diagnosis for handicap placard. May be with her other medical problems, she will qualify

## 2020-01-09 NOTE — Telephone Encounter (Signed)
Pt returning a phone call. Pt can be reached at [phone].

## 2020-01-09 NOTE — Telephone Encounter (Signed)
Called and spoke with pt letting her know the info stated by Dr. Elsworth Soho and she verbalized understanding. Nothing further needed.

## 2020-01-09 NOTE — Telephone Encounter (Signed)
LMTCB x 1 

## 2020-01-09 NOTE — Progress Notes (Signed)
Called and spoke with patient about lab results per Dr Elsworth Soho. All questions answered and patient expressed full understanding. Nothing further needed at this time.

## 2020-02-01 ENCOUNTER — Ambulatory Visit: Payer: BC Managed Care – PPO | Admitting: Allergy

## 2020-02-01 ENCOUNTER — Encounter: Payer: Self-pay | Admitting: Allergy

## 2020-02-01 ENCOUNTER — Other Ambulatory Visit: Payer: Self-pay

## 2020-02-01 VITALS — BP 126/98 | Resp 18

## 2020-02-01 DIAGNOSIS — K219 Gastro-esophageal reflux disease without esophagitis: Secondary | ICD-10-CM

## 2020-02-01 DIAGNOSIS — H1013 Acute atopic conjunctivitis, bilateral: Secondary | ICD-10-CM

## 2020-02-01 DIAGNOSIS — J4540 Moderate persistent asthma, uncomplicated: Secondary | ICD-10-CM

## 2020-02-01 DIAGNOSIS — J3089 Other allergic rhinitis: Secondary | ICD-10-CM

## 2020-02-01 MED ORDER — OMEPRAZOLE 20 MG PO CPDR: 20.0000 mg | DELAYED_RELEASE_CAPSULE | Freq: Every day | ORAL | 5 refills | Status: DC

## 2020-02-01 NOTE — Patient Instructions (Addendum)
Asthma - continue Trelegy 25mcg 1 inhalation once a day.  Rinse mouth after use - continue Singulair 10mg  daily - have access to Xopenex inhaler 2 puffs every 4-6 hours as needed for cough/wheeze/shortness of breath/chest tightness.  May use 15-20 minutes prior to activity.   Monitor frequency of use.    Asthma control goals:   Full participation in all desired activities (may need albuterol before activity)  Albuterol use two time or less a week on average (not counting use with activity)  Cough interfering with sleep two time or less a month  Oral steroids no more than once a year  No hospitalizations  Allergies  - continue your allergen avoidance measures  - continue use of Fexofenadine 180mg  daily as needed  - singulair as above helps also with allergy symptom control  - for itchy/watery eyes can continue use of as needed [place] or [place]  - for nasal congestion/drainage can use of Nasonex 1-2 sprays each nostril daily as needed  - for nasal drainage continue nasal antihistamine, Astelin 2 sprays each nostril 2 times a day for symptom control  Reflux  - reflux symptoms can be triggers of asthma symptoms  - you have had adverse symptoms with Famotidine use  - proton pump inhibitors like Nexium, Prilosec, Dexilant are meant for short-term use which can still be beneficial short-term  Follow-up 3-4 months or sooner if needed

## 2020-02-01 NOTE — Progress Notes (Signed)
Follow-up Note  RE: [name] MRN: [id number] DOB: 1961/01/07 Date of Office Visit: 02/01/2020   History of present illness: [name] is a 59 y.o. female presenting today for follow-up of asthma, allergic rhinitis with conjunctivitis and reflux. She was last seen in the office on 09/28/2019 by myself. At the visit I had her change to Trelegy to see if we could improve her asthma control. She states besides that taste is terrible she feels like it has been helpful in providing better control. She states she has not needed to use her Xopenex at all in the last couple of months. She also states she saw her bariatric surgeon who took some fluid out of her band and she states this helped as well with her reflux. She does continue to take omeprazole for reflux control. In with her asthma she also takes Singulair daily. She also follows with Dr. Elsworth Soho, in pulmonology. With her allergies she states she has been having more nasal drainage however she states that she is having to get Covid tested weekly as she is not vaccinated for her job. She states for several days after the test she has more nasal drainage and increased mucus production. She has been doing the Astelin 2 sprays twice a day she doesn't only know if it has been very helpful. She also has been taking Mucinex since Friday which she states has been helpful. She continues to take Allegra and Singulair as above. She also will use Pataday for any itchy watery eye symptoms. She is not taking any nasal steroid spray at this time.     Review of systems: Review of Systems  Constitutional: Negative.   HENT:       See HPI  Eyes: Negative.   Respiratory: Negative.   Cardiovascular: Negative.   Gastrointestinal: Negative.   Musculoskeletal: Negative.   Skin: Negative.   Neurological: Negative.     All other systems negative unless noted above in HPI  Past medical/social/surgical/family history have been reviewed and are unchanged  unless specifically indicated below.  No changes  Medication List: Current Outpatient Medications  Medication Sig Dispense Refill  . acetaminophen (TYLENOL) 500 MG tablet Take 1,000 mg by mouth every 4 (four) hours as needed for moderate pain.    . Ascorbic Acid (VITAMIN C) 1000 MG tablet Take 1,000 mg by mouth every morning.     Marland Kitchen azelastine (ASTELIN) 0.1 % nasal spray Place 2 sprays into both nostrils 2 (two) times daily as needed (nasal drainage). Use in each nostril as directed 30 mL 5  . b complex vitamins tablet Take 1 tablet by mouth every morning.     . bimatoprost (LUMIGAN) 0.01 % SOLN 1 drop at bedtime.    . brimonidine (ALPHAGAN) 0.15 % ophthalmic solution Place 1 drop into both eyes 2 (two) times daily.     . brinzolamide (AZOPT) 1 % ophthalmic suspension Place 1 drop into both eyes 2 (two) times daily.     . Calcium Carbonate-Vit D-Min (CALTRATE 600+D PLUS MINERALS PO) Take 1 tablet by mouth 3 (three) times daily.     . COMBIGAN 0.2-0.5 % ophthalmic solution Apply 1 drop to eye 3 (three) times daily.    . Cyanocobalamin (VITAMIN B-12) 2500 MCG SUBL Place 2 tablets under the tongue at bedtime.    Marland Kitchen diltiazem (DILACOR XR) 240 MG 24 hr capsule Take 240 mg by mouth at bedtime.    . fexofenadine (ALLEGRA) 180 MG tablet Take 1 tablet  by mouth daily.    . fish oil-omega-3 fatty acids 1000 MG capsule Take 1 g by mouth 2 (two) times daily.     . Fluticasone-Umeclidin-Vilant (TRELEGY ELLIPTA) 200-62.5-25 MCG/INH AEPB Inhale into the lungs daily.    . furosemide (LASIX) 40 MG tablet Take 40 mg by mouth.    Marland Kitchen guaiFENesin (MUCINEX) 600 MG 12 hr tablet Take by mouth 2 (two) times daily.    Marland Kitchen levalbuterol (XOPENEX) 1.25 MG/3ML nebulizer solution Take 1.25 mg by nebulization every 6 (six) hours as needed for wheezing or shortness of breath.     . magnesium chloride (SLOW-MAG) 64 MG TBEC Take 1-2 tablets by mouth 2 (two) times daily. Patient takes 2 tablet every morning and 1 tablet with dinner     . montelukast (SINGULAIR) 10 MG tablet Take 10 mg by mouth at bedtime.    . Multiple Vitamin (MULTIVITAMIN) tablet Take 1 tablet by mouth 2 (two) times daily.     . Olopatadine HCl (PATADAY) 0.2 % SOLN Apply 1 drop to eye 2 (two) times daily as needed.    Marland Kitchen PAZEO 0.7 % SOLN     . potassium chloride SA (K-DUR,KLOR-CON) 20 MEQ tablet Take 20-40 mEq by mouth 2 (two) times daily. 40 meq with breakfast and 20 meq with dinner    . Propylene Glycol (SYSTANE BALANCE) 0.6 % SOLN Apply to eye.    . pyridOXINE (VITAMIN B-6) 100 MG tablet Take 100 mg by mouth every morning.     . quiNINE (QUALAQUIN) 324 MG capsule Take 324 mg by mouth at bedtime.     . RHOPRESSA 0.02 % SOLN Place 1 drop into the right eye in the morning.    . Rivaroxaban (XARELTO) 20 MG TABS Take 20 mg by mouth at bedtime.     . sodium chloride (OCEAN) 0.65 % SOLN nasal spray Place 2 sprays into both nostrils 2 (two) times daily.     . Sodium Chloride-Sodium Bicarb (NETI POT SINUS WASH NA) Place into the nose 2 (two) times daily as needed.    Viviana Simpler ELLIPTA 200-62.5-25 MCG/INH AEPB INHALE 1 PUFF INTO THE LUNGS DAILY 60 each 3  . valsartan-hydrochlorothiazide (DIOVAN-HCT) 80-12.5 MG per tablet Take 1 tablet by mouth every morning.     . Vitamin D, Ergocalciferol, (DRISDOL) 50000 UNITS CAPS capsule Take 50,000 Units by mouth every 7 (seven) days. Patient takes on Saturday    . omeprazole (PRILOSEC) 20 MG capsule Take 1 capsule (20 mg total) by mouth daily. 30 capsule 5   No current facility-administered medications for this visit.     Known medication allergies: Allergies  Allergen Reactions  . Hydrocodone-Acetaminophen Itching and Rash  . Beta Adrenergic Blockers Other (See Comments)    RED EYES AND CONGESTION  . Hydrocodone   . Penicillins Hives, Itching and Swelling  . Sulfa Antibiotics Hives    RED EYES  . Thimerosal Hives and Itching     Physical examination: Blood pressure (!) 126/98, resp. rate 18, last menstrual  period 07/26/2011, SpO2 97 %.  General: Alert, interactive, in no acute distress. HEENT: PERRLA, TMs pearly gray, turbinates mildly edematous without discharge, post-pharynx non erythematous. Neck: Supple without lymphadenopathy. Lungs: Clear to auscultation without wheezing, rhonchi or rales. {no increased work of breathing. CV: Normal S1, S2 without murmurs. Abdomen: Nondistended, nontender. Skin: Warm and dry, without lesions or rashes. Extremities:  No clubbing, cyanosis or edema. Neuro:   Grossly intact.  Diagnositics/Labs: Labs:  Component     Latest  Ref Rng & Units 01/03/2020  IgE (Immunoglobulin E), Serum     6 - 495 IU/mL 482  D Pteronyssinus IgE     Class II kU/L 0.87 (A)  D Farinae IgE     Class II kU/L 0.83 (A)  Cat Dander IgE     Class 0 kU/L <0.10  Dog Dander IgE     Class 0/I kU/L 0.23 (A)  [place] Grass IgE     Class 0/I kU/L 0.26 (A)  Timothy Grass IgE     Class III kU/L 2.65 (A)  [place] Bluegrass IgE     Class III kU/L 2.39 (A)  Johnson Grass IgE     Class 0 kU/L <0.10  Bahia Grass IgE     Class 0 kU/L <0.10  Cockroach, American IgE     Class II kU/L 0.57 (A)  Penicillium Chrysogen IgE     Class 0 kU/L <0.10  Cladosporium Herbarum IgE     Class 0 kU/L <0.10  Aspergillus Fumigatus IgE     Class 0 kU/L <0.10  Mucor Racemosus IgE     Class 0 kU/L <0.10  Alternaria Alternata IgE     Class 0 kU/L <0.10  Setomelanomma Rostrat     Class 0 kU/L <0.10  Oak, White IgE     Class 0/I kU/L 0.16 (A)  Elm, American IgE     Class 0 kU/L <0.10  Maple/Box Elder IgE     Class 0/I kU/L 0.19 (A)  Common Silver Wendee Copp IgE     Class 0 kU/L <0.10  Hickory, White IgE     Class 0 kU/L <0.10  White Mulberry IgE     Class 0 kU/L <0.10  Cedar, [place] IgE     Class 0/I kU/L 0.23 (A)  Ragweed, Short IgE     Class II kU/L 0.57 (A)  Plantain, English IgE     Class 0/I kU/L 0.22 (A)  Cocklebur IgE     Class I kU/L 0.47 (A)  Pigweed, Rough IgE     Class 0 kU/L  <0.10    Spirometry: FEV1: 1.15 L 55%, FVC: 1.55 L 58% predicted, ratio consistent with restrictive pattern.   Assessment and plan:   Asthma, mild persistent - continue Trelegy 260mcg 1 inhalation once a day.  Rinse mouth after use - continue Singulair 10mg  daily - have access to Xopenex inhaler 2 puffs every 4-6 hours as needed for cough/wheeze/shortness of breath/chest tightness.  May use 15-20 minutes prior to activity.   Monitor frequency of use.    Asthma control goals:   Full participation in all desired activities (may need albuterol before activity)  Albuterol use two time or less a week on average (not counting use with activity)  Cough interfering with sleep two time or less a month  Oral steroids no more than once a year  No hospitalizations  Allergic rhinitis with conjunctivitis  - continue your allergen avoidance measures  - continue use of Fexofenadine 180mg  daily as needed  - singulair as above helps also with allergy symptom control  - for itchy/watery eyes can continue use of as needed Pazeo or Pataday  - for nasal congestion/drainage can use of Nasonex 1-2 sprays each nostril daily as needed  - for nasal drainage continue nasal antihistamine, Astelin 2 sprays each nostril 2 times a day for symptom control  Reflux  - reflux symptoms can be triggers of asthma symptoms  - you have had adverse symptoms with Famotidine use  -  proton pump inhibitors like Nexium, Prilosec, Dexilant are meant for short-term use which can still be beneficial short-term  Follow-up 3-4 months or sooner if needed  I appreciate the opportunity to take part in [name]'s care. Please do not hesitate to contact me with questions.  Sincerely,   Prudy Feeler, MD Allergy/Immunology [facility]

## 2020-02-13 ENCOUNTER — Other Ambulatory Visit: Payer: Self-pay

## 2020-02-13 ENCOUNTER — Other Ambulatory Visit: Payer: Self-pay | Admitting: Allergy

## 2020-02-13 MED ORDER — MOMETASONE FUROATE 50 MCG/ACT NA SUSP: NASAL | 5 refills | Status: DC

## 2020-02-19 ENCOUNTER — Other Ambulatory Visit: Payer: Self-pay | Admitting: Neurological Surgery

## 2020-02-19 DIAGNOSIS — G959 Disease of spinal cord, unspecified: Secondary | ICD-10-CM

## 2020-02-19 DIAGNOSIS — M5412 Radiculopathy, cervical region: Secondary | ICD-10-CM

## 2020-02-28 ENCOUNTER — Telehealth: Payer: Self-pay | Admitting: Allergy

## 2020-02-28 ENCOUNTER — Telehealth: Payer: Self-pay | Admitting: Pulmonary Disease

## 2020-02-28 ENCOUNTER — Encounter: Payer: Self-pay | Admitting: *Deleted

## 2020-02-28 NOTE — Telephone Encounter (Signed)
Spoke with patient, she has been approved to get the antibody infusion for Covid, she took the covid test on 02/27/2020, she received the positive result today, 02/28/2020.  She wants to know Dr. Bari Mantis thoughts on receiving the infusion prior to her receiving it.  Dr. Elsworth Soho, please advise.  Thank you.

## 2020-02-28 NOTE — Telephone Encounter (Signed)
Patient tested positive for COVID yesterday and was recommended to ask her doctors if a monoclonal infusion treatment would be okay since she has underlying health conditions.  Please advise.

## 2020-02-28 NOTE — Telephone Encounter (Signed)
Would strongly advise her to get antibody infusion. She is high risk for worsening respiratory complications from covid

## 2020-02-28 NOTE — Telephone Encounter (Signed)
Called and spoke with the patient and advised of Dr. Jeralyn Ruths plan. Gave her the phone number for the antibody infusion and advised to schedule that very soon so that this can be scheduled to help relieve her symptoms. Sent a MyChart message of Dr. Jeralyn Ruths recommendations so that she has them to refer to. Patient verbalized understanding.

## 2020-02-28 NOTE — Telephone Encounter (Signed)
Dr. Nelva Bush would you recommend the patient go get this done?

## 2020-02-28 NOTE — Telephone Encounter (Signed)
Yes due to her medical health history I would recommend monoclonal antibody infusion if she is having mild to moderate symptoms at this time. Please provide with the contact information so that she can discuss with the infusion nurses.     Recommendations for at home Covid symptoms management:  Please continue isolation at home. If have acute worsening of symptoms please go to ER/urgent care for further evaluation. Check pulse oximetry and if below 90-92% please go to ER. The following supplements may help:  Vitamin C 500mg  twice a day and Quercetin 250-500 mg twice a day Vitamin D3 2000 - 4000 u/day B Complex vitamins Zinc 75-100 mg/day Melatonin 6-10 mg at night (the optimal dose is unknown) Aspirin 81mg /day (if no history of bleeding issues)

## 2020-02-29 ENCOUNTER — Other Ambulatory Visit (HOSPITAL_COMMUNITY): Payer: Self-pay | Admitting: Physician Assistant

## 2020-02-29 ENCOUNTER — Encounter: Payer: Self-pay | Admitting: Physician Assistant

## 2020-02-29 NOTE — Telephone Encounter (Signed)
Spoke with the pt and notified of response per Dr Elsworth Soho. She verbalized understanding and will get infusion done.

## 2020-02-29 NOTE — Progress Notes (Signed)
I connected by phone with [name] on 02/29/2020 at 4:42 PM to discuss the potential use of a new treatment for mild to moderate COVID-19 viral infection in non-hospitalized patients.  This patient is a 59 y.o. female that meets the FDA criteria for Emergency Use Authorization of COVID monoclonal antibody casirivimab/imdevimab, bamlanivimab/eteseviamb, or sotrovimab.  Has a (+) direct SARS-CoV-2 viral test result  Has mild or moderate COVID-19   Is NOT hospitalized due to COVID-19  Is within 10 days of symptom onset  Has at least one of the high risk factor(s) for progression to severe COVID-19 and/or hospitalization as defined in EUA.  Specific high risk criteria : Chronic Lung Disease   I have spoken and communicated the following to the patient or parent/caregiver regarding COVID monoclonal antibody treatment:  1. FDA has authorized the emergency use for the treatment of mild to moderate COVID-19 in adults and pediatric patients with positive results of direct SARS-CoV-2 viral testing who are 18 years of age and older weighing at least 40 kg, and who are at high risk for progressing to severe COVID-19 and/or hospitalization.  2. The significant known and potential risks and benefits of COVID monoclonal antibody, and the extent to which such potential risks and benefits are unknown.  3. Information on available alternative treatments and the risks and benefits of those alternatives, including clinical trials.  4. Patients treated with COVID monoclonal antibody should continue to self-isolate and use infection control measures (e.g., wear mask, isolate, social distance, avoid sharing personal items, clean and disinfect "high touch" surfaces, and frequent handwashing) according to CDC guidelines.   5. The patient or parent/caregiver has the option to accept or refuse COVID monoclonal antibody treatment.  After reviewing this information with the patient, the patient has agreed to  receive one of the available covid 19 monoclonal antibodies and will be provided an appropriate fact sheet prior to infusion. Konrad Felix, PA-C 02/29/2020 4:42 PM

## 2020-03-01 ENCOUNTER — Other Ambulatory Visit (HOSPITAL_COMMUNITY): Payer: Self-pay

## 2020-03-01 ENCOUNTER — Ambulatory Visit (HOSPITAL_COMMUNITY)
Admission: RE | Admit: 2020-03-01 | Discharge: 2020-03-01 | Disposition: A | Discharge status: Home / Self Care | Payer: BC Managed Care – PPO | Source: Ambulatory Visit | Attending: Pulmonary Disease | Admitting: Pulmonary Disease

## 2020-03-01 DIAGNOSIS — U071 COVID-19: Secondary | ICD-10-CM | POA: Diagnosis not present

## 2020-03-01 DIAGNOSIS — J989 Respiratory disorder, unspecified: Secondary | ICD-10-CM | POA: Insufficient documentation

## 2020-03-01 MED ORDER — EPINEPHRINE 0.3 MG/0.3ML IJ SOAJ: 0.3000 mg | Freq: Once | INTRAMUSCULAR | Status: DC | PRN

## 2020-03-01 MED ORDER — SODIUM CHLORIDE 0.9 % IV SOLN: INTRAVENOUS | Status: DC | PRN

## 2020-03-01 MED ORDER — METHYLPREDNISOLONE SODIUM SUCC 125 MG IJ SOLR: 125.0000 mg | Freq: Once | INTRAMUSCULAR | Status: DC | PRN

## 2020-03-01 MED ORDER — DIPHENHYDRAMINE HCL 50 MG/ML IJ SOLN: 50.0000 mg | Freq: Once | INTRAMUSCULAR | Status: DC | PRN

## 2020-03-01 MED ORDER — SOTROVIMAB 500 MG/8ML IV SOLN
500.0000 mg | Freq: Once | INTRAVENOUS | Status: AC
  Administered 2020-03-01: 500 mg via INTRAVENOUS

## 2020-03-01 MED ORDER — FAMOTIDINE IN NACL 20-0.9 MG/50ML-% IV SOLN: 20.0000 mg | Freq: Once | INTRAVENOUS | Status: DC | PRN

## 2020-03-01 MED ORDER — ALBUTEROL SULFATE HFA 108 (90 BASE) MCG/ACT IN AERS: 2.0000 | INHALATION_SPRAY | Freq: Once | RESPIRATORY_TRACT | Status: DC | PRN

## 2020-03-01 NOTE — Progress Notes (Signed)
Sotrovimab administered via IV.  Patient provided with medication fact sheet prior to infusion and questions answered.  Patient provided with discharge instructions at discharge and all questions answered.

## 2020-03-01 NOTE — Discharge Instructions (Signed)

## 2020-03-10 ENCOUNTER — Other Ambulatory Visit: Payer: BC Managed Care – PPO

## 2020-03-30 ENCOUNTER — Other Ambulatory Visit: Payer: Self-pay

## 2020-03-30 ENCOUNTER — Ambulatory Visit
Admission: RE | Admit: 2020-03-30 | Discharge: 2020-03-30 | Disposition: A | Discharge status: Home / Self Care | Payer: BC Managed Care – PPO | Source: Ambulatory Visit | Attending: Neurological Surgery | Admitting: Neurological Surgery

## 2020-03-30 DIAGNOSIS — G959 Disease of spinal cord, unspecified: Secondary | ICD-10-CM

## 2020-03-30 DIAGNOSIS — M5412 Radiculopathy, cervical region: Secondary | ICD-10-CM

## 2020-04-04 ENCOUNTER — Encounter (INDEPENDENT_AMBULATORY_CARE_PROVIDER_SITE_OTHER): Payer: BC Managed Care – PPO | Admitting: Ophthalmology

## 2020-04-08 ENCOUNTER — Other Ambulatory Visit: Payer: Self-pay

## 2020-04-08 ENCOUNTER — Ambulatory Visit (INDEPENDENT_AMBULATORY_CARE_PROVIDER_SITE_OTHER): Payer: BC Managed Care – PPO | Admitting: Ophthalmology

## 2020-04-08 ENCOUNTER — Encounter (INDEPENDENT_AMBULATORY_CARE_PROVIDER_SITE_OTHER): Payer: Self-pay | Admitting: Ophthalmology

## 2020-04-08 ENCOUNTER — Encounter (INDEPENDENT_AMBULATORY_CARE_PROVIDER_SITE_OTHER): Payer: BC Managed Care – PPO | Admitting: Ophthalmology

## 2020-04-08 DIAGNOSIS — H401133 Primary open-angle glaucoma, bilateral, severe stage: Secondary | ICD-10-CM | POA: Diagnosis not present

## 2020-04-08 DIAGNOSIS — H348121 Central retinal vein occlusion, left eye, with retinal neovascularization: Secondary | ICD-10-CM | POA: Diagnosis not present

## 2020-04-08 DIAGNOSIS — G4733 Obstructive sleep apnea (adult) (pediatric): Secondary | ICD-10-CM | POA: Diagnosis not present

## 2020-04-08 NOTE — Progress Notes (Signed)
04/08/2020     CHIEF COMPLAINT Patient presents for Retina Follow Up (4 MO FU OU///Pt reports blurry/hazy vision OU, no new f/f, no pain or pressure )   HISTORY OF PRESENT ILLNESS: [name] is a 59 y.o. female who presents to the clinic today for:   HPI    Retina Follow Up    Patient presents with  Other.  In both eyes.  This started 4 months ago.  Duration of 4 months. Additional comments: 4 MO FU OU   Pt reports blurry/hazy vision OU, no new f/f, no pain or pressure        Last edited by Nichola Sizer D on 04/08/2020 10:57 AM. (History)      Referring physician: Lucianne Lei, MD [address] [place],  [place] [address]  HISTORICAL INFORMATION:   Selected notes from the MEDICAL RECORD NUMBER    Lab Results  Component Value Date   HGBA1C 5.5 11/04/2017     CURRENT MEDICATIONS: Current Outpatient Medications (Ophthalmic Drugs)  Medication Sig  . bimatoprost (LUMIGAN) 0.01 % SOLN at bedtime.  . brinzolamide (AZOPT) 1 % ophthalmic suspension Place 1 drop into both eyes 3 (three) times daily.  . COMBIGAN 0.2-0.5 % ophthalmic solution Apply 1 drop to eye 3 (three) times daily.  Marland Kitchen PAZEO 0.7 % SOLN   . RHOPRESSA 0.02 % SOLN Place 1 drop into the right eye in the morning.  . brimonidine (ALPHAGAN) 0.15 % ophthalmic solution Place 1 drop into both eyes 2 (two) times daily.  (Patient not taking: Reported on 04/08/2020)  . Olopatadine HCl (PATADAY) 0.2 % SOLN Apply 1 drop to eye 2 (two) times daily as needed.  Marland Kitchen Propylene Glycol (SYSTANE BALANCE) 0.6 % SOLN Apply to eye.   No current facility-administered medications for this visit. (Ophthalmic Drugs)   Current Outpatient Medications (Other)  Medication Sig  . acetaminophen (TYLENOL) 500 MG tablet Take 1,000 mg by mouth every 4 (four) hours as needed for moderate pain.  . Ascorbic Acid (VITAMIN C) 1000 MG tablet Take 1,000 mg by mouth every morning.   Marland Kitchen azelastine (ASTELIN) 0.1 % nasal spray Place 2 sprays  into both nostrils 2 (two) times daily as needed (nasal drainage). Use in each nostril as directed  . b complex vitamins tablet Take 1 tablet by mouth every morning.   . Calcium Carbonate-Vit D-Min (CALTRATE 600+D PLUS MINERALS PO) Take 1 tablet by mouth 3 (three) times daily.   . Cyanocobalamin (VITAMIN B-12) 2500 MCG SUBL Place 2 tablets under the tongue at bedtime.  Marland Kitchen diltiazem (DILACOR XR) 240 MG 24 hr capsule Take 240 mg by mouth at bedtime.  . fexofenadine (ALLEGRA) 180 MG tablet Take 1 tablet by mouth daily.  . fish oil-omega-3 fatty acids 1000 MG capsule Take 1 g by mouth 2 (two) times daily.   . Fluticasone-Umeclidin-Vilant (TRELEGY ELLIPTA) 200-62.5-25 MCG/INH AEPB Inhale into the lungs daily.  . furosemide (LASIX) 40 MG tablet Take 40 mg by mouth.  Marland Kitchen guaiFENesin (MUCINEX) 600 MG 12 hr tablet Take by mouth 2 (two) times daily.  Marland Kitchen levalbuterol (XOPENEX) 1.25 MG/3ML nebulizer solution Take 1.25 mg by nebulization every 6 (six) hours as needed for wheezing or shortness of breath.   . magnesium chloride (SLOW-MAG) 64 MG TBEC Take 1-2 tablets by mouth 2 (two) times daily. Patient takes 2 tablet every morning and 1 tablet with dinner  . mometasone (NASONEX) 50 MCG/ACT nasal spray SHAKE LIQUID AND USE 2 SPRAYS IN  EACH NOSTRIL EVERY DAY  . mometasone (NASONEX) 50 MCG/ACT nasal spray SHAKE LQ AND U 2 SPRAYS IEN QD  . montelukast (SINGULAIR) 10 MG tablet Take 10 mg by mouth at bedtime.  . Multiple Vitamin (MULTIVITAMIN) tablet Take 1 tablet by mouth 2 (two) times daily.   Marland Kitchen omeprazole (PRILOSEC) 20 MG capsule Take 1 capsule (20 mg total) by mouth daily.  . potassium chloride SA (K-DUR,KLOR-CON) 20 MEQ tablet Take 20-40 mEq by mouth 2 (two) times daily. 40 meq with breakfast and 20 meq with dinner  . pyridOXINE (VITAMIN B-6) 100 MG tablet Take 100 mg by mouth every morning.   . quiNINE (QUALAQUIN) 324 MG capsule Take 324 mg by mouth at bedtime.   . Rivaroxaban (XARELTO) 20 MG TABS Take 20 mg by  mouth at bedtime.   . sodium chloride (OCEAN) 0.65 % SOLN nasal spray Place 2 sprays into both nostrils 2 (two) times daily.   . Sodium Chloride-Sodium Bicarb (NETI POT SINUS WASH NA) Place into the nose 2 (two) times daily as needed.  . TRELEGY ELLIPTA 200-62.5-25 MCG/INH AEPB INHALE 1 PUFF INTO THE LUNGS DAILY  . valsartan-hydrochlorothiazide (DIOVAN-HCT) 80-12.5 MG per tablet Take 1 tablet by mouth every morning.   . Vitamin D, Ergocalciferol, (DRISDOL) 50000 UNITS CAPS capsule Take 50,000 Units by mouth every 7 (seven) days. Patient takes on Saturday   No current facility-administered medications for this visit. (Other)      REVIEW OF SYSTEMS:    ALLERGIES Allergies  Allergen Reactions  . Hydrocodone-Acetaminophen Itching and Rash  . Beta Adrenergic Blockers Other (See Comments)    RED EYES AND CONGESTION  . Hydrocodone   . Penicillins Hives, Itching and Swelling  . Sulfa Antibiotics Hives    RED EYES  . Thimerosal Hives and Itching    PAST MEDICAL HISTORY Past Medical History:  Diagnosis Date  . Anemia   . Anxiety   . Arthritis    knee   . Asthma   . Back pain   . Depression   . Diabetes ([place])   . DVT (deep venous thrombosis) ([place])   . Dysfunctional uterine bleeding   . Dyspnea   . GERD (gastroesophageal reflux disease)   . Glaucoma   . Headache(784.0)    MIGRAINES IN PAST  . Hypertension   . Hypothyroidism   . Joint pain   . Lactose intolerance   . Leg edema   . Morbid obesity ([place])   . Multiple food allergies   . Optic neuropathy   . Osteoarthritis   . PE (pulmonary embolism)   . Sleep apnea, obstructive    USES C-PAP   Past Surgical History:  Procedure Laterality Date  . CAPSULOTOMY  05/03/2012   Procedure: MINOR CAPSULOTOMY;  Surgeon: Myrtha Mantis., MD;  Location: [facility];  Service: Ophthalmology;  Laterality: Right;  . CAPSULOTOMY  05/05/2012   Procedure: MINOR CAPSULOTOMY;  Surgeon: Myrtha Mantis., MD;  Location: [facility];   Service: Ophthalmology;  Laterality: Right;  . CATARACT SURG    . DILATION AND CURETTAGE OF UTERUS    . ENDOMETRIAL ABLATION     failure   . EYE SURGERY  11/16/2009   FOR GLAUCOMA AND LASER SURGERY  X2  . GASTRIC BYPASS  [id number]  . GLAUCOMA SURGERY  10/31/2009  . HERNIA REPAIR    . HIATAL HERNIA REPAIR  01/30/2014   Procedure: HERNIA REPAIR HIATAL;  Surgeon: Pedro Earls, MD;  Location: [facility];  Service: General;;  .  HYSTEROSCOPY    . IVC FILTER     2010 - THEN REMOVED THEN PLACED AGAIN 2012  . JOINT REPLACEMENT    . KNEE ARTHROSCOPY  12/2007  . LAPAROSCOPIC GASTRIC BANDING N/A 01/30/2014   Procedure: LAPAROSCOPIC GASTRIC BANDING over bypass ;  Surgeon: Pedro Earls, MD;  Location: [facility];  Service: General;  Laterality: N/A;  . POSTERIOR LAMINECTOMY / DECOMPRESSION CERVICAL SPINE  01/26/2011  . TOTAL KNEE ARTHROPLASTY  09/09/2011   Procedure: TOTAL KNEE ARTHROPLASTY;  Surgeon: Johnn Hai, MD;  Location: [facility];  Service: Orthopedics;  Laterality: Left;  . TOTAL KNEE ARTHROPLASTY Right 03/22/2014   Procedure: RIGHT TOTAL KNEE ARTHROPLASTY;  Surgeon: Johnn Hai, MD;  Location: [facility];  Service: Orthopedics;  Laterality: Right;  . UPPER GI ENDOSCOPY N/A 01/30/2014   Procedure: UPPER GI ENDOSCOPY;  Surgeon: Pedro Earls, MD;  Location: [facility];  Service: General;  Laterality: N/A;  . YAG LASER APPLICATION  06/03/863   Procedure: YAG LASER APPLICATION;  Surgeon: Myrtha Mantis., MD;  Location: [facility];  Service: Ophthalmology;  Laterality: Right;  . YAG LASER APPLICATION  7/84/6962   Procedure: YAG LASER APPLICATION;  Surgeon: Myrtha Mantis., MD;  Location: [facility];  Service: Ophthalmology;  Laterality: Right;    FAMILY HISTORY Family History  Problem Relation Age of Onset  . Arthritis Mother   . Hypertension Mother   . Diabetes Mother   . Stroke Mother   . Obesity Mother   . Arthritis Father   . Diabetes Father   . Alcohol abuse Father   .  Hypertension Father   . Sudden death Father   . Glaucoma Other   . Cerebral aneurysm Other   . Stroke Other     SOCIAL HISTORY Social History   Tobacco Use  . Smoking status: Never Smoker  . Smokeless tobacco: Never Used  Vaping Use  . Vaping Use: Never used  Substance Use Topics  . Alcohol use: No  . Drug use: No         OPHTHALMIC EXAM: Base Eye Exam    Visual Acuity (ETDRS)      Right Left   Dist [place] 20/100 +1 20/25 -2   Dist ph [place] 20/30        Tonometry (Tonopen, 11:10 AM)      Right Left   Pressure 24 34       Tonometry #2 (Applanation, 11:26 AM)      Right Left   Pressure 12 11       Tonometry Comments   2 attempts OU, squinting, moving, talking about being stressed out.        Pupils      Dark Light Shape React APD   Right 5 5 Round Minimal None   Left 4 4 Round Minimal None       Visual Fields (Counting fingers)      Left Right    Full    Restrictions  Partial outer superior temporal, inferior temporal, superior nasal, inferior nasal deficiencies       Extraocular Movement      Right Left    Full Full       Neuro/Psych    Oriented x3: Yes   Mood/Affect: Normal       Dilation    Both eyes: 1.0% Mydriacyl, 2.5% Phenylephrine @ 11:10 AM        Slit Lamp and Fundus Exam    External Exam  Right Left   External Normal Normal       Slit Lamp Exam      Right Left   Lids/Lashes Normal Normal   Conjunctiva/Sclera White and quiet White and quiet   Cornea Clear Clear   Anterior Chamber Deep and quiet Deep and quiet   Iris Round and reactive Round and reactive   Lens Centered posterior chamber intraocular lens Centered posterior chamber intraocular lens   Anterior Vitreous Normal Normal       Fundus Exam      Right Left   Posterior Vitreous Normal Vitrectomized   Disc Advanced cupping, still pink, 2+ Pallor Advanced cupping, still pink   C/D Ratio 0.9 0.85   Macula Microaneurysms, no macular thickening Microaneurysms, no  macular thickening   Vessels Old central retinal vein occlusion, collateralized Old central retinal vein occlusion, collateralized   Periphery Laser scars Laser scars          IMAGING AND PROCEDURES  Imaging and Procedures for 04/08/20  Color Fundus Photography Optos - OU - Both Eyes       Right Eye Progression has been stable. Disc findings include increased cup to disc ratio.   Left Eye Progression has been stable. Disc findings include increased cup to disc ratio.   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 Quentin Ore as scheduled      ICD-10-CM   1. Primary open angle glaucoma of both eyes, severe stage  H40.1133 Color Fundus Photography Optos - OU - Both Eyes  2. Sleep apnea, obstructive  G47.33     1.  No active maculopathy or vasculopathy OU, stable OS.  2.  Advanced cupping and glaucoma, follow-up Dr. Kathlen Mody as scheduled  3.  Excellent visual acuity OU overall, intraocular pressures are stable  Ophthalmic Meds Ordered this visit:  No orders of the defined types were placed in this encounter.      Return in about 9 months (around 01/07/2021) for DILATE OU, COLOR FP, OCT.  There are no Patient Instructions on file for this visit.   Explained the diagnoses, plan, and follow up with the patient and they expressed understanding.  Patient expressed understanding of the importance of proper follow up care.   Clent Demark Lillyen Schow M.D. Diseases & Surgery of the Retina and Vitreous [facility] [place] 04/08/20     Abbreviations: M myopia (nearsighted); A astigmatism; H hyperopia (farsighted); P  presbyopia; Mrx spectacle prescription;  CTL contact lenses; OD right eye; OS left eye; OU both eyes  XT exotropia; ET esotropia; PEK punctate epithelial keratitis; PEE punctate epithelial erosions; DES dry eye syndrome; MGD meibomian gland dysfunction; ATs artificial tears; PFAT's preservative free artificial tears; [place] nuclear sclerotic cataract; PSC posterior subcapsular cataract; ERM epi-retinal membrane; PVD posterior vitreous detachment; RD retinal detachment; DM diabetes mellitus; DR diabetic retinopathy; NPDR non-proliferative diabetic retinopathy; PDR proliferative diabetic retinopathy; CSME clinically significant macular edema; DME diabetic macular edema; dbh dot blot hemorrhages; CWS cotton wool spot; POAG primary open angle glaucoma; C/D cup-to-disc ratio; HVF humphrey visual field; GVF goldmann visual field; OCT optical coherence tomography; IOP intraocular pressure;  BRVO Branch retinal vein occlusion; CRVO central retinal vein occlusion; CRAO central retinal artery occlusion; BRAO branch retinal artery occlusion; RT retinal tear; SB scleral buckle; PPV pars plana vitrectomy; VH Vitreous hemorrhage; PRP panretinal laser photocoagulation; IVK intravitreal kenalog; VMT vitreomacular traction; MH Macular hole;  NVD neovascularization of the disc; NVE neovascularization elsewhere; AREDS age related eye disease study; ARMD age related macular degeneration; POAG primary open angle glaucoma; EBMD epithelial/anterior basement membrane dystrophy; ACIOL anterior chamber intraocular lens; IOL intraocular lens; PCIOL posterior chamber intraocular lens; Phaco/IOL phacoemulsification with intraocular lens placement; [place] photorefractive keratectomy; LASIK laser assisted in situ keratomileusis; HTN hypertension; DM diabetes mellitus; COPD chronic obstructive pulmonary disease

## 2020-04-08 NOTE — Assessment & Plan Note (Signed)
No active neovascularization nor macular edema at this time.  We will continue to observe

## 2020-04-08 NOTE — Assessment & Plan Note (Signed)
Follow-up Dr. Quentin Ore as scheduled

## 2020-04-08 NOTE — Assessment & Plan Note (Signed)
Patient highly compliant with CPAP use, I encourage this to be maintained

## 2020-05-02 ENCOUNTER — Encounter: Payer: Self-pay | Admitting: Pulmonary Disease

## 2020-05-02 ENCOUNTER — Other Ambulatory Visit: Payer: Self-pay

## 2020-05-02 ENCOUNTER — Ambulatory Visit: Payer: BC Managed Care – PPO | Admitting: Pulmonary Disease

## 2020-05-02 DIAGNOSIS — K219 Gastro-esophageal reflux disease without esophagitis: Secondary | ICD-10-CM | POA: Diagnosis not present

## 2020-05-02 DIAGNOSIS — J4541 Moderate persistent asthma with (acute) exacerbation: Secondary | ICD-10-CM

## 2020-05-02 DIAGNOSIS — G4733 Obstructive sleep apnea (adult) (pediatric): Secondary | ICD-10-CM

## 2020-05-02 NOTE — Assessment & Plan Note (Signed)
Please provide chip from your sleep machine to [facility] so that we can obtain download.  This will enable [name] to review settings and compliance. Sleep study from 2014 was reviewed Based on this we can consider getting you a new machine

## 2020-05-02 NOTE — Assessment & Plan Note (Addendum)
Start back on omeprazole and use this daily for 4 weeks. We discussed other measures for GERD including head of bed elevation during sleep, small meals, no food at least 2 hours before bedtime. Last year gastric band was deflated and this seemed to help symptoms somewhat but now symptoms have recurred

## 2020-05-02 NOTE — Assessment & Plan Note (Addendum)
We will treat as acute exacerbation -trigger unclear but GERD very likely, perhaps nonacid reflux since not relieved by omeprazole for 1 week Prednisone 10 mg tabs  Take 2 tabs daily with food x 5ds, then 1 tab daily with food x 5ds then STOP Stay on Trelegy once daily, rinse mouth after use. Continue Singulair at bedtime Use Xopenex as needed for breakthrough shortness of breath

## 2020-05-02 NOTE — Progress Notes (Signed)
   Subjective:    Patient ID: [name], female    DOB: [birth year], 60 y.o.   MRN: [id number]  HPI  60 yo never smoker for FU of asthma. She reports diagnosis of asthma around age 61 when she had an episode of VTE. She denies childhood history of asthma. Since then she has undergone allergy evaluation by Dr. Eli Hose and was on allergy shots for a period of time until she discontinued. Triggers include weather changes especially spring and fall,allergies and GERD.  She had worsening asthma symptoms for 6 months until she had a urgent care visit 07/2019 and multiple prednisone tapers, symptoms improved after gastric gland was deflated by surgeon She reports multiple allergies and has seen the allergist Dr. Nelva Bush   Medications-Xyzal caused increase glaucoma, fexofenadine distorts vision,she has tried Advair initially and then Symbicort which works better. Allergist changed to Trelegy. Uses Xopenex MDI for breakthrough and albuterol nebs as needed.  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  Breathing did okay until 2 weeks ago when asthma symptoms returned, took 1 week course of omeprazole, does not report breakthrough GERD. Coughing and wheezing is worse at night. Compliant with CPAP, she wants a new machine.  PCP wanted to change Trelegy but allergist recommended staying on this.  Significant tests/ events reviewed 12/2019 RAST  IgE 482, low grade to dust mite, grass, ragweed 2019 AEC 400  UGI 07/2019 >>Poor esophageal motility with dilated esophagus and numerous tertiary contractions  01/2020 spirometry moderate restriction, ratio  74, FEV1 55%, FVC 58% 09/2019 spirometry shows moderate restriction with ratio 77, FEV1 1.21/57%, FVC 1.58/59%, no bronchodilator response  [id number] pounds-AHI 12/hour, RDI 15/hour, nasal CPAP   Review of Systems neg for any significant sore throat, dysphagia, itching, sneezing, nasal congestion or excess/ purulent secretions, fever, chills, sweats, unintended wt loss, pleuritic or exertional cp, hempoptysis, orthopnea pnd or change in chronic leg swelling. Also denies presyncope, palpitations, heartburn, abdominal pain, nausea, vomiting, diarrhea or change in bowel or urinary habits, dysuria,hematuria, rash, arthralgias, visual complaints, headache, numbness weakness or ataxia.     Objective:   Physical Exam  Gen. Pleasant, obese, in no distress ENT - no lesions, no post nasal drip Neck: No JVD, no thyromegaly, no carotid bruits Lungs: no use of accessory muscles, no dullness to percussion, decreased without rales or rhonchi  Cardiovascular: Rhythm regular, heart sounds  normal, no murmurs or gallops, no peripheral edema Musculoskeletal: No deformities, no cyanosis or clubbing , no tremors       Assessment & Plan:

## 2020-05-02 NOTE — Patient Instructions (Signed)
Prednisone 10 mg tabs  Take 2 tabs daily with food x 5ds, then 1 tab daily with food x 5ds then STOP Stay on Trelegy once daily, rinse mouth after use. Continue Singulair at bedtime Use Xopenex as needed for breakthrough shortness of breath  Start back on omeprazole and use this daily for 4 weeks. We discussed other measures for GERD including head of bed elevation during sleep, small meals, no food at least 2 hours before bedtime.  Please provide chip from your sleep machine to [facility] so that we can obtain download. Based on this we can consider getting you a new machine

## 2020-05-03 ENCOUNTER — Telehealth: Payer: Self-pay | Admitting: Pulmonary Disease

## 2020-05-03 MED ORDER — PREDNISONE 10 MG PO TABS: ORAL_TABLET | ORAL | 0 refills | Status: AC

## 2020-05-03 NOTE — Telephone Encounter (Signed)
Rx for pt's prednisone has been sent to pharmacy for pt. Attempted to call pt to let her know this had been done but unable to reach. Left pt a detailed message letting her know that this had been done. Nothing further needed.

## 2020-06-06 ENCOUNTER — Ambulatory Visit: Payer: BC Managed Care – PPO | Admitting: Allergy

## 2020-06-06 ENCOUNTER — Encounter: Payer: Self-pay | Admitting: Allergy

## 2020-06-06 ENCOUNTER — Ambulatory Visit (INDEPENDENT_AMBULATORY_CARE_PROVIDER_SITE_OTHER): Payer: BC Managed Care – PPO | Admitting: Allergy

## 2020-06-06 ENCOUNTER — Other Ambulatory Visit: Payer: Self-pay

## 2020-06-06 ENCOUNTER — Telehealth: Payer: Self-pay

## 2020-06-06 VITALS — BP 138/82 | HR 86 | Temp 98.0°F | Resp 14 | Ht 64.75 in | Wt 296.8 lb

## 2020-06-06 DIAGNOSIS — H1013 Acute atopic conjunctivitis, bilateral: Secondary | ICD-10-CM

## 2020-06-06 DIAGNOSIS — K219 Gastro-esophageal reflux disease without esophagitis: Secondary | ICD-10-CM

## 2020-06-06 DIAGNOSIS — J3089 Other allergic rhinitis: Secondary | ICD-10-CM | POA: Diagnosis not present

## 2020-06-06 DIAGNOSIS — J4540 Moderate persistent asthma, uncomplicated: Secondary | ICD-10-CM

## 2020-06-06 NOTE — Telephone Encounter (Signed)
Called to see if patient wanted to reschedule her appointment. I left a message

## 2020-06-06 NOTE — Progress Notes (Signed)
Follow-up Note  RE: [name] MRN: [id number] DOB: April 27, 1960 Date of Office Visit: 06/06/2020   History of present illness: [name] is a 60 y.o. female presenting today for follow-up of asthma, allergic rhinitis with conjunctivitis and reflux.  She was last seen in the office on February 01, 2020 by myself.  She did develop Covid in early development the patient states she was fatigued and needed to sleep a lot.  She states she did get the monoclonal antibody infusion. About a month ago she did have increase in cough and wheeze symptoms that she believes was triggered by the change in weather as well as an episode where she became cold at work.  She states her PCP recommended that she change her inhaler however she states the Trelegy has been working well for her but she did not want to change something that was working.  She did see her pulmonologist as well who did prescribe her a short course of prednisone which helped relieve her symptoms.  She did need to use her Xopenex during that time.  She states outside of this plan she does not require them to use her Xopenex at any other point since her last visit.  She continues to take Trelegy 1 puff daily as well as Singulair.  She does believe that the Trelegy has improved her overall control. She continues to take Allegra daily.  She has access to Nasonex and Astelin for use for nasal congestion and drainage symptoms.  She states however she has not needed to use these as much. It was recommended that she resume use of omeprazole daily for 6 to 8 weeks at a time.  She states she was told that she has "non-acid reflux." She has noted that one of her eyedrops, combigan, causes her to wheeze after she uses it.  She would like to maintain her vision thus she decided along with her ophthalmologist to continue use and she will "deal" with the wheeze.  Review of systems: Review of Systems  Constitutional: Negative.   HENT: Negative.    Eyes: Negative.   Respiratory: Positive for cough and wheezing.   Cardiovascular: Negative.   Gastrointestinal: Negative.   Musculoskeletal: Negative.   Skin: Negative.   Neurological: Negative.     All other systems negative unless noted above in HPI  Past medical/social/surgical/family history have been reviewed and are unchanged unless specifically indicated below.  No changes  Medication List: Current Outpatient Medications  Medication Sig Dispense Refill  . acetaminophen (TYLENOL) 500 MG tablet Take 1,000 mg by mouth every 4 (four) hours as needed for moderate pain.    . Ascorbic Acid (VITAMIN C) 1000 MG tablet Take 1,000 mg by mouth every morning.     Marland Kitchen azelastine (ASTELIN) 0.1 % nasal spray Place 2 sprays into both nostrils 2 (two) times daily as needed (nasal drainage). Use in each nostril as directed 30 mL 5  . b complex vitamins tablet Take 1 tablet by mouth every morning.     . bimatoprost (LUMIGAN) 0.01 % SOLN at bedtime.    . brinzolamide (AZOPT) 1 % ophthalmic suspension Place 1 drop into both eyes 3 (three) times daily.    . Calcium Carbonate-Vit D-Min (CALTRATE 600+D PLUS MINERALS PO) Take 1 tablet by mouth 3 (three) times daily.     . COMBIGAN 0.2-0.5 % ophthalmic solution Apply 1 drop to eye 3 (three) times daily.    . Cyanocobalamin (VITAMIN B-12) 2500 MCG SUBL Place  2 tablets under the tongue at bedtime.    Marland Kitchen diltiazem (DILACOR XR) 240 MG 24 hr capsule Take 240 mg by mouth at bedtime.    . fexofenadine (ALLEGRA) 180 MG tablet Take 1 tablet by mouth daily.    . fish oil-omega-3 fatty acids 1000 MG capsule Take 1 g by mouth 2 (two) times daily.     . furosemide (LASIX) 40 MG tablet Take 40 mg by mouth.    Marland Kitchen guaiFENesin (MUCINEX) 600 MG 12 hr tablet Take by mouth 2 (two) times daily.    Marland Kitchen levalbuterol (XOPENEX) 1.25 MG/3ML nebulizer solution Take 1.25 mg by nebulization every 6 (six) hours as needed for wheezing or shortness of breath.     . magnesium chloride  (SLOW-MAG) 64 MG TBEC Take 1-2 tablets by mouth 2 (two) times daily. Patient takes 2 tablet every morning and 1 tablet with dinner    . mometasone (NASONEX) 50 MCG/ACT nasal spray SHAKE LIQUID AND USE 2 SPRAYS IN EACH NOSTRIL EVERY DAY 17 g 5  . montelukast (SINGULAIR) 10 MG tablet Take 10 mg by mouth at bedtime.    . Multiple Vitamin (MULTIVITAMIN) tablet Take 1 tablet by mouth 2 (two) times daily.     . Olopatadine HCl 0.2 % SOLN Apply 1 drop to eye 2 (two) times daily as needed.    Marland Kitchen omeprazole (PRILOSEC) 20 MG capsule Take 1 capsule (20 mg total) by mouth daily. 30 capsule 5  . potassium chloride SA (K-DUR,KLOR-CON) 20 MEQ tablet Take 20-40 mEq by mouth 2 (two) times daily. 40 meq with breakfast and 20 meq with dinner    . quiNINE (QUALAQUIN) 324 MG capsule Take 324 mg by mouth at bedtime.     . RHOPRESSA 0.02 % SOLN Place 1 drop into the right eye in the morning.    . rivaroxaban (XARELTO) 20 MG TABS tablet Take 20 mg by mouth at bedtime.     . sodium chloride (OCEAN) 0.65 % SOLN nasal spray Place 2 sprays into both nostrils 2 (two) times daily.     . Sodium Chloride-Sodium Bicarb (NETI POT SINUS WASH NA) Place into the nose 2 (two) times daily as needed.    Viviana Simpler ELLIPTA 200-62.5-25 MCG/INH AEPB INHALE 1 PUFF INTO THE LUNGS DAILY 60 each 3  . valsartan-hydrochlorothiazide (DIOVAN-HCT) 80-12.5 MG per tablet Take 1 tablet by mouth every morning.     . Vitamin D, Ergocalciferol, (DRISDOL) 50000 UNITS CAPS capsule Take 50,000 Units by mouth every 7 (seven) days. Patient takes on Saturday    . Propylene Glycol 0.6 % SOLN Apply to eye. (Patient not taking: Reported on 06/06/2020)     No current facility-administered medications for this visit.     Known medication allergies: Allergies  Allergen Reactions  . Hydrocodone-Acetaminophen Itching and Rash  . Beta Adrenergic Blockers Other (See Comments)    RED EYES AND CONGESTION  . Hydrocodone   . Penicillins Hives, Itching and Swelling  .  Sulfa Antibiotics Hives    RED EYES  . Thimerosal Hives and Itching     Physical examination: Blood pressure 138/82, pulse 86, temperature 98 F (36.7 C), resp. rate 14, height 5' 4.75" (1.645 m), weight 296 lb 12.8 oz (134.6 kg), last menstrual period 07/26/2011, SpO2 100 %.  General: Alert, interactive, in no acute distress. HEENT: PERRLA, TMs pearly gray, turbinates minimally edematous without discharge, post-pharynx non erythematous. Neck: Supple without lymphadenopathy. Lungs: Clear to auscultation without wheezing, rhonchi or rales. {no increased work of  breathing. CV: Normal S1, S2 without murmurs. Abdomen: Nondistended, nontender. Skin: Warm and dry, without lesions or rashes. Extremities:  No clubbing, cyanosis or edema. Neuro:   Grossly intact.  Diagnositics/Labs:  Spirometry: FEV1: 1.44L 69%, FVC: 1.87L 71% predicted.  This is a improvement over her past study  Assessment and plan:   Asthma, moderate persistent - continue Trelegy 234mcg 1 inhalation once a day.  Rinse mouth after use.  Advised can try using after she uses her eyedrop to see if this will resolve the wheezing with the eyedrop use since she is planning to continue with this eyedrop. - continue Singulair 10mg  daily - have access to Xopenex inhaler 2 puffs every 4-6 hours as needed for cough/wheeze/shortness of breath/chest tightness.  May use 15-20 minutes prior to activity.   Monitor frequency of use.    Asthma control goals:   Full participation in all desired activities (may need albuterol before activity)  Albuterol use two time or less a week on average (not counting use with activity)  Cough interfering with sleep two time or less a month  Oral steroids no more than once a year  No hospitalizations  Allergic rhinitis with conjunctivitis  - continue your allergen avoidance measures  - continue use of Fexofenadine 180mg  daily   - singulair as above helps also with allergy symptom control  -  for itchy/watery eyes can continue use of as needed Pazeo or Pataday  - for nasal congestion/stuffiness use of Nasonex 1-2 sprays each nostril daily as needed  - for nasal drainage/post-nasal drip use Astelin 2 sprays each nostril 2 times a day for symptom control  Reflux  - reflux symptoms can be triggers of asthma symptoms  - you have had adverse symptoms with Famotidine use  - use Omeprazole daily for up to 6-8 weeks at a time for reflux control  Follow-up 4 months or sooner if needed  I appreciate the opportunity to take part in Rayana's care. Please do not hesitate to contact me with questions.  Sincerely,   Prudy Feeler, MD Allergy/Immunology [facility]

## 2020-06-06 NOTE — Patient Instructions (Addendum)
Asthma - continue Trelegy 253mcg 1 inhalation once a day.  Rinse mouth after use - continue Singulair 10mg  daily - have access to Xopenex inhaler 2 puffs every 4-6 hours as needed for cough/wheeze/shortness of breath/chest tightness.  May use 15-20 minutes prior to activity.   Monitor frequency of use.    Asthma control goals:   Full participation in all desired activities (may need albuterol before activity)  Albuterol use two time or less a week on average (not counting use with activity)  Cough interfering with sleep two time or less a month  Oral steroids no more than once a year  No hospitalizations  Allergies  - continue your allergen avoidance measures  - continue use of Fexofenadine 180mg  daily   - singulair as above helps also with allergy symptom control  - for itchy/watery eyes can continue use of as needed Pazeo or Pataday  - for nasal congestion/stuffiness use of Nasonex 1-2 sprays each nostril daily as needed  - for nasal drainage/post-nasal drip use Astelin 2 sprays each nostril 2 times a day for symptom control  Reflux  - reflux symptoms can be triggers of asthma symptoms  - you have had adverse symptoms with Famotidine use  - use Omeprazole daily for up to 6-8 weeks at a time for reflux control  Follow-up 4 months or sooner if needed

## 2020-06-07 ENCOUNTER — Telehealth: Payer: Self-pay | Admitting: Allergy

## 2020-06-07 NOTE — Telephone Encounter (Signed)
Okay thanks.  I did discuss Breztri with her at the office visit.  I do not think [name] will be covered at this time as it does not have indication for asthma.  However I think she would tolerate use of this better since it is a aerosol mist and not a dry powder.  If she would like to try this instead of the Trelegy she can it is 2 puffs twice a day.  We typically have samples that we can provide her if it is not covered with her insurance.

## 2020-06-07 NOTE — Telephone Encounter (Signed)
Pt called to let Dr. Nelva Bush know Dr. Eulas Post has prescribed her [facility]. Pt would like clarification on whether the new medication is the same as trelegy.   Please contact patient to advise.

## 2020-06-07 NOTE — Telephone Encounter (Signed)
Please see below message and advise on what patient should be taking.

## 2020-06-10 NOTE — Telephone Encounter (Signed)
Left patient a message about Trelegy. I let her know to call the office if she wanted to pick up a sample so we can have it waiting for her at the front desk.

## 2020-06-10 NOTE — Telephone Encounter (Signed)
Patient returned the phone call and stated she was only telling Dr. Nelva Bush what medication Dr. Junius Argyle had given her because Dr. Nelva Bush asked her to get the name of the medication that Dr. Junius Argyle had her on. She's already on Trelegy and it doesn't matter to her if it's aerosol or not. She states she's fine and she was given a script for [employer] from Dr. Junius Argyle.

## 2020-06-10 NOTE — Telephone Encounter (Signed)
Ok thanks for update

## 2020-06-12 ENCOUNTER — Other Ambulatory Visit: Payer: Self-pay | Admitting: Pulmonary Disease

## 2020-06-13 ENCOUNTER — Other Ambulatory Visit: Payer: Self-pay | Admitting: Pulmonary Disease

## 2020-07-21 ENCOUNTER — Other Ambulatory Visit: Payer: Self-pay | Admitting: Allergy

## 2020-08-24 ENCOUNTER — Other Ambulatory Visit: Payer: Self-pay | Admitting: Allergy

## 2020-08-27 ENCOUNTER — Encounter: Payer: Self-pay | Admitting: Primary Care

## 2020-08-30 ENCOUNTER — Ambulatory Visit: Payer: BC Managed Care – PPO | Admitting: Primary Care

## 2020-08-30 ENCOUNTER — Encounter: Payer: Self-pay | Admitting: Primary Care

## 2020-08-30 ENCOUNTER — Other Ambulatory Visit: Payer: Self-pay

## 2020-08-30 VITALS — BP 122/70 | HR 78 | Temp 97.2°F | Ht 64.75 in | Wt 293.2 lb

## 2020-08-30 DIAGNOSIS — J4541 Moderate persistent asthma with (acute) exacerbation: Secondary | ICD-10-CM | POA: Diagnosis not present

## 2020-08-30 DIAGNOSIS — G4733 Obstructive sleep apnea (adult) (pediatric): Secondary | ICD-10-CM | POA: Diagnosis not present

## 2020-08-30 MED ORDER — METHYLPREDNISOLONE ACETATE 80 MG/ML IJ SUSP
80.0000 mg | Freq: Once | INTRAMUSCULAR | Status: AC
  Administered 2020-08-30: 80 mg via INTRAMUSCULAR

## 2020-08-30 MED ORDER — PREDNISONE 10 MG PO TABS: ORAL_TABLET | ORAL | 0 refills | Status: AC

## 2020-08-30 NOTE — Patient Instructions (Addendum)
Recommendations: Continue Trelegy 200 one puff daily in morning (rinse mouth after use) Continue Singulair 10mg  at bedtime  Continue Omeprazole 40mg  daily before breakfast   In office treatment: Depo medrol 80mg  IM  Orders: Establish with [employer]; please service current machine and provide new supplies.   CPAP pressure 5-15cm h20  Rx: Prednisone taper as directed   Follow-up: 4 months with Dr. Elsworth Soho    Food Choices for Gastroesophageal Reflux Disease, Adult When you have gastroesophageal reflux disease (GERD), the foods you eat and your eating habits are very important. Choosing the right foods can help ease your discomfort. Think about working with a food expert (dietitian) to help you make good choices. What are tips for following this plan? Reading food labels  Look for foods that are low in saturated fat. Foods that may help with your symptoms include: ? Foods that have less than 5% of daily value (DV) of fat. ? Foods that have 0 grams of trans fat. Cooking  Do not fry your food.  Cook your food by baking, steaming, grilling, or broiling. These are all methods that do not need a lot of fat for cooking.  To add flavor, try to use herbs that are low in spice and acidity. Meal planning  Choose healthy foods that are low in fat, such as: ? Fruits and vegetables. ? Whole grains. ? Low-fat dairy products. ? Lean meats, fish, and poultry.  Eat small meals often instead of eating 3 large meals each day. Eat your meals slowly in a place where you are relaxed. Avoid bending over or lying down until 2-3 hours after eating.  Limit high-fat foods such as fatty meats or fried foods.  Limit your intake of fatty foods, such as oils, butter, and shortening.  Avoid the following as told by your doctor: ? Foods that cause symptoms. These may be different for different people. Keep a food diary to keep track of foods that cause symptoms. ? Alcohol. ? Drinking a lot of liquid with  meals. ? Eating meals during the 2-3 hours before bed.   Lifestyle  Stay at a healthy weight. Ask your doctor what weight is healthy for you. If you need to lose weight, work with your doctor to do so safely.  Exercise for at least 30 minutes on 5 or more days each week, or as told by your doctor.  Wear loose-fitting clothes.  Do not smoke or use any products that contain nicotine or tobacco. If you need help quitting, ask your doctor.  Sleep with the head of your bed higher than your feet. Use a wedge under the mattress or blocks under the bed frame to raise the head of the bed.  Chew sugar-free gum after meals. What foods should eat? Eat a healthy, well-balanced diet of fruits, vegetables, whole grains, low-fat dairy products, lean meats, fish, and poultry. Each person is different. Foods that may cause symptoms in one person may not cause any symptoms in another person. Work with your doctor to find foods that are safe for you. The items listed above may not be a complete list of what you can eat and drink. Contact a food expert for more options.   What foods should I avoid? Limiting some of these foods may help in managing the symptoms of GERD. Everyone is different. Talk with a food expert or your doctor to help you find the exact foods to avoid, if any. Fruits Any fruits prepared with added fat. Any fruits that  cause symptoms. For some people, this may include citrus fruits, such as oranges, grapefruit, pineapple, and lemons. Vegetables Deep-fried vegetables. [place] fries. Any vegetables prepared with added fat. Any vegetables that cause symptoms. For some people, this may include tomatoes and tomato products, chili peppers, onions and garlic, and horseradish. Grains Pastries or quick breads with added fat. Meats and other proteins High-fat meats, such as fatty beef or pork, hot dogs, ribs, ham, sausage, salami, and bacon. Fried meat or protein, including fried fish and fried  chicken. Nuts and nut butters, in large amounts. Dairy Whole milk and chocolate milk. Sour cream. Cream. Ice cream. Cream cheese. Milkshakes. Fats and oils Butter. Margarine. Shortening. Ghee. Beverages Coffee and tea, with or without caffeine. Carbonated beverages. Sodas. Energy drinks. Fruit juice made with acidic fruits, such as orange or grapefruit. Tomato juice. Alcoholic drinks. Sweets and desserts Chocolate and cocoa. Donuts. Seasonings and condiments Pepper. Peppermint and spearmint. Added salt. Any condiments, herbs, or seasonings that cause symptoms. For some people, this may include curry, hot sauce, or vinegar-based salad dressings. The items listed above may not be a complete list of what you should not eat and drink. Contact a food expert for more options. Questions to ask your doctor Diet and lifestyle changes are often the first steps that are taken to manage symptoms of GERD. If diet and lifestyle changes do not help, talk with your doctor about taking medicines. Where to find more information  International Foundation for Gastrointestinal Disorders: aboutgerd.org Summary  When you have GERD, food and lifestyle choices are very important in easing your symptoms.  Eat small meals often instead of 3 large meals a day. Eat your meals slowly and in a place where you are relaxed.  Avoid bending over or lying down until 2-3 hours after eating.  Limit high-fat foods such as fatty meats or fried foods. This information is not intended to replace advice given to you by your health care provider. Make sure you discuss any questions you have with your health care provider. Document Revised: 10/16/2019 Document Reviewed: 10/16/2019 Elsevier Patient Education  [place].

## 2020-08-30 NOTE — Progress Notes (Signed)
@Patient  ID: [name], female    DOB: 09-18-1960, 60 y.o.   MRN: [id number]  Chief Complaint  Patient presents with  . Follow-up    Reports intermittent wheezing    Referring provider: Lucianne Lei, MD  HPI: 60 year old female, never smoked. PMH significant for hypertension, stroke, asthma, sleep apnea, GERD, diabetes.  Patient of Dr. Elsworth Soho, last seen in office on 05/02/2020.  Previous [facility] pulmonary encounter: 05/02/20- Dr. Elsworth Soho  60 yo never smoker for FU of asthma. She reports diagnosis of asthma around age 26 when she had an episode of VTE. She denies childhood history of asthma. Since then she has undergone allergy evaluation by Dr. Eli Hose and was on allergy shots for a period of time until she discontinued. Triggers include weather changes especially spring and fall,allergies and GERD.  She had worsening asthma symptoms for 6 months until she had a urgent care visit 07/2019 and multiple prednisone tapers, symptoms improved after gastric gland was deflated by surgeon She reports multiple allergies and has seen the allergist Dr. Nelva Bush  Medications-Xyzal caused increase glaucoma, fexofenadine distorts vision,she has tried Advair initially and then Symbicort which works better. Allergist changed to Trelegy. Uses Xopenex MDI for breakthrough and albuterol nebs as needed.  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  Breathing did okay until 2 weeks ago when asthma symptoms returned, took 1 week course of omeprazole, does not report breakthrough GERD.Coughing and wheezing is worse at night. Compliant with CPAP, she wants a new machine.   PCP wanted to change Trelegy but allergist recommended staying on this.  08/30/2020- Interim hx  Patient presents today for 4 month follow-up. She complains of wheezing. She is compliant with Trelegy Ellipta 200. She noticed her wheezing was worse off PPI. She recently resumed Omeprazole 40mg  2 weeks ago with some improvement. She is not consistently compliant with CPAP. She is using full nasal mask, needs new order to re-establish with [facility]. No download available, we do not have card reader for her machine.    Significant tests/ events reviewed 12/2019 RAST  IgE 482, low grade to dust mite, grass, ragweed 2019 AEC 400  UGI 07/2019 >>Poor esophageal motility with dilated esophagus and numerous tertiary contractions  01/2020 spirometry moderate restriction, ratio 74, FEV1 55%, FVC 58% 09/2019 spirometry shows moderate restriction with ratio 77, FEV1 1.21/57%, FVC 1.58/59%, no bronchodilator response  11/2014NPSG-280 pounds-AHI 12/hour, RDI 15/hour, nasal CPAP   Allergies  Allergen Reactions  . Hydrocodone-Acetaminophen Itching and Rash  . Beta Adrenergic Blockers Other (See Comments)    RED EYES AND CONGESTION  . Hydrocodone   . Penicillins Hives, Itching and Swelling  . Sulfa Antibiotics Hives    RED EYES  . Thimerosal Hives and Itching     There is no immunization history on file for this patient.  Past Medical History:  Diagnosis Date  . Anemia   . Anxiety   . Arthritis    knee   . Asthma   . Back pain   . Depression   . Diabetes ([place])   . DVT (deep venous thrombosis) ([place])   . Dysfunctional uterine bleeding   . Dyspnea   . GERD (gastroesophageal reflux disease)   .  Glaucoma   . Headache(784.0)    MIGRAINES IN PAST  . Hypertension   . Hypothyroidism   . Joint pain   . Lactose intolerance   . Leg edema   . Morbid obesity ([place])   . Multiple food allergies   . Optic neuropathy   . Osteoarthritis   . PE (pulmonary embolism)   . Sleep apnea, obstructive     USES C-PAP    Tobacco History: Social History   Tobacco Use  Smoking Status Never Smoker  Smokeless Tobacco Never Used   Counseling given: Not Answered   Outpatient Medications Prior to Visit  Medication Sig Dispense Refill  . acetaminophen (TYLENOL) 500 MG tablet Take 1,000 mg by mouth every 4 (four) hours as needed for moderate pain.    . Ascorbic Acid (VITAMIN C) 1000 MG tablet Take 1,000 mg by mouth every morning.     Marland Kitchen azelastine (ASTELIN) 0.1 % nasal spray Place 2 sprays into both nostrils 2 (two) times daily as needed (nasal drainage). Use in each nostril as directed 30 mL 5  . b complex vitamins tablet Take 1 tablet by mouth every morning.     . bimatoprost (LUMIGAN) 0.01 % SOLN at bedtime.    . brinzolamide (AZOPT) 1 % ophthalmic suspension Place 1 drop into both eyes 3 (three) times daily.    . Calcium Carbonate-Vit D-Min (CALTRATE 600+D PLUS MINERALS PO) Take 1 tablet by mouth 3 (three) times daily.     . COMBIGAN 0.2-0.5 % ophthalmic solution Apply 1 drop to eye 3 (three) times daily.    . Cyanocobalamin (VITAMIN B-12) 2500 MCG SUBL Place 2 tablets under the tongue at bedtime.    Marland Kitchen diltiazem (DILACOR XR) 240 MG 24 hr capsule Take 240 mg by mouth at bedtime.    . fish oil-omega-3 fatty acids 1000 MG capsule Take 1 g by mouth 2 (two) times daily.     . furosemide (LASIX) 40 MG tablet Take 40 mg by mouth.    Marland Kitchen guaiFENesin (MUCINEX) 600 MG 12 hr tablet Take by mouth 2 (two) times daily.    Marland Kitchen levalbuterol (XOPENEX HFA) 45 MCG/ACT inhaler INHALE 2 PUFFS BY MOUTH EVERY 4 HOURS AS NEEDED 15 g 1  . levocetirizine (XYZAL) 5 MG tablet Take 5 mg by mouth every evening.    . magnesium chloride (SLOW-MAG) 64 MG TBEC Take 1-2 tablets by mouth 2 (two) times daily. Patient takes 2 tablet every morning and 1 tablet with dinner    . mometasone (NASONEX) 50 MCG/ACT nasal spray SHAKE LIQUID AND USE 2 SPRAYS IN EACH NOSTRIL EVERY DAY 17 g 5  . montelukast (SINGULAIR) 10 MG tablet Take 10 mg  by mouth at bedtime.    . Multiple Vitamin (MULTIVITAMIN) tablet Take 1 tablet by mouth 2 (two) times daily.     . Olopatadine HCl 0.2 % SOLN Apply 1 drop to eye 2 (two) times daily as needed.    Marland Kitchen omeprazole (PRILOSEC) 20 MG capsule Take 1 capsule (20 mg total) by mouth daily. 30 capsule 5  . potassium chloride SA (K-DUR,KLOR-CON) 20 MEQ tablet Take 20-40 mEq by mouth 2 (two) times daily. 40 meq with breakfast and 20 meq with dinner    . Propylene Glycol 0.6 % SOLN Apply to eye.    . quiNINE (QUALAQUIN) 324 MG capsule Take 324 mg by mouth at bedtime.     . RHOPRESSA 0.02 % SOLN Place 1 drop into the right eye in the morning.    Marland Kitchen  rivaroxaban (XARELTO) 20 MG TABS tablet Take 20 mg by mouth at bedtime.     . sodium chloride (OCEAN) 0.65 % SOLN nasal spray Place 2 sprays into both nostrils 2 (two) times daily.     . Sodium Chloride-Sodium Bicarb (NETI POT SINUS WASH NA) Place into the nose 2 (two) times daily as needed.    Viviana Simpler ELLIPTA 200-62.5-25 MCG/INH AEPB INHALE 1 PUFF INTO THE LUNGS DAILY 60 each 3  . valsartan-hydrochlorothiazide (DIOVAN-HCT) 80-12.5 MG per tablet Take 1 tablet by mouth every morning.     . Vitamin D, Ergocalciferol, (DRISDOL) 50000 UNITS CAPS capsule Take 50,000 Units by mouth every 7 (seven) days. Patient takes on Saturday    . fexofenadine (ALLEGRA) 180 MG tablet Take 1 tablet by mouth daily. (Patient not taking: Reported on 08/30/2020)    . levalbuterol (XOPENEX) 1.25 MG/3ML nebulizer solution Take 1.25 mg by nebulization every 6 (six) hours as needed for wheezing or shortness of breath.  (Patient not taking: Reported on 08/30/2020)     No facility-administered medications prior to visit.      Review of Systems  Review of Systems  Constitutional: Negative.   HENT: Negative.   Respiratory: Positive for wheezing.   Cardiovascular: Negative.      Physical Exam  BP 122/70 (BP Location: Right Arm, Cuff Size: Normal)   Pulse 78   Temp (!) 97.2 F (36.2 C)  (Temporal)   Ht 5' 4.75" (1.645 m)   Wt 293 lb 3.2 oz (133 kg)   LMP 07/26/2011   SpO2 100% Comment: RA  BMI 49.17 kg/m  Physical Exam Constitutional:      Appearance: Normal appearance. She is obese.  HENT:     Mouth/Throat:     Mouth: Mucous membranes are moist.     Pharynx: Oropharynx is clear.  Pulmonary:     Breath sounds: Wheezing present.  Musculoskeletal:        General: Normal range of motion.  Skin:    General: Skin is warm and dry.  Neurological:     General: No focal deficit present.     Mental Status: She is alert and oriented to person, place, and time. Mental status is at baseline.  Psychiatric:        Mood and Affect: Mood normal.        Behavior: Behavior normal.        Thought Content: Thought content normal.        Judgment: Judgment normal.      Lab Results:  CBC    Component Value Date/Time   WBC 5.1 01/03/2020 1021   RBC 4.63 01/03/2020 1021   HGB 12.3 01/03/2020 1021   HGB 12.3 11/04/2017 0943   HGB 13.1 12/20/2007 1044   HCT 37.6 01/03/2020 1021   HCT 36.5 11/04/2017 0943   HCT 39.1 12/20/2007 1044   PLT 148.0 (L) 01/03/2020 1021   PLT 154 12/20/2007 1044   MCV 81.3 01/03/2020 1021   MCV 80 11/04/2017 0943   MCV 84.3 12/20/2007 1044   MCH 26.9 11/04/2017 0943   MCH 26.8 03/25/2014 0446   MCHC 32.7 01/03/2020 1021   RDW 15.7 (H) 01/03/2020 1021   RDW 15.3 11/04/2017 0943   RDW 15.6 (H) 12/20/2007 1044   LYMPHSABS 2.1 01/03/2020 1021   LYMPHSABS 2.1 11/04/2017 0943   LYMPHSABS 1.9 12/20/2007 1044   MONOABS 0.4 01/03/2020 1021   MONOABS 0.5 12/20/2007 1044   EOSABS 0.7 01/03/2020 1021   EOSABS 0.4 11/04/2017 0943  BASOSABS 0.0 01/03/2020 1021   BASOSABS 0.0 11/04/2017 0943   BASOSABS 0.1 12/20/2007 1044    BMET    Component Value Date/Time   NA 143 11/04/2017 0943   K 3.9 11/04/2017 0943   CL 107 (H) 11/04/2017 0943   CO2 22 11/04/2017 0943   GLUCOSE 81 11/04/2017 0943   GLUCOSE 159 (H) 03/23/2014 0435   BUN 10  11/04/2017 0943   CREATININE 1.13 (H) 11/04/2017 0943   CALCIUM 9.0 11/04/2017 0943   GFRNONAA 54 (L) 11/04/2017 0943   GFRAA 62 11/04/2017 0943    BNP No results found for: BNP  ProBNP    Component Value Date/Time   PROBNP <30.0 08/09/2007 0040    Imaging: No results found.   Assessment & Plan:   Moderate persistent asthma - Reports ongoing wheezing, worse off PPI. She is complaint with Trelegy 200. She had wheezing throughout lung fields on exam. She received Depomederol 80mg  IM injection in office today and sending in Rx for prednisone taper for acute exacerbation. She is following with [facility] [place]. If requiring oral steriods more than twice a year may need to look into biologics, IgE in September 2021 was 482, Eos absolute 400.   Recommendations: - Continue Trelegy 200 one puff daily in morning (rinse mouth after use) - Continue Singulair 10mg  at bedtime  - Continue Omeprazole 40mg  daily before breakfast    Sleep apnea, obstructive - Not consistently wearing CPAP, we are unable to access download today. She will need to bring card into [facility] for them to pull download from her machine.  - Needs to Establish with [facility]; please service current machine and provide new supplies. CPAP pressure 5-15cm h20 - FU in 4 months     Martyn Ehrich, NP 09/02/2020

## 2020-09-02 ENCOUNTER — Telehealth: Payer: Self-pay | Admitting: Primary Care

## 2020-09-02 ENCOUNTER — Encounter: Payer: Self-pay | Admitting: Primary Care

## 2020-09-02 DIAGNOSIS — G4733 Obstructive sleep apnea (adult) (pediatric): Secondary | ICD-10-CM

## 2020-09-02 NOTE — Telephone Encounter (Signed)
Order has been placed for the pt to have a DL received from [facility] and an order for her to get a new cpap machine.

## 2020-09-02 NOTE — Telephone Encounter (Signed)
Called and spoke with patient. She stated that [facility] had contacted her today to let her know they had received an order for supplies. She was confused she was under the impression that Beth had placed an order for [facility] to get a download from her current machine and for her to get a new machine. I did advise her that 2 orders were placed for [facility] to service her current machine and for supplies.   She is now requesting to have an order sent for [facility] to get a download from her machine as well as getting a new machine. Beth, please advise if you are ok with these orders. Thanks.

## 2020-09-02 NOTE — Telephone Encounter (Signed)
Yes to both.

## 2020-09-02 NOTE — Assessment & Plan Note (Addendum)
-   Not consistently wearing CPAP, we are unable to access download today. She will need to bring card into [facility] for them to pull download from her machine.  - Needs to Establish with [facility]; please service current machine and provide new supplies. CPAP pressure 5-15cm h20 - FU in 4 months

## 2020-09-02 NOTE — Assessment & Plan Note (Addendum)
-   Reports ongoing wheezing, worse off PPI. She is complaint with Trelegy 200. She had wheezing throughout lung fields on exam. She received Depomederol 80mg  IM injection in office today and sending in Rx for prednisone taper for acute exacerbation. She is following with [facility] [place]. If requiring oral steriods more than twice a year may need to look into biologics, IgE in September 2021 was 482, Eos absolute 400.   Recommendations: - Continue Trelegy 200 one puff daily in morning (rinse mouth after use) - Continue Singulair 10mg  at bedtime  - Continue Omeprazole 40mg  daily before breakfast

## 2020-09-03 ENCOUNTER — Telehealth: Payer: Self-pay | Admitting: Primary Care

## 2020-09-03 MED ORDER — OMEPRAZOLE 20 MG PO CPDR
20.0000 mg | DELAYED_RELEASE_CAPSULE | Freq: Every day | ORAL | 5 refills | Status: DC
Start: 2020-09-03 — End: 2020-10-03

## 2020-09-03 NOTE — Telephone Encounter (Signed)
Called and spoke with pt and she is aware of rx that has been sent to the pharmacy.  Nothing further is needed.  

## 2020-09-05 ENCOUNTER — Telehealth: Payer: Self-pay | Admitting: Primary Care

## 2020-09-05 DIAGNOSIS — G4733 Obstructive sleep apnea (adult) (pediatric): Secondary | ICD-10-CM

## 2020-09-05 NOTE — Telephone Encounter (Signed)
New, Tobey Bride, Allyne Gee, [place]; [employer] [place], [place] Order received, However to process the order for new cpap I will need an updated order with settings, Dx and type of cpap unit. Also, her most recent notes do not show compliance and benefits. I will need notes from MD showing compliance and benefits to bill this through insurance.   Patient will need to bring in cpap unit for download, I will alert our RT staff to review for download while doing her machine troubleshoot.   Thank you,   Demetrius Charity

## 2020-09-05 NOTE — Telephone Encounter (Signed)
See message below from [facility] with [facility]. Beth, please advise if you need pt's DL before amending pt's OV note stating she is compliant and benefiting from her CPAP? Thanks  New order placed for a new CPAP per [employer].

## 2020-09-06 NOTE — Telephone Encounter (Signed)
Pt is not in [place]. I have sent a community message to [place] asking pt to be uploaded in [place], so a download can be obtained.  Will keep message to follow up on.

## 2020-09-06 NOTE — Telephone Encounter (Signed)
Yes I need download and then can addend note

## 2020-09-06 NOTE — Telephone Encounter (Signed)
Brad messaged back, states that patient's machine is not compatible with airview anymore, and they are waiting on the patient to bring her machine in to [facility] to obtain a download. They will fax this download once they receive it.   Will keep encounter open to follow up on.

## 2020-09-10 NOTE — Telephone Encounter (Signed)
[name], please advise if the DL has been received yet. Thanks.

## 2020-09-10 NOTE — Telephone Encounter (Signed)
I have not seen a download on pt. Checked with Beth to see if she had a download in her review folder on pt and she said that there was no download in there.  Called and spoke with pt to see if she had been able to go to [facility] to take her machine so they could be able to get a download off of it and pt said that she hadn't been able to go yet. Pt had an appt today 5/24 with [facility] but had to cancel it due to a conflict come up and had to cancel her appt.  Pt said she is going to be taking machine to [facility] next week so they can get a download off of it.  Will keep encounter open and await fax from [facility] once pt is able to take CPAP machine there.

## 2020-09-19 ENCOUNTER — Other Ambulatory Visit: Payer: Self-pay | Admitting: Allergy

## 2020-09-25 NOTE — Telephone Encounter (Signed)
[name], please advise if a DL was received. Thanks.

## 2020-09-26 NOTE — Telephone Encounter (Signed)
Checked with Beth to see if she had received a download yet on pt and Beth stated that she did not have one.  Called and spoke with pt to see if she had been able to get out to [facility] so they could get a download off of her CPAP machine and she stated that she did go out to [facility] today 6/9.  Will keep encounter open to await report to be sent.

## 2020-10-02 NOTE — Telephone Encounter (Signed)
I still have not seen a download come on pt. I have sent community message to [facility] team about this. Will await a response.

## 2020-10-03 ENCOUNTER — Ambulatory Visit: Payer: BC Managed Care – PPO | Admitting: Allergy

## 2020-10-03 ENCOUNTER — Encounter: Payer: Self-pay | Admitting: Allergy

## 2020-10-03 ENCOUNTER — Other Ambulatory Visit: Payer: Self-pay

## 2020-10-03 VITALS — BP 128/74 | HR 80 | Temp 96.5°F | Resp 16 | Ht 64.0 in | Wt 300.0 lb

## 2020-10-03 DIAGNOSIS — H1013 Acute atopic conjunctivitis, bilateral: Secondary | ICD-10-CM

## 2020-10-03 DIAGNOSIS — K219 Gastro-esophageal reflux disease without esophagitis: Secondary | ICD-10-CM

## 2020-10-03 DIAGNOSIS — J4540 Moderate persistent asthma, uncomplicated: Secondary | ICD-10-CM

## 2020-10-03 DIAGNOSIS — J3089 Other allergic rhinitis: Secondary | ICD-10-CM

## 2020-10-03 MED ORDER — TRELEGY ELLIPTA 200-62.5-25 MCG/INH IN AEPB
1.0000 | INHALATION_SPRAY | Freq: Every day | RESPIRATORY_TRACT | 1 refills | Status: DC
Start: 2020-10-03 — End: 2020-12-02

## 2020-10-03 MED ORDER — OMEPRAZOLE 20 MG PO CPDR
20.0000 mg | DELAYED_RELEASE_CAPSULE | Freq: Every day | ORAL | 5 refills | Status: DC
Start: 2020-10-03 — End: 2022-01-01

## 2020-10-03 MED ORDER — MOMETASONE FUROATE 50 MCG/ACT NA SUSP: NASAL | 3 refills | Status: DC

## 2020-10-03 MED ORDER — OLOPATADINE HCL 0.2 % OP SOLN: 1.0000 [drp] | Freq: Every day | OPHTHALMIC | 3 refills | Status: DC | PRN

## 2020-10-03 MED ORDER — MONTELUKAST SODIUM 10 MG PO TABS
10.0000 mg | ORAL_TABLET | Freq: Every day | ORAL | 5 refills | Status: DC
Start: 2020-10-03 — End: 2022-01-01

## 2020-10-03 MED ORDER — LEVOCETIRIZINE DIHYDROCHLORIDE 5 MG PO TABS
5.0000 mg | ORAL_TABLET | Freq: Every evening | ORAL | 5 refills | Status: DC
Start: 2020-10-03 — End: 2021-04-21

## 2020-10-03 MED ORDER — LEVALBUTEROL TARTRATE 45 MCG/ACT IN AERO: 2.0000 | INHALATION_SPRAY | RESPIRATORY_TRACT | 1 refills | Status: DC | PRN

## 2020-10-03 MED ORDER — AZELASTINE HCL 0.1 % NA SOLN: 2.0000 | Freq: Two times a day (BID) | NASAL | 5 refills | Status: DC | PRN

## 2020-10-03 NOTE — Progress Notes (Signed)
Follow-up Note  RE: [name] MRN: [id number] DOB: [birth year] Date of Office Visit: 10/03/2020   History of present illness: [name] is a 60 y.o. female presenting today for follow-up of asthma, allergies and reflux.  She was last seen in the office on 06/06/2020 by myself.  After her last visit she states she became sick related to her allergies (worsening reflux feeling, shortness of breath, tightness) and states Dr. Elsworth Soho prescribed her 15 days of prednisone but states that really didn't help.  She then followed up with his NP Volanda Napoleon at [facility] pulmonology. She continues to use Trelegy 247mcg 1 puff daily and singulair daily as well as omeprazole.   At her last visit with NP Volanda Napoleon from 08/30/20 she was noted to have wheezing on exam and was treated with depomedrol 80mg  injection in office and a prescription for prednisone sent in.  She states this greatly helped her and she has not had any further asthma symptoms since.  She does state in regards to her asthma and allergy control that she has been doing a oregano oil 1 drop under her tongue and feels this has been beneficial.  She states she is having itchy ears. She is using Xyzal (levocetirizine) at this time.  She states the xyzal is working for her.  She was taking Allegra previously but this became less effective with time.  She also states using her nasal sprays appropriately has been very beneficial.  She uses her nasal steroid Nasonex for congestion and Astelin for drainage.  She also continues on Singulair daily.  She takes omeprazole daily for reflux control and this continues to be beneficial.  She states she should be receiving her new CPAP device sting.  Her previous one had a short and the coil was not recording her working properly.  She does report daytime somnolence.  Review of systems in the past 4 weeks: Review of Systems  Constitutional: Negative.   HENT: Negative.    Eyes: Negative.   Respiratory: Negative.     Cardiovascular: Negative.   Gastrointestinal: Negative.   Musculoskeletal: Negative.   Skin:  Positive for itching. Negative for rash.  Neurological: Negative.    All other systems negative unless noted above in HPI  Past medical/social/surgical/family history have been reviewed and are unchanged unless specifically indicated below.  No changes  Medication List: Current Outpatient Medications  Medication Sig Dispense Refill   acetaminophen (TYLENOL) 500 MG tablet Take 1,000 mg by mouth every 4 (four) hours as needed for moderate pain.     Ascorbic Acid (VITAMIN C) 1000 MG tablet Take 1,000 mg by mouth every morning.      azelastine (ASTELIN) 0.1 % nasal spray Place 2 sprays into both nostrils 2 (two) times daily as needed (nasal drainage). Use in each nostril as directed 30 mL 5   b complex vitamins tablet Take 1 tablet by mouth every morning.      bimatoprost (LUMIGAN) 0.01 % SOLN at bedtime.     brinzolamide (AZOPT) 1 % ophthalmic suspension Place 1 drop into both eyes 3 (three) times daily.     Calcium Carbonate-Vit D-Min (CALTRATE 600+D PLUS MINERALS PO) Take 1 tablet by mouth 3 (three) times daily.      COMBIGAN 0.2-0.5 % ophthalmic solution Apply 1 drop to eye 3 (three) times daily.     Cyanocobalamin (VITAMIN B-12) 2500 MCG SUBL Place 2 tablets under the tongue at bedtime.     diltiazem (DILACOR XR) 240 MG  24 hr capsule Take 240 mg by mouth at bedtime.     fish oil-omega-3 fatty acids 1000 MG capsule Take 1 g by mouth 2 (two) times daily.      furosemide (LASIX) 40 MG tablet Take 40 mg by mouth.     guaiFENesin (MUCINEX) 600 MG 12 hr tablet Take by mouth 2 (two) times daily.     levalbuterol (XOPENEX HFA) 45 MCG/ACT inhaler INHALE 2 PUFFS BY MOUTH EVERY 4 HOURS AS NEEDED 15 g 1   levalbuterol (XOPENEX) 1.25 MG/3ML nebulizer solution Take 1.25 mg by nebulization every 6 (six) hours as needed for wheezing or shortness of breath.     levocetirizine (XYZAL) 5 MG tablet Take 5 mg  by mouth every evening.     magnesium chloride (SLOW-MAG) 64 MG TBEC Take 1-2 tablets by mouth 2 (two) times daily. Patient takes 2 tablet every morning and 1 tablet with dinner     mometasone (NASONEX) 50 MCG/ACT nasal spray SHAKE LIQUID AND USE 2 SPRAYS IN EACH NOSTRIL EVERY DAY 17 g 5   montelukast (SINGULAIR) 10 MG tablet Take 10 mg by mouth at bedtime.     Multiple Vitamin (MULTIVITAMIN) tablet Take 1 tablet by mouth 2 (two) times daily.      Olopatadine HCl 0.2 % SOLN Apply 1 drop to eye 2 (two) times daily as needed.     omeprazole (PRILOSEC) 20 MG capsule Take 1 capsule (20 mg total) by mouth daily. 30 capsule 5   potassium chloride SA (K-DUR,KLOR-CON) 20 MEQ tablet Take 20-40 mEq by mouth 2 (two) times daily. 40 meq with breakfast and 20 meq with dinner     Propylene Glycol 0.6 % SOLN Apply to eye.     quiNINE (QUALAQUIN) 324 MG capsule Take 324 mg by mouth at bedtime.      RHOPRESSA 0.02 % SOLN Place 1 drop into the right eye in the morning.     rivaroxaban (XARELTO) 20 MG TABS tablet Take 20 mg by mouth at bedtime.      sodium chloride (OCEAN) 0.65 % SOLN nasal spray Place 2 sprays into both nostrils 2 (two) times daily.      Sodium Chloride-Sodium Bicarb (NETI POT SINUS [place] NA) Place into the nose 2 (two) times daily as needed.     TRELEGY ELLIPTA 200-62.5-25 MCG/INH AEPB INHALE 1 PUFF INTO THE LUNGS DAILY 60 each 3   valsartan-hydrochlorothiazide (DIOVAN-HCT) 80-12.5 MG per tablet Take 1 tablet by mouth every morning.      Vitamin D, Ergocalciferol, (DRISDOL) 50000 UNITS CAPS capsule Take 50,000 Units by mouth every 7 (seven) days. Patient takes on Saturday     No current facility-administered medications for this visit.     Known medication allergies: Allergies  Allergen Reactions   Hydrocodone-Acetaminophen Itching and Rash   Beta Adrenergic Blockers Other (See Comments)    RED EYES AND CONGESTION   Hydrocodone    Penicillins Hives, Itching and Swelling   Sulfa  Antibiotics Hives    RED EYES   Thimerosal Hives and Itching     Physical examination: Blood pressure 128/74, pulse 80, temperature (!) 96.5 F (35.8 C), temperature source Temporal, resp. rate 16, height 5\' 4"  (1.626 m), weight 300 lb (136.1 kg), last menstrual period 07/26/2011, SpO2 98 %.  General: Alert, interactive, in no acute distress. HEENT: PERRLA, TMs pearly gray, turbinates non-edematous without discharge, post-pharynx non erythematous. Neck: Supple without lymphadenopathy. Lungs: Clear to auscultation without wheezing, rhonchi or rales. {no increased work of  breathing. CV: Normal S1, S2 without murmurs. Abdomen: Nondistended, nontender. Skin: Warm and dry, without lesions or rashes. Extremities:  No clubbing, cyanosis or edema. Neuro:   Grossly intact.  Diagnositics/Labs:  Spirometry: FEV1: 1.69L 81%, FVC: 2.15L 81%, ratio consistent with nonobstructive pattern.   Assessment and plan:   Asthma, moderate persistent - continue Trelegy 260mcg 1 inhalation once a day.  Rinse mouth after use - continue Singulair 10mg  daily - have access to Xopenex inhaler 2 puffs every 4-6 hours as needed for cough/wheeze/shortness of breath/chest tightness.  May use 15-20 minutes prior to activity.   Monitor frequency of use.   - if you have more asthma flares then may need to consider step-up in therapy to an anti-eosinophilic asthma medication like Nucala, Fasenra or even Dupixent injectable therapies.    Asthma control goals:  Full participation in all desired activities (may need albuterol before activity) Albuterol use two time or less a week on average (not counting use with activity) Cough interfering with sleep two time or less a month Oral steroids no more than once a year No hospitalizations  Allergic rhinitis with conjunctivitis  - continue your allergen avoidance measures  - continue use of Xyzal 5mg  daily   - you may take additional antihistamine (Allegra) dose in a day  if needed for itch  - singulair as above helps also with allergy symptom control  - for itchy/watery eyes can continue use of as needed Pazeo or Pataday  - for nasal congestion/stuffiness use of Nasonex or Nasacort 1-2 sprays each nostril daily as needed  - for nasal drainage/post-nasal drip use Astelin 2 sprays each nostril 2 times a day for symptom control  Reflux  - reflux symptoms can be triggers of asthma symptoms  - you have had adverse symptoms with Famotidine thus will not recommend use  - continue use of Omeprazole as directed for reflux control  Follow-up 4 months or sooner if needed  I appreciate the opportunity to take part in Chrisette's care. Please do not hesitate to contact me with questions.  Sincerely,   Prudy Feeler, MD Allergy/Immunology [facility]

## 2020-10-03 NOTE — Patient Instructions (Addendum)
Asthma - continue Trelegy 273mcg 1 inhalation once a day.  Rinse mouth after use - continue Singulair 10mg  daily - have access to Xopenex inhaler 2 puffs every 4-6 hours as needed for cough/wheeze/shortness of breath/chest tightness.  May use 15-20 minutes prior to activity.   Monitor frequency of use.   - if you have more asthma flares then may need to consider step-up in therapy to an anti-eosinophilic asthma medication like Nucala, Fasenra or even Dupixent injectable therapies.    Asthma control goals:  Full participation in all desired activities (may need albuterol before activity) Albuterol use two time or less a week on average (not counting use with activity) Cough interfering with sleep two time or less a month Oral steroids no more than once a year No hospitalizations  Allergies  - continue your allergen avoidance measures  - continue use of Xyzal 5mg  daily   - you may take additional antihistamine (Allegra) dose in a day if needed for itch  - singulair as above helps also with allergy symptom control  - for itchy/watery eyes can continue use of as needed Pazeo or Pataday  - for nasal congestion/stuffiness use of Nasonex or Nasacort 1-2 sprays each nostril daily as needed  - for nasal drainage/post-nasal drip use Astelin 2 sprays each nostril 2 times a day for symptom control  Reflux  - reflux symptoms can be triggers of asthma symptoms  - you have had adverse symptoms with Famotidine thus will not recommend use  - continue use of Omeprazole as directed for reflux control  Follow-up 4 months or sooner if needed

## 2020-10-04 NOTE — Telephone Encounter (Signed)
Reviewed download April,May 2022, compliance is poor, several missed nights. CPAP 13 cm is effective Please let her know that she needs to use the CPAP every night for at least 4 hours

## 2020-10-04 NOTE — Telephone Encounter (Signed)
I called the pt and notified of results/recs per Dr Elsworth Soho  She states that it appears she is not using the machine sometimes bc the machine is not working well and card does not work  I checked referral and looks like we just order a new machine for her in May and [facility] received this  Will forward to [facility] to see if they can check on status of her order  She states never has any luck reaching anyone when she calls

## 2020-10-07 NOTE — Telephone Encounter (Signed)
Called [facility] & spoke to [place].  They do have download now & he is sending message to working/replacement team to let them know info has been received and will let them know machine does not work & will get it in process.  Nothing further needed.

## 2020-10-07 NOTE — Telephone Encounter (Signed)
Download has been received and has been placed in Beth's review folder. 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

## 2020-10-09 ENCOUNTER — Telehealth: Payer: Self-pay | Admitting: Primary Care

## 2020-10-09 DIAGNOSIS — G4733 Obstructive sleep apnea (adult) (pediatric): Secondary | ICD-10-CM

## 2020-10-09 NOTE — Telephone Encounter (Addendum)
Download shows 33.3% compliance from 08/27/20-09/25/20. Average pressure 13cm h20, AHI 2.8. No changes.   Needs to Establish with [facility]; please service current machine and provide new supplies. CPAP pressure 5-15cm h20

## 2020-10-09 NOTE — Telephone Encounter (Signed)
Called and spoke with pt letting her know the info stated by [facility] about compliance report. Stated to pt that we were going to send order to [facility] to have her machine serviced and when stated that to pt, she said that [facility] cannot service her machine.  Pt said her cpap cord is not working properly as there is a short in the cord. [facility] said that they cannot order her a new cord for her machine due to the machine being outdated as she has had it since 2014.  Pt states that she uses her machine religiously even when she takes a nap.  Beth, please advise on this.

## 2020-10-09 NOTE — Telephone Encounter (Signed)
Can we place an order for new machine, offer luna machine  This is from EW from the previous phone note---  I have called and spoke with pt and she is aware of the order that has been placed for her to receive the LUNA machine.  She stated that she looked up online and it stated that [employer] was out of the LUNA and the LUNA 2 machines.  She is worried that she will get a machine that is out of circulation and she will be having the same issues again.

## 2020-10-09 NOTE — Telephone Encounter (Signed)
Pt is returning phone call. Pls regard; [phone]

## 2020-10-09 NOTE — Telephone Encounter (Signed)
Can we place an order for new machine, offer luna machine

## 2020-10-09 NOTE — Telephone Encounter (Signed)
Attempted to call pt but unable to reach. Left message for her to return call. 

## 2020-10-15 ENCOUNTER — Telehealth: Payer: Self-pay | Admitting: Primary Care

## 2020-10-15 NOTE — Telephone Encounter (Signed)
Please see below from [employer] and advise.  New, Remus Blake, Willodean Rosenthal; [place] Yes, we can use the old order and verbal to process, but this was put in as a new order so I was sending back to make sure there are no setting changes or any other changes needed. If this is the same as old order I will email the team to move forward with processing.  However, If a change is needed please update the order and re-send to me so I get the proper settings and changes.   Thanks,   Leroy [place]         Previous Messages    ----- Message -----  From: Darlina Guys  Sent: 10/14/2020   7:06 AM EDT  To: Darlina Guys, Rachel Moulds, *  Subject: RE: CPAP                                       Michel Santee,  Can you use the referral dated 5/19?  That one has the settings but does not specify Luna.  Can we use a verbal order for [facility] approval?     If not, Tasia, we'll need an new order on the CPAP template with settings that specifies permission for [facility].   Thanks everyone!  Melissa   ----- Message -----  From: Miquel Dunn  Sent: 10/11/2020  11:46 AM EDT  To: Darlina Guys, Rachel Moulds, *  Subject: RE: CPAP                                       Order received, However to process this order I will need to have the order updated to show the settings for insurance to review for processing.   Once this updated info is received we will continue to process this order.   Thank you,   Demetrius Charity

## 2020-10-15 NOTE — Telephone Encounter (Signed)
CM sent to [employer].   

## 2020-10-15 NOTE — Telephone Encounter (Signed)
I reviewed the order. Nothing has changed since the order was placed back on 05/19.

## 2020-10-16 ENCOUNTER — Encounter: Payer: Self-pay | Admitting: Family Medicine

## 2020-10-16 ENCOUNTER — Emergency Department (INDEPENDENT_AMBULATORY_CARE_PROVIDER_SITE_OTHER): Payer: BC Managed Care – PPO

## 2020-10-16 ENCOUNTER — Emergency Department
Admission: EM | Admit: 2020-10-16 | Discharge: 2020-10-16 | Disposition: A | Discharge status: Home / Self Care | Payer: BC Managed Care – PPO | Source: Home / Self Care

## 2020-10-16 ENCOUNTER — Other Ambulatory Visit: Payer: Self-pay

## 2020-10-16 DIAGNOSIS — Z23 Encounter for immunization: Secondary | ICD-10-CM | POA: Diagnosis not present

## 2020-10-16 DIAGNOSIS — M79671 Pain in right foot: Secondary | ICD-10-CM

## 2020-10-16 DIAGNOSIS — Z299 Encounter for prophylactic measures, unspecified: Secondary | ICD-10-CM

## 2020-10-16 MED ORDER — TETANUS-DIPHTH-ACELL PERTUSSIS 5-2.5-18.5 LF-MCG/0.5 IM SUSY
0.5000 mL | PREFILLED_SYRINGE | Freq: Once | INTRAMUSCULAR | Status: AC
  Administered 2020-10-16: 20:00:00 0.5 mL via INTRAMUSCULAR

## 2020-10-16 NOTE — ED Triage Notes (Signed)
RT foot injury 2 weeks ago, stepped on an earring, puncture wound.

## 2020-10-16 NOTE — ED Provider Notes (Signed)
[facility]    CSN: [id number] Arrival date & time: 10/16/20  1912      History   Chief Complaint Chief Complaint  Patient presents with   Foot Injury    HPI [name] is a 60 y.o. female.   HPI 60 year old female presents with right foot pain for 2 weeks.  Reports stepping on an earring and having puncture wound.  Past Medical History:  Diagnosis Date   Anemia    Anxiety    Arthritis    knee    Asthma    Back pain    Depression    Diabetes (HCC)    DVT (deep venous thrombosis) (HCC)    Dysfunctional uterine bleeding    Dyspnea    GERD (gastroesophageal reflux disease)    Glaucoma    Headache(784.0)    MIGRAINES IN PAST   Hypertension    Hypothyroidism    Joint pain    Lactose intolerance    Leg edema    Morbid obesity ([place])    Multiple food allergies    Optic neuropathy    Osteoarthritis    PE (pulmonary embolism)    Sleep apnea, obstructive    USES C-PAP    Patient Active Problem List   Diagnosis Date Noted   Vitreous hemorrhage of left eye ([place]) 11/13/2019   GERD (gastroesophageal reflux disease) 10/03/2019   Central retinal vein occlusion with neovascularization of left eye 09/04/2019   Primary open angle glaucoma of both eyes, severe stage 09/04/2019   Nondiabetic proliferative retinopathy, left 09/04/2019   Shortness of breath on exertion 11/04/2017   Type 2 diabetes mellitus without complication, without long-term current use of insulin ([place]) 11/04/2017   Essential hypertension 11/04/2017   Other headache syndrome 08/30/2014   Speech disturbance 08/30/2014   Vertigo 08/30/2014   Right knee DJD 03/22/2014   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 01/30/2014   Anemia 09/10/2011   ARF (acute renal failure) ([place]) 09/10/2011   Paresthesias 09/10/2011   Total knee replacement status, left 09/10/2011   Cervical spondylosis with myelopathy 06/30/2011   Osteoarthritis of both knees 06/30/2011   Diabetes mellitus     Arthritis    Sleep apnea, obstructive    Morbid obesity ([place])    Dysfunctional uterine bleeding    HEPATITIS C 01/25/2008   HYPERTENSION 01/25/2008   C V A / STROKE 01/25/2008   Moderate persistent asthma 01/25/2008   Asthma, persistent not controlled 01/25/2008    Past Surgical History:  Procedure Laterality Date   CAPSULOTOMY  05/03/2012   Procedure: MINOR CAPSULOTOMY;  Surgeon: Myrtha Mantis., MD;  Location[facility];  Service: Ophthalmology;  Laterality: Right;   CAPSULOTOMY  05/05/2012   Procedure: MINOR CAPSULOTOMY;  Surgeon: Myrtha Mantis., MD;  Location: [place];  Service: Ophthalmology;  Laterality: Right;   CATARACT SURG     DILATION AND CURETTAGE OF UTERUS     ENDOMETRIAL ABLATION     failure    EYE SURGERY  11/16/2009   FOR GLAUCOMA AND LASER SURGERY  X2   GASTRIC BYPASS  [id number]   GLAUCOMA SURGERY  10/31/2009   HERNIA REPAIR     HIATAL HERNIA REPAIR  01/30/2014   Procedure: HERNIA REPAIR HIATAL;  Surgeon: Pedro Earls, MD;  Location[facility];  Service: General;;   HYSTEROSCOPY     IVC FILTER     2010 - THEN REMOVED THEN PLACED AGAIN 2012   JOINT REPLACEMENT  KNEE ARTHROSCOPY  12/2007   LAPAROSCOPIC GASTRIC BANDING N/A 01/30/2014   Procedure: LAPAROSCOPIC GASTRIC BANDING over bypass ;  Surgeon: Pedro Earls, MD;  Location: [facility];  Service: General;  Laterality: N/A;   POSTERIOR LAMINECTOMY / DECOMPRESSION CERVICAL SPINE  01/26/2011   TOTAL KNEE ARTHROPLASTY  09/09/2011   Procedure: TOTAL KNEE ARTHROPLASTY;  Surgeon: Johnn Hai, MD;  Location: [facility];  Service: Orthopedics;  Laterality: Left;   TOTAL KNEE ARTHROPLASTY Right 03/22/2014   Procedure: RIGHT TOTAL KNEE ARTHROPLASTY;  Surgeon: Johnn Hai, MD;  Location: [facility];  Service: Orthopedics;  Laterality: Right;   UPPER GI ENDOSCOPY N/A 01/30/2014   Procedure: UPPER GI ENDOSCOPY;  Surgeon: Pedro Earls, MD;  Location: [facility];  Service: General;  Laterality: N/A;   YAG LASER  APPLICATION  07/27/8117   Procedure: YAG LASER APPLICATION;  Surgeon: Myrtha Mantis., MD;  Location: [place];  Service: Ophthalmology;  Laterality: Right;   YAG LASER APPLICATION  1/47/8295   Procedure: YAG LASER APPLICATION;  Surgeon: Myrtha Mantis., MD;  Location: [place];  Service: Ophthalmology;  Laterality: Right;    OB History     Gravida  0   Para  0   Term  0   Preterm  0   AB  0   Living  0      SAB  0   IAB  0   Ectopic  0   Multiple  0   Live Births  0            Home Medications    Prior to Admission medications   Medication Sig Start Date End Date Taking? Authorizing Provider  acetaminophen (TYLENOL) 500 MG tablet Take 1,000 mg by mouth every 4 (four) hours as needed for moderate pain.    [provider]  Ascorbic Acid (VITAMIN C) 1000 MG tablet Take 1,000 mg by mouth every morning.     [provider]  azelastine (ASTELIN) 0.1 % nasal spray Place 2 sprays into both nostrils 2 (two) times daily as needed (nasal drainage). Use in each nostril as directed 10/03/20   Kennith Gain, MD  b complex vitamins tablet Take 1 tablet by mouth every morning.     [provider]  bimatoprost (LUMIGAN) 0.01 % SOLN at bedtime. 10/24/12   [provider]  brinzolamide (AZOPT) 1 % ophthalmic suspension Place 1 drop into both eyes 3 (three) times daily.    Lucianne Lei, MD  Calcium Carbonate-Vit D-Min (CALTRATE 600+D PLUS MINERALS PO) Take 1 tablet by mouth 3 (three) times daily.     [provider]  COMBIGAN 0.2-0.5 % ophthalmic solution Apply 1 drop to eye 3 (three) times daily. 08/24/19   [provider]  Cyanocobalamin (VITAMIN B-12) 2500 MCG SUBL Place 2 tablets under the tongue at bedtime.    [provider]  diltiazem (DILACOR XR) 240 MG 24 hr capsule Take 240 mg by mouth at bedtime.    [provider]  fish oil-omega-3 fatty acids 1000 MG capsule Take 1 g by mouth 2 (two)  times daily.     [provider]  Fluticasone-Umeclidin-Vilant (TRELEGY ELLIPTA) 200-62.5-25 MCG/INH AEPB Inhale 1 puff into the lungs daily. 10/03/20   Kennith Gain, MD  furosemide (LASIX) 40 MG tablet Take 40 mg by mouth.    [provider]  levalbuterol Penne Lash HFA) 45 MCG/ACT inhaler Inhale 2 puffs into the lungs every 4 (four) hours as  needed. 10/03/20   Kennith Gain, MD  levalbuterol Penne Lash) 1.25 MG/3ML nebulizer solution Take 1.25 mg by nebulization every 6 (six) hours as needed for wheezing or shortness of breath.    [provider]  levocetirizine (XYZAL) 5 MG tablet Take 1 tablet (5 mg total) by mouth every evening. 10/03/20   Kennith Gain, MD  magnesium chloride (SLOW-MAG) 64 MG TBEC Take 1-2 tablets by mouth 2 (two) times daily. Patient takes 2 tablet every morning and 1 tablet with dinner    [provider]  mometasone (NASONEX) 50 MCG/ACT nasal spray SHAKE LIQUID AND USE 2 SPRAYS IN EACH NOSTRIL EVERY DAY 10/03/20   Kennith Gain, MD  montelukast (SINGULAIR) 10 MG tablet Take 1 tablet (10 mg total) by mouth at bedtime. 10/03/20   Kennith Gain, MD  Multiple Vitamin (MULTIVITAMIN) tablet Take 1 tablet by mouth 2 (two) times daily.     [provider]  Olopatadine HCl 0.2 % SOLN Apply 1 drop to eye daily as needed (Itchy, watery eyes). 10/03/20   Kennith Gain, MD  omeprazole (PRILOSEC) 20 MG capsule Take 1 capsule (20 mg total) by mouth daily. 10/03/20   Kennith Gain, MD  potassium chloride SA (K-DUR,KLOR-CON) 20 MEQ tablet Take 20-40 mEq by mouth 2 (two) times daily. 40 meq with breakfast and 20 meq with dinner    [provider]  Propylene Glycol 0.6 % SOLN Apply to eye.    [provider]  quiNINE (QUALAQUIN) 324 MG capsule Take 324 mg by mouth at bedtime.     Lucianne Lei, MD  RHOPRESSA 0.02 % SOLN Place 1 drop into the right eye in the  morning. 08/24/19   [provider]  rivaroxaban (XARELTO) 20 MG TABS tablet Take 20 mg by mouth at bedtime.     [provider]  sodium chloride (OCEAN) 0.65 % SOLN nasal spray Place 2 sprays into both nostrils 2 (two) times daily.     [provider]  Sodium Chloride-Sodium Bicarb (NETI POT SINUS [place] NA) Place into the nose 2 (two) times daily as needed.    [provider]  valsartan-hydrochlorothiazide (DIOVAN-HCT) 80-12.5 MG per tablet Take 1 tablet by mouth every morning.     [provider]  Vitamin D, Ergocalciferol, (DRISDOL) 50000 UNITS CAPS capsule Take 50,000 Units by mouth every 7 (seven) days. Patient takes on Saturday 12/06/12   [provider]    Family History Family History  Problem Relation Age of Onset   Arthritis Mother    Hypertension Mother    Diabetes Mother    Stroke Mother    Obesity Mother    Arthritis Father    Diabetes Father    Alcohol abuse Father    Hypertension Father    Sudden death Father    Glaucoma Other    Cerebral aneurysm Other    Stroke Other     Social History Social History   Tobacco Use   Smoking status: Never   Smokeless tobacco: Never  Vaping Use   Vaping Use: Never used  Substance Use Topics   Alcohol use: No   Drug use: No     Allergies   Hydrocodone-acetaminophen, Beta adrenergic blockers, Hydrocodone, Penicillins, Sulfa antibiotics, and Thimerosal   Review of Systems Review of Systems  Musculoskeletal:        Right foot pain x2 weeks  Skin:  Positive for wound.       Puncture wound of right foot  central aspect of sole    Physical Exam Triage Vital Signs ED Triage Vitals  Enc Vitals Group     BP 10/16/20 1925 (!) 169/100     Pulse Rate 10/16/20 1925 98     Resp 10/16/20 1925 20     Temp 10/16/20 1925 98.8 F (37.1 C)     Temp Source 10/16/20 1925 Oral     SpO2 10/16/20 1925 94 %     Weight 10/16/20 1926 299 lb (135.6 kg)     Height 10/16/20 1926 5\' 4"   (1.626 m)     Head Circumference --      Peak Flow --      Pain Score 10/16/20 1926 8     Pain Loc --      Pain Edu? --      Excl. in [place]? --    No data found.  Updated Vital Signs BP (!) 151/90 (BP Location: Right Arm)   Pulse 98   Temp 98.8 F (37.1 C) (Oral)   Resp 20   Ht 5\' 4"  (1.626 m)   Wt 299 lb (135.6 kg)   LMP 07/26/2011   SpO2 94%   BMI 51.32 kg/m      Physical Exam Vitals and nursing note reviewed.  Constitutional:      General: She is not in acute distress.    Appearance: Normal appearance. She is obese. She is not ill-appearing.  HENT:     Head: Normocephalic and atraumatic.     Mouth/Throat:     Mouth: Mucous membranes are moist.     Pharynx: Oropharynx is clear.  Eyes:     Extraocular Movements: Extraocular movements intact.     Conjunctiva/sclera: Conjunctivae normal.     Pupils: Pupils are equal, round, and reactive to light.  Cardiovascular:     Rate and Rhythm: Normal rate and regular rhythm.     Pulses: Normal pulses.     Heart sounds: Normal heart sounds.  Pulmonary:     Effort: Pulmonary effort is normal. No respiratory distress.     Breath sounds: Normal breath sounds. No wheezing, rhonchi or rales.  Musculoskeletal:     Comments: Right foot pain (central aspect of sole/adjacent to medial arch): TTP over previous puncture wound area, no foreign body noted, nonerythematous, nonindurated, nonfluctuant, non-TTP over posterior superior calcaneus.  Please note that I brought patient into provider office to show her images on computer screen.  Patient was more concerned with a well healed puncture area, no foreign body noted on x-ray or commented on by radiology.  Skin:    General: Skin is warm and dry.  Neurological:     General: No focal deficit present.     Mental Status: She is alert and oriented to person, place, and time.  Psychiatric:     Comments: Angry/agitated mood, argumentative with myself and RN staff on duty     UC Treatments /  Results  Labs (all labs ordered are listed, but only abnormal results are displayed) Labs Reviewed - No data to display  EKG   Radiology DG Foot Complete Right  Result Date: 10/16/2020 CLINICAL DATA:  Right foot pain EXAM: RIGHT FOOT COMPLETE - 3+ VIEW COMPARISON:  None. FINDINGS: Diffuse soft tissue swelling along the dorsum of the foot. Large corticated bone fragment noted superior to the posterior calcaneus which may be related to old Achilles injury or avulsion. No acute fracture, subluxation or dislocation. IMPRESSION: No acute bony abnormality. Electronically Signed   By:  Rolm Baptise M.D.   On: 10/16/2020 19:58    Procedures Procedures (including critical care time)  Medications Ordered in UC Medications  Tdap (BOOSTRIX) injection 0.5 mL (0.5 mLs Intramuscular Given 10/16/20 2015)    Initial Impression / Assessment and Plan / UC Course  I have reviewed the triage vital signs and the nursing notes.  Pertinent labs & imaging results that were available during my care of the patient were reviewed by me and considered in my medical decision making (see chart for details).     MDM: 1.  Right foot pain-patient advised to follow-up with orthopedic tomorrow due to large corticated bone fragment noted over superior posterior calcaneus and may be related to old Achilles injury/avulsion.  Orthopedic providers contact information placed in AVS this evening. Final Clinical Impressions(s) / UC Diagnoses   Final diagnoses:  Right foot pain  Preventive measure     Discharge Instructions      Advised patient to follow-up with orthopedic provider tomorrow, 10/17/2020 for further evaluation (large cortical bone fragment noted over superior posterior calcaneus).  She has been provided Tdap this evening per her request.     ED Prescriptions   None    PDMP not reviewed this encounter.   Eliezer Lofts, [place] 10/16/20 2058

## 2020-10-16 NOTE — Discharge Instructions (Addendum)
Advised patient to follow-up with orthopedic provider tomorrow, 10/17/2020 for further evaluation (large cortical bone fragment noted over superior posterior calcaneus).  She has been provided Tdap this evening per her request.

## 2020-10-16 NOTE — ED Notes (Signed)
Patient was upset that she was not given pain meds and was referred to Ortho for foot pain. Left mad and yelling at me. I advised her what Legrand Como said to soak foot and alternate Tylenol and Ibuprofen until she could see Dr Darene Lamer or [place] for further evaluation.

## 2020-10-17 NOTE — Progress Notes (Signed)
CPAP download 08/27/20-09/25/20 Usage days 10/30 (33%) Average usage days used 3 hours 33 mins  CPA pressure 13cm h20 AHI 2.8

## 2020-10-28 ENCOUNTER — Telehealth: Payer: Self-pay | Admitting: Primary Care

## 2020-10-28 NOTE — Telephone Encounter (Signed)
Called and spoke with patient. She stated that she called [employer] this morning to check on the status of her cpap machine. She was advised that [employer] had never received the order. She has received this message several times and feels like she is getting the run around with [employer].   She is wanting to switch to another DME company. I advised her that I would check with the PCCs to see if we can use the same order from 09/05/20 to send to another company. She verbalized understanding.   PCCs, please advise. Thanks!

## 2020-10-28 NOTE — Telephone Encounter (Signed)
Urgent message sent to [employer] asking them to check on the replacement cpap order placed on 09/05/20

## 2020-10-29 NOTE — Telephone Encounter (Signed)
See Leory Plowman from [employer]'s response below  New, Madilyn Fireman, Demetra Shiner, [place]; [place], Leory Plowman; Nash Shearer; Stephannie Peters Hello,   New order was received 10-14-20 and communication from your office was noted.  Several emails have been sent regarding it to our RT team.   The last note 10-14-20 states we called and left her a message for return call.    I will message the RT team again to ask for assistance in taking care of this patients needs. I will let you know what I hear.   Thanks,   [employer]

## 2020-10-31 ENCOUNTER — Ambulatory Visit: Payer: BC Managed Care – PPO | Admitting: Sports Medicine

## 2020-11-18 ENCOUNTER — Other Ambulatory Visit: Payer: Self-pay | Admitting: Gastroenterology

## 2020-11-19 NOTE — Telephone Encounter (Signed)
Noted. Sending to Dr. Elsworth Soho as an Juluis Rainier.

## 2020-11-19 NOTE — Telephone Encounter (Signed)
Response from [employer] see below  New, Kristine Garbe; Laneta Simmers, Melissa [place],   I received the following details on this patients' order.  We have been needing usage and benefit notes, I attempted to pull these but all the notes from the MD talk about patient needing to use her pap unit or talk about lack of usage. We contacted the patient to offer her a loaner unit to use to show compliance since her most recent 30 day and 90-day download shows she is not compliant ( 21% and 33%). At that time patient refused the loner unit,  requested [name] to void her order and to not contact her again.   The order has been voided at this time.

## 2020-11-25 ENCOUNTER — Telehealth: Payer: Self-pay | Admitting: Allergy

## 2020-11-25 NOTE — Telephone Encounter (Signed)
Patient states she would like to try [place] as Dr. Nelva Bush and her talked about to relieve her of her asthma symptoms. Please give patient a call back with next steps.

## 2020-11-25 NOTE — Telephone Encounter (Signed)
Please advise to pt starting dupixent

## 2020-11-27 NOTE — Telephone Encounter (Signed)
L/m for patient to return call to advise approval and submit for Dupixent

## 2020-11-27 NOTE — Telephone Encounter (Signed)
Spoke to patient and advised approval, copay card and submit. Also instructions for delivery, storage, dosing and make appt in clinic for fist dose and instructions

## 2020-11-28 ENCOUNTER — Ambulatory Visit: Payer: BC Managed Care – PPO | Admitting: Sports Medicine

## 2020-12-02 ENCOUNTER — Other Ambulatory Visit: Payer: Self-pay | Admitting: Pulmonary Disease

## 2020-12-03 ENCOUNTER — Encounter (HOSPITAL_COMMUNITY): Payer: Self-pay | Admitting: Gastroenterology

## 2020-12-06 ENCOUNTER — Other Ambulatory Visit: Payer: Self-pay

## 2020-12-06 ENCOUNTER — Ambulatory Visit (HOSPITAL_COMMUNITY): Payer: BC Managed Care – PPO | Admitting: Anesthesiology

## 2020-12-06 ENCOUNTER — Ambulatory Visit (HOSPITAL_COMMUNITY)
Admission: RE | Admit: 2020-12-06 | Discharge: 2020-12-06 | Disposition: A | Discharge status: Home / Self Care | Payer: BC Managed Care – PPO | Attending: Gastroenterology | Admitting: Gastroenterology

## 2020-12-06 ENCOUNTER — Encounter (HOSPITAL_COMMUNITY)
Admission: RE | Disposition: A | Discharge status: Home / Self Care | Payer: Self-pay | Source: Home / Self Care | Attending: Gastroenterology

## 2020-12-06 ENCOUNTER — Encounter (HOSPITAL_COMMUNITY): Payer: Self-pay | Admitting: Gastroenterology

## 2020-12-06 DIAGNOSIS — K5730 Diverticulosis of large intestine without perforation or abscess without bleeding: Secondary | ICD-10-CM | POA: Insufficient documentation

## 2020-12-06 DIAGNOSIS — Z882 Allergy status to sulfonamides status: Secondary | ICD-10-CM | POA: Insufficient documentation

## 2020-12-06 DIAGNOSIS — Z823 Family history of stroke: Secondary | ICD-10-CM | POA: Diagnosis not present

## 2020-12-06 DIAGNOSIS — Z888 Allergy status to other drugs, medicaments and biological substances status: Secondary | ICD-10-CM | POA: Insufficient documentation

## 2020-12-06 DIAGNOSIS — D122 Benign neoplasm of ascending colon: Secondary | ICD-10-CM | POA: Diagnosis not present

## 2020-12-06 DIAGNOSIS — Z8249 Family history of ischemic heart disease and other diseases of the circulatory system: Secondary | ICD-10-CM | POA: Diagnosis not present

## 2020-12-06 DIAGNOSIS — Z86711 Personal history of pulmonary embolism: Secondary | ICD-10-CM | POA: Diagnosis not present

## 2020-12-06 DIAGNOSIS — Z79899 Other long term (current) drug therapy: Secondary | ICD-10-CM | POA: Insufficient documentation

## 2020-12-06 DIAGNOSIS — Z86718 Personal history of other venous thrombosis and embolism: Secondary | ICD-10-CM | POA: Insufficient documentation

## 2020-12-06 DIAGNOSIS — Z885 Allergy status to narcotic agent status: Secondary | ICD-10-CM | POA: Diagnosis not present

## 2020-12-06 DIAGNOSIS — Z833 Family history of diabetes mellitus: Secondary | ICD-10-CM | POA: Insufficient documentation

## 2020-12-06 DIAGNOSIS — Z9884 Bariatric surgery status: Secondary | ICD-10-CM | POA: Insufficient documentation

## 2020-12-06 DIAGNOSIS — Z1211 Encounter for screening for malignant neoplasm of colon: Secondary | ICD-10-CM | POA: Diagnosis present

## 2020-12-06 DIAGNOSIS — Z8349 Family history of other endocrine, nutritional and metabolic diseases: Secondary | ICD-10-CM | POA: Diagnosis not present

## 2020-12-06 DIAGNOSIS — Z880 Allergy status to penicillin: Secondary | ICD-10-CM | POA: Insufficient documentation

## 2020-12-06 DIAGNOSIS — Z8261 Family history of arthritis: Secondary | ICD-10-CM | POA: Diagnosis not present

## 2020-12-06 HISTORY — PX: POLYPECTOMY: SHX5525

## 2020-12-06 HISTORY — PX: COLONOSCOPY WITH PROPOFOL: SHX5780

## 2020-12-06 SURGERY — COLONOSCOPY WITH PROPOFOL
Anesthesia: Monitor Anesthesia Care

## 2020-12-06 MED ORDER — SODIUM CHLORIDE 0.9 % IV SOLN: INTRAVENOUS | Status: DC

## 2020-12-06 MED ORDER — PROPOFOL 500 MG/50ML IV EMUL
INTRAVENOUS | Status: DC | PRN
  Administered 2020-12-06: 150 ug/kg/min via INTRAVENOUS

## 2020-12-06 MED ORDER — LIDOCAINE HCL (CARDIAC) PF 100 MG/5ML IV SOSY
PREFILLED_SYRINGE | INTRAVENOUS | Status: DC | PRN
  Administered 2020-12-06: 100 mg via INTRAVENOUS

## 2020-12-06 MED ORDER — LACTATED RINGERS IV SOLN: INTRAVENOUS | Status: DC

## 2020-12-06 SURGICAL SUPPLY — 22 items

## 2020-12-06 NOTE — Transfer of Care (Signed)
Immediate Anesthesia Transfer of Care Note  Patient: [name]  Procedure(s) Performed: COLONOSCOPY WITH PROPOFOL POLYPECTOMY  Patient Location: PACU  Anesthesia Type:MAC  Level of Consciousness: drowsy and patient cooperative  Airway & Oxygen Therapy: Patient Spontanous Breathing and Patient connected to face mask oxygen  Post-op Assessment: Report given to RN and Post -op Vital signs reviewed and stable  Post vital signs: Reviewed and stable  Last Vitals:  Vitals Value Taken Time  BP 73/58 12/06/20 1017  Temp    Pulse 91 12/06/20 1018  Resp 19 12/06/20 1018  SpO2 100 % 12/06/20 1018  Vitals shown include unvalidated device data.  Last Pain:  Vitals:   12/06/20 0906  TempSrc: Temporal  PainSc: 0-No pain         Complications: No notable events documented.

## 2020-12-06 NOTE — Anesthesia Preprocedure Evaluation (Addendum)
Anesthesia Evaluation  Patient identified by MRN, date of birth, ID band Patient awake    Reviewed: Allergy & Precautions, NPO status , Patient's Chart, lab work & pertinent test results  Airway Mallampati: II  TM Distance: >3 FB Neck ROM: Full    Dental  (+) Teeth Intact   Pulmonary asthma , sleep apnea and Continuous Positive Airway Pressure Ventilation ,  COPD inhaler, PE (on Xarelto)   Pulmonary exam normal        Cardiovascular hypertension, Pt. on medications + DVT   Rhythm:Regular Rate:Normal     Neuro/Psych  Headaches,    GI/Hepatic Neg liver ROS, GERD  Medicated,Screening    Endo/Other  diabetesHypothyroidism   Renal/GU   negative genitourinary   Musculoskeletal  (+) Arthritis , Osteoarthritis,    Abdominal (+)  Abdomen: soft.    Peds  Hematology  (+) anemia ,   Anesthesia Other Findings   Reproductive/Obstetrics                            Anesthesia Physical Anesthesia Plan  ASA: 3  Anesthesia Plan: MAC   Post-op Pain Management:    Induction: Intravenous  PONV Risk Score and Plan: 2 and Propofol infusion and Treatment may vary due to age or medical condition  Airway Management Planned: Simple Face Mask, Natural Airway and Nasal Cannula  Additional Equipment: None  Intra-op Plan:   Post-operative Plan:   Informed Consent: I have reviewed the patients History and Physical, chart, labs and discussed the procedure including the risks, benefits and alternatives for the proposed anesthesia with the patient or authorized representative who has indicated his/her understanding and acceptance.     Dental advisory given  Plan Discussed with: CRNA  Anesthesia Plan Comments: (  Lab Results      Component                Value               Date                      WBC                      5.1                 01/03/2020                HGB                      12.3                 01/03/2020                HCT                      37.6                01/03/2020                MCV                      81.3                01/03/2020                PLT  148.0 (L)           01/03/2020          )       Anesthesia Quick Evaluation

## 2020-12-06 NOTE — H&P (Signed)
[name] HPI:  This 60 year old black female presents to the office for colorectal cancer screening. She has been taking Omeprazole 40 mg per day since October 2021. She has 1-2 BM's per day with no obvious blood or mucus in the stool. She has good appetite and has gained 17 pounds over the last 2 years. She denies having any complaints of abdominal pain, nausea, vomiting, acid reflux, dysphagia or odynophagia. She denies having a family history of colon cancer, celiac sprue or IBD. Her last colonoscopy done on 10/03/2010 revealed scattered diverticulosis in the entire colon.   Past Medical History:  Diagnosis Date   Anemia    Anxiety    Arthritis    knee    Asthma    Back pain    Depression    Diabetes (HCC)    DVT (deep venous thrombosis) (HCC)    Dysfunctional uterine bleeding    Dyspnea    GERD (gastroesophageal reflux disease)    Glaucoma    Headache(784.0)    MIGRAINES IN PAST   Hypertension    Hypothyroidism    Joint pain    Lactose intolerance    Leg edema    Morbid obesity ([place])    Multiple food allergies    Optic neuropathy    Osteoarthritis    PE (pulmonary embolism)    Sleep apnea, obstructive    USES C-PAP    Past Surgical History:  Procedure Laterality Date   CAPSULOTOMY  05/03/2012   Procedure: MINOR CAPSULOTOMY;  Surgeon: Myrtha Mantis., MD;  Location: [place];  Service: Ophthalmology;  Laterality: Right;   CAPSULOTOMY  05/05/2012   Procedure: MINOR CAPSULOTOMY;  Surgeon: Myrtha Mantis., MD;  Location: [place];  Service: Ophthalmology;  Laterality: Right;   CATARACT SURG     DILATION AND CURETTAGE OF UTERUS     ENDOMETRIAL ABLATION     failure    EYE SURGERY  11/16/2009   FOR GLAUCOMA AND LASER SURGERY  X2   GASTRIC BYPASS  [id number]   GLAUCOMA SURGERY  10/31/2009   HERNIA REPAIR     HIATAL HERNIA REPAIR  01/30/2014   Procedure: HERNIA REPAIR HIATAL;  Surgeon: Pedro Earls, MD;  [facility];  Service: General;;    HYSTEROSCOPY     IVC FILTER     2010 - THEN REMOVED THEN PLACED AGAIN 2012   JOINT REPLACEMENT     KNEE ARTHROSCOPY  12/2007   LAPAROSCOPIC GASTRIC BANDING N/A 01/30/2014   Procedure: LAPAROSCOPIC GASTRIC BANDING over bypass ;  Surgeon: Pedro Earls, MD;  [facility];  Service: General;  Laterality: N/A;   POSTERIOR LAMINECTOMY / DECOMPRESSION CERVICAL SPINE  01/26/2011   TOTAL KNEE ARTHROPLASTY  09/09/2011   Procedure: TOTAL KNEE ARTHROPLASTY;  Surgeon: Johnn Hai, MD;  [facility];  Service: Orthopedics;  Laterality: Left;   TOTAL KNEE ARTHROPLASTY Right 03/22/2014   Procedure: RIGHT TOTAL KNEE ARTHROPLASTY;  Surgeon: Johnn Hai, MD;  [facility];  Service: Orthopedics;  Laterality: Right;   UPPER GI ENDOSCOPY N/A 01/30/2014   Procedure: UPPER GI ENDOSCOPY;  Surgeon: Pedro Earls, MD;  [facility];  Service: General;  Laterality: N/A;   YAG LASER APPLICATION  [id number]   Procedure: YAG LASER APPLICATION;  Surgeon: Myrtha Mantis., MD;  Location: [place];  Service: Ophthalmology;  Laterality: Right;   YAG LASER APPLICATION  [id number]   Procedure: YAG LASER APPLICATION;  Surgeon: Melanie Crazier  Bobbye Charleston., MD;  Location: [place];  Service: Ophthalmology;  Laterality: Right;    Family History  Problem Relation Age of Onset   Arthritis Mother    Hypertension Mother    Diabetes Mother    Stroke Mother    Obesity Mother    Arthritis Father    Diabetes Father    Alcohol abuse Father    Hypertension Father    Sudden death Father    Glaucoma Other    Cerebral aneurysm Other    Stroke Other     Social History:  reports that she has never smoked. She has never used smokeless tobacco. She reports that she does not drink alcohol and does not use drugs.  Allergies:  Allergies  Allergen Reactions   Hydrocodone-Acetaminophen Itching and Rash   Beta Adrenergic Blockers Other (See Comments)    RED EYES AND CONGESTION   Penicillins Hives, Itching and  Swelling   Sulfa Antibiotics Hives    RED EYES/RED CHEST   Thimerosal Hives and Itching    Medications: Scheduled: Continuous:  sodium chloride     lactated ringers      No results found for this or any previous visit (from the past 24 hour(s)).   No results found.  ROS:  As stated above in the HPI otherwise negative.  Weight 88.5 kg, last menstrual period 07/26/2011.    PE: Gen: NAD, Alert and Oriented HEENT:  [place]/AT, EOMI Neck: Supple, no LAD Lungs: CTA Bilaterally CV: RRR without M/G/R ABD: Soft, NTND, +BS Ext: No C/C/E  Assessment/Plan: 1) Screening colonoscopy.  [name] D 12/06/2020, 8:57 AM

## 2020-12-06 NOTE — Anesthesia Postprocedure Evaluation (Signed)
Anesthesia Post Note  Patient: [name]  Procedure(s) Performed: COLONOSCOPY WITH PROPOFOL POLYPECTOMY     Patient location during evaluation: Endoscopy Anesthesia Type: MAC Level of consciousness: awake and alert Pain management: pain level controlled Vital Signs Assessment: post-procedure vital signs reviewed and stable Respiratory status: spontaneous breathing, nonlabored ventilation, respiratory function stable and patient connected to nasal cannula oxygen Cardiovascular status: stable and blood pressure returned to baseline Postop Assessment: no apparent nausea or vomiting Anesthetic complications: no   No notable events documented.  Last Vitals:  Vitals:   12/06/20 1030 12/06/20 1038  BP: 112/67 130/85  Pulse: 78 70  Resp: 16 16  Temp:    SpO2: 97% 100%    Last Pain:  Vitals:   12/06/20 1038  TempSrc:   PainSc: 0-No pain                 Belenda Cruise P Deniss Wormley

## 2020-12-06 NOTE — Op Note (Signed)
[facility] Patient Name: [name] Procedure Date: 12/06/2020 MRN: [id number] Attending MD: Carol Ada , MD Date of Birth: 04/23/1960 CSN: [id number] Age: 60 Admit Type: Outpatient Procedure:                Colonoscopy Indications:              Screening for colorectal malignant neoplasm Providers:                Carol Ada, MD, Carmie End, RN, Tyna Jaksch Technician Referring MD:              Medicines:                 Complications:            No immediate complications. Estimated Blood Loss:     Estimated blood loss: none. Procedure:                Pre-Anesthesia Assessment:                           - Prior to the procedure, a History and Physical                            was performed, and patient medications and                            allergies were reviewed. The patient's tolerance of                            previous anesthesia was also reviewed. The risks                            and benefits of the procedure and the sedation                            options and risks were discussed with the patient.                            All questions were answered, and informed consent                            was obtained. Prior Anticoagulants: The patient has                            taken no previous anticoagulant or antiplatelet                            agents. ASA Grade Assessment: III - A patient with                            severe systemic disease. After reviewing the risks                            and benefits,  the patient was deemed in                            satisfactory condition to undergo the procedure.                           - Sedation was administered by an anesthesia                            professional. Deep sedation was attained.                           After obtaining informed consent, the colonoscope                            was passed under direct vision. Throughout the                             procedure, the patient's blood pressure, pulse, and                            oxygen saturations were monitored continuously. The                            [id number] Olympus colonoscope was                            introduced through the anus and advanced to the the                            cecum, identified by appendiceal orifice and                            ileocecal valve. The colonoscopy was performed                            without difficulty. The patient tolerated the                            procedure well. The quality of the bowel                            preparation was good. The ileocecal valve,                            appendiceal orifice, and rectum were photographed. Scope In: 9:52:31 AM Scope Out: 10:11:00 AM Scope Withdrawal Time: 0 hours 14 minutes 17 seconds  Total Procedure Duration: 0 hours 18 minutes 29 seconds  Findings:      A 1 to 2 mm polyp was found in the ascending colon. The polyp was       sessile. The polyp was removed with a cold snare. Resection and       retrieval were complete.      A few small-mouthed diverticula were found in the sigmoid colon and  transverse colon. Impression:               - One 1 to 2 mm polyp in the ascending colon,                            removed with a cold snare. Resected and retrieved.                           - Diverticulosis in the sigmoid colon and in the                            transverse colon. Moderate Sedation:      Not Applicable - Patient had care per Anesthesia. Recommendation:           - Patient has a contact number available for                            emergencies. The signs and symptoms of potential                            delayed complications were discussed with the                            patient. Return to normal activities tomorrow.                            Written discharge instructions were provided to the                             patient.                           - Resume previous diet.                           - Continue present medications.                           - Await pathology results.                           - Repeat colonoscopy in 7-10 years for surveillance. Procedure Code(s):        --- Professional ---                           [id number], Colonoscopy, flexible; with removal of                            tumor(s), polyp(s), or other lesion(s) by snare                            technique Diagnosis Code(s):        --- Professional ---                           Z12.11, Encounter for screening for malignant  neoplasm of colon                           K63.5, Polyp of colon                           K57.30, Diverticulosis of large intestine without                            perforation or abscess without bleeding CPT copyright 2019 [employer]. All rights reserved. The codes documented in this report are preliminary and upon coder review may  be revised to meet current compliance requirements. Carol Ada, MD Carol Ada, MD 12/06/2020 10:15:43 AM This report has been signed electronically. Number of Addenda: 0

## 2020-12-06 NOTE — Discharge Instructions (Signed)

## 2020-12-09 ENCOUNTER — Encounter (HOSPITAL_COMMUNITY): Payer: Self-pay | Admitting: Gastroenterology

## 2020-12-09 LAB — SURGICAL PATHOLOGY

## 2020-12-11 ENCOUNTER — Other Ambulatory Visit: Payer: Self-pay | Admitting: Allergy

## 2020-12-12 NOTE — Telephone Encounter (Signed)
Please advise 

## 2020-12-25 ENCOUNTER — Other Ambulatory Visit: Payer: Self-pay

## 2020-12-25 ENCOUNTER — Ambulatory Visit (INDEPENDENT_AMBULATORY_CARE_PROVIDER_SITE_OTHER): Payer: BC Managed Care – PPO

## 2020-12-25 DIAGNOSIS — J4540 Moderate persistent asthma, uncomplicated: Secondary | ICD-10-CM

## 2020-12-25 MED ORDER — DUPILUMAB 300 MG/2ML ~~LOC~~ SOSY
300.0000 mg | PREFILLED_SYRINGE | Freq: Once | SUBCUTANEOUS | Status: AC
  Administered 2020-12-25: 300 mg via SUBCUTANEOUS

## 2020-12-25 NOTE — Progress Notes (Signed)
Dupixent 600 mg loading dose given today. Lot# [id number] 05/22/23. 300 mg-given by me and 300 mg-given by patient. She is going to start given injection to herself at home. Instruction given and patient was shown how and where to given injection.

## 2021-01-06 ENCOUNTER — Encounter (INDEPENDENT_AMBULATORY_CARE_PROVIDER_SITE_OTHER): Payer: BC Managed Care – PPO | Admitting: Ophthalmology

## 2021-01-07 ENCOUNTER — Encounter (INDEPENDENT_AMBULATORY_CARE_PROVIDER_SITE_OTHER): Payer: Self-pay | Admitting: Ophthalmology

## 2021-01-07 ENCOUNTER — Other Ambulatory Visit: Payer: Self-pay

## 2021-01-07 ENCOUNTER — Ambulatory Visit (INDEPENDENT_AMBULATORY_CARE_PROVIDER_SITE_OTHER): Payer: BC Managed Care – PPO | Admitting: Ophthalmology

## 2021-01-07 DIAGNOSIS — H401133 Primary open-angle glaucoma, bilateral, severe stage: Secondary | ICD-10-CM

## 2021-01-07 DIAGNOSIS — H3522 Other non-diabetic proliferative retinopathy, left eye: Secondary | ICD-10-CM

## 2021-01-07 DIAGNOSIS — E119 Type 2 diabetes mellitus without complications: Secondary | ICD-10-CM | POA: Diagnosis not present

## 2021-01-07 DIAGNOSIS — H348121 Central retinal vein occlusion, left eye, with retinal neovascularization: Secondary | ICD-10-CM

## 2021-01-07 NOTE — Assessment & Plan Note (Signed)
OS condition stable status post sector PRP, CSME component is also resolved and not recurred

## 2021-01-07 NOTE — Assessment & Plan Note (Signed)
Follow-up with Dr. Marshall Cork as scheduled

## 2021-01-07 NOTE — Assessment & Plan Note (Signed)
Stable from the past, no no cause of vitreous hemorrhage now and this is resolved

## 2021-01-07 NOTE — Assessment & Plan Note (Signed)
No detectable diabetic retinopathy 

## 2021-01-07 NOTE — Progress Notes (Signed)
01/07/2021     CHIEF COMPLAINT Patient presents for  Chief Complaint  Patient presents with   Retina Follow Up      HISTORY OF PRESENT ILLNESS: [name] is a 60 y.o. female who presents to the clinic today for:   HPI     Retina Follow Up   Patient presents with  Other.  In both eyes.  This started 9 months ago.  Duration of 9 months.        Comments   9 month f/u OU with FP and OCT  Pt denies any visual changes since previous visit. Pt denies any new flashes or floaters. Pt denies any eye pain.  Eye Meds: Lumigan QHS OU Azopt TID OU Combigan TID OU Rhopressa QAM OD Pataday PRN OU      Last edited by Reather Littler, COA on 01/07/2021 10:03 AM.      Referring physician: Hortencia Pilar, MD [place],  [place] [address]  HISTORICAL INFORMATION:   Selected notes from the MEDICAL RECORD NUMBER    Lab Results  Component Value Date   HGBA1C 5.5 11/04/2017     CURRENT MEDICATIONS: Current Outpatient Medications (Ophthalmic Drugs)  Medication Sig   bimatoprost (LUMIGAN) 0.01 % SOLN Place 1 drop into both eyes at bedtime.   brinzolamide (AZOPT) 1 % ophthalmic suspension Place 1 drop into both eyes 3 (three) times daily.   COMBIGAN 0.2-0.5 % ophthalmic solution Place 1 drop into both eyes 3 (three) times daily.   Olopatadine HCl 0.2 % SOLN Apply 1 drop to eye daily as needed (Itchy, watery eyes). (Patient taking differently: Apply 1 drop to eye 2 (two) times daily as needed (Itchy, watery eyes).)   prednisoLONE acetate (PRED FORTE) 1 % ophthalmic suspension Place 1 drop into the left eye daily as needed (inflammation. (Max 2-3 days/week)).   Propylene Glycol 0.6 % SOLN Place 1 drop into both eyes 3 (three) times daily as needed.   RHOPRESSA 0.02 % SOLN Place 1 drop into the right eye in the morning.   No current facility-administered medications for this visit. (Ophthalmic Drugs)   Current Outpatient Medications (Other)   Medication Sig   acetaminophen (TYLENOL) 500 MG tablet Take 1,000 mg by mouth daily as needed (pain.).   Ascorbic Acid (VITAMIN C) 1000 MG tablet Take 1,000 mg by mouth daily as needed (immune support). Emergen C   azelastine (ASTELIN) 0.1 % nasal spray Place 2 sprays into both nostrils 2 (two) times daily as needed (nasal drainage). Use in each nostril as directed   B Complex-Folic Acid (B COMPLEX FORMULA 1 PO) Take 1 drop by mouth in the morning.   BEE POLLEN PO Take 1 tablet by mouth in the morning and at bedtime. Bee Extract   BLACK ELDERBERRY PO Take 4.4 g by mouth in the morning.   Calcium Carbonate-Vit D-Min (CALTRATE 600+D PLUS MINERALS PO) Take 2 tablets by mouth 3 (three) times daily as needed (bone health).   Cyanocobalamin (VITAMIN B-12) 2500 MCG SUBL Place 5,000 mcg under the tongue at bedtime.   diltiazem (CARDIZEM CD) 240 MG 24 hr capsule Take 240 mg by mouth at bedtime.   Docosahexaenoic Acid (DHA PO) Take 2 tablets by mouth at bedtime. DHA [employer]   DUPIXENT 300 MG/2ML prefilled syringe INJECT 2 SYRINGES UNDER THE SKIN ON DAY 1, THEN INJECT 1 SYRINGE ON DAY 15, THEN 1 SYRINGE EVERY OTHER WEEK THEREAFTER   fexofenadine (ALLEGRA) 180 MG  tablet Take 180 mg by mouth daily as needed for allergies.   furosemide (LASIX) 40 MG tablet Take 40 mg by mouth daily as needed (fluid retention).   levalbuterol (XOPENEX HFA) 45 MCG/ACT inhaler Inhale 2 puffs into the lungs every 4 (four) hours as needed.   levalbuterol (XOPENEX) 1.25 MG/3ML nebulizer solution Take 1.25 mg by nebulization every 6 (six) hours as needed for wheezing or shortness of breath.   levocetirizine (XYZAL) 5 MG tablet Take 1 tablet (5 mg total) by mouth every evening. (Patient taking differently: Take 5 mg by mouth daily as needed for allergies.)   loratadine-pseudoephedrine (ALAVERT D-12 HOUR ALLERGY/CONG) 5-120 MG tablet Take 1 tablet by mouth 2 (two) times daily as needed for allergies.   magnesium chloride  (SLOW-MAG) 64 MG TBEC Take 1-2 tablets by mouth See admin instructions. Take 2 tablets by mouth every morning and 1 tablet by mouth with dinner   meloxicam (MOBIC) 7.5 MG tablet Take 7.5 mg by mouth daily as needed for pain.   mometasone (NASONEX) 50 MCG/ACT nasal spray SHAKE LIQUID AND USE 2 SPRAYS IN EACH NOSTRIL EVERY DAY (Patient taking differently: Place 1-2 sprays into the nose 2 (two) times daily as needed (allergies.).)   montelukast (SINGULAIR) 10 MG tablet Take 1 tablet (10 mg total) by mouth at bedtime.   Multiple Vitamin (MULTIVITAMIN WITH MINERALS) TABS tablet Take 2 tablets by mouth every evening.   omeprazole (PRILOSEC) 20 MG capsule Take 1 capsule (20 mg total) by mouth daily.   OREGANO PO Take 0.15 mLs by mouth in the morning and at bedtime. Oil of Oregano   potassium chloride SA (K-DUR,KLOR-CON) 20 MEQ tablet Take 20-40 mEq by mouth See admin instructions. Take 2 tablets (40 meq) by mouth with breakfast and 1 tablet (20 meq) by mouth with dinner   pyridOXINE (VITAMIN B-6) 100 MG tablet Take 200 mg by mouth at bedtime.   quiNINE (QUALAQUIN) 324 MG capsule Take 324 mg by mouth at bedtime.    rivaroxaban (XARELTO) 20 MG TABS tablet Take 20 mg by mouth at bedtime.    Sodium Chloride-Sodium Bicarb (NETI POT SINUS WASH NA) Place 1 application into the nose daily as needed (congestion).   TRELEGY ELLIPTA 200-62.5-25 MCG/INH AEPB INHALE 1 PUFF INTO THE LUNGS DAILY (Patient taking differently: Inhale 1 puff into the lungs every evening.)   valsartan-hydrochlorothiazide (DIOVAN-HCT) 80-12.5 MG per tablet Take 1 tablet by mouth in the morning. MID MORNING   Vitamin D, Ergocalciferol, (DRISDOL) 50000 UNITS CAPS capsule Take 50,000 Units by mouth every Friday at 6 PM.   zinc sulfate 220 (50 Zn) MG capsule Take 220 mg by mouth in the morning. Mid morning   No current facility-administered medications for this visit. (Other)      REVIEW OF SYSTEMS:    ALLERGIES Allergies  Allergen  Reactions   Hydrocodone-Acetaminophen Itching and Rash   Beta Adrenergic Blockers Other (See Comments)    RED EYES AND CONGESTION   Penicillins Hives, Itching and Swelling   Sulfa Antibiotics Hives    RED EYES/RED CHEST   Thimerosal Hives and Itching    PAST MEDICAL HISTORY Past Medical History:  Diagnosis Date   Anemia    Anxiety    Arthritis    knee    Asthma    Back pain    Depression    Diabetes (HCC)    DVT (deep venous thrombosis) (HCC)    Dysfunctional uterine bleeding    Dyspnea    GERD (gastroesophageal reflux  disease)    Glaucoma    Headache(784.0)    MIGRAINES IN PAST   Hypertension    Hypothyroidism    Joint pain    Lactose intolerance    Leg edema    Morbid obesity ([place])    Multiple food allergies    Optic neuropathy    Osteoarthritis    PE (pulmonary embolism)    Sleep apnea, obstructive    USES C-PAP   Vitreous hemorrhage of left eye ([place]) 11/13/2019   Past Surgical History:  Procedure Laterality Date   CAPSULOTOMY  05/03/2012   Procedure: MINOR CAPSULOTOMY;  Surgeon: Myrtha Mantis., MD;  Location: [place];  Service: Ophthalmology;  Laterality: Right;   CAPSULOTOMY  05/05/2012   Procedure: MINOR CAPSULOTOMY;  Surgeon: Myrtha Mantis., MD;  Location: [place];  Service: Ophthalmology;  Laterality: Right;   CATARACT SURG     COLONOSCOPY WITH PROPOFOL N/A 12/06/2020   Procedure: COLONOSCOPY WITH PROPOFOL;  Surgeon: Carol Ada, MD;  Location: [facility];  Service: Endoscopy;  Laterality: N/A;   DILATION AND CURETTAGE OF UTERUS     ENDOMETRIAL ABLATION     failure    EYE SURGERY  11/16/2009   FOR GLAUCOMA AND LASER SURGERY  X2   GASTRIC BYPASS  [id number]   GLAUCOMA SURGERY  10/31/2009   HERNIA REPAIR     HIATAL HERNIA REPAIR  01/30/2014   Procedure: HERNIA REPAIR HIATAL;  Surgeon: Pedro Earls, MD;  Location: [facility];  Service: General;;   HYSTEROSCOPY     IVC FILTER     2010 - THEN REMOVED THEN PLACED AGAIN 2012   JOINT  REPLACEMENT     KNEE ARTHROSCOPY  12/2007   LAPAROSCOPIC GASTRIC BANDING N/A 01/30/2014   Procedure: LAPAROSCOPIC GASTRIC BANDING over bypass ;  Surgeon: Pedro Earls, MD;  Location: [facility];  Service: General;  Laterality: N/A;   POLYPECTOMY  12/06/2020   Procedure: POLYPECTOMY;  Surgeon: Carol Ada, MD;  Location: [facility];  Service: Endoscopy;;   POSTERIOR LAMINECTOMY / DECOMPRESSION CERVICAL SPINE  01/26/2011   TOTAL KNEE ARTHROPLASTY  09/09/2011   Procedure: TOTAL KNEE ARTHROPLASTY;  Surgeon: Johnn Hai, MD;  Location: [facility];  Service: Orthopedics;  Laterality: Left;   TOTAL KNEE ARTHROPLASTY Right 03/22/2014   Procedure: RIGHT TOTAL KNEE ARTHROPLASTY;  Surgeon: Johnn Hai, MD;  Location: [facility];  Service: Orthopedics;  Laterality: Right;   UPPER GI ENDOSCOPY N/A 01/30/2014   Procedure: UPPER GI ENDOSCOPY;  Surgeon: Pedro Earls, MD;  Location: [facility];  Service: General;  Laterality: N/A;   YAG LASER APPLICATION  1/44/3154   Procedure: YAG LASER APPLICATION;  Surgeon: Myrtha Mantis., MD;  Location: [place];  Service: Ophthalmology;  Laterality: Right;   YAG LASER APPLICATION  0/11/6759   Procedure: YAG LASER APPLICATION;  Surgeon: Myrtha Mantis., MD;  Location: [place];  Service: Ophthalmology;  Laterality: Right;    FAMILY HISTORY Family History  Problem Relation Age of Onset   Arthritis Mother    Hypertension Mother    Diabetes Mother    Stroke Mother    Obesity Mother    Arthritis Father    Diabetes Father    Alcohol abuse Father    Hypertension Father    Sudden death Father    Glaucoma Other    Cerebral aneurysm Other    Stroke Other     SOCIAL HISTORY Social History   Tobacco Use   Smoking status:  Never   Smokeless tobacco: Never  Vaping Use   Vaping Use: Never used  Substance Use Topics   Alcohol use: No   Drug use: No         OPHTHALMIC EXAM:  Base Eye Exam     Visual Acuity (ETDRS)       Right Left   Dist [place]  20/50 20/20 -1   Dist ph [place] 20/30 -1          Tonometry (Tonopen, 10:11 AM)       Right Left   Pressure 17 12         Pupils       Dark Light Shape React APD   Right 5 5 Round Minimal None   Left 4 4 Round Minimal None         Visual Fields (Counting fingers)       Left Right    Full Full         Extraocular Movement       Right Left    Full, Ortho Full, Ortho         Neuro/Psych     Oriented x3: Yes   Mood/Affect: Normal         Dilation     Both eyes: 1.0% Mydriacyl, 2.5% Phenylephrine @ 10:11 AM           Slit Lamp and Fundus Exam     External Exam       Right Left   External Normal Normal         Slit Lamp Exam       Right Left   Lids/Lashes Normal Normal   Conjunctiva/Sclera White and quiet White and quiet   Cornea Clear Clear   Anterior Chamber Deep and quiet Deep and quiet   Iris Round and reactive Round and reactive   Lens Centered posterior chamber intraocular lens Centered posterior chamber intraocular lens   Anterior Vitreous Normal Normal         Fundus Exam       Right Left   Posterior Vitreous Normal Vitrectomized   Disc Advanced cupping, still pink, 2+ Pallor Advanced cupping, still pink   C/D Ratio 0.9 0.85   Macula Microaneurysms, no macular thickening Microaneurysms, no macular thickening   Vessels Old central retinal vein occlusion, collateralized Old central retinal vein occlusion, collateralized   Periphery Laser scars Laser scars            IMAGING AND PROCEDURES  Imaging and Procedures for 01/07/21  OCT, Retina - OU - Both Eyes       Right Eye Quality was good. Scan locations included subfoveal. Central Foveal Thickness: 280. Progression has been stable. Findings include abnormal foveal contour.   Left Eye Quality was good. Scan locations included subfoveal. Central Foveal Thickness: 300. Progression has been stable. Findings include abnormal foveal contour, cystoid macular edema.    Notes Diffuse retinal atrophy macula OD.  OS with small focal region of thickening superior to the fovea, new affecting the center of the vision.  Will observe this and if CME worsens will consider treatment intravitreal     Color Fundus Photography Optos - OU - Both Eyes       Right Eye Progression has been stable. Disc findings include increased cup to disc ratio. Macula : normal observations. Vessels : normal observations. Periphery : normal observations.   Left Eye Progression has been stable. Disc findings include increased cup to disc ratio. Macula : microaneurysms.  Vessels : normal observations. Periphery : normal observations.   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 Marshall Cork as scheduled     ICD-10-CM   1. 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 Nondiabetic proliferative retinopathy, left  H35.22     3. Type 2 diabetes mellitus without complication, without long-term current use of insulin (HCC)  E11.9     4. Primary open angle glaucoma of both eyes, severe stage  H40.1133       1.  OU stabilized overall.  Stable acuity  2.  No signs of progression of prior  Hemi central vein occlusion left eye and no signs of proliferative retinopathy remaining post vitrectomy and clearance of vitreous hemorrhage.  Post PRP.  3.  Also follow-up with Dr. Marshall Cork as scheduled  Ophthalmic Meds Ordered this visit:  No orders of the defined types were placed in this encounter.      Return in about 1 year (around 01/07/2022) for DILATE OU, COLOR FP, OCT.  There are no Patient Instructions on file for this visit.   Explained the diagnoses, plan, and follow up with the patient and they expressed understanding.  Patient expressed understanding of the importance of proper follow up care.   Clent Demark Emet Rafanan M.D. Diseases & Surgery of the Retina and Vitreous Retina & Diabetic [place] 01/07/21     Abbreviations: M myopia (nearsighted); A astigmatism; H hyperopia (farsighted); P presbyopia; Mrx spectacle prescription;  CTL contact lenses; OD right eye; OS left eye; OU both eyes  XT exotropia; ET esotropia; PEK punctate epithelial keratitis; PEE punctate epithelial erosions; DES dry eye syndrome; MGD meibomian gland dysfunction; ATs artificial tears; PFAT's preservative free artificial tears; [place] nuclear sclerotic cataract; PSC posterior subcapsular cataract; ERM epi-retinal membrane; PVD posterior vitreous detachment; RD retinal detachment; DM diabetes mellitus; DR diabetic retinopathy; NPDR non-proliferative diabetic retinopathy; PDR proliferative diabetic retinopathy; CSME clinically significant macular edema; DME diabetic macular edema; dbh dot blot hemorrhages; CWS cotton wool spot; POAG primary open angle glaucoma; C/D cup-to-disc ratio; HVF humphrey visual field; GVF goldmann visual field; OCT optical coherence tomography; IOP intraocular pressure; BRVO Branch retinal vein occlusion; CRVO central retinal vein occlusion; CRAO central retinal artery occlusion; BRAO branch retinal artery occlusion; RT retinal tear; SB scleral buckle; PPV pars plana vitrectomy;  VH Vitreous hemorrhage; PRP panretinal laser photocoagulation; IVK intravitreal kenalog; VMT vitreomacular traction; MH Macular hole;  NVD neovascularization of the disc; NVE neovascularization elsewhere; AREDS age related eye disease study; ARMD age related macular degeneration; POAG primary open angle glaucoma; EBMD epithelial/anterior basement membrane dystrophy; ACIOL anterior chamber intraocular lens; IOL intraocular lens; PCIOL posterior chamber intraocular lens; Phaco/IOL phacoemulsification with intraocular lens placement; PRK photorefractive  keratectomy; LASIK laser assisted in situ keratomileusis; HTN hypertension; DM diabetes mellitus; COPD chronic obstructive pulmonary disease

## 2021-01-28 ENCOUNTER — Telehealth: Payer: Self-pay | Admitting: Primary Care

## 2021-01-28 NOTE — Telephone Encounter (Signed)
Error

## 2021-02-05 ENCOUNTER — Other Ambulatory Visit: Payer: Self-pay

## 2021-02-05 ENCOUNTER — Ambulatory Visit: Payer: BC Managed Care – PPO | Admitting: Allergy

## 2021-02-05 ENCOUNTER — Encounter: Payer: Self-pay | Admitting: Allergy

## 2021-02-05 VITALS — BP 132/86 | HR 74 | Temp 96.7°F | Resp 16 | Ht 64.0 in | Wt 306.2 lb

## 2021-02-05 DIAGNOSIS — J3089 Other allergic rhinitis: Secondary | ICD-10-CM

## 2021-02-05 DIAGNOSIS — J4540 Moderate persistent asthma, uncomplicated: Secondary | ICD-10-CM

## 2021-02-05 DIAGNOSIS — H1013 Acute atopic conjunctivitis, bilateral: Secondary | ICD-10-CM | POA: Diagnosis not present

## 2021-02-05 DIAGNOSIS — K219 Gastro-esophageal reflux disease without esophagitis: Secondary | ICD-10-CM | POA: Diagnosis not present

## 2021-02-05 NOTE — Patient Instructions (Addendum)
Asthma - continue Trelegy 243mcg 1 inhalation once a day.  Rinse mouth after use - continue Singulair 10mg  daily - have access to Xopenex inhaler 2 puffs every 4-6 hours as needed for cough/wheeze/shortness of breath/chest tightness.  May use 15-20 minutes prior to activity.   Monitor frequency of use.   -hold Dupixent until you see your eye specialist, Dr. Kathlen Mody  on 02/13/2021 and if ok to manage Dupixent-induced conjunctivitis with steroid eye drop then that is usually the treatment plan to be able to remain on [place].  If this is not an option for you then will need to change Dupixent to different anti-eosinophilic asthma medication like Fasenra or Nucala  Asthma control goals:  Full participation in all desired activities (may need albuterol before activity) Albuterol use two time or less a week on average (not counting use with activity) Cough interfering with sleep two time or less a month Oral steroids no more than once a year No hospitalizations  Allergies  - continue your allergen avoidance measures  - continue use of Xyzal 5mg  daily   - you may take additional antihistamine (Allegra) dose in a day if needed for itch  - singulair as above helps also with allergy symptom control  - for itchy/watery eyes can continue use of as needed Pazeo or Pataday  - for nasal congestion/stuffiness use of Nasonex or Nasacort 1-2 sprays each nostril daily as needed  - for nasal drainage/post-nasal drip use Astelin 2 sprays each nostril 2 times a day for symptom control  Reflux  - reflux symptoms can be triggers of asthma symptoms  - you have had adverse symptoms with Famotidine thus will not recommend use  - continue use of Omeprazole as directed for reflux control  Follow-up 4 months or sooner if needed

## 2021-02-05 NOTE — Progress Notes (Signed)
Follow-up Note  RE: [name] MRN: [id number] DOB: 11-18-1960 Date of Office Visit: 02/05/2021   History of present illness: [name] is a 60 y.o. female presenting today for follow-up of asthma, allergic rhinitis and conjunctivitis, reflux.  She was last seen in the office on 10/03/2020 by myself. After this visit she was started on Dupixent injections however she states every time she receives injection she is having issues with her eyes.  She states they get bloodshot, blurry and her vision is "not as good" and she does note itching.   She notices it more so with right eye than left eye.  She has seen her retina specialist recently who advised could have conjunctivitis related to dupixent use.   She states she was recommended to then follow-up with her glaucoma specialist Dr. Kathlen Mody who she is seeing on 02/13/2021.  She states when she has had these issues after the [place] she does have a steroid eyedrop at home that she has used and did see improvement in the symptoms with use.  She does not necessarily want to stop Dupixent as she has seen an improvement in her asthma control since being on it.  She has not needed to use her rescue inhaler since starting Dupixent.  She has not required any systemic steroids or ED or urgent care visits in regards to her asthma.  She does continue to take Trelegy 200 mcg 1 puff once a day and singular daily. She does continue to take Xyzal daily as she does believe it is still helpful for her overall allergy symptom control. She has not needed to use much of her nasal sprays nasal steroid spray and or Astelin spray.  She will use these as needed. She does folic she has been having more itchy skin at the weather is getting colder. She does continue to take omeprazole for reflux control. She has been having vertigo and has been taking Dramamine over the past several days.  Review of systems: Review of Systems  Constitutional: Negative.   HENT:  Negative.    Eyes:  Positive for blurred vision and redness.  Respiratory: Negative.    Gastrointestinal: Negative.   Musculoskeletal: Negative.   Skin:  Positive for itching. Negative for rash.  Neurological:  Positive for dizziness.   All other systems negative unless noted above in HPI  Past medical/social/surgical/family history have been reviewed and are unchanged unless specifically indicated below.  No changes  Medication List: Current Outpatient Medications  Medication Sig Dispense Refill   acetaminophen (TYLENOL) 500 MG tablet Take 1,000 mg by mouth daily as needed (pain.).     Ascorbic Acid (VITAMIN C) 1000 MG tablet Take 1,000 mg by mouth daily as needed (immune support). Emergen C     azelastine (ASTELIN) 0.1 % nasal spray Place 2 sprays into both nostrils 2 (two) times daily as needed (nasal drainage). Use in each nostril as directed 30 mL 5   B Complex-Folic Acid (B COMPLEX FORMULA 1 PO) Take 1 drop by mouth in the morning.     BEE POLLEN PO Take 1 tablet by mouth in the morning and at bedtime. Bee Extract     bimatoprost (LUMIGAN) 0.01 % SOLN Place 1 drop into both eyes at bedtime.     BLACK ELDERBERRY PO Take 4.4 g by mouth in the morning.     brinzolamide (AZOPT) 1 % ophthalmic suspension Place 1 drop into both eyes 3 (three) times daily.  Calcium Carbonate-Vit D-Min (CALTRATE 600+D PLUS MINERALS PO) Take 2 tablets by mouth 3 (three) times daily as needed (bone health).     COMBIGAN 0.2-0.5 % ophthalmic solution Place 1 drop into both eyes 3 (three) times daily.     Cyanocobalamin (VITAMIN B-12) 2500 MCG SUBL Place 5,000 mcg under the tongue at bedtime.     diltiazem (CARDIZEM CD) 240 MG 24 hr capsule Take 240 mg by mouth at bedtime.     Docosahexaenoic Acid (DHA PO) Take 2 tablets by mouth at bedtime. DHA Nordic Naturals     DUPIXENT 300 MG/2ML prefilled syringe INJECT 2 SYRINGES UNDER THE SKIN ON DAY 1, THEN INJECT 1 SYRINGE ON DAY 15, THEN 1 SYRINGE EVERY OTHER  WEEK THEREAFTER 4 mL 00   fexofenadine (ALLEGRA) 180 MG tablet Take 180 mg by mouth daily as needed for allergies.     furosemide (LASIX) 40 MG tablet Take 40 mg by mouth daily as needed (fluid retention).     levalbuterol (XOPENEX HFA) 45 MCG/ACT inhaler Inhale 2 puffs into the lungs every 4 (four) hours as needed. 15 g 1   levalbuterol (XOPENEX) 1.25 MG/3ML nebulizer solution Take 1.25 mg by nebulization every 6 (six) hours as needed for wheezing or shortness of breath.     levocetirizine (XYZAL) 5 MG tablet Take 1 tablet (5 mg total) by mouth every evening. (Patient taking differently: Take 5 mg by mouth daily as needed for allergies.) 30 tablet 5   loratadine-pseudoephedrine (ALAVERT D-12 HOUR ALLERGY/CONG) 5-120 MG tablet Take 1 tablet by mouth 2 (two) times daily as needed for allergies.     magnesium chloride (SLOW-MAG) 64 MG TBEC Take 1-2 tablets by mouth See admin instructions. Take 2 tablets by mouth every morning and 1 tablet by mouth with dinner     meloxicam (MOBIC) 7.5 MG tablet Take 7.5 mg by mouth daily as needed for pain.     mometasone (NASONEX) 50 MCG/ACT nasal spray SHAKE LIQUID AND USE 2 SPRAYS IN EACH NOSTRIL EVERY DAY (Patient taking differently: Place 1-2 sprays into the nose 2 (two) times daily as needed (allergies.).) 17 g 3   montelukast (SINGULAIR) 10 MG tablet Take 1 tablet (10 mg total) by mouth at bedtime. 30 tablet 5   Multiple Vitamin (MULTIVITAMIN WITH MINERALS) TABS tablet Take 2 tablets by mouth every evening.     Olopatadine HCl 0.2 % SOLN Apply 1 drop to eye daily as needed (Itchy, watery eyes). (Patient taking differently: Apply 1 drop to eye 2 (two) times daily as needed (Itchy, watery eyes).) 2.5 mL 3   omeprazole (PRILOSEC) 20 MG capsule Take 1 capsule (20 mg total) by mouth daily. 30 capsule 5   OREGANO PO Take 0.15 mLs by mouth in the morning and at bedtime. Oil of Oregano     potassium chloride SA (K-DUR,KLOR-CON) 20 MEQ tablet Take 20-40 mEq by mouth See  admin instructions. Take 2 tablets (40 meq) by mouth with breakfast and 1 tablet (20 meq) by mouth with dinner     prednisoLONE acetate (PRED FORTE) 1 % ophthalmic suspension Place 1 drop into the left eye daily as needed (inflammation. (Max 2-3 days/week)).     Propylene Glycol 0.6 % SOLN Place 1 drop into both eyes 3 (three) times daily as needed.     pyridOXINE (VITAMIN B-6) 100 MG tablet Take 200 mg by mouth at bedtime.     quiNINE (QUALAQUIN) 324 MG capsule Take 324 mg by mouth at bedtime.  RHOPRESSA 0.02 % SOLN Place 1 drop into the right eye in the morning.     rivaroxaban (XARELTO) 20 MG TABS tablet Take 20 mg by mouth at bedtime.      Sodium Chloride-Sodium Bicarb (NETI POT SINUS WASH NA) Place 1 application into the nose daily as needed (congestion).     TRELEGY ELLIPTA 200-62.5-25 MCG/INH AEPB INHALE 1 PUFF INTO THE LUNGS DAILY (Patient taking differently: Inhale 1 puff into the lungs every evening.) 60 each 1   valsartan-hydrochlorothiazide (DIOVAN-HCT) 80-12.5 MG per tablet Take 1 tablet by mouth in the morning. MID MORNING     Vitamin D, Ergocalciferol, (DRISDOL) 50000 UNITS CAPS capsule Take 50,000 Units by mouth every Friday at 6 PM.     zinc sulfate 220 (50 Zn) MG capsule Take 220 mg by mouth in the morning. Mid morning     No current facility-administered medications for this visit.     Known medication allergies: Allergies  Allergen Reactions   Hydrocodone-Acetaminophen Itching and Rash   Beta Adrenergic Blockers Other (See Comments)    RED EYES AND CONGESTION   Penicillins Hives, Itching and Swelling   Sulfa Antibiotics Hives    RED EYES/RED CHEST   Thimerosal Hives and Itching     Physical examination: Blood pressure 132/86, pulse 74, temperature (!) 96.7 F (35.9 C), resp. rate 16, height 5\' 4"  (1.626 m), weight (!) 306 lb 3.2 oz (138.9 kg), last menstrual period 07/26/2011, SpO2 96 %.  General: Alert, interactive, in no acute distress. HEENT: PERRLA,TMs  pearly gray, turbinates non-edematous without discharge, post-pharynx non erythematous. Neck: Supple without lymphadenopathy. Lungs: Clear to auscultation without wheezing, rhonchi or rales. {no increased work of breathing. CV: Normal S1, S2 without murmurs. Abdomen: Nondistended, nontender. Skin: Warm and dry, without lesions or rashes. Extremities:  No clubbing, cyanosis or edema. Neuro:   Grossly intact.  Diagnositics/Labs: Spirometry: FEV1: 1.44 L 69%, FVC: 1.77L 67% predicted  Assessment and plan:   Asthma - continue Trelegy 256mcg 1 inhalation once a day.  Rinse mouth after use - continue Singulair 10mg  daily - have access to Xopenex inhaler 2 puffs every 4-6 hours as needed for cough/wheeze/shortness of breath/chest tightness.  May use 15-20 minutes prior to activity.   Monitor frequency of use.   -It is possible she could have Dupixent induced conjunctivitis however she does have some symptoms that is not consistent with Dupixent induced conjunctivitis including blurriness/vision change as well as itching.  Typically with Dupixent induced conjunctivitis we see redness of the conjunctiva only. This has been able to be treated with steroid based eyedrops in order to continue the [place] if having success with Dupixent for asthma/eczema use. -hold Dupixent until you see your eye specialist, Dr. Kathlen Mody  on 02/13/2021 and if ok to manage Dupixent-induced conjunctivitis with steroid eye drop then that is usually the treatment plan to be able to remain on [place].  If this is not an option for you then will need to change Dupixent to different anti-eosinophilic asthma medication like Fasenra or Nucala  Asthma control goals:  Full participation in all desired activities (may need albuterol before activity) Albuterol use two time or less a week on average (not counting use with activity) Cough interfering with sleep two time or less a month Oral steroids no more than once a year No  hospitalizations  Allergic rhinitis with conjunctivitis  - continue your allergen avoidance measures  - continue use of Xyzal 5mg  daily   - you may take additional antihistamine (  Allegra) dose in a day if needed for itch  - singulair as above helps also with allergy symptom control  - for itchy/watery eyes can continue use of as needed [place] or [place]  - for nasal congestion/stuffiness use of Nasonex or Nasacort 1-2 sprays each nostril daily as needed  - for nasal drainage/post-nasal drip use Astelin 2 sprays each nostril 2 times a day for symptom control  Reflux  - reflux symptoms can be triggers of asthma symptoms  - you have had adverse symptoms with Famotidine thus will not recommend use  - continue use of Omeprazole as directed for reflux control  Follow-up 4 months or sooner if needed  I appreciate the opportunity to take part in [name]'s care. Please do not hesitate to contact me with questions.  Sincerely,   Prudy Feeler, MD Allergy/Immunology [facility]

## 2021-02-19 ENCOUNTER — Other Ambulatory Visit: Payer: Self-pay | Admitting: Family Medicine

## 2021-02-19 ENCOUNTER — Other Ambulatory Visit (HOSPITAL_COMMUNITY)
Admission: RE | Admit: 2021-02-19 | Discharge: 2021-02-19 | Disposition: A | Discharge status: Home / Self Care | Payer: BC Managed Care – PPO | Source: Ambulatory Visit | Attending: Family Medicine | Admitting: Family Medicine

## 2021-02-19 DIAGNOSIS — N898 Other specified noninflammatory disorders of vagina: Secondary | ICD-10-CM | POA: Diagnosis not present

## 2021-02-21 LAB — MOLECULAR ANCILLARY ONLY
Bacterial Vaginitis (gardnerella): NEGATIVE
Candida Glabrata: NEGATIVE
Candida Vaginitis: NEGATIVE
Chlamydia: NEGATIVE
Comment: NEGATIVE
Comment: NEGATIVE
Comment: NEGATIVE
Comment: NEGATIVE
Comment: NEGATIVE
Comment: NORMAL
Neisseria Gonorrhea: NEGATIVE
Trichomonas: NEGATIVE

## 2021-03-06 ENCOUNTER — Other Ambulatory Visit: Payer: Self-pay | Admitting: Allergy

## 2021-04-18 ENCOUNTER — Other Ambulatory Visit: Payer: Self-pay | Admitting: Allergy

## 2021-04-21 ENCOUNTER — Other Ambulatory Visit: Payer: Self-pay | Admitting: Allergy

## 2021-05-01 ENCOUNTER — Ambulatory Visit: Payer: BC Managed Care – PPO | Admitting: Sports Medicine

## 2021-05-08 ENCOUNTER — Other Ambulatory Visit: Payer: Self-pay

## 2021-05-08 ENCOUNTER — Ambulatory Visit: Payer: BC Managed Care – PPO | Admitting: Sports Medicine

## 2021-05-08 DIAGNOSIS — B351 Tinea unguium: Secondary | ICD-10-CM

## 2021-05-08 DIAGNOSIS — M79674 Pain in right toe(s): Secondary | ICD-10-CM

## 2021-05-08 DIAGNOSIS — L84 Corns and callosities: Secondary | ICD-10-CM | POA: Diagnosis not present

## 2021-05-08 DIAGNOSIS — I890 Lymphedema, not elsewhere classified: Secondary | ICD-10-CM | POA: Diagnosis not present

## 2021-05-08 DIAGNOSIS — M79675 Pain in left toe(s): Secondary | ICD-10-CM

## 2021-05-08 NOTE — Progress Notes (Signed)
Subjective: Raimi Guillermo Cervenka is a 61 y.o. female patient seen today in office with complaint of mildly painful thickened and discolored nails. Patient is desiring treatment for nail changes; has tried OTC topicals/Medication in the past with no improvement. Reports that nails are becoming difficult to manage because of the thickness and is concerned about the discoloration that has worsened in the last year.  Patient also reports that she has a corn in between her right fourth and fifth toes had previous toe surgery many years ago but states that the corn has slowly started to come back. Patient has no other pedal complaints at this time.   Denies current diagnosis of diabetes not take any medications.  Patient Active Problem List   Diagnosis Date Noted   GERD (gastroesophageal reflux disease) 10/03/2019   Central retinal vein occlusion with neovascularization of left eye 09/04/2019   Primary open angle glaucoma of both eyes, severe stage 09/04/2019   Nondiabetic proliferative retinopathy, left 09/04/2019   Shortness of breath on exertion 11/04/2017   Type 2 diabetes mellitus without complication, without long-term current use of insulin ([place]) 11/04/2017   Essential hypertension 11/04/2017   Other headache syndrome 08/30/2014   Speech disturbance 08/30/2014   Vertigo 08/30/2014   Right knee DJD 03/22/2014   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 01/30/2014   Anemia 09/10/2011   ARF (acute renal failure) ([place]) 09/10/2011   Paresthesias 09/10/2011   Total knee replacement status, left 09/10/2011   Cervical spondylosis with myelopathy 06/30/2011   Osteoarthritis of both knees 06/30/2011   Diabetes mellitus    Arthritis    Sleep apnea, obstructive    Morbid obesity ([place])    Dysfunctional uterine bleeding    HEPATITIS C 01/25/2008   HYPERTENSION 01/25/2008   C 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     Ascorbic Acid (VITAMIN C) 1000 MG tablet Take 1,000 mg by mouth daily as needed (immune support). Emergen C     azelastine (ASTELIN) 0.1 % nasal spray Place 2 sprays into both nostrils 2 (two) times daily as needed (nasal drainage). Use in each nostril as directed 30 mL 5   B Complex-Folic Acid (B COMPLEX FORMULA 1 PO) Take 1 drop by mouth in the morning.     BEE POLLEN PO Take 1 tablet by mouth in the morning and at bedtime. Bee Extract     bimatoprost (LUMIGAN) 0.01 % SOLN Place 1 drop into both eyes at bedtime.     BLACK ELDERBERRY PO Take 4.4 g by mouth in the morning.     brinzolamide (AZOPT) 1 % ophthalmic suspension Place 1 drop into both eyes 3 (three) times daily.     Calcium Carbonate-Vit D-Min (CALTRATE 600+D PLUS MINERALS PO) Take 2 tablets by mouth 3 (three) times daily as needed (bone health).     COMBIGAN 0.2-0.5 % ophthalmic solution Place 1 drop into both eyes 3 (three) times daily.     Cyanocobalamin (VITAMIN B-12) 2500 MCG SUBL Place 5,000 mcg under the tongue at bedtime.     diltiazem (CARDIZEM CD) 240 MG 24 hr capsule Take 240 mg by mouth at bedtime.     Docosahexaenoic Acid (DHA PO) Take 2 tablets by mouth at bedtime.  DHA Nord[employer] Naturals     DUPIXENT 300 MG/2ML prefilled syringe INJECT 1 SYRINGE UNDER THE SKIN EVERY OTHER WEEK 4 mL 11   fexofenadine (ALLEGRA) 180 MG tablet Take 180 mg by mouth daily as needed for allergies.     furosemide (LASIX) 40 MG tablet Take 40 mg by mouth daily as needed (fluid retention).     levalbuterol (XOPENEX HFA) 45 MCG/ACT inhaler Inhale 2 puffs into the lungs every 4 (four) hours as needed. 15 g 1   levalbuterol (XOPENEX) 1.25 MG/3ML nebulizer solution Take 1.25 mg by nebulization every 6 (six) hours as needed for wheezing or shortness of breath.      levocetirizine (XYZAL) 5 MG tablet TAKE 1 TABLET(5 MG) BY MOUTH EVERY EVENING 90 tablet 0   loratadine-pseudoephedrine (ALAVERT D-12 HOUR ALLERGY/CONG) 5-120 MG tablet Take 1 tablet by mouth 2 (two) times daily as needed for allergies.     magnesium chloride (SLOW-MAG) 64 MG TBEC Take 1-2 tablets by mouth See admin instructions. Take 2 tablets by mouth every morning and 1 tablet by mouth with dinner     meloxicam (MOBIC) 7.5 MG tablet Take 7.5 mg by mouth daily as needed for pain.     mometasone (NASONEX) 50 MCG/ACT nasal spray SHAKE LIQUID AND USE 2 SPRAYS IN EACH NOSTRIL EVERY DAY (Patient taking differently: Place 1-2 sprays into the nose 2 (two) times daily as needed (allergies.).) 17 g 3   montelukast (SINGULAIR) 10 MG tablet Take 1 tablet (10 mg total) by mouth at bedtime. 30 tablet 5   Multiple Vitamin (MULTIVITAMIN WITH MINERALS) TABS tablet Take 2 tablets by mouth every evening.     Olopatadine HCl 0.2 % SOLN Apply 1 drop to eye daily as needed (Itchy, watery eyes). (Patient taking differently: Apply 1 drop to eye 2 (two) times daily as needed (Itchy, watery eyes).) 2.5 mL 3   omeprazole (PRILOSEC) 20 MG capsule Take 1 capsule (20 mg total) by mouth daily. 30 capsule 5   OREGANO PO Take 0.15 mLs by mouth in the morning and at bedtime. Oil of Oregano     potassium chloride SA (K-DUR,KLOR-CON) 20 MEQ tablet Take 20-40 mEq by mouth See admin instructions. Take 2 tablets (40 meq) by mouth with breakfast and 1 tablet (20 meq) by mouth with dinner     prednisoLONE acetate (PRED FORTE) 1 % ophthalmic suspension Place 1 drop into the left eye daily as needed (inflammation. (Max 2-3 days/week)).     Propylene Glycol 0.6 % SOLN Place 1 drop into both eyes 3 (three) times daily as needed.     pyridOXINE (VITAMIN B-6) 100 MG tablet Take 200 mg by mouth at bedtime.     quiNINE (QUALAQUIN) 324 MG capsule Take 324 mg by mouth at bedtime.      RHOPRESSA 0.02 % SOLN Place 1 drop into the right eye in the  morning.     rivaroxaban (XARELTO) 20 MG TABS tablet Take 20 mg by mouth at bedtime.      Sodium Chloride-Sodium Bicarb (NETI POT SINUS WASH NA) Place 1 application into the nose daily as needed (congestion).     TRELEGY ELLIPTA 200-62.5-25 MCG/INH AEPB INHALE 1 PUFF INTO THE LUNGS DAILY (Patient taking differently: Inhale 1 puff into the lungs every evening.) 60 each 1   valsartan-hydrochlorothiazide (DIOVAN-HCT) 80-12.5 MG per tablet Take 1 tablet by mouth in the morning. MID MORNING     Vitamin D, Ergocalciferol, (DRISDOL) 50000 UNITS CAPS capsule Take 50,000 Units by mouth  every Friday at 6 PM.     zinc sulfate 220 (50 Zn) MG capsule Take 220 mg by mouth in the morning. Mid morning     No current facility-administered medications on file prior to visit.    Allergies  Allergen Reactions   Hydrocodone-Acetaminophen Itching and Rash   Beta Adrenergic Blockers Other (See Comments)    RED EYES AND CONGESTION   Penicillins Hives, Itching and Swelling   Sulfa Antibiotics Hives    RED EYES/RED CHEST   Thimerosal Hives and Itching    Objective: Physical Exam  General: Well developed, nourished, no acute distress, awake, alert and oriented x 3  Vascular: Dorsalis pedis artery 1/4 bilateral, Posterior tibial artery 0/4 bilateral, due to chronic edema at ankles, skin temperature warm to warm proximal to distal bilateral lower extremities, no varicosities, minimal pedal hair present bilateral.  Neurological: Gross sensation present via light touch bilateral.  Protective sensation intact with Semmes Weinstein monofilament.  Vibratory intact bilateral.  Dermatological: Skin is warm, dry, and supple bilateral, Nails 1-10 are tender, short thick, and discolored with mild subungal debris with multiple nail centrally discolored, no webspace macerations present bilateral, no open lesions present bilateral, minimal reactive keratotic tissue noted at the fourth webspace on the right fifth toe medial  aspect, no signs of infection bilateral.  Musculoskeletal: Pes planus foot type.  No other symptomatic foot pain upon palpation.  Assessment and Plan:  Problem List Items Addressed This Visit   None Visit Diagnoses     Nail fungus    -  Primary   Relevant Orders   Fungus culture w smear   Toe pain, bilateral       Corn of toe       Lymphedema           -Examined patient -Discussed treatment options for painful dystrophic nails  -Clinical picture and Fungal culture was obtained by removing a portion of the hard nail itself from each of the involved toenails 1 through 10 using a sterile nail nipper and sent to [facility] lab. Patient tolerated the biopsy procedure well without discomfort or need for anesthesia.  -At no additional charge mechanically debrided keratotic lesion at the fourth webspace of the right foot using a sterile 15 blade without incident -Advised patient to continue with good supportive shoes daily for foot type that can avoid overcrowding at toes -Patient to return in 4 weeks for follow up evaluation and discussion of fungal culture results or sooner if symptoms worsen.  Landis Martins, DPM

## 2021-05-28 ENCOUNTER — Other Ambulatory Visit: Payer: Self-pay

## 2021-05-28 ENCOUNTER — Ambulatory Visit: Payer: BC Managed Care – PPO | Admitting: Allergy

## 2021-05-28 ENCOUNTER — Encounter: Payer: Self-pay | Admitting: Allergy

## 2021-05-28 VITALS — BP 128/78 | HR 72 | Temp 97.3°F | Resp 18

## 2021-05-28 DIAGNOSIS — H1013 Acute atopic conjunctivitis, bilateral: Secondary | ICD-10-CM | POA: Diagnosis not present

## 2021-05-28 DIAGNOSIS — J3089 Other allergic rhinitis: Secondary | ICD-10-CM

## 2021-05-28 DIAGNOSIS — K219 Gastro-esophageal reflux disease without esophagitis: Secondary | ICD-10-CM

## 2021-05-28 DIAGNOSIS — J4540 Moderate persistent asthma, uncomplicated: Secondary | ICD-10-CM

## 2021-05-28 NOTE — Progress Notes (Signed)
Follow-up Note  RE: [name] MRN: [id number] DOB: 11-14-1960 Date of Office Visit: 05/28/2021   History of present illness: [name] is a 61 y.o. female presenting today for follow-up of asthma, allergic rhinitis with conjunctivitis and reflux.  She was last seen in the office on 02/05/21 by myself.    Since her last visit I did communicate with her new ophthalmologist (her previous ophthalmologist left the practice) in regards to her eye condition and continuing on Dupixent injections.  The ophthalmologist did not see a reason she needed to discontinue Dupixent and thus I relayed this information to [name] that she could continue on.  She has thus resumed Dupixent injections self-administered every 2 weeks.  She states her asthma is not doing much better.  She is using Trelegy 200 mcg 1 puff daily and Singulair daily.  With the combination of this regimen she has not required use of her Xopenex inhaler.  She has not required any ED or urgent care visits or any systemic steroid needs. In regards to her allergies she states that they come and go in about 3 weeks ago she had more sneezing and cough like symptoms.  She took presides all which does continue to provide relief.  She states she has not really needed to use her nasal sprays.  She does states she has had 2 nosebleeds that was operatively quickly.  She feels like her nose is dry. She uses omeprazole as needed for reflux and states she has not needed to use it in a while. Since her last visit she did get hearing aids.  Review of systems In the past 4 weeks: Review of Systems  Constitutional: Negative.   HENT:  Positive for nosebleeds and sneezing.   Eyes: Negative.   Respiratory:  Positive for cough.   Cardiovascular: Negative.   Gastrointestinal: Negative.   Musculoskeletal: Negative.   Skin: Negative.   Allergic/Immunologic: Negative.   Neurological: Negative.     All other systems negative unless noted above  in HPI  Past medical/social/surgical/family history have been reviewed and are unchanged unless specifically indicated below.  No changes  Medication List: Current Outpatient Medications  Medication Sig Dispense Refill   acetaminophen (TYLENOL) 500 MG tablet Take 1,000 mg by mouth daily as needed (pain.).     Ascorbic Acid (VITAMIN C) 1000 MG tablet Take 1,000 mg by mouth daily as needed (immune support). Emergen C     azelastine (ASTELIN) 0.1 % nasal spray Place 2 sprays into both nostrils 2 (two) times daily as needed (nasal drainage). Use in each nostril as directed 30 mL 5   B Complex-Folic Acid (B COMPLEX FORMULA 1 PO) Take 1 drop by mouth in the morning.     BEE POLLEN PO Take 1 tablet by mouth in the morning and at bedtime. Bee Extract     bimatoprost (LUMIGAN) 0.01 % SOLN Place 1 drop into both eyes at bedtime.     BLACK ELDERBERRY PO Take 4.4 g by mouth in the morning.     brinzolamide (AZOPT) 1 % ophthalmic suspension Place 1 drop into both eyes 3 (three) times daily.     Calcium Carbonate-Vit D-Min (CALTRATE 600+D PLUS MINERALS PO) Take 2 tablets by mouth 3 (three) times daily as needed (bone health).     COMBIGAN 0.2-0.5 % ophthalmic solution Place 1 drop into both eyes 3 (three) times daily.     Cyanocobalamin (VITAMIN B-12) 2500 MCG SUBL Place 5,000 mcg under the  tongue at bedtime.     diltiazem (CARDIZEM CD) 240 MG 24 hr capsule Take 240 mg by mouth at bedtime.     Docosahexaenoic Acid (DHA PO) Take 2 tablets by mouth at bedtime. DHA Nordic Naturals     DUPIXENT 300 MG/2ML prefilled syringe INJECT 1 SYRINGE UNDER THE SKIN EVERY OTHER WEEK 4 mL 11   fexofenadine (ALLEGRA) 180 MG tablet Take 180 mg by mouth daily as needed for allergies.     furosemide (LASIX) 40 MG tablet Take 40 mg by mouth daily as needed (fluid retention).     levalbuterol (XOPENEX HFA) 45 MCG/ACT inhaler Inhale 2 puffs into the lungs every 4 (four) hours as needed. 15 g 1   levalbuterol (XOPENEX) 1.25  MG/3ML nebulizer solution Take 1.25 mg by nebulization every 6 (six) hours as needed for wheezing or shortness of breath.     levocetirizine (XYZAL) 5 MG tablet TAKE 1 TABLET(5 MG) BY MOUTH EVERY EVENING 90 tablet 0   loratadine-pseudoephedrine (ALAVERT D-12 HOUR ALLERGY/CONG) 5-120 MG tablet Take 1 tablet by mouth 2 (two) times daily as needed for allergies.     magnesium chloride (SLOW-MAG) 64 MG TBEC Take 1-2 tablets by mouth See admin instructions. Take 2 tablets by mouth every morning and 1 tablet by mouth with dinner     meloxicam (MOBIC) 7.5 MG tablet Take 7.5 mg by mouth daily as needed for pain.     mometasone (NASONEX) 50 MCG/ACT nasal spray SHAKE LIQUID AND USE 2 SPRAYS IN EACH NOSTRIL EVERY DAY (Patient taking differently: Place 1-2 sprays into the nose 2 (two) times daily as needed (allergies.).) 17 g 3   montelukast (SINGULAIR) 10 MG tablet Take 1 tablet (10 mg total) by mouth at bedtime. 30 tablet 5   Multiple Vitamin (MULTIVITAMIN WITH MINERALS) TABS tablet Take 2 tablets by mouth every evening.     Olopatadine HCl 0.2 % SOLN Apply 1 drop to eye daily as needed (Itchy, watery eyes). (Patient taking differently: Apply 1 drop to eye 2 (two) times daily as needed (Itchy, watery eyes).) 2.5 mL 3   omeprazole (PRILOSEC) 20 MG capsule Take 1 capsule (20 mg total) by mouth daily. 30 capsule 5   potassium chloride SA (K-DUR,KLOR-CON) 20 MEQ tablet Take 20-40 mEq by mouth See admin instructions. Take 2 tablets (40 meq) by mouth with breakfast and 1 tablet (20 meq) by mouth with dinner     prednisoLONE acetate (PRED FORTE) 1 % ophthalmic suspension Place 1 drop into the left eye daily as needed (inflammation. (Max 2-3 days/week)).     Propylene Glycol 0.6 % SOLN Place 1 drop into both eyes 3 (three) times daily as needed.     pyridOXINE (VITAMIN B-6) 100 MG tablet Take 200 mg by mouth at bedtime.     quiNINE (QUALAQUIN) 324 MG capsule Take 324 mg by mouth at bedtime.      RHOPRESSA 0.02 % SOLN  Place 1 drop into the right eye in the morning.     rivaroxaban (XARELTO) 20 MG TABS tablet Take 20 mg by mouth at bedtime.      Sodium Chloride-Sodium Bicarb (NETI POT SINUS WASH NA) Place 1 application into the nose daily as needed (congestion).     TRELEGY ELLIPTA 200-62.5-25 MCG/INH AEPB INHALE 1 PUFF INTO THE LUNGS DAILY (Patient taking differently: Inhale 1 puff into the lungs every evening.) 60 each 1   valsartan-hydrochlorothiazide (DIOVAN-HCT) 80-12.5 MG per tablet Take 1 tablet by mouth in the morning. MID MORNING  Vitamin D, Ergocalciferol, (DRISDOL) 50000 UNITS CAPS capsule Take 50,000 Units by mouth every Friday at 6 PM.     zinc sulfate 220 (50 Zn) MG capsule Take 220 mg by mouth in the morning. Mid morning     No current facility-administered medications for this visit.     Known medication allergies: Allergies  Allergen Reactions   Hydrocodone-Acetaminophen Itching and Rash   Beta Adrenergic Blockers Other (See Comments)    RED EYES AND CONGESTION   Penicillins Hives, Itching and Swelling   Sulfa Antibiotics Hives    RED EYES/RED CHEST   Thimerosal Hives and Itching     Physical examination: Blood pressure 128/78, pulse 72, temperature (!) 97.3 F (36.3 C), temperature source Temporal, resp. rate 18, last menstrual period 07/26/2011, SpO2 97 %.  General: Alert, interactive, in no acute distress. HEENT: PERRLA, TMs pearly gray, turbinates minimally edematous  visible dried blood on the nasal Interior left septum , post-pharynx non erythematous. Neck: Supple without lymphadenopathy. Lungs: Clear to auscultation without wheezing, rhonchi or rales. {no increased work of breathing. CV: Normal S1, S2 without murmurs. Abdomen: Nondistended, nontender. Skin: Warm and dry, without lesions or rashes. Extremities:  No clubbing, cyanosis or edema. Neuro:   Grossly intact.  Diagnositics/Labs: Spirometry: FEV1: 1.22 L 58%, FVC: 1.54 L 58%, ratio consistent with restrictive  pattern   Assessment and plan:   Asthma - continue Trelegy 222mcg 1 inhalation once a day.  Rinse mouth after use - continue Singulair 10mg  daily - have access to Xopenex inhaler 2 puffs every 4-6 hours as needed for cough/wheeze/shortness of breath/chest tightness.  May use 15-20 minutes prior to activity.   Monitor frequency of use.   - continue Dupixent injections every 2 weeks self-administered.  Ok'd to continue use with your ophthalmologist  Asthma control goals:  Full participation in all desired activities (may need albuterol before activity) Albuterol use two time or less a week on average (not counting use with activity) Cough interfering with sleep two time or less a month Oral steroids no more than once a year No hospitalizations  Allergic rhinitis or conjunctivitis  - continue your allergen avoidance measures  - continue use of Xyzal 5mg  daily in evenings  - you may take additional antihistamine (Allegra) dose in a day if needed for itch  - singulair as above helps also with allergy symptom control  - for itchy/watery eyes can continue use of as needed Pazeo or Pataday  - for nasal congestion/stuffiness use of Nasonex or Nasacort 1-2 sprays each nostril daily as needed  - for nasal drainage/post-nasal drip use Astelin 2 sprays each nostril 2 times a day for symptom control  - to help keep nose from getting to dry or causing nose bleeds recommend use of nasal saline spray 1-2 times a day.  Provided with sample of [place]yr nasal saline spray  Reflux  - reflux symptoms can be triggers of asthma symptoms  - you have had adverse symptoms with Famotidine thus will not recommend use  - continue use of Omeprazole as directed for reflux control  Follow-up 6 months or sooner if needed   I appreciate the opportunity to take part in [name]'s care. Please do not hesitate to contact me with questions.  Sincerely,   Prudy Feeler, MD Allergy/Immunology [facility] [place]

## 2021-05-28 NOTE — Patient Instructions (Addendum)
Asthma - continue Trelegy 249mcg 1 inhalation once a day.  Rinse mouth after use - continue Singulair 10mg  daily - have access to Xopenex inhaler 2 puffs every 4-6 hours as needed for cough/wheeze/shortness of breath/chest tightness.  May use 15-20 minutes prior to activity.   Monitor frequency of use.   - continue Dupixent injections every 2 weeks self-administered.  Ok'd to continue use with your ophthalmologist  Asthma control goals:  Full participation in all desired activities (may need albuterol before activity) Albuterol use two time or less a week on average (not counting use with activity) Cough interfering with sleep two time or less a month Oral steroids no more than once a year No hospitalizations  Allergies  - continue your allergen avoidance measures  - continue use of Xyzal 5mg  daily in evenings  - you may take additional antihistamine (Allegra) dose in a day if needed for itch  - singulair as above helps also with allergy symptom control  - for itchy/watery eyes can continue use of as needed Pazeo or Pataday  - for nasal congestion/stuffiness use of Nasonex or Nasacort 1-2 sprays each nostril daily as needed  - for nasal drainage/post-nasal drip use Astelin 2 sprays each nostril 2 times a day for symptom control  - to help keep nose from getting to dry or causing nose bleeds recommend use of nasal saline spray 1-2 times a day.  Provided with sample of Ayr nasal saline spray  Reflux  - reflux symptoms can be triggers of asthma symptoms  - you have had adverse symptoms with Famotidine thus will not recommend use  - continue use of Omeprazole as directed for reflux control  Follow-up 6 months or sooner if needed

## 2021-05-29 ENCOUNTER — Ambulatory Visit: Payer: BC Managed Care – PPO | Admitting: Allergy

## 2021-06-11 ENCOUNTER — Other Ambulatory Visit: Payer: Self-pay | Admitting: Allergy

## 2021-06-12 ENCOUNTER — Ambulatory Visit: Payer: BC Managed Care – PPO | Admitting: Sports Medicine

## 2021-06-18 ENCOUNTER — Telehealth: Payer: Self-pay | Admitting: Allergy

## 2021-06-18 ENCOUNTER — Other Ambulatory Visit: Payer: Self-pay | Admitting: Pulmonary Disease

## 2021-06-18 NOTE — Telephone Encounter (Signed)
Patient called office requesting for Dr Nelva Bush to take over her Rx for Trelegy 200-62.5-25 mcg. Patient stated that she usually get it from Dr Elsworth Soho her Pulmonary doctor. However, she does not want to go back and see him. ? ?After checking the Rx, I have inform patient that Dr Elsworth Soho just sent Rx for Trelegy today 06/18/2021 so patient will pick up that Rx. She will discuss with Dr Nelva Bush on her next appointment.  ?

## 2021-06-19 ENCOUNTER — Other Ambulatory Visit: Payer: Self-pay | Admitting: Pulmonary Disease

## 2021-06-19 NOTE — Telephone Encounter (Signed)
completed

## 2021-07-03 ENCOUNTER — Encounter: Payer: Self-pay | Admitting: Sports Medicine

## 2021-07-03 ENCOUNTER — Ambulatory Visit: Payer: BC Managed Care – PPO | Admitting: Sports Medicine

## 2021-07-03 ENCOUNTER — Other Ambulatory Visit: Payer: Self-pay

## 2021-07-03 DIAGNOSIS — B351 Tinea unguium: Secondary | ICD-10-CM | POA: Diagnosis not present

## 2021-07-03 DIAGNOSIS — Q828 Other specified congenital malformations of skin: Secondary | ICD-10-CM | POA: Diagnosis not present

## 2021-07-03 DIAGNOSIS — M79671 Pain in right foot: Secondary | ICD-10-CM | POA: Diagnosis not present

## 2021-07-03 DIAGNOSIS — M79674 Pain in right toe(s): Secondary | ICD-10-CM | POA: Diagnosis not present

## 2021-07-03 NOTE — Progress Notes (Signed)
Subjective: ?[name] is a 61 y.o. female patient seen today in office for fungal culture results.  States that sometimes she gets a hard area of skin to the bottom of the right foot has an old history of stepping on earring many years ago states that it is not painful but sometimes he notices that there is a spot there that she can pick away.  Patient has no other pedal complaints at this time.  ? ?Patient Active Problem List  ? Diagnosis Date Noted  ? GERD (gastroesophageal reflux disease) 10/03/2019  ? Central retinal vein occlusion with neovascularization of left eye 09/04/2019  ? Primary open angle glaucoma of both eyes, severe stage 09/04/2019  ? Nondiabetic proliferative retinopathy, left 09/04/2019  ? Shortness of breath on exertion 11/04/2017  ? Type 2 diabetes mellitus without complication, without long-term current use of insulin ([place]) 11/04/2017  ? Essential hypertension 11/04/2017  ? Other headache syndrome 08/30/2014  ? Speech disturbance 08/30/2014  ? Vertigo 08/30/2014  ? Right knee DJD 03/22/2014  ? 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 Anemia 09/10/2011  ? ARF (acute renal failure) ([place]) 09/10/2011  ? Paresthesias 09/10/2011  ? Total knee replacement status, left 09/10/2011  ? Cervical spondylosis with myelopathy 06/30/2011  ? Osteoarthritis of both knees 06/30/2011  ? Diabetes mellitus   ? Arthritis   ? Sleep apnea, obstructive   ? Morbid obesity ([place])   ? Dysfunctional uterine bleeding   ? HEPATITIS C 01/25/2008  ? HYPERTENSION 01/25/2008  ? C V A / STROKE 01/25/2008  ? Moderate persistent asthma 01/25/2008  ? Asthma, persistent not controlled 01/25/2008  ? ? ?Current Outpatient Medications on File Prior to Visit  ?Medication Sig Dispense Refill  ? acetaminophen (TYLENOL) 500 MG tablet Take 1,000 mg by mouth daily as needed (pain.).    ? Ascorbic Acid (VITAMIN C) 1000 MG tablet Take 1,000 mg by mouth daily as needed (immune support). Emergen C    ? azelastine  (ASTELIN) 0.1 % nasal spray USE 2 SPRAYS IN EACH NOSTRIL TWICE DAILY AS NEEDED(NASAL DRAINAGE) AS DIRECTED 30 mL 5  ? B Complex-Folic Acid (B COMPLEX FORMULA 1 PO) Take 1 drop by mouth in the morning.    ? BEE POLLEN PO Take 1 tablet by mouth in the morning and at bedtime. Bee Extract    ? bimatoprost (LUMIGAN) 0.01 % SOLN Place 1 drop into both eyes at bedtime.    ? BLACK ELDERBERRY PO Take 4.4 g by mouth in the morning.    ? brinzolamide (AZOPT) 1 % ophthalmic suspension Place 1 drop into both eyes 3 (three) times daily.    ? Calcium Carbonate-Vit D-Min (CALTRATE 600+D PLUS MINERALS PO) Take 2 tablets by mouth 3 (three) times daily as needed (bone health).    ? COMBIGAN 0.2-0.5 % ophthalmic solution Place 1 drop into both eyes 3 (three) times daily.    ? Cyanocobalamin (VITAMIN B-12) 2500 MCG SUBL Place 5,000 mcg under the tongue at bedtime.    ? diltiazem (CARDIZEM CD) 240 MG 24 hr capsule Take 240 mg by mouth at bedtime.    ? Docosahexaenoic Acid (DHA PO) Take 2 tablets by mouth at bedtime. [place] [employer] Naturals    ? DUPIXENT 300 MG/2ML prefilled syringe INJECT 1 SYRINGE UNDER THE SKIN EVERY OTHER WEEK 4 mL 11  ? fexofenadine (ALLEGRA) 180 MG tablet Take 180 mg by mouth daily as needed for allergies.    ? furosemide (LASIX) 40  MG tablet Take 40 mg by mouth daily as needed (fluid retention).    ? levalbuterol (XOPENEX HFA) 45 MCG/ACT inhaler Inhale 2 puffs into the lungs every 4 (four) hours as needed. 15 g 1  ? levalbuterol (XOPENEX) 1.25 MG/3ML nebulizer solution Take 1.25 mg by nebulization every 6 (six) hours as needed for wheezing or shortness of breath.    ? levocetirizine (XYZAL) 5 MG tablet TAKE 1 TABLET(5 MG) BY MOUTH EVERY EVENING 90 tablet 0  ? loratadine-pseudoephedrine (ALAVERT D-12 HOUR ALLERGY/CONG) 5-120 MG tablet Take 1 tablet by mouth 2 (two) times daily as needed for allergies.    ? magnesium chloride (SLOW-MAG) 64 MG TBEC Take 1-2 tablets by mouth See admin instructions. Take 2 tablets by mouth  every morning and 1 tablet by mouth with dinner    ? meloxicam (MOBIC) 7.5 MG tablet Take 7.5 mg by mouth daily as needed for pain.    ? mometasone (NASONEX) 50 MCG/ACT nasal spray SHAKE LIQUID AND USE 2 SPRAYS IN EACH NOSTRIL EVERY DAY (Patient taking differently: Place 1-2 sprays into the nose 2 (two) times daily as needed (allergies.).) 17 g 3  ? montelukast (SINGULAIR) 10 MG tablet Take 1 tablet (10 mg total) by mouth at bedtime. 30 tablet 5  ? Multiple Vitamin (MULTIVITAMIN WITH MINERALS) TABS tablet Take 2 tablets by mouth every evening.    ? Olopatadine HCl 0.2 % SOLN Apply 1 drop to eye daily as needed (Itchy, watery eyes). (Patient taking differently: Apply 1 drop to eye 2 (two) times daily as needed (Itchy, watery eyes).) 2.5 mL 3  ? omeprazole (PRILOSEC) 20 MG capsule Take 1 capsule (20 mg total) by mouth daily. 30 capsule 5  ? potassium chloride SA (K-DUR,KLOR-CON) 20 MEQ tablet Take 20-40 mEq by mouth See admin instructions. Take 2 tablets (40 meq) by mouth with breakfast and 1 tablet (20 meq) by mouth with dinner    ? prednisoLONE acetate (PRED FORTE) 1 % ophthalmic suspension Place 1 drop into the left eye daily as needed (inflammation. (Max 2-3 days/week)).    ? Propylene Glycol 0.6 % SOLN Place 1 drop into both eyes 3 (three) times daily as needed.    ? pyridOXINE (VITAMIN B-6) 100 MG tablet Take 200 mg by mouth at bedtime.    ? quiNINE (QUALAQUIN) 324 MG capsule Take 324 mg by mouth at bedtime.     ? RHOPRESSA 0.02 % SOLN Place 1 drop into the right eye in the morning.    ? rivaroxaban (XARELTO) 20 MG TABS tablet Take 20 mg by mouth at bedtime.     ? Sodium Chloride-Sodium Bicarb (NETI POT SINUS WASH NA) Place 1 application into the nose daily as needed (congestion).    ? TRELEGY ELLIPTA 200-62.5-25 MCG/ACT AEPB INHALE 1 PUFF INTO THE LUNGS DAILY 60 each 1  ? valsartan-hydrochlorothiazide (DIOVAN-HCT) 80-12.5 MG per tablet Take 1 tablet by mouth in the morning. MID MORNING    ? Vitamin D,  Ergocalciferol, (DRISDOL) 50000 UNITS CAPS capsule Take 50,000 Units by mouth every Friday at 6 PM.    ? zinc sulfate 220 (50 Zn) MG capsule Take 220 mg by mouth in the morning. Mid morning    ? ?No current facility-administered medications on file prior to visit.  ? ? ?Allergies  ?Allergen Reactions  ? Hydrocodone-Acetaminophen Itching and Rash  ? Beta Adrenergic Blockers Other (See Comments)  ?  RED EYES AND CONGESTION  ? Penicillins Hives, Itching and Swelling  ? Sulfa Antibiotics Hives  ?  RED EYES/RED CHEST  ? Thimerosal Hives and Itching  ? ? ?Objective: ?Physical Exam ? ?General: Well developed, nourished, no acute distress, awake, alert and oriented x 3 ? ?Vascular: Dorsalis pedis artery 1/4 bilateral, Posterior tibial artery 0/4 bilateral, skin temperature warm to warm proximal to distal bilateral lower extremities, chronic skin changes consistent with lymphedema. ? ?Neurological: Gross sensation present via light touch bilateral.  ? ?Dermatological: Skin is warm, dry, and supple bilateral, Nails 1-10 are tender, short thick, and discolored with mild subungal debris and pigmentation noted, no webspace macerations present bilateral, no open lesions present bilateral, on the plantar right lateral midfoot there is hyperkeratotic tissue present with a nucleated core consistent with porokeratosis. No signs of infection bilateral. ? ?Musculoskeletal: Asymptomatic hammertoe and pes planus boney deformities noted bilateral. Muscular strength within normal limits without painon range of motion. No pain with calf compression bilateral. ? ?Fungal culture-negative consistent of changes of microtrauma ? ?Assessment and Plan:  ?Problem List Items Addressed This Visit   ?None ?Visit Diagnoses   ? ? Nail fungus    -  Primary  ? Toe pain, bilateral      ? Porokeratosis      ? Right foot pain      ? Lymphedema      ? Pes planus of both feet      ? ?  ? ? ?-Examined patient ?-Discussed treatment options for mycotic nails with  changes consistent with microtrauma ?-Rx topical from [facility] of urea ?-Advised patient to supportive shoes and to avoid curling her toes to prevent worsening of nails ?-At no additional Biochemist, clinical

## 2021-08-12 ENCOUNTER — Other Ambulatory Visit: Payer: Self-pay | Admitting: Pulmonary Disease

## 2021-09-08 ENCOUNTER — Encounter (INDEPENDENT_AMBULATORY_CARE_PROVIDER_SITE_OTHER): Payer: Self-pay

## 2021-11-27 ENCOUNTER — Ambulatory Visit: Payer: BC Managed Care – PPO | Admitting: Allergy

## 2021-12-31 ENCOUNTER — Ambulatory Visit: Payer: BC Managed Care – PPO | Admitting: Allergy

## 2022-01-01 ENCOUNTER — Ambulatory Visit: Payer: BC Managed Care – PPO | Admitting: Allergy

## 2022-01-01 ENCOUNTER — Encounter: Payer: Self-pay | Admitting: Allergy

## 2022-01-01 VITALS — BP 130/78 | HR 62 | Temp 97.5°F | Resp 12 | Wt 312.6 lb

## 2022-01-01 DIAGNOSIS — J4540 Moderate persistent asthma, uncomplicated: Secondary | ICD-10-CM | POA: Diagnosis not present

## 2022-01-01 DIAGNOSIS — K219 Gastro-esophageal reflux disease without esophagitis: Secondary | ICD-10-CM | POA: Diagnosis not present

## 2022-01-01 DIAGNOSIS — J3089 Other allergic rhinitis: Secondary | ICD-10-CM | POA: Diagnosis not present

## 2022-01-01 DIAGNOSIS — H1013 Acute atopic conjunctivitis, bilateral: Secondary | ICD-10-CM | POA: Diagnosis not present

## 2022-01-01 MED ORDER — LEVALBUTEROL TARTRATE 45 MCG/ACT IN AERO: 2.0000 | INHALATION_SPRAY | RESPIRATORY_TRACT | 1 refills | Status: DC | PRN

## 2022-01-01 MED ORDER — TRELEGY ELLIPTA 200-62.5-25 MCG/ACT IN AEPB: 1.0000 | INHALATION_SPRAY | Freq: Every day | RESPIRATORY_TRACT | 5 refills | Status: DC

## 2022-01-01 MED ORDER — MONTELUKAST SODIUM 10 MG PO TABS: 10.0000 mg | ORAL_TABLET | Freq: Every day | ORAL | 5 refills | Status: DC

## 2022-01-01 MED ORDER — OMEPRAZOLE 20 MG PO CPDR
20.0000 mg | DELAYED_RELEASE_CAPSULE | Freq: Every day | ORAL | 5 refills | Status: DC
Start: 2022-01-01 — End: 2022-07-30

## 2022-01-01 NOTE — Patient Instructions (Addendum)
Asthma - continue Trelegy 258mg 1 inhalation once a day.  Rinse mouth after use - continue Singulair 163mdaily - have access to Xopenex inhaler 2 puffs every 4-6 hours as needed for cough/wheeze/shortness of breath/chest tightness.  May use 15-20 minutes prior to activity.   Monitor frequency of use.   - continue Dupixent injections every 2 weeks self-administered. Alternate your injection sites.   Asthma control goals:  Full participation in all desired activities (may need albuterol before activity) Albuterol use two time or less a week on average (not counting use with activity) Cough interfering with sleep two time or less a month Oral steroids no more than once a year No hospitalizations  Allergies  - continue your allergen avoidance measures  - continue use of Claritin at this time.  Can rotate your antihistamine every 3-6 months  - singulair as above helps also with allergy symptom control  - for itchy/watery eyes can continue use of as needed Pazeo or Pataday  - for nasal congestion/stuffiness use of Nasonex or Nasacort 1-2 sprays each nostril daily as needed  - for nasal drainage/post-nasal drip use Astelin 2 sprays each nostril 2 times a day for symptom control  - recommend starting back nasal saline rinse twice a day at this time.  Rinse kit provide.  Use saline rinse kit prior to nasal spray use.  Use distilled water or boil water and bring to room temperature (do not use tap water).   Reflux  - reflux symptoms can be triggers of asthma symptoms  - use Omeprazole as needed for reflux control  Follow-up 6 months or sooner if needed

## 2022-01-01 NOTE — Progress Notes (Signed)
Follow-up Note  RE: [name] MRN: [id number] DOB: Sep 16, 1960 Date of Office Visit: 01/01/2022   History of present illness: [name] is a 61 y.o. female presenting today for follow-up of asthma, allergic rhinitis with conjunctivitis and reflux.  She was last seen in the office on 05/28/2021 by myself.  Over the past 2 weeks she feels the ragweed is affecting her more.  She states she can have a sore throat when waking up. She took a Covid test that was negative. Instead of Xyzal she has been taking Claritin for right now.  She is using the clariting gel cap and it does seem to be working better than the regular claritin tab.  She will however take benadryl at night.  She uses the nasal steroid spray as needed.  She does have the Astelin spray that she will use for drainage control.  She also will use her allergy based eyedrop if she has any itchy or watery eyes. Her breathing has not been very affected by the ragweed.  She feels like her breathing is doing quite well at this time.  She is using Trelegy 1 puff once a day.  She also takes Singulair daily.  She continues the Dupixent injections that she self administers every 2 weeks at home.  She rotate between abdomen and thigh.  She has not needed to use her Xopenex that much.  She has not had any need for ED or urgent care visits or any systemic steroids. She states she has not had much issues with reflux lately.  She tries not to use the omeprazole as she states when she used it before she had bowel incontinence.  She states she will take the omeprazole if she has eaten the food that she knows will cause her reflux to flare.   Review of systems: Review of Systems  Constitutional: Negative.   HENT:  Positive for postnasal drip and sore throat.   Eyes: Negative.   Respiratory: Negative.    Cardiovascular: Negative.   Gastrointestinal: Negative.   Musculoskeletal: Negative.   Skin: Negative.   Allergic/Immunologic: Negative.    Neurological: Negative.      All other systems negative unless noted above in HPI  Past medical/social/surgical/family history have been reviewed and are unchanged unless specifically indicated below.  No changes  Medication List: Current Outpatient Medications  Medication Sig Dispense Refill   acetaminophen (TYLENOL) 500 MG tablet Take 1,000 mg by mouth daily as needed (pain.).     Ascorbic Acid (VITAMIN C) 1000 MG tablet Take 1,000 mg by mouth daily as needed (immune support). Emergen C     azelastine (ASTELIN) 0.1 % nasal spray USE 2 SPRAYS IN EACH NOSTRIL TWICE DAILY AS NEEDED(NASAL DRAINAGE) AS DIRECTED 30 mL 5   B Complex-Folic Acid (B COMPLEX FORMULA 1 PO) Take 1 drop by mouth in the morning.     BEE POLLEN PO Take 1 tablet by mouth in the morning and at bedtime. Bee Extract     bimatoprost (LUMIGAN) 0.01 % SOLN Place 1 drop into both eyes at bedtime.     BLACK ELDERBERRY PO Take 4.4 g by mouth in the morning.     brinzolamide (AZOPT) 1 % ophthalmic suspension Place 1 drop into both eyes 3 (three) times daily.     Calcium Carbonate-Vit D-Min (CALTRATE 600+D PLUS MINERALS PO) Take 2 tablets by mouth 3 (three) times daily as needed (bone health).     COMBIGAN 0.2-0.5 % ophthalmic solution  Place 1 drop into both eyes 3 (three) times daily.     Cyanocobalamin (VITAMIN B-12) 2500 MCG SUBL Place 5,000 mcg under the tongue at bedtime.     diltiazem (CARDIZEM CD) 240 MG 24 hr capsule Take 240 mg by mouth at bedtime.     Docosahexaenoic Acid (DHA PO) Take 2 tablets by mouth at bedtime. DHA [employer]     DUPIXENT 300 MG/2ML prefilled syringe INJECT 1 SYRINGE UNDER THE SKIN EVERY OTHER WEEK 4 mL 11   fexofenadine (ALLEGRA) 180 MG tablet Take 180 mg by mouth daily as needed for allergies.     furosemide (LASIX) 40 MG tablet Take 40 mg by mouth daily as needed (fluid retention).     levalbuterol (XOPENEX) 1.25 MG/3ML nebulizer solution Take 1.25 mg by nebulization every 6 (six) hours as  needed for wheezing or shortness of breath.     levocetirizine (XYZAL) 5 MG tablet TAKE 1 TABLET(5 MG) BY MOUTH EVERY EVENING 90 tablet 0   loratadine-pseudoephedrine (ALAVERT D-12 HOUR ALLERGY/CONG) 5-120 MG tablet Take 1 tablet by mouth 2 (two) times daily as needed for allergies.     magnesium chloride (SLOW-MAG) 64 MG TBEC Take 1-2 tablets by mouth See admin instructions. Take 2 tablets by mouth every morning and 1 tablet by mouth with dinner     meloxicam (MOBIC) 7.5 MG tablet Take 7.5 mg by mouth daily as needed for pain.     mometasone (NASONEX) 50 MCG/ACT nasal spray SHAKE LIQUID AND USE 2 SPRAYS IN EACH NOSTRIL EVERY DAY (Patient taking differently: Place 1-2 sprays into the nose 2 (two) times daily as needed (allergies.).) 17 g 3   Multiple Vitamin (MULTIVITAMIN WITH MINERALS) TABS tablet Take 2 tablets by mouth every evening.     Olopatadine HCl 0.2 % SOLN Apply 1 drop to eye daily as needed (Itchy, watery eyes). (Patient taking differently: Apply 1 drop to eye 2 (two) times daily as needed (Itchy, watery eyes).) 2.5 mL 3   potassium chloride SA (K-DUR,KLOR-CON) 20 MEQ tablet Take 20-40 mEq by mouth See admin instructions. Take 2 tablets (40 meq) by mouth with breakfast and 1 tablet (20 meq) by mouth with dinner     prednisoLONE acetate (PRED FORTE) 1 % ophthalmic suspension Place 1 drop into the left eye daily as needed (inflammation. (Max 2-3 days/week)).     Propylene Glycol 0.6 % SOLN Place 1 drop into both eyes 3 (three) times daily as needed.     pyridOXINE (VITAMIN B-6) 100 MG tablet Take 200 mg by mouth at bedtime.     quiNINE (QUALAQUIN) 324 MG capsule Take 324 mg by mouth at bedtime.      RHOPRESSA 0.02 % SOLN Place 1 drop into the right eye in the morning.     rivaroxaban (XARELTO) 20 MG TABS tablet Take 20 mg by mouth at bedtime.      Sodium Chloride-Sodium Bicarb (NETI POT SINUS WASH NA) Place 1 application into the nose daily as needed (congestion).      valsartan-hydrochlorothiazide (DIOVAN-HCT) 80-12.5 MG per tablet Take 1 tablet by mouth in the morning. MID MORNING     Vitamin D, Ergocalciferol, (DRISDOL) 50000 UNITS CAPS capsule Take 50,000 Units by mouth every Friday at 6 PM.     zinc sulfate 220 (50 Zn) MG capsule Take 220 mg by mouth in the morning. Mid morning     Fluticasone-Umeclidin-Vilant (TRELEGY ELLIPTA) 200-62.5-25 MCG/ACT AEPB Inhale 1 puff into the lungs daily. 60 each 5  levalbuterol (XOPENEX HFA) 45 MCG/ACT inhaler Inhale 2 puffs into the lungs every 4 (four) hours as needed. 15 g 1   montelukast (SINGULAIR) 10 MG tablet Take 1 tablet (10 mg total) by mouth at bedtime. 30 tablet 5   omeprazole (PRILOSEC) 20 MG capsule Take 1 capsule (20 mg total) by mouth daily. 30 capsule 5   No current facility-administered medications for this visit.     Known medication allergies: Allergies  Allergen Reactions   Hydrocodone-Acetaminophen Itching and Rash   Beta Adrenergic Blockers Other (See Comments)    RED EYES AND CONGESTION   Penicillins Hives, Itching and Swelling   Sulfa Antibiotics Hives    RED EYES/RED CHEST   Thimerosal Hives and Itching     Physical examination: Blood pressure 130/78, pulse 62, temperature (!) 97.5 F (36.4 C), temperature source Temporal, resp. rate 12, weight (!) 312 lb 9.6 oz (141.8 kg), last menstrual period 06/02/2011, SpO2 98 %.  General: Alert, interactive, in no acute distress. HEENT: PERRLA, TMs pearly gray, turbinates non-edematous without discharge, post-pharynx non erythematous. Neck: Supple without lymphadenopathy. Lungs: Clear to auscultation without wheezing, rhonchi or rales. {no increased work of breathing. CV: Normal S1, S2 without murmurs. Abdomen: Nondistended, nontender. Skin: Warm and dry, without lesions or rashes. Extremities:  No clubbing, cyanosis or edema. Neuro:   Grossly intact.  Diagnositics/Labs:  Spirometry: FEV1: 1.6L 77%, FVC: 1.97L 75%, ratio consistent with  nonobstructive pattern for demographic/age  Assessment and plan: Asthma - continue Trelegy 271mg 1 inhalation once a day.  Rinse mouth after use - continue Singulair 159mdaily - have access to Xopenex inhaler 2 puffs every 4-6 hours as needed for cough/wheeze/shortness of breath/chest tightness.  May use 15-20 minutes prior to activity.   Monitor frequency of use.   - continue Dupixent injections every 2 weeks self-administered. Alternate your injection sites.   Asthma control goals:  Full participation in all desired activities (may need albuterol before activity) Albuterol use two time or less a week on average (not counting use with activity) Cough interfering with sleep two time or less a month Oral steroids no more than once a year No hospitalizations  Allergies  - continue your allergen avoidance measures  - continue use of Claritin at this time.  Can rotate your antihistamine every 3-6 months  - singulair as above helps also with allergy symptom control  - for itchy/watery eyes can continue use of as needed [place]o or Pataday  - for nasal congestion/stuffiness use of Nasonex or Nasacort 1-2 sprays each nostril daily as needed  - for nasal drainage/post-nasal drip use Astelin 2 sprays each nostril 2 times a day for symptom control  - recommend starting back nasal saline rinse twice a day at this time.  Rinse kit provide.  Use saline rinse kit prior to nasal spray use.  Use distilled water or boil water and bring to room temperature (do not use tap water).   Reflux  - reflux symptoms can be triggers of asthma symptoms  - use Omeprazole as needed for reflux control  Follow-up 6 months or sooner if needed  I appreciate the opportunity to take part in Twanda's care. Please do not hesitate to contact me with questions.  Sincerely,   ShPrudy FeelerMD [facility] [place]

## 2022-01-08 ENCOUNTER — Ambulatory Visit (INDEPENDENT_AMBULATORY_CARE_PROVIDER_SITE_OTHER): Payer: BC Managed Care – PPO | Admitting: Ophthalmology

## 2022-01-08 ENCOUNTER — Encounter (INDEPENDENT_AMBULATORY_CARE_PROVIDER_SITE_OTHER): Payer: Self-pay | Admitting: Ophthalmology

## 2022-01-08 DIAGNOSIS — H3522 Other non-diabetic proliferative retinopathy, left eye: Secondary | ICD-10-CM

## 2022-01-08 DIAGNOSIS — E119 Type 2 diabetes mellitus without complications: Secondary | ICD-10-CM | POA: Diagnosis not present

## 2022-01-08 DIAGNOSIS — H401133 Primary open-angle glaucoma, bilateral, severe stage: Secondary | ICD-10-CM

## 2022-01-08 DIAGNOSIS — H348121 Central retinal vein occlusion, left eye, with retinal neovascularization: Secondary | ICD-10-CM | POA: Diagnosis not present

## 2022-01-08 NOTE — Assessment & Plan Note (Signed)
No detectable diabetic retinopathy 

## 2022-01-08 NOTE — Assessment & Plan Note (Signed)
Stable OS not active.

## 2022-01-08 NOTE — Progress Notes (Signed)
01/08/2022     CHIEF COMPLAINT Patient presents for  Chief Complaint  Patient presents with   Central Retinal Vein Occlusion    History of neovascularization OS.  No recurrences now for years.  HISTORY OF PRESENT ILLNESS: [name] is a 61 y.o. female who presents to the clinic today for:   HPI   1 YR FU OU OCT FP. Pt stated vision stated has remained stable since last visit. Pt denies floaters and FOL. Pt stated no longer taking lumigan.  Last edited by Silvestre Moment on 01/08/2022 10:55 AM.      Referring physician: Lisabeth Pick, MD [address] [place],  [place] [address]  HISTORICAL INFORMATION:   Selected notes from the MEDICAL RECORD NUMBER    Lab Results  Component Value Date   HGBA1C 5.5 11/04/2017     CURRENT MEDICATIONS: Current Outpatient Medications (Ophthalmic Drugs)  Medication Sig   Latanoprostene Bunod 0.024 % SOLN Place 1 drop into both eyes daily.   bimatoprost (LUMIGAN) 0.01 % SOLN Place 1 drop into both eyes at bedtime.   brinzolamide (AZOPT) 1 % ophthalmic suspension Place 1 drop into both eyes 3 (three) times daily.   COMBIGAN 0.2-0.5 % ophthalmic solution Place 1 drop into both eyes 3 (three) times daily.   Olopatadine HCl 0.2 % SOLN Apply 1 drop to eye daily as needed (Itchy, watery eyes). (Patient taking differently: Apply 1 drop to eye 2 (two) times daily as needed (Itchy, watery eyes).)   prednisoLONE acetate (PRED FORTE) 1 % ophthalmic suspension Place 1 drop into the left eye daily as needed (inflammation. (Max 2-3 days/week)).   Propylene Glycol 0.6 % SOLN Place 1 drop into both eyes 3 (three) times daily as needed.   RHOPRESSA 0.02 % SOLN Place 1 drop into the right eye in the morning.   No current facility-administered medications for this visit. (Ophthalmic Drugs)   Current Outpatient Medications (Other)  Medication Sig   acetaminophen (TYLENOL) 500 MG tablet Take 1,000 mg by mouth daily as needed (pain.).   Ascorbic Acid  (VITAMIN C) 1000 MG tablet Take 1,000 mg by mouth daily as needed (immune support). Emergen C   azelastine (ASTELIN) 0.1 % nasal spray USE 2 SPRAYS IN EACH NOSTRIL TWICE DAILY AS NEEDED(NASAL DRAINAGE) AS DIRECTED   B Complex-Folic Acid (B COMPLEX FORMULA 1 PO) Take 1 drop by mouth in the morning.   BEE POLLEN PO Take 1 tablet by mouth in the morning and at bedtime. Bee Extract   BLACK ELDERBERRY PO Take 4.4 g by mouth in the morning.   Calcium Carbonate-Vit D-Min (CALTRATE 600+D PLUS MINERALS PO) Take 2 tablets by mouth 3 (three) times daily as needed (bone health).   Cyanocobalamin (VITAMIN B-12) 2500 MCG SUBL Place 5,000 mcg under the tongue at bedtime.   diltiazem (CARDIZEM CD) 240 MG 24 hr capsule Take 240 mg by mouth at bedtime.   Docosahexaenoic Acid (DHA PO) Take 2 tablets by mouth at bedtime. DHA [employer]atur[employer]   DUPIXENT 300 MG/2ML prefilled syringe INJECT 1 SYRINGE UNDER THE SKIN EVERY OTHER WEEK   fexofenadine (ALLEGRA) 180 MG tablet Take 180 mg by mouth daily as needed for allergies.   Fluticasone-Umeclidin-Vilant (TRELEGY ELLIPTA) 200-62.5-25 MCG/ACT AEPB Inhale 1 puff into the lungs daily.   furosemide (LASIX) 40 MG tablet Take 40 mg by mouth daily as needed (fluid retention).   levalbuterol (XOPENEX HFA) 45 MCG/ACT inhaler Inhale 2 puffs into the lungs every 4 (four) hours as  needed.   levalbuterol (XOPENEX) 1.25 MG/3ML nebulizer solution Take 1.25 mg by nebulization every 6 (six) hours as needed for wheezing or shortness of breath.   levocetirizine (XYZAL) 5 MG tablet TAKE 1 TABLET(5 MG) BY MOUTH EVERY EVENING   loratadine-pseudoephedrine (ALAVERT D-12 HOUR ALLERGY/CONG) 5-120 MG tablet Take 1 tablet by mouth 2 (two) times daily as needed for allergies.   magnesium chloride (SLOW-MAG) 64 MG TBEC Take 1-2 tablets by mouth See admin instructions. Take 2 tablets by mouth every morning and 1 tablet by mouth with dinner   meloxicam (MOBIC) 7.5 MG tablet Take 7.5 mg by mouth daily as  needed for pain.   mometasone (NASONEX) 50 MCG/ACT nasal spray SHAKE LIQUID AND USE 2 SPRAYS IN EACH NOSTRIL EVERY DAY (Patient taking differently: Place 1-2 sprays into the nose 2 (two) times daily as needed (allergies.).)   montelukast (SINGULAIR) 10 MG tablet Take 1 tablet (10 mg total) by mouth at bedtime.   Multiple Vitamin (MULTIVITAMIN WITH MINERALS) TABS tablet Take 2 tablets by mouth every evening.   omeprazole (PRILOSEC) 20 MG capsule Take 1 capsule (20 mg total) by mouth daily.   potassium chloride SA (K-DUR,KLOR-CON) 20 MEQ tablet Take 20-40 mEq by mouth See admin instructions. Take 2 tablets (40 meq) by mouth with breakfast and 1 tablet (20 meq) by mouth with dinner   pyridOXINE (VITAMIN B-6) 100 MG tablet Take 200 mg by mouth at bedtime.   quiNINE (QUALAQUIN) 324 MG capsule Take 324 mg by mouth at bedtime.    rivaroxaban (XARELTO) 20 MG TABS tablet Take 20 mg by mouth at bedtime.    Sodium Chloride-Sodium Bicarb (NETI POT SINUS WASH NA) Place 1 application into the nose daily as needed (congestion).   valsartan-hydrochlorothiazide (DIOVAN-HCT) 80-12.5 MG per tablet Take 1 tablet by mouth in the morning. MID MORNING   Vitamin D, Ergocalciferol, (DRISDOL) 50000 UNITS CAPS capsule Take 50,000 Units by mouth every Friday at 6 PM.   zinc sulfate 220 (50 Zn) MG capsule Take 220 mg by mouth in the morning. Mid morning   No current facility-administered medications for this visit. (Other)      REVIEW OF SYSTEMS: ROS   Negative for: Constitutional, Gastrointestinal, Neurological, Skin, Genitourinary, Musculoskeletal, HENT, Endocrine, Cardiovascular, Eyes, Respiratory, Psychiatric, Allergic/Imm, Heme/Lymph Last edited by Silvestre Moment on 01/08/2022 10:46 AM.       ALLERGIES Allergies  Allergen Reactions   Hydrocodone-Acetaminophen Itching and Rash   Beta Adrenergic Blockers Other (See Comments)    RED EYES AND CONGESTION   Penicillins Hives, Itching and Swelling   Sulfa Antibiotics  Hives    RED EYES/RED CHEST   Thimerosal (Thiomersal) Hives and Itching    PAST MEDICAL HISTORY Past Medical History:  Diagnosis Date   Anemia    Anxiety    Arthritis    knee    Asthma    Back pain    Depression    Diabetes (HCC)    DVT (deep venous thrombosis) (HCC)    Dysfunctional uterine bleeding    Dyspnea    GERD (gastroesophageal reflux disease)    Glaucoma    Headache(784.0)    MIGRAINES IN PAST   Hypertension    Hypothyroidism    Joint pain    Lactose intolerance    Leg edema    Morbid obesity ([place])    Multiple food allergies    Optic neuropathy    Osteoarthritis    PE (pulmonary embolism)    Sleep apnea, obstructive  USES C-PAP   Vitreous hemorrhage of left eye ([place]) 11/13/2019   Past Surgical History:  Procedure Laterality Date   CAPSULOTOMY  05/03/2012   Procedure: MINOR CAPSULOTOMY;  Surgeon: Myrtha Mantis., MD;  Location: [facility] OR;  Service: Ophthalmology;  Laterality: Right;   CAPSULOTOMY  05/05/2012   Procedure: MINOR CAPSULOTOMY;  Surgeon: Myrtha Mantis., MD;  Location: [place];  Service: Ophthalmology;  Laterality: Right;   CATARACT SURG     COLONOSCOPY WITH PROPOFOL N/A 12/06/2020   Procedure: COLONOSCOPY WITH PROPOFOL;  Surgeon: Carol Ada, MD;  Location: [facility];  Service: Endoscopy;  Laterality: N/A;   DILATION AND CURETTAGE OF UTERUS     ENDOMETRIAL ABLATION     failure    EYE SURGERY  11/16/2009   FOR GLAUCOMA AND LASER SURGERY  X2   GASTRIC BYPASS  [id number]   GLAUCOMA SURGERY  10/31/2009   HERNIA REPAIR     HIATAL HERNIA REPAIR  01/30/2014   Procedure: HERNIA REPAIR HIATAL;  Surgeon: Pedro Earls, MD;  Location: [facility];  Service: General;;   HYSTEROSCOPY     IVC FILTER     2010 - THEN REMOVED THEN PLACED AGAIN 2012   JOINT REPLACEMENT     KNEE ARTHROSCOPY  12/2007   LAPAROSCOPIC GASTRIC BANDING N/A 01/30/2014   Procedure: LAPAROSCOPIC GASTRIC BANDING over bypass ;  Surgeon: Pedro Earls, MD;  Location:  [facility];  Service: General;  Laterality: N/A;   POLYPECTOMY  12/06/2020   Procedure: POLYPECTOMY;  Surgeon: Carol Ada, MD;  Location: [facility];  Service: Endoscopy;;   POSTERIOR LAMINECTOMY / DECOMPRESSION CERVICAL SPINE  01/26/2011   TOTAL KNEE ARTHROPLASTY  09/09/2011   Procedure: TOTAL KNEE ARTHROPLASTY;  Surgeon: Johnn Hai, MD;  Location: [facility];  Service: Orthopedics;  Laterality: Left;   TOTAL KNEE ARTHROPLASTY Right 03/22/2014   Procedure: RIGHT TOTAL KNEE ARTHROPLASTY;  Surgeon: Johnn Hai, MD;  Location: [facility];  Service: Orthopedics;  Laterality: Right;   UPPER GI ENDOSCOPY N/A 01/30/2014   Procedure: UPPER GI ENDOSCOPY;  Surgeon: Pedro Earls, MD;  Location: [facility];  Service: General;  Laterality: N/A;   YAG LASER APPLICATION  04/25/2692   Procedure: YAG LASER APPLICATION;  Surgeon: Myrtha Mantis., MD;  Location: [place];  Service: Ophthalmology;  Laterality: Right;   YAG LASER APPLICATION  8/54/6270   Procedure: YAG LASER APPLICATION;  Surgeon: Myrtha Mantis., MD;  Location: [place];  Service: Ophthalmology;  Laterality: Right;    FAMILY HISTORY Family History  Problem Relation Age of Onset   Arthritis Mother    Hypertension Mother    Diabetes Mother    Stroke Mother    Obesity Mother    Arthritis Father    Diabetes Father    Alcohol abuse Father    Hypertension Father    Sudden death Father    Glaucoma Other    Cerebral aneurysm Other    Stroke Other     SOCIAL HISTORY Social History   Tobacco Use   Smoking status: Never   Smokeless tobacco: Never  Vaping Use   Vaping Use: Never used  Substance Use Topics   Alcohol use: No   Drug use: No         OPHTHALMIC EXAM:  Base Eye Exam     Visual Acuity (ETDRS)       Right Left   Dist [place] 20/150 20/20   Dist ph [place] 20/25 -2  Tonometry (Tonopen, 10:55 AM)       Right Left   Pressure 10 13         Pupils       Pupils APD   Right PERRL None   Left PERRL  None         Visual Fields       Left Right   Restrictions  Partial outer superior temporal, inferior temporal, superior nasal, inferior nasal deficiencies         Extraocular Movement       Right Left    Full Full         Neuro/Psych     Oriented x3: Yes   Mood/Affect: Normal         Dilation     Both eyes: 1.0% Mydriacyl, 2.5% Phenylephrine @ 10:55 AM           Slit Lamp and Fundus Exam     External Exam       Right Left   External Normal Normal         Slit Lamp Exam       Right Left   Lids/Lashes Normal Normal   Conjunctiva/Sclera White and quiet White and quiet   Cornea Clear Clear   Anterior Chamber Deep and quiet Deep and quiet   Iris Round and reactive Round and reactive   Lens Centered posterior chamber intraocular lens Centered posterior chamber intraocular lens   Anterior Vitreous Normal Normal         Fundus Exam       Right Left   Posterior Vitreous Normal Vitrectomized   Disc Advanced cupping, still pink, 2+ Pallor Advanced cupping, still pink   C/D Ratio 0.9 0.85   Macula Microaneurysms, no macular thickening Microaneurysms, no macular thickening   Vessels Old central retinal vein occlusion, collateralized Old central retinal vein occlusion, collateralized   Periphery Laser scars Laser scars            IMAGING AND PROCEDURES  Imaging and Procedures for 01/08/22  OCT, Retina - OU - Both Eyes       Right Eye Quality was good. Scan locations included subfoveal. Central Foveal Thickness: 289. Progression has been stable. Findings include abnormal foveal contour.   Left Eye Quality was good. Scan locations included subfoveal. Central Foveal Thickness: 299. Progression has been stable. Findings include abnormal foveal contour, cystoid macular edema.   Notes Diffuse retinal atrophy macula OD.  OS with small focal region of thickening superior to the fovea, new affecting the center of the vision.  Will observe this  and if CME worsens will consider treatment intravitreal     Color Fundus Photography Optos - OU - Both Eyes       Right Eye Progression has been stable. Disc findings include increased cup to disc ratio. Macula : normal observations. Vessels : normal observations. Periphery : normal observations.   Left Eye Progression has been stable. Disc findings include increased cup to disc ratio. Macula : microaneurysms. Vessels : normal observations. Periphery : normal observations.   Notes Advanced optic nerve cupping OU, stable over time,  OS with superior hemicentral retinal vein occlusion, sector PRP, no active disease  No active maculopathy or vasculopathy OD              ASSESSMENT/PLAN:  Type 2 diabetes mellitus without complication, without long-term current use of insulin (HCC) No detectable diabetic retinopathy  Primary open angle glaucoma of both eyes, severe stage Continues under the  care of Dr. 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     ICD-10-CM   1. 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 Type 2 diabetes mellitus without complication, without long-term current use of insulin (HCC)  E11.9     3. Primary open angle glaucoma of both eyes, severe stage  H40.1133     4. Nondiabetic proliferative retinopathy, left  H35.22       1.  Stable retinopathy OS.  No new findings OU.  2.  Follow-up Dr. Lucianne Lei as scheduled  3.  Ophthalmic Meds Ordered this visit:  No orders of the defined types were placed in this encounter.      Return in about 1 year (around 01/09/2023) for DILATE OU, COLOR FP, OCT.  There are no Patient Instructions on file for this  visit.   Explained the diagnoses, plan, and follow up with the patient and they expressed understanding.  Patient expressed understanding of the importance of proper follow up care.   Clent Demark Tashawn Laswell M.D. Diseases & Surgery of the Retina and Vitreous [facility] [place] 01/08/22     Abbreviations: M myopia (nearsighted); A astigmatism; H hyperopia (farsighted); P presbyopia; Mrx spectacle prescription;  CTL contact lenses; OD right eye; OS left eye; OU both eyes  XT exotropia; ET esotropia; PEK punctate epithelial keratitis; PEE punctate epithelial erosions; DES dry eye syndrome; MGD meibomian gland dysfunction; ATs artificial tears; PFAT's preservative free artificial tears; [place] nuclear sclerotic cataract; PSC posterior subcapsular cataract; ERM epi-retinal membrane; PVD posterior vitreous detachment; RD retinal detachment; DM diabetes mellitus; DR diabetic retinopathy; NPDR non-proliferative diabetic retinopathy; PDR proliferative diabetic retinopathy; CSME clinically significant macular edema; DME diabetic macular edema; dbh dot blot hemorrhages; CWS cotton wool spot; POAG primary open angle glaucoma; C/D cup-to-disc ratio; HVF humphrey visual field; GVF goldmann visual field; OCT optical coherence tomography; IOP intraocular pressure; BRVO Branch retinal vein occlusion; CRVO central retinal vein occlusion; CRAO central retinal artery occlusion; BRAO branch retinal artery occlusion; RT retinal tear; SB scleral buckle; PPV pars plana vitrectomy; VH Vitreous hemorrhage; PRP panretinal laser photocoagulation; IVK intravitreal kenalog; VMT vitreomacular traction; MH Macular hole;  NVD neovascularization of the disc; NVE neovascularization elsewhere; AREDS age related eye disease study; ARMD age related macular degeneration; POAG primary open angle glaucoma; EBMD epithelial/anterior basement membrane dystrophy; ACIOL anterior chamber intraocular lens; IOL intraocular lens; PCIOL posterior chamber  intraocular lens; Phaco/IOL phacoemulsification with intraocular lens placement; [place] photorefractive keratectomy; LASIK laser assisted in situ keratomileusis; HTN hypertension; DM diabetes mellitus; COPD chronic obstructive pulmonary disease

## 2022-01-08 NOTE — Assessment & Plan Note (Signed)
Continues under the care of Dr. Lucianne Lei of [facility] eye care

## 2022-01-08 NOTE — Assessment & Plan Note (Signed)
Controlled previously from neovascularization from Hemi CRVO OS stable

## 2022-03-10 ENCOUNTER — Other Ambulatory Visit: Payer: Self-pay | Admitting: *Deleted

## 2022-03-10 MED ORDER — DUPIXENT 300 MG/2ML ~~LOC~~ SOSY: PREFILLED_SYRINGE | SUBCUTANEOUS | 11 refills | Status: DC

## 2022-04-30 ENCOUNTER — Other Ambulatory Visit: Payer: Self-pay | Admitting: Allergy

## 2022-07-02 ENCOUNTER — Ambulatory Visit: Payer: BC Managed Care – PPO | Admitting: Allergy

## 2022-07-30 ENCOUNTER — Ambulatory Visit: Payer: BC Managed Care – PPO | Admitting: Allergy

## 2022-07-30 ENCOUNTER — Other Ambulatory Visit: Payer: Self-pay

## 2022-07-30 ENCOUNTER — Encounter: Payer: Self-pay | Admitting: Allergy

## 2022-07-30 VITALS — BP 122/80 | HR 74 | Temp 97.7°F | Resp 18 | Ht 64.0 in | Wt 308.8 lb

## 2022-07-30 DIAGNOSIS — J3089 Other allergic rhinitis: Secondary | ICD-10-CM | POA: Diagnosis not present

## 2022-07-30 DIAGNOSIS — J4540 Moderate persistent asthma, uncomplicated: Secondary | ICD-10-CM | POA: Diagnosis not present

## 2022-07-30 DIAGNOSIS — K219 Gastro-esophageal reflux disease without esophagitis: Secondary | ICD-10-CM | POA: Diagnosis not present

## 2022-07-30 DIAGNOSIS — H1013 Acute atopic conjunctivitis, bilateral: Secondary | ICD-10-CM | POA: Diagnosis not present

## 2022-07-30 MED ORDER — AZELASTINE HCL 0.1 % NA SOLN: NASAL | 5 refills | Status: DC

## 2022-07-30 MED ORDER — OLOPATADINE HCL 0.2 % OP SOLN: 1.0000 [drp] | Freq: Every day | OPHTHALMIC | 5 refills | Status: DC | PRN

## 2022-07-30 MED ORDER — LEVALBUTEROL TARTRATE 45 MCG/ACT IN AERO: 2.0000 | INHALATION_SPRAY | RESPIRATORY_TRACT | 1 refills | Status: DC | PRN

## 2022-07-30 MED ORDER — TRELEGY ELLIPTA 200-62.5-25 MCG/ACT IN AEPB: 1.0000 | INHALATION_SPRAY | Freq: Every day | RESPIRATORY_TRACT | 5 refills | Status: DC

## 2022-07-30 MED ORDER — MONTELUKAST SODIUM 10 MG PO TABS: 10.0000 mg | ORAL_TABLET | Freq: Every day | ORAL | 5 refills | Status: DC

## 2022-07-30 MED ORDER — OMEPRAZOLE 20 MG PO CPDR: 20.0000 mg | DELAYED_RELEASE_CAPSULE | Freq: Every day | ORAL | 5 refills | Status: DC

## 2022-07-30 NOTE — Patient Instructions (Addendum)
Asthma - do well - continue Trelegy 1 inhalation once a day.  Rinse mouth after use - continue Singulair 10mg  daily - have access to Xopenex inhaler 2 puffs every 4-6 hours as needed for cough/wheeze/shortness of breath/chest tightness.  May use 15-20 minutes prior to activity.   Monitor frequency of use.   - continue Dupixent injections every 2 weeks self-administered. Alternate your injection sites.   Asthma control goals:  Full participation in all desired activities (may need albuterol before activity) Albuterol use two time or less a week on average (not counting use with activity) Cough interfering with sleep two time or less a month Oral steroids no more than once a year No hospitalizations  Allergies - flaring some with pollen   - continue your allergen avoidance measures  - continue use of Claritin at this time.  Can rotate your antihistamine every 3-6 months  - use Claritin-D sparingly for sinus congestion/headache symptoms  - singulair as above helps also with allergy symptom control  - for itchy/watery eyes can continue use of as needed [place] or [place]  - for nasal congestion/stuffiness use of Nasonex or Nasacort 1-2 sprays each nostril daily as needed  - for nasal drainage/post-nasal drip use Astelin 2 sprays each nostril 2 times a day for symptom control  - recommend starting back nasal saline rinse twice a day at this time.  Rinse kit provide.  Use saline rinse kit prior to nasal spray use.  Use distilled water or boil water and bring to room temperature (do not use tap water).   Reflux  - reflux symptoms can be triggers of asthma symptoms  - use Omeprazole as needed for reflux control  Itching   - can try use of Opzelura thin layer to areas that itch twice a day if needed.  This is a non-steroid ointment that can be use anywhere on body if needed  Follow-up 6 months or sooner if needed

## 2022-07-30 NOTE — Progress Notes (Signed)
Follow-up Note  RE: [name] MRN: [id number] DOB: 06-13-1960 Date of Office Visit: 07/30/2022   History of present illness: [name] is a 62 y.o. female presenting today for follow-up of asthma, allergic rhinitis with conjunctivitis and reflux.  She was last seen in the office on 01/01/2022 by myself.  She states she has been doing well since this visit.  She feels like her breathing is doing well.  She continues on Trelegy 200 mcg taking 1 puff daily as well as Singulair daily.  She is on Dupixent injections every 2 weeks that she self administers.  She is due today for her injection.  She has not needed to use her rescue inhaler as of this regimen.  She is doing very well.  She denies any need for urgent care visits or any systemic steroid needs. With her allergies she is noting some headaches about twice a week.  She did speak with the pharmacy staff and they recommended she could use of Claritin-D which she did get and has only taken 2 tablets thus far.  It did help.  She normally takes plain Claritin.  She also allergy based eyedrop like Pataday as well as use of her nasal sprays Nasonex and Astelin. With her reflux she does continue on omeprazole as needed for control. She does mention today that she has been noting some itching of her arms without rash.  She is not sure of what might be triggering this.  She has not had any changes in foods or her day-to-day routine.    Review of systems: Review of Systems  Constitutional: Negative.   HENT: Negative.    Eyes: Negative.   Respiratory: Negative.    Cardiovascular: Negative.   Gastrointestinal: Negative.   Musculoskeletal: Negative.   Skin:        See HPI  Allergic/Immunologic: Negative.   Neurological:  Positive for headaches.     All other systems negative unless noted above in HPI  Past medical/social/surgical/family history have been reviewed and are unchanged unless specifically indicated below.  No  changes  Medication List: Current Outpatient Medications  Medication Sig Dispense Refill   acetaminophen (TYLENOL) 500 MG tablet Take 1,000 mg by mouth daily as needed (pain.).     Ascorbic Acid (VITAMIN C) 1000 MG tablet Take 1,000 mg by mouth daily as needed (immune support). Emergen C     B Complex-Folic Acid (B COMPLEX FORMULA 1 PO) Take 1 drop by mouth in the morning.     BEE POLLEN PO Take 1 tablet by mouth in the morning and at bedtime. Bee Extract     bimatoprost (LUMIGAN) 0.01 % SOLN Place 1 drop into both eyes at bedtime.     BLACK ELDERBERRY PO Take 4.4 g by mouth in the morning.     brinzolamide (AZOPT) 1 % ophthalmic suspension Place 1 drop into both eyes 3 (three) times daily.     Calcium Carbonate-Vit D-Min (CALTRATE 600+D PLUS MINERALS PO) Take 2 tablets by mouth 3 (three) times daily as needed (bone health).     COMBIGAN 0.2-0.5 % ophthalmic solution Place 1 drop into both eyes 3 (three) times daily.     Cyanocobalamin (VITAMIN B-12) 2500 MCG SUBL Place 5,000 mcg under the tongue at bedtime.     diltiazem (CARDIZEM CD) 240 MG 24 hr capsule Take 240 mg by mouth at bedtime.     Docosahexaenoic Acid (DHA PO) Take 2 tablets by mouth at bedtime. DHA [employer]aturals  dupilumab (DUPIXENT) 300 MG/2ML prefilled syringe INJECT 1 SYRINGE UNDER THE SKIN EVERY OTHER WEEK 4 mL 11   fexofenadine (ALLEGRA) 180 MG tablet Take 180 mg by mouth daily as needed for allergies.     Fluticasone-Umeclidin-Vilant (TRELEGY ELLIPTA) 200-62.5-25 MCG/ACT AEPB Inhale 1 puff into the lungs daily. 60 each 5   furosemide (LASIX) 40 MG tablet Take 40 mg by mouth daily as needed (fluid retention).     Latanoprostene Bunod 0.024 % SOLN Place 1 drop into both eyes daily.     levalbuterol (XOPENEX) 1.25 MG/3ML nebulizer solution Take 1.25 mg by nebulization every 6 (six) hours as needed for wheezing or shortness of breath.     levocetirizine (XYZAL) 5 MG tablet TAKE 1 TABLET(5 MG) BY MOUTH EVERY EVENING 90  tablet 0   loratadine-pseudoephedrine (ALAVERT D-12 HOUR ALLERGY/CONG) 5-120 MG tablet Take 1 tablet by mouth 2 (two) times daily as needed for allergies.     magnesium chloride (SLOW-MAG) 64 MG TBEC Take 1-2 tablets by mouth See admin instructions. Take 2 tablets by mouth every morning and 1 tablet by mouth with dinner     meloxicam (MOBIC) 7.5 MG tablet Take 7.5 mg by mouth daily as needed for pain.     mometasone (NASONEX) 50 MCG/ACT nasal spray USE 2 SPRAYS IN EACH NOSTRIL DAILY 17 g 5   Multiple Vitamin (MULTIVITAMIN WITH MINERALS) TABS tablet Take 2 tablets by mouth every evening.     potassium chloride SA (K-DUR,KLOR-CON) 20 MEQ tablet Take 20-40 mEq by mouth See admin instructions. Take 2 tablets (40 meq) by mouth with breakfast and 1 tablet (20 meq) by mouth with dinner     prednisoLONE acetate (PRED FORTE) 1 % ophthalmic suspension Place 1 drop into the left eye daily as needed (inflammation. (Max 2-3 days/week)).     Propylene Glycol 0.6 % SOLN Place 1 drop into both eyes 3 (three) times daily as needed.     pyridOXINE (VITAMIN B-6) 100 MG tablet Take 200 mg by mouth at bedtime.     quiNINE (QUALAQUIN) 324 MG capsule Take 324 mg by mouth at bedtime.      RHOPRESSA 0.02 % SOLN Place 1 drop into the right eye in the morning.     rivaroxaban (XARELTO) 20 MG TABS tablet Take 20 mg by mouth at bedtime.      Sodium Chloride-Sodium Bicarb (NETI POT SINUS WASH NA) Place 1 application into the nose daily as needed (congestion).     TRELEGY ELLIPTA 200-62.5-25 MCG/ACT AEPB Inhale 1 puff into the lungs daily. 60 each 5   valsartan-hydrochlorothiazide (DIOVAN-HCT) 80-12.5 MG per tablet Take 1 tablet by mouth in the morning. MID MORNING     Vitamin D, Ergocalciferol, (DRISDOL) 50000 UNITS CAPS capsule Take 50,000 Units by mouth every Friday at 6 PM.     zinc sulfate 220 (50 Zn) MG capsule Take 220 mg by mouth in the morning. Mid morning     azelastine (ASTELIN) 0.1 % nasal spray USE 2 SPRAYS IN EACH  NOSTRIL TWICE DAILY AS NEEDED(NASAL DRAINAGE) AS DIRECTED 30 mL 5   levalbuterol (XOPENEX HFA) 45 MCG/ACT inhaler Inhale 2 puffs into the lungs every 4 (four) hours as needed. 15 g 1   montelukast (SINGULAIR) 10 MG tablet Take 1 tablet (10 mg total) by mouth at bedtime. 30 tablet 5   Olopatadine HCl 0.2 % SOLN Apply 1 drop to eye daily as needed (Itchy, watery eyes). 2.5 mL 5   omeprazole (PRILOSEC) 20 MG capsule  Take 1 capsule (20 mg total) by mouth daily. 30 capsule 5   No current facility-administered medications for this visit.     Known medication allergies: Allergies  Allergen Reactions   Hydrocodone-Acetaminophen Itching and Rash   Beta Adrenergic Blockers Other (See Comments)    RED EYES AND CONGESTION   Penicillins Hives, Itching and Swelling   Sulfa Antibiotics Hives    RED EYES/RED CHEST   Thimerosal (Thiomersal) Hives and Itching     Physical examination: Blood pressure 122/80, pulse 74, temperature 97.7 F (36.5 C), temperature source Temporal, resp. rate 18, height 5\' 4"  (1.626 m), weight (!) 308 lb 12.8 oz (140.1 kg), last menstrual period 06/02/2011, SpO2 96 %.  General: Alert, interactive, in no acute distress. HEENT: PERRLA, TMs pearly gray, turbinates non-edematous without discharge, post-pharynx non erythematous. Neck: Supple without lymphadenopathy. Lungs: Clear to auscultation without wheezing, rhonchi or rales. {no increased work of breathing. CV: Normal S1, S2 without murmurs. Abdomen: Nondistended, nontender. Skin: Warm and dry, without lesions or rashes. Extremities:  No clubbing, cyanosis or edema. Neuro:   Grossly intact.  Diagnositics/Labs:  Spirometry: FEV1: 1.6L 77%, FVC: 2.06L 79%, ratio consistent with nonostructive pattern  Assessment and plan: Asthma - do well - continue Trelegy 1 inhalation once a day.  Rinse mouth after use - continue Singulair 10mg  daily - have access to Xopenex inhaler 2 puffs every 4-6 hours as needed for  cough/wheeze/shortness of breath/chest tightness.  May use 15-20 minutes prior to activity.   Monitor frequency of use.   - continue Dupixent injections every 2 weeks self-administered. Alternate your injection sites.   Asthma control goals:  Full participation in all desired activities (may need albuterol before activity) Albuterol use two time or less a week on average (not counting use with activity) Cough interfering with sleep two time or less a month Oral steroids no more than once a year No hospitalizations  Allergies - flaring some with pollen   - continue your allergen avoidance measures  - continue use of Claritin at this time.  Can rotate your antihistamine every 3-6 months  - use Claritin-D sparingly for sinus congestion/headache symptoms  - singulair as above helps also with allergy symptom control  - for itchy/watery eyes can continue use of as needed Pazeo or Pataday  - for nasal congestion/stuffiness use of Nasonex or Nasacort 1-2 sprays each nostril daily as needed  - for nasal drainage/post-nasal drip use Astelin 2 sprays each nostril 2 times a day for symptom control  - recommend starting back nasal saline rinse twice a day at this time.  Rinse kit provide.  Use saline rinse kit prior to nasal spray use.  Use distilled water or boil water and bring to room temperature (do not use tap water).   Reflux  - reflux symptoms can be triggers of asthma symptoms  - use Omeprazole as needed for reflux control  Itching   - can try use of Opzelura thin layer to areas that itch twice a day if needed.  This is a non-steroid ointment that can be use anywhere on body if needed  Follow-up 6 months or sooner if needed  I appreciate the opportunity to take part in Elanor's care. Please do not hesitate to contact me with questions.  Sincerely,   Margo Aye, MD Allergy/Immunology [facility]

## 2022-08-18 ENCOUNTER — Other Ambulatory Visit: Payer: Self-pay | Admitting: Allergy

## 2022-12-25 ENCOUNTER — Ambulatory Visit: Payer: BC Managed Care – PPO | Attending: Cardiovascular Disease | Admitting: Cardiovascular Disease

## 2022-12-25 ENCOUNTER — Encounter: Payer: Self-pay | Admitting: Cardiovascular Disease

## 2022-12-25 VITALS — BP 132/68 | HR 67 | Ht 64.0 in | Wt 308.6 lb

## 2022-12-25 DIAGNOSIS — I10 Essential (primary) hypertension: Secondary | ICD-10-CM

## 2022-12-25 DIAGNOSIS — M79671 Pain in right foot: Secondary | ICD-10-CM | POA: Insufficient documentation

## 2022-12-25 NOTE — Assessment & Plan Note (Signed)
History of essential hypertension blood pressure measured today at 132/68.  She is on diltiazem, Diovan and hydrochlorothiazide.

## 2022-12-25 NOTE — Patient Instructions (Signed)
Medication Instructions:  Your physician recommends that you continue on your current medications as directed. Please refer to the Current Medication list given to you today.  *If you need a refill on your cardiac medications before your next appointment, please call your pharmacy*   Follow-Up: At [facility], you and your health needs are our priority.  As part of our continuing mission to provide you with exceptional heart care, we have created designated Provider Care Teams.  These Care Teams include your primary Cardiologist (physician) and Advanced Practice Providers (APPs -  Physician Assistants and Nurse Practitioners) who all work together to provide you with the care you need, when you need it.     Your next appointment:   As needed   Provider:   Dr Allyson Sabal

## 2022-12-25 NOTE — Progress Notes (Signed)
12/25/2022 [name]   10/24/1960  [id number]  Primary Physician [name], MD Primary Cardiologist: Runell Gess MD Nicholes Calamity, [place]  HPI:  [name] is a 62 y.o. morbidly overweight (BMI 53) single African-American female with no children who works as a Agricultural consultant at [employer].  She was referred by Dr. Victorino Dike, her orthopedist, for PAD evaluation because of pain in her right foot.  She also has seen Dr. Jillyn Hidden, neurosurgeon for evaluation of this as well.  Her only cardiac risk factor is treated hypertension.  There is no family history of heart disease.  She is never had a heart attack or stroke.  She does have reactive airways disease.  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  She specifically denies chest pain or claudication.  She is treated with oral prednisone and the pain in her right foot resolved.  She was told that it was potentially related to "a pinched nerve in her back".   Current Meds  Medication Sig   acetaminophen (TYLENOL) 500 MG tablet Take 1,000 mg by mouth daily as needed (pain.).   Ascorbic Acid (VITAMIN C) 1000 MG tablet Take 1,000 mg by mouth daily as needed (immune support). Emergen C   azelastine (ASTELIN) 0.1 % nasal spray USE 2 SPRAYS IN EACH NOSTRIL TWICE DAILY AS NEEDED(NASAL DRAINAGE) AS DIRECTED   B Complex-Folic Acid (B COMPLEX FORMULA 1 PO) Take 1 drop by mouth in the morning.   BEE POLLEN PO Take 1 tablet by mouth in the morning and at bedtime. Bee Extract   bimatoprost (LUMIGAN) 0.01 % SOLN Place 1 drop into both eyes at bedtime.   BLACK ELDERBERRY PO Take 4.4 g by mouth in the morning.   brinzolamide (AZOPT) 1 % ophthalmic suspension Place 1 drop into both eyes 3 (three) times daily.   Calcium Carbonate-Vit D-Min (CALTRATE 600+D PLUS MINERALS PO) Take 2 tablets by mouth 3 (three) times daily as needed (bone health).   COMBIGAN 0.2-0.5 % ophthalmic solution Place 1 drop into both  eyes 3 (three) times daily.   Cyanocobalamin (VITAMIN B-12) 2500 MCG SUBL Place 5,000 mcg under the tongue at bedtime.   diltiazem (CARDIZEM CD) 240 MG 24 hr capsule Take 240 mg by mouth at bedtime.   Docosahexaenoic Acid (DHA PO) Take 2 tablets by mouth at bedtime. DHA [employer]   dupilumab (DUPIXENT) 300 MG/2ML prefilled syringe INJECT 1 SYRINGE UNDER THE SKIN EVERY OTHER WEEK   fexofenadine (ALLEGRA) 180 MG tablet Take 180 mg by mouth daily as needed for allergies.   furosemide (LASIX) 40 MG tablet Take 40 mg by mouth daily as needed (fluid retention).   Latanoprostene Bunod 0.024 % SOLN Place 1 drop into both eyes daily.   levalbuterol (XOPENEX HFA) 45 MCG/ACT inhaler Inhale 2 puffs into the lungs every 4 (four) hours as needed.   levalbuterol (XOPENEX) 1.25 MG/3ML nebulizer solution Take 1.25 mg by nebulization every 6 (six) hours as needed for wheezing or shortness of breath.   levocetirizine (XYZAL) 5 MG tablet TAKE 1 TABLET(5 MG) BY MOUTH EVERY EVENING   loratadine-pseudoephedrine (ALAVERT D-12 HOUR ALLERGY/CONG) 5-120 MG tablet Take 1 tablet by mouth 2 (two) times daily as needed for allergies.   magnesium chloride (SLOW-MAG) 64 MG TBEC Take 1-2 tablets by mouth See admin instructions. Take 2 tablets by mouth every morning and 1 tablet by mouth with dinner   meloxicam (MOBIC) 7.5 MG  tablet Take 7.5 mg by mouth daily as needed for pain.   mometasone (NASONEX) 50 MCG/ACT nasal spray USE 2 SPRAYS IN EACH NOSTRIL DAILY   montelukast (SINGULAIR) 10 MG tablet Take 1 tablet (10 mg total) by mouth at bedtime.   Multiple Vitamin (MULTIVITAMIN WITH MINERALS) TABS tablet Take 2 tablets by mouth every evening.   Olopatadine HCl 0.2 % SOLN Apply 1 drop to eye daily as needed (Itchy, watery eyes).   omeprazole (PRILOSEC) 20 MG capsule Take 1 capsule (20 mg total) by mouth daily.   potassium chloride SA (K-DUR,KLOR-CON) 20 MEQ tablet Take 20-40 mEq by mouth See admin instructions. Take 2 tablets  (40 meq) by mouth with breakfast and 1 tablet (20 meq) by mouth with dinner   prednisoLONE acetate (PRED FORTE) 1 % ophthalmic suspension Place 1 drop into the left eye daily as needed (inflammation. (Max 2-3 days/week)).   Propylene Glycol 0.6 % SOLN Place 1 drop into both eyes 3 (three) times daily as needed.   pyridOXINE (VITAMIN B-6) 100 MG tablet Take 200 mg by mouth at bedtime.   quiNINE (QUALAQUIN) 324 MG capsule Take 324 mg by mouth at bedtime.    RHOPRESSA 0.02 % SOLN Place 1 drop into the right eye in the morning.   rivaroxaban (XARELTO) 20 MG TABS tablet Take 20 mg by mouth at bedtime.    Sodium Chloride-Sodium Bicarb (NETI POT SINUS WASH NA) Place 1 application into the nose daily as needed (congestion).   TRELEGY ELLIPTA 200-62.5-25 MCG/ACT AEPB Inhale 1 puff into the lungs daily.   valsartan-hydrochlorothiazide (DIOVAN-HCT) 80-12.5 MG per tablet Take 1 tablet by mouth in the morning. MID MORNING   Vitamin D, Ergocalciferol, (DRISDOL) 50000 UNITS CAPS capsule Take 50,000 Units by mouth every Friday at 6 PM.   zinc sulfate 220 (50 Zn) MG capsule Take 220 mg by mouth in the morning. Mid morning     Allergies  Allergen Reactions   Hydrocodone-Acetaminophen Itching and Rash   Beta Adrenergic Blockers Other (See Comments)    RED EYES AND CONGESTION   Penicillins Hives, Itching and Swelling   Sulfa Antibiotics Hives    RED EYES/RED CHEST   Thimerosal (Thiomersal) Hives and Itching    Social History   Socioeconomic History   Marital status: Single    Spouse name: Not on file   Number of children: Not on file   Years of education: Not on file   Highest education level: Not on file  Occupational History   Not on file  Tobacco Use   Smoking status: Never   Smokeless tobacco: Never  Vaping Use   Vaping status: Never Used  Substance and Sexual Activity   Alcohol use: No   Drug use: No   Sexual activity: Not on file  Other Topics Concern   Not on file  Social History  Narrative   Not on file   Social Determinants of Health   Financial Resource Strain: Not on file  Food Insecurity: Not on file  Transportation Needs: Not on file  Physical Activity: Not on file  Stress: Not on file  Social Connections: Unknown (10/06/2022)   [facility], [facility]   Social Network    Social Network: Not on file  Intimate Partner Violence: Not At Risk (10/06/2022)   Received from [facility], [facility]   HITS    Over the last 12 months how often did your partner physically hurt you?: 1    Over the last  12 months how often did your partner insult you or talk down to you?: 1    Over the last 12 months how often did your partner threaten you with physical harm?: 1    Over the last 12 months how often did your partner scream or curse at you?: 1     Review of Systems: General: negative for chills, fever, night sweats or weight changes.  Cardiovascular: negative for chest pain, dyspnea on exertion, edema, orthopnea, palpitations, paroxysmal nocturnal dyspnea or shortness of breath Dermatological: negative for rash Respiratory: negative for cough or wheezing Urologic: negative for hematuria Abdominal: negative for nausea, vomiting, diarrhea, bright red blood per rectum, melena, or hematemesis Neurologic: negative for visual changes, syncope, or dizziness All other systems reviewed and are otherwise negative except as noted above.    Blood pressure 132/68, pulse 67, height 5\' 4"  (1.626 m), weight (!) 308 lb 9.6 oz (140 kg), last menstrual period 06/02/2011, SpO2 98%.  General appearance: alert and no distress Neck: no adenopathy, no carotid bruit, no JVD, supple, symmetrical, trachea midline, and thyroid not enlarged, symmetric, no tenderness/mass/nodules Lungs: clear to auscultation bilaterally Heart: Regular rate and rhythm without murmurs gallops rubs or clicks. Extremities: extremities normal, atraumatic, no cyanosis or edema Pulses:  2+ Skin: Skin color, texture, turgor normal. No rashes or lesions Neurologic: Grossly normal  EKG EKG Interpretation Date/Time:  Friday December 25 2022 09:16:54 EDT Ventricular Rate:  67 PR Interval:  128 QRS Duration:  78 QT Interval:  406 QTC Calculation: 429 R Axis:   48  Text Interpretation: Sinus rhythm with Premature atrial complexes When compared with ECG of 01-Oct-2013 09:56, Premature atrial complexes are now Present Confirmed by Nanetta Batty [phone]) on 12/25/2022 9:31:36 AM    ASSESSMENT AND PLAN:   HYPERTENSION History of essential hypertension blood pressure measured today at 132/68.  She is on diltiazem, Diovan and hydrochlorothiazide.  Right foot pain Ms. Ayler  was referred to me by Dr. Victorino Dike because of pain in her right foot.  She also saw a neurosurgeon.  He after being treated with oral prednisone her pain resolved.  She has no symptoms of claudication.  Palpable pedal pulse.  I do not think her symptoms were vascular nature and do not feel compelled to further pursue this.     Runell Gess MD FACP,FACC,FAHA, [facility] 12/25/2022 9:44 AM

## 2022-12-25 NOTE — Assessment & Plan Note (Signed)
Ms. Gridley  was referred to me by Dr. Victorino Dike because of pain in her right foot.  She also saw a neurosurgeon.  He after being treated with oral prednisone her pain resolved.  She has no symptoms of claudication.  Palpable pedal pulse.  I do not think her symptoms were vascular nature and do not feel compelled to further pursue this.

## 2023-01-08 ENCOUNTER — Ambulatory Visit
Admission: RE | Admit: 2023-01-08 | Discharge: 2023-01-08 | Disposition: A | Discharge status: Home / Self Care | Payer: BC Managed Care – PPO | Source: Ambulatory Visit | Attending: Family Medicine | Admitting: Family Medicine

## 2023-01-08 ENCOUNTER — Encounter: Payer: Self-pay | Admitting: Family Medicine

## 2023-01-08 ENCOUNTER — Other Ambulatory Visit: Payer: Self-pay | Admitting: Family Medicine

## 2023-01-08 DIAGNOSIS — R1084 Generalized abdominal pain: Secondary | ICD-10-CM

## 2023-01-11 ENCOUNTER — Encounter (INDEPENDENT_AMBULATORY_CARE_PROVIDER_SITE_OTHER): Payer: BC Managed Care – PPO | Admitting: Ophthalmology

## 2023-01-11 ENCOUNTER — Ambulatory Visit (HOSPITAL_COMMUNITY)
Admission: RE | Admit: 2023-01-11 | Discharge: 2023-01-11 | Disposition: A | Discharge status: Home / Self Care | Payer: BC Managed Care – PPO | Source: Ambulatory Visit | Attending: Family Medicine | Admitting: Family Medicine

## 2023-01-11 ENCOUNTER — Encounter (HOSPITAL_COMMUNITY): Payer: Self-pay

## 2023-01-11 ENCOUNTER — Other Ambulatory Visit (HOSPITAL_COMMUNITY): Payer: Self-pay | Admitting: Family Medicine

## 2023-01-11 DIAGNOSIS — Z0289 Encounter for other administrative examinations: Secondary | ICD-10-CM

## 2023-01-11 DIAGNOSIS — R109 Unspecified abdominal pain: Secondary | ICD-10-CM | POA: Diagnosis present

## 2023-01-11 MED ORDER — IOHEXOL 9 MG/ML PO SOLN
1000.0000 mL | ORAL | Status: AC
  Administered 2023-01-11: 1000 mL via ORAL

## 2023-01-11 MED ORDER — IOHEXOL 9 MG/ML PO SOLN
ORAL | Status: AC
  Filled 2023-01-11: qty 1000

## 2023-01-11 MED ORDER — SODIUM CHLORIDE (PF) 0.9 % IJ SOLN
INTRAMUSCULAR | Status: AC
  Filled 2023-01-11: qty 50

## 2023-01-11 MED ORDER — IOHEXOL 300 MG/ML  SOLN
100.0000 mL | Freq: Once | INTRAMUSCULAR | Status: AC | PRN
  Administered 2023-01-11: 100 mL via INTRAVENOUS

## 2023-01-15 ENCOUNTER — Ambulatory Visit (HOSPITAL_COMMUNITY): Payer: BC Managed Care – PPO

## 2023-01-20 ENCOUNTER — Telehealth: Payer: Self-pay

## 2023-01-20 LAB — HM DIABETES EYE EXAM

## 2023-01-20 NOTE — Telephone Encounter (Signed)
Received faxed [facility] office notes - DOB verified.  Office notes have been placed in provider's in basket for review.  Forwarding message to provider as update.

## 2023-01-21 ENCOUNTER — Telehealth: Payer: Self-pay | Admitting: Family Medicine

## 2023-01-21 NOTE — Telephone Encounter (Signed)
Called patient and left voicemail to let her know her packet needs to be picked up before her 10/9 appointment.

## 2023-01-26 ENCOUNTER — Other Ambulatory Visit: Payer: Self-pay

## 2023-01-26 MED ORDER — OMEPRAZOLE 20 MG PO CPDR: 20.0000 mg | DELAYED_RELEASE_CAPSULE | Freq: Every day | ORAL | 0 refills | Status: DC

## 2023-01-27 ENCOUNTER — Other Ambulatory Visit: Payer: Self-pay | Admitting: Allergy

## 2023-01-27 ENCOUNTER — Encounter: Payer: Self-pay | Admitting: Family Medicine

## 2023-01-27 ENCOUNTER — Ambulatory Visit: Payer: BC Managed Care – PPO | Admitting: Family Medicine

## 2023-01-27 VITALS — BP 129/83 | HR 60 | Temp 97.7°F | Ht 64.5 in | Wt 300.0 lb

## 2023-01-27 DIAGNOSIS — R635 Abnormal weight gain: Secondary | ICD-10-CM

## 2023-01-27 DIAGNOSIS — R5383 Other fatigue: Secondary | ICD-10-CM | POA: Diagnosis not present

## 2023-01-27 DIAGNOSIS — Z9884 Bariatric surgery status: Secondary | ICD-10-CM

## 2023-01-27 DIAGNOSIS — F419 Anxiety disorder, unspecified: Secondary | ICD-10-CM | POA: Diagnosis not present

## 2023-01-27 DIAGNOSIS — E1169 Type 2 diabetes mellitus with other specified complication: Secondary | ICD-10-CM | POA: Diagnosis not present

## 2023-01-27 DIAGNOSIS — G4733 Obstructive sleep apnea (adult) (pediatric): Secondary | ICD-10-CM | POA: Diagnosis not present

## 2023-01-27 DIAGNOSIS — Z1331 Encounter for screening for depression: Secondary | ICD-10-CM

## 2023-01-27 DIAGNOSIS — F32A Depression, unspecified: Secondary | ICD-10-CM | POA: Diagnosis not present

## 2023-01-27 DIAGNOSIS — Z6841 Body Mass Index (BMI) 40.0 and over, adult: Secondary | ICD-10-CM

## 2023-01-27 DIAGNOSIS — R0602 Shortness of breath: Secondary | ICD-10-CM

## 2023-01-28 LAB — CBC WITH DIFFERENTIAL/PLATELET
Basophils Absolute: 0.1 10*3/uL (ref 0.0–0.2)
Basos: 1 %
EOS (ABSOLUTE): 0.6 10*3/uL — ABNORMAL HIGH (ref 0.0–0.4)
Eos: 11 %
Hematocrit: 43.3 % (ref 34.0–46.6)
Hemoglobin: 13.8 g/dL (ref 11.1–15.9)
Immature Grans (Abs): 0 10*3/uL (ref 0.0–0.1)
Immature Granulocytes: 0 %
Lymphocytes Absolute: 2 10*3/uL (ref 0.7–3.1)
Lymphs: 38 %
MCH: 27.3 pg (ref 26.6–33.0)
MCHC: 31.9 g/dL (ref 31.5–35.7)
MCV: 86 fL (ref 79–97)
Monocytes Absolute: 0.4 10*3/uL (ref 0.1–0.9)
Monocytes: 7 %
Neutrophils Absolute: 2.3 10*3/uL (ref 1.4–7.0)
Neutrophils: 43 %
Platelets: 149 10*3/uL — ABNORMAL LOW (ref 150–450)
RBC: 5.05 x10E6/uL (ref 3.77–5.28)
RDW: 14.1 % (ref 11.7–15.4)
WBC: 5.3 10*3/uL (ref 3.4–10.8)

## 2023-01-28 LAB — COMPREHENSIVE METABOLIC PANEL
ALT: 23 [IU]/L (ref 0–32)
AST: 16 [IU]/L (ref 0–40)
Albumin: 3.8 g/dL — ABNORMAL LOW (ref 3.9–4.9)
Alkaline Phosphatase: 94 [IU]/L (ref 44–121)
BUN/Creatinine Ratio: 10 — ABNORMAL LOW (ref 12–28)
BUN: 11 mg/dL (ref 8–27)
Bilirubin Total: 0.4 mg/dL (ref 0.0–1.2)
CO2: 22 mmol/L (ref 20–29)
Calcium: 9.3 mg/dL (ref 8.7–10.3)
Chloride: 108 mmol/L — ABNORMAL HIGH (ref 96–106)
Creatinine, Ser: 1.13 mg/dL — ABNORMAL HIGH (ref 0.57–1.00)
Globulin, Total: 2.8 g/dL (ref 1.5–4.5)
Glucose: 88 mg/dL (ref 70–99)
Potassium: 4.5 mmol/L (ref 3.5–5.2)
Sodium: 144 mmol/L (ref 134–144)
Total Protein: 6.6 g/dL (ref 6.0–8.5)
eGFR: 55 mL/min/{1.73_m2} — ABNORMAL LOW (ref >59)

## 2023-01-28 LAB — LIPID PANEL
Chol/HDL Ratio: 3.1 {ratio} (ref 0.0–4.4)
Cholesterol, Total: 169 mg/dL (ref 100–199)
HDL: 55 mg/dL (ref >39)
LDL Chol Calc (NIH): 99 mg/dL (ref 0–99)
Triglycerides: 79 mg/dL (ref 0–149)
VLDL Cholesterol Cal: 15 mg/dL (ref 5–40)

## 2023-01-28 LAB — IRON AND TIBC
Iron Saturation: 30 % (ref 15–55)
Iron: 91 ug/dL (ref 27–139)
Total Iron Binding Capacity: 301 ug/dL (ref 250–450)
UIBC: 210 ug/dL (ref 118–369)

## 2023-01-28 LAB — HEMOGLOBIN A1C
Est. average glucose Bld gHb Est-mCnc: 111 mg/dL
Hgb A1c MFr Bld: 5.5 % (ref 4.8–5.6)

## 2023-01-28 LAB — VITAMIN B12: Vitamin B-12: >2000 pg/mL — ABNORMAL HIGH (ref 232–1245)

## 2023-01-28 LAB — PREALBUMIN: PREALBUMIN: 22 mg/dL (ref 10–36)

## 2023-01-28 LAB — T4, FREE: Free T4: 1.22 ng/dL (ref 0.82–1.77)

## 2023-01-28 LAB — FERRITIN: Ferritin: 30 ng/mL (ref 15–150)

## 2023-01-28 LAB — VITAMIN D 25 HYDROXY (VIT D DEFICIENCY, FRACTURES): Vit D, 25-Hydroxy: 83.8 ng/mL (ref 30.0–100.0)

## 2023-01-28 LAB — FOLATE: Folate: >20 ng/mL (ref >3.0)

## 2023-01-28 LAB — INSULIN, RANDOM: INSULIN: 25.1 u[IU]/mL — ABNORMAL HIGH (ref 2.6–24.9)

## 2023-01-28 LAB — T3: T3, Total: 126 ng/dL (ref 71–180)

## 2023-01-28 LAB — TSH: TSH: 2.96 u[IU]/mL (ref 0.450–4.500)

## 2023-02-01 NOTE — Progress Notes (Signed)
Chief Complaint:   OBESITY [name] (MR# [id number]) is a 62 y.o. female who presents for evaluation and treatment of obesity and related comorbidities. Current BMI is Body mass index is 50.7 kg/m. [name] has been struggling with her weight for many years and has been unsuccessful in either losing weight, maintaining weight loss, or reaching her healthy weight goal.  [name] is currently in the action stage of change and ready to dedicate time achieving and maintaining a healthier weight. [name] is interested in becoming our patient and working on intensive lifestyle modifications including (but not limited to) diet and exercise for weight loss.  Patient was last seen in 2019 by [name] at 272 pounds.  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 [name].  She is in physical therapy 2 times a week.  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  She eats out daily, and she drinks sweet tea, and eats junk food snacks.  She lives alone.  She gained weight when her brothers died and after the pandemic.  [name]'s habits were reviewed today and are as follows: her desired weight loss is 101 lbs, she has been heavy most of her life, she started gaining excessive weight after the death of her brothers, and during the pandemic, her heaviest weight ever was 455 pounds, she is a picky eater and doesn't like to eat healthier foods, she has significant food cravings issues, she snacks frequently in the evenings, she wakes up frequently in the middle of the night to eat, she skips meals frequently, she is frequently drinking liquids with calories, she frequently makes poor food choices, she has problems with excessive hunger, and she struggles with emotional eating.  Depression Screen [name]'s Food and Mood (modified PHQ-9) score was 6.  Subjective:   1. Other fatigue [name] admits to daytime somnolence and admits to waking up still tired. Patient has a history of symptoms  of daytime fatigue and morning fatigue. [name] generally gets 4 or 5 hours of sleep per night, and states that she has nightime awakenings and generally restful sleep. Snoring is present. Apneic episodes are present. Epworth Sleepiness Score is 6.  EKG done on 12/25/2022, normal sinus rhythm at 67 bpm/PACs.  2. SOBOE (shortness of breath on exertion) Sua notes increasing shortness of breath with exercising and seems to be worsening over time with weight gain. She notes getting out of breath sooner with activity than she used to. This has not gotten worse recently. Adiya denies shortness of breath at rest or orthopnea.  3. Type 2 diabetes mellitus with other specified complication, unspecified whether long term insulin use (HCC) Patient's type 2 diabetes mellitus is diet controlled.  She craves more starches and sweets now.  4. OSA on CPAP Patient tolerates CPAP well, and she is getting 4+ hours of sleep with CPAP.  5. Weight gain following gastric bypass surgery Patient still has some volume restriction.  She had fluid removed from her band in 2018.  Her lowest weight in 2016 was 234 pounds.  Her max weight was 455 pounds.  Due to abdominal pain, she did not want more fluid in.  6. Anxiety and depression Patient's bariatric PHQ-9 score is 6.  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  She is not on any mood medications.  Assessment/Plan:   1. Other fatigue [name] does feel that her weight is causing her energy to be lower  than it should be. Fatigue may be related to obesity, depression or many other causes. Labs will be ordered, and in the meanwhile, [name] will focus on self care including making healthy food choices, increasing physical activity and focusing on stress reduction.  - VITAMIN D 25 Hydroxy (Vit-D Deficiency, Fractures) - TSH - T4, free - T3 - Lipid panel - Folate - Comprehensive metabolic panel - Vitamin B12 - CBC with Differential/Platelet -  Ferritin - Iron and TIBC - Prealbumin  2. SOBOE (shortness of breath on exertion) [name] does feel that she gets out of breath more easily that she used to when she exercises. [name]'s shortness of breath appears to be obesity related and exercise induced. She has agreed to work on weight loss and gradually increase exercise to treat her exercise induced shortness of breath. Will continue to monitor closely.  3. Type 2 diabetes mellitus with other specified complication, unspecified whether long term insulin use (HCC) We will check labs today, and we will follow-up at patient's next office visit.  - Insulin, random - Hemoglobin A1c  4. OSA on CPAP Look for improvements with weight loss, and aim for 7 to 8 hours of sleep with CPAP.  5. Weight gain following gastric bypass surgery Patient will continue a multivitamin daily, vitamin D, and B12.  6. Anxiety and depression Consider cognitive behavioral therapy with Dr. Dewaine Conger, our bariatric psychologist.  7. Depression screen [name] had a positive depression screening. Depression is commonly associated with obesity and often results in emotional eating behaviors. We will monitor this closely and work on CBT to help improve the non-hunger eating patterns. Referral to Psychology may be required if no improvement is seen as she continues in our clinic.  8. BMI 50.0-59.9, adult (HCC)  9. Morbid obesity with starting BMI 50.8 [name] is currently in the action stage of change and her goal is to continue with weight loss efforts. I recommend [name] begin the structured treatment plan as follows:  She has agreed to the Category 3 Plan.  100-calorie snack list was given.   Exercise goals: No exercise has been prescribed at this time.   Behavioral modification strategies: increasing lean protein intake.  She was informed of the importance of frequent follow-up visits to maximize her success with intensive lifestyle modifications for her  multiple health conditions. She was informed we would discuss her lab results at her next visit unless there is a critical issue that needs to be addressed sooner. [name] agreed to keep her next visit at the agreed upon time to discuss these results.  Objective:   Blood pressure 129/83, pulse 60, temperature 97.7 F (36.5 C), height 5' 4.5" (1.638 m), weight 300 lb (136.1 kg), last menstrual period 06/02/2011, SpO2 99%. Body mass index is 50.7 kg/m.  EKG: Normal sinus rhythm, rate 67 BPM.  Indirect Calorimeter completed today shows a VO2 of 254 and a REE of 1757.  Her calculated basal metabolic rate is 9604 thus her basal metabolic rate is worse than expected.  General: Cooperative, alert, well developed, in no acute distress. HEENT: Conjunctivae and lids unremarkable. Cardiovascular: Regular rhythm.  Lungs: Normal work of breathing. Neurologic: No focal deficits.   Lab Results  Component Value Date   CREATININE 1.13 (H) 01/27/2023   BUN 11 01/27/2023   NA 144 01/27/2023   K 4.5 01/27/2023   CL 108 (H) 01/27/2023   CO2 22 01/27/2023   Lab Results  Component Value Date   ALT 23 01/27/2023   AST  16 01/27/2023   ALKPHOS 94 01/27/2023   BILITOT 0.4 01/27/2023   Lab Results  Component Value Date   HGBA1C 5.5 01/27/2023   HGBA1C 5.5 11/04/2017   HGBA1C 5.7 (H) 01/25/2013   HGBA1C (H) 08/09/2007    10.9 (NOTE)   The ADA recommends the following therapeutic goals for glycemic   control related to Hgb A1C measurement:   Goal of Therapy:   < 7.0% Hgb A1C   Action Suggested:  > 8.0% Hgb A1C   Ref:  Diabetes Care, 22, Suppl. 1, 1999   Lab Results  Component Value Date   INSULIN 25.1 (H) 01/27/2023   INSULIN 15.2 11/04/2017   Lab Results  Component Value Date   TSH 2.960 01/27/2023   Lab Results  Component Value Date   CHOL 169 01/27/2023   HDL 55 01/27/2023   LDLCALC 99 01/27/2023   TRIG 79 01/27/2023   CHOLHDL 3.1 01/27/2023   Lab Results  Component Value Date   WBC  5.3 01/27/2023   HGB 13.8 01/27/2023   HCT 43.3 01/27/2023   MCV 86 01/27/2023   PLT 149 (L) 01/27/2023   Lab Results  Component Value Date   IRON 91 01/27/2023   TIBC 301 01/27/2023   FERRITIN 30 01/27/2023   Attestation Statements:   Reviewed by clinician on day of visit: allergies, medications, problem list, medical history, surgical history, family history, social history, and previous encounter notes.  I have personally spent 40 minutes total time today in preparation, patient care, nutritional counseling and documentation for this visit, including the following: review of clinical lab tests; review of medical tests/procedures/services.   Trude Mcburney, am acting as transcriptionist for Seymour Bars, DO.  I have reviewed the above documentation for accuracy and completeness, and I agree with the above. - Tykel Badie, DO

## 2023-02-05 ENCOUNTER — Ambulatory Visit: Payer: BC Managed Care – PPO | Admitting: Allergy

## 2023-02-08 ENCOUNTER — Ambulatory Visit: Payer: BC Managed Care – PPO | Admitting: Family Medicine

## 2023-02-08 ENCOUNTER — Encounter: Payer: Self-pay | Admitting: Family Medicine

## 2023-02-08 VITALS — BP 141/83 | HR 95 | Temp 97.6°F | Ht 64.5 in | Wt 306.0 lb

## 2023-02-08 DIAGNOSIS — E66813 Obesity, class 3: Secondary | ICD-10-CM

## 2023-02-08 DIAGNOSIS — Z9884 Bariatric surgery status: Secondary | ICD-10-CM

## 2023-02-08 DIAGNOSIS — I10 Essential (primary) hypertension: Secondary | ICD-10-CM | POA: Diagnosis not present

## 2023-02-08 DIAGNOSIS — Z6841 Body Mass Index (BMI) 40.0 and over, adult: Secondary | ICD-10-CM

## 2023-02-08 DIAGNOSIS — E119 Type 2 diabetes mellitus without complications: Secondary | ICD-10-CM | POA: Diagnosis not present

## 2023-02-08 DIAGNOSIS — Z7985 Long-term (current) use of injectable non-insulin antidiabetic drugs: Secondary | ICD-10-CM

## 2023-02-08 MED ORDER — SEMAGLUTIDE(0.25 OR 0.5MG/DOS) 2 MG/3ML ~~LOC~~ SOPN
0.2500 mg | PEN_INJECTOR | SUBCUTANEOUS | 0 refills | Status: DC
Start: 2023-02-08 — End: 2023-03-03

## 2023-02-08 NOTE — Assessment & Plan Note (Signed)
Patient has struggled with postop regain status post band over bypass done in 2015 with Dr. [name] at [facility].  Her original gastric bypass was in 2010.  She has some improvements with portion control at mealtime but has struggled with poor food choices, grazing on snack foods and emotional eating.  We have discussed the importance of getting in lean protein and fiber with meals, reducing added sugar and refined carbohydrates working on stress reduction and mindful eating.  Consider the addition of cognitive behavioral therapy with Dr. Dewaine Conger.  Will look for improvements in food noise with the addition of Ozempic 0.25 mg once weekly injection

## 2023-02-08 NOTE — Assessment & Plan Note (Signed)
Patient has a history of type 2 diabetes, diagnosed prior to her gastric bypass surgery.  She has been off medication and has been controlled with dietary changes.  Reviewed her lab results from last visit.  She has recently started on her prescribed meal plan with limited physical activity.  We have discussed the option to add Ozempic given her history of type 2 diabetes and CVA history.  Patient denies a personal or family history of pancreatitis, medullary thyroid carcinoma or multiple endocrine neoplasia type II. Begin Ozempic 0.25 mg once weekly injection.  Reviewed [email or web address] to review pen training video Reviewed mechanism of action and potential adverse side effects Will use Ozempic in combination with a reduced calorie high-protein low carbohydrate low sugar diet with a plan for increased physical activity

## 2023-02-08 NOTE — Patient Instructions (Addendum)
Stay on 2 Centrum Women's Multivitamins daily  You should be on vitamin B12 500 mcg daily Move RX vitamin D to every other week  Check out [email or web address] for pen training video Start with 0.25 mg once weekly injection

## 2023-02-08 NOTE — Assessment & Plan Note (Signed)
Blood pressure is elevated today.  She reports daily use of Diovan HCT 80/12.5 mg daily, diltiazem 240 mg CD once daily at bedtime.  She denies headache or chest pain.  She is managed by her PCP.  Continue use of current antihypertensive medications looking for improvements with weight reduction.

## 2023-02-08 NOTE — Progress Notes (Signed)
Office: [phone]  /  Fax: [phone]  WEIGHT SUMMARY AND BIOMETRICS  Starting Date: 01/27/23  Starting Weight: 300lb   Weight Lost Since Last Visit: 0lb   Vitals Temp: 97.6 F (36.4 C) BP: (!) 141/83 Pulse Rate: 95 SpO2: 99 %   Body Composition  Body Fat %: 61.5 % Fat Mass (lbs): 188.2 lbs Muscle Mass (lbs): 111.8 lbs Visceral Fat Rating : 25     HPI  Chief Complaint: OBESITY  [name] is here to discuss her progress with her obesity treatment plan. She is on the the Category 3 Plan and states she is following her eating plan approximately 60 % of the time. She states she is exercising 10 minutes 3 times per week.   Interval History:  Since last office visit she is up 6 lb She has stuck to her meal plan most days She is getting in all 3 meals She is prioritizing protein with meals She was still feeling hungry in the middle of the night and this is improving She has been walking more and has been increasing water intake She is cooking at home more She is struggling some with stress eating She had more stress this week and has been socializing more celebrating [employer] homecoming  Pharmacotherapy: none  PHYSICAL EXAM:  Blood pressure (!) 141/83, pulse 95, temperature 97.6 F (36.4 C), height 5' 4.5" (1.638 m), weight (!) 306 lb (138.8 kg), last menstrual period 06/02/2011, SpO2 99%. Body mass index is 51.71 kg/m.  General: She is overweight, cooperative, alert, well developed, and in no acute distress. PSYCH: Has normal mood, affect and thought process.   Lungs: Normal breathing effort, no conversational dyspnea.   ASSESSMENT AND PLAN  TREATMENT PLAN FOR OBESITY:  Recommended Dietary Goals  [name] is currently in the action stage of change. As such, her goal is to continue weight management plan. She has agreed to the Category 3 Plan.  Behavioral Intervention  We discussed the following Behavioral Modification Strategies today: increasing lean  protein intake to established goals, decreasing simple carbohydrates , increasing vegetables, increasing lower glycemic fruits, increasing fiber rich foods, avoiding skipping meals, increasing water intake , work on meal planning and preparation, keeping healthy foods at home, continue to work on implementation of reduced calorie nutritional plan, planning for success, and continue to work on maintaining a reduced calorie state, getting the recommended amount of protein, incorporating whole foods, making healthy choices, staying well hydrated and practicing mindfulness when eating..  Additional resources provided today: NA  Recommended Physical Activity Goals  [name] has been advised to work up to 150 minutes of moderate intensity aerobic activity a week and strengthening exercises 2-3 times per week for cardiovascular health, weight loss maintenance and preservation of muscle mass.   She has agreed to Think about enjoyable ways to increase daily physical activity and overcoming barriers to exercise and Increase physical activity in their day and reduce sedentary time (increase NEAT).  Pharmacotherapy changes for the treatment of obesity: Begin Ozempic 0.25 mg once weekly injection  ASSOCIATED CONDITIONS ADDRESSED TODAY  Diet-controlled diabetes mellitus (HCC) Assessment & Plan: Patient has a history of type 2 diabetes, diagnosed prior to her gastric bypass surgery.  She has been off medication and has been controlled with dietary changes.  Reviewed her lab results from last visit.  She has recently started on her prescribed meal plan with limited physical activity.  We have discussed the option to add Ozempic given her history of type 2 diabetes and CVA  history.  Patient denies a personal or family history of pancreatitis, medullary thyroid carcinoma or multiple endocrine neoplasia type II. Begin Ozempic 0.25 mg once weekly injection.  Reviewed [email or web address] to review pen training video Reviewed  mechanism of action and potential adverse side effects Will use Ozempic in combination with a reduced calorie high-protein low carbohydrate low sugar diet with a plan for increased physical activity  Orders: -     Semaglutide(0.25 or 0.5MG /DOS); Inject 0.25 mg into the skin once a week.  Dispense: 3 mL; Refill: 0  Class 3 severe obesity due to excess calories with body mass index (BMI) of 50.0 to 59.9 in adult, unspecified whether serious comorbidity present (HCC)  Weight gain following gastric bypass surgery Assessment & Plan: Patient has struggled with postop regain status post band over bypass done in 2015 with Dr. [name] at [facility].  Her original gastric bypass was in 2010.  She has some improvements with portion control at mealtime but has struggled with poor food choices, grazing on snack foods and emotional eating.  We have discussed the importance of getting in lean protein and fiber with meals, reducing added sugar and refined carbohydrates working on stress reduction and mindful eating.  Consider the addition of cognitive behavioral therapy with Dr. Dewaine [name].  Will look for improvements in food noise with the addition of Ozempic 0.25 mg once weekly injection   HYPERTENSION Assessment & Plan: Blood pressure is elevated today.  She reports daily use of Diovan HCT 80/12.5 mg daily, diltiazem 240 mg CD once daily at bedtime.  She denies headache or chest pain.  She is managed by her PCP.  Continue use of current antihypertensive medications looking for improvements with weight reduction.       She was informed of the importance of frequent follow up visits to maximize her success with intensive lifestyle modifications for her multiple health conditions.   ATTESTASTION STATEMENTS:  Reviewed by clinician on day of visit: allergies, medications, problem list, medical history, surgical history, family history, social history, and previous encounter notes pertinent to obesity diagnosis.    I have personally spent 30 minutes total time today in preparation, patient care, nutritional counseling and documentation for this visit, including the following: review of clinical lab tests; review of medical tests/procedures/services.      Glennis Brink, DO DABFM, DABOM [facility] [address] [place], [place] [address] [phone]

## 2023-02-09 ENCOUNTER — Telehealth (INDEPENDENT_AMBULATORY_CARE_PROVIDER_SITE_OTHER): Payer: Self-pay

## 2023-02-09 NOTE — Telephone Encounter (Signed)
Prior Auth submitted for Ozempic via cover my meds.  Awaiting determination.

## 2023-02-10 NOTE — Telephone Encounter (Signed)
Prior authorization approved for her Ozempic.  As long as you remain covered by the [facility] and there are no changes to your plan benefits, this request is approved for the following time period: 02/09/2023 - 02/08/2026

## 2023-02-24 ENCOUNTER — Other Ambulatory Visit: Payer: Self-pay | Admitting: Allergy

## 2023-02-25 ENCOUNTER — Other Ambulatory Visit: Payer: Self-pay

## 2023-02-25 MED ORDER — OMEPRAZOLE 20 MG PO CPDR: 20.0000 mg | DELAYED_RELEASE_CAPSULE | Freq: Every day | ORAL | 0 refills | Status: DC

## 2023-03-03 ENCOUNTER — Encounter: Payer: Self-pay | Admitting: Family Medicine

## 2023-03-03 ENCOUNTER — Encounter: Payer: Self-pay | Admitting: Allergy

## 2023-03-03 ENCOUNTER — Other Ambulatory Visit: Payer: Self-pay

## 2023-03-03 ENCOUNTER — Ambulatory Visit: Payer: BC Managed Care – PPO | Admitting: Family Medicine

## 2023-03-03 ENCOUNTER — Ambulatory Visit (INDEPENDENT_AMBULATORY_CARE_PROVIDER_SITE_OTHER): Payer: BC Managed Care – PPO | Admitting: Allergy

## 2023-03-03 VITALS — BP 135/84 | HR 61 | Temp 98.0°F | Ht 64.5 in | Wt 294.0 lb

## 2023-03-03 VITALS — BP 120/72 | HR 58 | Temp 97.7°F | Resp 12 | Wt 298.7 lb

## 2023-03-03 DIAGNOSIS — Z9884 Bariatric surgery status: Secondary | ICD-10-CM

## 2023-03-03 DIAGNOSIS — J4540 Moderate persistent asthma, uncomplicated: Secondary | ICD-10-CM

## 2023-03-03 DIAGNOSIS — F419 Anxiety disorder, unspecified: Secondary | ICD-10-CM | POA: Diagnosis not present

## 2023-03-03 DIAGNOSIS — F32A Depression, unspecified: Secondary | ICD-10-CM | POA: Diagnosis not present

## 2023-03-03 DIAGNOSIS — L309 Dermatitis, unspecified: Secondary | ICD-10-CM

## 2023-03-03 DIAGNOSIS — R635 Abnormal weight gain: Secondary | ICD-10-CM

## 2023-03-03 DIAGNOSIS — H1013 Acute atopic conjunctivitis, bilateral: Secondary | ICD-10-CM

## 2023-03-03 DIAGNOSIS — E119 Type 2 diabetes mellitus without complications: Secondary | ICD-10-CM

## 2023-03-03 DIAGNOSIS — K219 Gastro-esophageal reflux disease without esophagitis: Secondary | ICD-10-CM

## 2023-03-03 DIAGNOSIS — J3089 Other allergic rhinitis: Secondary | ICD-10-CM

## 2023-03-03 DIAGNOSIS — Z6841 Body Mass Index (BMI) 40.0 and over, adult: Secondary | ICD-10-CM

## 2023-03-03 DIAGNOSIS — E66813 Obesity, class 3: Secondary | ICD-10-CM

## 2023-03-03 MED ORDER — TRELEGY ELLIPTA 200-62.5-25 MCG/ACT IN AEPB: 1.0000 | INHALATION_SPRAY | Freq: Every day | RESPIRATORY_TRACT | 5 refills | Status: DC

## 2023-03-03 MED ORDER — MOMETASONE FUROATE 50 MCG/ACT NA SUSP: NASAL | 5 refills | Status: DC

## 2023-03-03 MED ORDER — SEMAGLUTIDE(0.25 OR 0.5MG/DOS) 2 MG/3ML ~~LOC~~ SOPN: 0.5000 mg | PEN_INJECTOR | SUBCUTANEOUS | 0 refills | Status: DC

## 2023-03-03 MED ORDER — OLOPATADINE HCL 0.2 % OP SOLN: 1.0000 [drp] | Freq: Every day | OPHTHALMIC | 5 refills | Status: AC | PRN

## 2023-03-03 MED ORDER — MOMETASONE FUROATE 0.1 % EX OINT: TOPICAL_OINTMENT | Freq: Every day | CUTANEOUS | 5 refills | Status: AC | PRN

## 2023-03-03 MED ORDER — MONTELUKAST SODIUM 10 MG PO TABS: 10.0000 mg | ORAL_TABLET | Freq: Every day | ORAL | 5 refills | Status: DC

## 2023-03-03 MED ORDER — AZELASTINE HCL 0.1 % NA SOLN: NASAL | 5 refills | Status: DC

## 2023-03-03 NOTE — Progress Notes (Signed)
Office: [phone]  /  Fax: [phone]  WEIGHT SUMMARY AND BIOMETRICS  Starting Date: 01/27/23  Starting Weight: 300lb   Weight Lost Since Last Visit: 12lb   Vitals Temp: 98 F (36.7 C) BP: 135/84 Pulse Rate: 61 SpO2: 100 %   Body Composition  Body Fat %: 59.8 % Fat Mass (lbs): 176 lbs Muscle Mass (lbs): 112.4 lbs Visceral Fat Rating : 23   HPI  Chief Complaint: OBESITY  [name] is here to discuss her progress with her obesity treatment plan. She is on the the Category 3 Plan and states she is following her eating plan approximately 75 % of the time. She states she is exercising 10-15 minutes 1 times per week.   Interval History:  Since last office visit she is down 12 lb She denies GI SE since starting on Ozempic 0.25 mg 3 weeks ago She denies much added satiety so far She is working on improving food choices She is prioritizing protein- added a Premier Protein shake daily She is getting in fruits and veggies She she has a net weight loss of 6 lb in the past month She is s/p RYBG in 2010 with band over bypass in 2015   Pharmacotherapy: Ozempic 0.25 mg weekly injection  PHYSICAL EXAM:  Blood pressure 135/84, pulse 61, temperature 98 F (36.7 C), height 5' 4.5" (1.638 m), weight 294 lb (133.4 kg), last menstrual period 06/02/2011, SpO2 100%. Body mass index is 49.69 kg/m.  General: She is overweight, cooperative, alert, well developed, and in no acute distress. PSYCH: Has normal mood, affect and thought process.   Lungs: Normal breathing effort, no conversational dyspnea.   ASSESSMENT AND PLAN  TREATMENT PLAN FOR OBESITY:  Recommended Dietary Goals  [name] is currently in the action stage of change. As such, her goal is to continue weight management plan. She has agreed to the Category 3 Plan.  Behavioral Intervention  We discussed the following Behavioral Modification Strategies today: increasing lean protein intake to established goals,  increasing vegetables, avoiding skipping meals, increasing water intake , work on meal planning and preparation, keeping healthy foods at home, identifying sources and decreasing liquid calories, work on managing stress, creating time for self-care and relaxation, planning for success, and continue to work on maintaining a reduced calorie state, getting the recommended amount of protein, incorporating whole foods, making healthy choices, staying well hydrated and practicing mindfulness when eating..  Additional resources provided today: NA  Recommended Physical Activity Goals  [name] has been advised to work up to 150 minutes of moderate intensity aerobic activity a week and strengthening exercises 2-3 times per week for cardiovascular health, weight loss maintenance and preservation of muscle mass.   She has agreed to Think about enjoyable ways to increase daily physical activity and overcoming barriers to exercise and Increase physical activity in their day and reduce sedentary time (increase NEAT).  Pharmacotherapy changes for the treatment of obesity: increase Ozempic to 0.5 mg weekly injection  ASSOCIATED CONDITIONS ADDRESSED TODAY  Diet-controlled diabetes mellitus (HCC) Assessment & Plan: Lab Results  Component Value Date   HGBA1C 5.5 01/27/2023   Doing well on Ozempic 0.25 mg weekly injection x 3 weeks without GI SE.  Given lack of satiety, will increase dose to 0.5 mg following week 4 on 0.25 mg dose.  Doing better on prescribed diet.  Has room to ramp up exercise.    Orders: -     Semaglutide(0.25 or 0.5MG /DOS); Inject 0.5 mg into the skin once a week.  Dispense: 3 mL; Refill: 0  Class 3 severe obesity due to excess calories with serious comorbidity and body mass index (BMI) of 45.0 to 49.9 in adult (HCC)  Weight gain following gastric bypass surgery Assessment & Plan: Improving Working on prescribed meal plan, prioritizing lean protein intake.  We have discussed the  importance of adding in regular exercise.  Annual bariatric labs are up-to-date.  She is seeing a good amount of weight reduction with the introduction of Ozempic for diet-controlled type 2 diabetes.  Continue to work on a reduced calorie high-protein low sugar diet as prescribed with a plan for regular exercise to include 30 to 60 minutes of both cardio and resistance training up to 5 days a week.  Continue Ozempic.   Anxiety and depression Assessment & Plan: Stable.  She has been working on improving sleep time with CPAP at night.  She feels that emotional eating is under better control.  We have discussed adding in CBT with Dr. Dewaine Conger.  She declined today.  Continue working on mindful eating, stress reduction.       She was informed of the importance of frequent follow up visits to maximize her success with intensive lifestyle modifications for her multiple health conditions.   ATTESTASTION STATEMENTS:  Reviewed by clinician on day of visit: allergies, medications, problem list, medical history, surgical history, family history, social history, and previous encounter notes pertinent to obesity diagnosis.   I have personally spent 30 minutes total time today in preparation, patient care, nutritional counseling and documentation for this visit, including the following: review of clinical lab tests; review of medical tests/procedures/services.      Glennis Brink, DO DABFM, DABOM [facility] [address] [place], [place] [address] [phone]

## 2023-03-03 NOTE — Patient Instructions (Addendum)
Asthma - do well - continue Trelegy 1 inhalation once a day.  Rinse mouth after use - continue Singulair 10mg  daily - have access to Xopenex inhaler 2 puffs every 4-6 hours as needed for cough/wheeze/shortness of breath/chest tightness.  May use 15-20 minutes prior to activity.   Monitor frequency of use.   - continue Dupixent injections every 2 weeks self-administered. Alternate your injection sites.   Asthma control goals:  Full participation in all desired activities (may need albuterol before activity) Albuterol use two time or less a week on average (not counting use with activity) Cough interfering with sleep two time or less a month Oral steroids no more than once a year No hospitalizations  Allergies - flaring some with pollen   - continue your allergen avoidance measures  - use levocetirizine 1/2 tab daily  - for itchy/watery eyes can continue use of as needed Olopatadine   - for nasal congestion/stuffiness use of Nasonex or Nasacort 1-2 sprays each nostril daily as needed.  This is nasal steroid spray.  - for nasal drainage/post-nasal drip use Astelin 2 sprays each nostril 2 times a day for symptom control  - use nasal saline rinse twice a day at this time. Use saline rinse kit prior to nasal spray use.  Use distilled water or boil water and bring to room temperature (do not use tap water).   Nosebleeds  - use nasal steroid sprays above sparingly with nasal congestion and consistent use can lead to nosebleeds  - keep nose moisturized as much as possible with nasal saline spray or gel.  Can even use vaseline on qtip and apply thin layer to inner nose to moisturize  Reflux  - reflux symptoms can be triggers of asthma symptoms  - use Omeprazole as needed for reflux control  Itching   - use Opzelura thin layer to areas that itch twice a day if needed.  This is a non-steroid ointment that can be use anywhere on body if needed  - use Elocon ointment daily if needed for itchy  rash.  This is a steroid ointment.   Follow-up 6 months or sooner if needed

## 2023-03-03 NOTE — Assessment & Plan Note (Signed)
Lab Results  Component Value Date   HGBA1C 5.5 01/27/2023   Doing well on Ozempic 0.25 mg weekly injection x 3 weeks without GI SE.  Given lack of satiety, will increase dose to 0.5 mg following week 4 on 0.25 mg dose.  Doing better on prescribed diet.  Has room to ramp up exercise.

## 2023-03-03 NOTE — Assessment & Plan Note (Signed)
Stable.  She has been working on improving sleep time with CPAP at night.  She feels that emotional eating is under better control.  We have discussed adding in CBT with Dr. Dewaine Conger.  She declined today.  Continue working on mindful eating, stress reduction.

## 2023-03-03 NOTE — Assessment & Plan Note (Signed)
Improving Working on prescribed meal plan, prioritizing lean protein intake.  We have discussed the importance of adding in regular exercise.  Annual bariatric labs are up-to-date.  She is seeing a good amount of weight reduction with the introduction of Ozempic for diet-controlled type 2 diabetes.  Continue to work on a reduced calorie high-protein low sugar diet as prescribed with a plan for regular exercise to include 30 to 60 minutes of both cardio and resistance training up to 5 days a week.  Continue Ozempic.

## 2023-03-03 NOTE — Progress Notes (Signed)
Follow-up Note  RE: [name] MRN: [id number] DOB: 1960/05/19 Date of Office Visit: 03/03/2023   History of present illness: [name] is a 62 y.o. female presenting today for follow-up of asthma, allergic rhinitis and reflux.  She was last seen in the office on 07/30/22 by myself.    Discussed the use of AI scribe software for clinical note transcription with the patient, who gave verbal consent to proceed.  In June, she experienced a pinched nerve that resulted in a two-month work absence. The patient describes the pain as originating from the ankle and radiating to the foot, initially misattributed to sciatica. An MRI of the leg and an EKG were performed to rule out circulatory issues, both of which were normal. 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 The patient was treated with steroids, which initially provided relief but the pain returned. Rest was recommended for recovery. The patient also had an episode of diverticulitis since the last visit, which was managed without hospitalization. She was advised to increase protein intake to manage this condition.  The patient also reports starting a weight management program and has been prescribed Ozempic three weeks ago. She has not yet noticed any significant changes.  Her asthma has been well-controlled, with only one minor flare-up requiring the use of a rescue inhaler in the past few months. She continues to use Trelegy daily for asthma management. She has been self-administering Dupixent every two weeks at home for her asthma control, which has significantly helped.  In addition to these issues, the patient reports an increase in nocturnal nosebleeds, despite using a humidifier with her CPAP machine. She has been using a nasal steroid spray as needed, but not daily. The patient also reports persistent itching related mostly of the arms of, which has not been fully relieved  by the use of Opzelura.   Review of systems: 10pt ROS negative unless noted above in HPI   All other systems negative unless noted above in HPI  Past medical/social/surgical/family history have been reviewed and are unchanged unless specifically indicated below.  No changes  Medication List: Current Outpatient Medications  Medication Sig Dispense Refill   acetaminophen (TYLENOL) 500 MG tablet Take 1,000 mg by mouth daily as needed (pain.).     B Complex-Folic Acid (B COMPLEX FORMULA 1 PO) Take 1 drop by mouth in the morning.     bimatoprost (LUMIGAN) 0.01 % SOLN Place 1 drop into both eyes at bedtime.     brinzolamide (AZOPT) 1 % ophthalmic suspension Place 1 drop into both eyes 3 (three) times daily.     Calcium Carbonate-Vit D-Min (CALTRATE 600+D PLUS MINERALS PO) Take 2 tablets by mouth 3 (three) times daily as needed (bone health).     Cholecalciferol (VITAMIN D3) 1.25 MG (50000 UT) CAPS Take 1 capsule by mouth once a week.     COMBIGAN 0.2-0.5 % ophthalmic solution Place 1 drop into both eyes 3 (three) times daily.     Cyanocobalamin (VITAMIN B-12) 2500 MCG SUBL Place 5,000 mcg under the tongue at bedtime.     diltiazem (CARDIZEM CD) 240 MG 24 hr capsule Take 240 mg by mouth at bedtime.     Docosahexaenoic Acid (DHA PO) Take 2 tablets by mouth at bedtime. DHA [employer]     dupilumab (DUPIXENT) 300 MG/2ML prefilled syringe INJECT 1 SYRINGE UNDER THE SKIN EVERY OTHER WEEK 4 mL 11   fexofenadine (ALLEGRA) 180  MG tablet Take 180 mg by mouth daily as needed for allergies.     furosemide (LASIX) 40 MG tablet Take 40 mg by mouth daily as needed (fluid retention).     gabapentin (NEURONTIN) 300 MG capsule SMARTSIG:1 Capsule(s) By Mouth Every Evening PRN     Latanoprostene Bunod 0.024 % SOLN Place 1 drop into both eyes daily.     levalbuterol (XOPENEX HFA) 45 MCG/ACT inhaler Inhale 2 puffs into the lungs every 4 (four) hours as needed. 15 g 1   levalbuterol (XOPENEX) 1.25 MG/3ML  nebulizer solution Take 1.25 mg by nebulization every 6 (six) hours as needed for wheezing or shortness of breath.     loratadine-pseudoephedrine (ALAVERT D-12 HOUR ALLERGY/CONG) 5-120 MG tablet Take 1 tablet by mouth 2 (two) times daily as needed for allergies.     magnesium chloride (SLOW-MAG) 64 MG TBEC Take 1-2 tablets by mouth See admin instructions. Take 2 tablets by mouth every morning and 1 tablet by mouth with dinner     meloxicam (MOBIC) 7.5 MG tablet Take 7.5 mg by mouth daily as needed for pain.     Multiple Vitamin (MULTIVITAMIN WITH MINERALS) TABS tablet Take 2 tablets by mouth every evening.     omeprazole (PRILOSEC) 20 MG capsule Take 1 capsule (20 mg total) by mouth daily. 30 capsule 0   potassium chloride SA (K-DUR,KLOR-CON) 20 MEQ tablet Take 20-40 mEq by mouth See admin instructions. Take 2 tablets (40 meq) by mouth with breakfast and 1 tablet (20 meq) by mouth with dinner     prednisoLONE acetate (PRED FORTE) 1 % ophthalmic suspension Place 1 drop into the left eye daily as needed (inflammation. (Max 2-3 days/week)).     Propylene Glycol 0.6 % SOLN Place 1 drop into both eyes 3 (three) times daily as needed.     pyridOXINE (VITAMIN B-6) 100 MG tablet Take 200 mg by mouth at bedtime.     quiNINE (QUALAQUIN) 324 MG capsule Take 324 mg by mouth at bedtime.      RHOPRESSA 0.02 % SOLN Place 1 drop into the right eye in the morning.     rivaroxaban (XARELTO) 20 MG TABS tablet Take 20 mg by mouth at bedtime.      Sodium Chloride-Sodium Bicarb (NETI POT SINUS WASH NA) Place 1 application into the nose daily as needed (congestion).     valsartan-hydrochlorothiazide (DIOVAN-HCT) 80-12.5 MG per tablet Take 1 tablet by mouth in the morning. MID MORNING     Vitamin D, Ergocalciferol, (DRISDOL) 50000 UNITS CAPS capsule Take 50,000 Units by mouth every Friday at 6 PM.     zinc sulfate 220 (50 Zn) MG capsule Take 220 mg by mouth in the morning. Mid morning     Ascorbic Acid (VITAMIN C) 1000 MG  tablet Take 1,000 mg by mouth daily as needed (immune support). Emergen C (Patient not taking: Reported on 03/03/2023)     azelastine (ASTELIN) 0.1 % nasal spray USE 2 SPRAYS IN EACH NOSTRIL TWICE DAILY AS NEEDED(NASAL DRAINAGE) AS DIRECTED 30 mL 5   mometasone (ELOCON) 0.1 % ointment Apply topically daily as needed (Itchy rash). 45 g 5   mometasone (NASONEX) 50 MCG/ACT nasal spray USE 2 SPRAYS IN EACH NOSTRIL DAILY 17 g 5   montelukast (SINGULAIR) 10 MG tablet Take 1 tablet (10 mg total) by mouth at bedtime. 30 tablet 5   Olopatadine HCl 0.2 % SOLN Apply 1 drop to eye daily as needed (Itchy, watery eyes). 2.5 mL 5  Semaglutide,0.25 or 0.5MG /DOS, 2 MG/3ML SOPN Inject 0.5 mg into the skin once a week. 3 mL 0   TRELEGY ELLIPTA 200-62.5-25 MCG/ACT AEPB Inhale 1 puff into the lungs daily. 1 each 5   No current facility-administered medications for this visit.     Known medication allergies: Allergies  Allergen Reactions   Hydrocodone-Acetaminophen Itching and Rash   Beta Adrenergic Blockers Other (See Comments)    RED EYES AND CONGESTION   Penicillins Hives, Itching and Swelling   Sulfa Antibiotics Hives    RED EYES/RED CHEST   Thimerosal (Thiomersal) Hives and Itching     Physical examination: Blood pressure 120/72, pulse (!) 58, temperature 97.7 F (36.5 C), temperature source Temporal, resp. rate 12, weight 298 lb 11.2 oz (135.5 kg), last menstrual period 06/02/2011, SpO2 98%.  General: Alert, interactive, in no acute distress. HEENT: PERRLA, TMs pearly gray, turbinates minimally edematous without discharge, post-pharynx non erythematous. Neck: Supple without lymphadenopathy. Lungs: Clear to auscultation without wheezing, rhonchi or rales. {no increased work of breathing. CV: Normal S1, S2 without murmurs. Abdomen: Nondistended, nontender. Skin: Warm and dry, without lesions or rashes. Extremities:  No clubbing, cyanosis or edema. Neuro:   Grossly  intact.  Diagnositics/Labs:  Spirometry: FEV1: 1.63L 80%, FVC: 1.88L 72%, ratio consistent with nonobstructive pattern for age/demographic  Assessment and plan:   Asthma - doing well - continue Trelegy 1 inhalation once a day.  Rinse mouth after use - continue Singulair 10mg  daily - have access to Xopenex inhaler 2 puffs every 4-6 hours as needed for cough/wheeze/shortness of breath/chest tightness.  May use 15-20 minutes prior to activity.   Monitor frequency of use.   - continue Dupixent injections every 2 weeks self-administered. Alternate your injection sites.   Asthma control goals:  Full participation in all desired activities (may need albuterol before activity) Albuterol use two time or less a week on average (not counting use with activity) Cough interfering with sleep two time or less a month Oral steroids no more than once a year No hospitalizations  Allergies - flaring some with pollen   - continue your allergen avoidance measures  - use levocetirizine 1/2 tab daily  - for itchy/watery eyes can continue use of as needed Olopatadine   - for nasal congestion/stuffiness use of Nasonex or Nasacort 1-2 sprays each nostril daily as needed.  This is nasal steroid spray.  - for nasal drainage/post-nasal drip use Astelin 2 sprays each nostril 2 times a day for symptom control  - use nasal saline rinse twice a day at this time. Use saline rinse kit prior to nasal spray use.  Use distilled water or boil water and bring to room temperature (do not use tap water).   Nosebleeds  - use nasal steroid sprays above sparingly with nasal congestion and consistent use can lead to nosebleeds  - keep nose moisturized as much as possible with nasal saline spray or gel.  Can even use vaseline on qtip and apply thin layer to inner nose to moisturize  Reflux  - reflux symptoms can be triggers of asthma symptoms  - use Omeprazole as needed for reflux control  Itching/dermatitis  - use  Opzelura thin layer to areas that itch twice a day if needed.  This is a non-steroid ointment that can be use anywhere on body if needed  - use Elocon ointment daily if needed for itchy rash.  This is a steroid ointment.   Follow-up 6 months or sooner if needed  I appreciate  the opportunity to take part in Kasidi's care. Please do not hesitate to contact me with questions.  Sincerely,   Margo Aye, MD Allergy/Immunology [facility]

## 2023-03-17 ENCOUNTER — Other Ambulatory Visit: Payer: Self-pay | Admitting: Allergy

## 2023-03-31 ENCOUNTER — Ambulatory Visit: Payer: BC Managed Care – PPO | Admitting: Family Medicine

## 2023-04-07 ENCOUNTER — Ambulatory Visit: Payer: BC Managed Care – PPO | Admitting: Family Medicine

## 2023-04-12 ENCOUNTER — Ambulatory Visit (INDEPENDENT_AMBULATORY_CARE_PROVIDER_SITE_OTHER): Payer: BC Managed Care – PPO | Admitting: Family Medicine

## 2023-04-12 ENCOUNTER — Encounter: Payer: Self-pay | Admitting: Family Medicine

## 2023-04-12 VITALS — BP 134/83 | HR 77 | Temp 97.8°F | Ht 64.5 in | Wt 291.0 lb

## 2023-04-12 DIAGNOSIS — E66813 Obesity, class 3: Secondary | ICD-10-CM

## 2023-04-12 DIAGNOSIS — Z9189 Other specified personal risk factors, not elsewhere classified: Secondary | ICD-10-CM | POA: Insufficient documentation

## 2023-04-12 DIAGNOSIS — Z9884 Bariatric surgery status: Secondary | ICD-10-CM | POA: Diagnosis not present

## 2023-04-12 DIAGNOSIS — Z6841 Body Mass Index (BMI) 40.0 and over, adult: Secondary | ICD-10-CM

## 2023-04-12 DIAGNOSIS — Z7289 Other problems related to lifestyle: Secondary | ICD-10-CM | POA: Diagnosis not present

## 2023-04-12 DIAGNOSIS — R635 Abnormal weight gain: Secondary | ICD-10-CM | POA: Diagnosis not present

## 2023-04-12 DIAGNOSIS — Z7985 Long-term (current) use of injectable non-insulin antidiabetic drugs: Secondary | ICD-10-CM

## 2023-04-12 DIAGNOSIS — E119 Type 2 diabetes mellitus without complications: Secondary | ICD-10-CM | POA: Diagnosis not present

## 2023-04-12 MED ORDER — SEMAGLUTIDE(0.25 OR 0.5MG/DOS) 2 MG/3ML ~~LOC~~ SOPN
0.5000 mg | PEN_INJECTOR | SUBCUTANEOUS | 1 refills | Status: DC
Start: 2023-04-12 — End: 2023-06-16

## 2023-04-12 NOTE — Patient Instructions (Addendum)
Try to add in 2 high protein snacks to keep you from getting too hungry: Fresh fruit String cheese CHOMPS meat sticks Greek yogurt < 8 grams of sugar Cottage cheese A protein bar < 8 grams of sugar Premier Protein shake

## 2023-04-12 NOTE — Assessment & Plan Note (Signed)
Lab Results  Component Value Date   HGBA1C 5.5 01/27/2023   She is doing well on Ozempic currently at 0.5 mg once weekly injection with adequate satiety.  Incidentally, her bowel incontinence has improved since starting Ozempic without complaints of constipation.  She has been able to cut back on excess snacking and portion control.  Her food choices are still variable.  She has room for improvement with regular exercise.  Continue Ozempic at 0.5 mg once weekly injection.  Continue working on prescribed dietary plan, avoiding excess sugar.  Firm a plan to begin regular exercise in January

## 2023-04-12 NOTE — Progress Notes (Signed)
Office: [phone]  /  Fax: [phone]  WEIGHT SUMMARY AND BIOMETRICS  Starting Date: 01/27/23  Starting Weight: 300lb   Weight Lost Since Last Visit: 3lb   Vitals Temp: 97.8 F (36.6 C) BP: 134/83 Pulse Rate: 77 SpO2: 99 %   Body Composition  Body Fat %: 58.2 % Fat Mass (lbs): 169.8 lbs Muscle Mass (lbs): 115.8 lbs Visceral Fat Rating : 23   HPI  Chief Complaint: OBESITY  [name] is here to discuss her progress with her obesity treatment plan. She is on the the Category 3 Plan and states she is following her eating plan approximately 50 % of the time. She states she is exercising 0 minutes 0 times per week.  Interval History:  Since last office visit she is down 3 lb Muscle mass is up 3.4 pounds and body fat is down 6.2 pounds since last visit This gives her a net weight loss of 9 lb in 2 mos of medically supervised weight management Bowel incontinence has improved She did go up on Ozempic to 0.5 mg weekly and has had improvements in satiety She denies nausea, heartburn or constipation She plans to increase her water intake She has not been doing any exercise, complaining of several days of left great toe pain with no previous diagnosis of gout  Pharmacotherapy: Ozempic 0.5 mg weekly  PHYSICAL EXAM:  Blood pressure 134/83, pulse 77, temperature 97.8 F (36.6 C), height 5' 4.5" (1.638 m), weight 291 lb (132 kg), last menstrual period 06/02/2011, SpO2 99%. Body mass index is 49.18 kg/m.  General: She is overweight, cooperative, alert, well developed, and in no acute distress. PSYCH: Has normal mood, affect and thought process.   Lungs: Normal breathing effort, no conversational dyspnea.   ASSESSMENT AND PLAN  TREATMENT PLAN FOR OBESITY:  Recommended Dietary Goals  [name] is currently in the action stage of change. As such, her goal is to continue weight management plan. She has agreed to the Category 3 Plan.  Behavioral Intervention  We  discussed the following Behavioral Modification Strategies today: increasing lean protein intake to established goals, decreasing simple carbohydrates , increasing vegetables, increasing water intake , work on meal planning and preparation, keeping healthy foods at home, identifying sources and decreasing liquid calories, practice mindfulness eating and understand the difference between hunger signals and cravings, continue to practice mindfulness when eating, planning for success, and continue to work on maintaining a reduced calorie state, getting the recommended amount of protein, incorporating whole foods, making healthy choices, staying well hydrated and practicing mindfulness when eating..  Additional resources provided today: NA  Recommended Physical Activity Goals  Ange has been advised to work up to 150 minutes of moderate intensity aerobic activity a week and strengthening exercises 2-3 times per week for cardiovascular health, weight loss maintenance and preservation of muscle mass.   She has agreed to Work on scheduling and tracking physical activity.  We discussed the importance of adding in consistent exercise.  She is currently not doing anything.  She remains on the contemplative phase of behavior change  Pharmacotherapy changes for the treatment of obesity: None  ASSOCIATED CONDITIONS ADDRESSED TODAY  Weight gain following gastric bypass surgery Assessment & Plan: Starting to improve She is status post band over bypass surgery in 2015 with Dr. [name]. She has improved satiety with smaller portion sizes at mealtime since starting Ozempic She has been taking a women's multivitamin once daily and annual bariatric labs are up-to-date She has yet to start any regular  exercise She has struggled with compliance on a post bariatric surgery diet  Encourage her to stick to her category 3 meal plan heavily relying on lean protein and fiber with meals, reading labels for added sugar.   From a plan to start some sort of regular exercise in January   Diet-controlled diabetes mellitus [facility]) Assessment & Plan: Lab Results  Component Value Date   HGBA1C 5.5 01/27/2023   She is doing well on Ozempic currently at 0.5 mg once weekly injection with adequate satiety.  Incidentally, her bowel incontinence has improved since starting Ozempic without complaints of constipation.  She has been able to cut back on excess snacking and portion control.  Her food choices are still variable.  She has room for improvement with regular exercise.  Continue Ozempic at 0.5 mg once weekly injection.  Continue working on prescribed dietary plan, avoiding excess sugar.  Firm a plan to begin regular exercise in January  Orders: -     Semaglutide(0.25 or 0.5MG /DOS); Inject 0.5 mg into the skin once a week.  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  We discussed options today.  She is not quite ready to start with complaints of fatigue, toe pain.  Encouraged her to talk to her PCP about these issues which are currently a barrier to her progress.  She would likely do best with accountability of a personal trainer or starting with physical therapy       She was informed of the importance of frequent follow up visits to maximize her success with intensive lifestyle modifications for her multiple health conditions.   ATTESTASTION STATEMENTS:  Reviewed by clinician on day of visit: allergies, medications, problem list, medical history, surgical history, family history, social history, and previous encounter notes pertinent to obesity diagnosis.   I have personally spent 30 minutes total time today in preparation, patient care, nutritional counseling and documentation for  this visit, including the following: review of clinical lab tests; review of medical tests/procedures/services.      Glennis Brink, DO DABFM, DABOM [facility] [address] [place], [place] [address] [phone]

## 2023-04-12 NOTE — Assessment & Plan Note (Signed)
Starting to improve She is status post band over bypass surgery in 2015 with Dr. [name]. She has improved satiety with smaller portion sizes at mealtime since starting Ozempic She has been taking a women's multivitamin once daily and annual bariatric labs are up-to-date She has yet to start any regular exercise She has struggled with compliance on a post bariatric surgery diet  Encourage her to stick to her category 3 meal plan heavily relying on lean protein and fiber with meals, reading labels for added sugar.  From a plan to start some sort of regular exercise in January

## 2023-04-12 NOTE — Assessment & Plan Note (Signed)
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  We discussed options today.  She is not quite ready to start with complaints of fatigue, toe pain.  Encouraged her to talk to her PCP about these issues which are currently a barrier to her progress.  She would likely do best with accountability of a personal trainer or starting with physical therapy

## 2023-04-20 ENCOUNTER — Other Ambulatory Visit (HOSPITAL_COMMUNITY): Payer: Self-pay | Admitting: Gastroenterology

## 2023-04-20 DIAGNOSIS — R1011 Right upper quadrant pain: Secondary | ICD-10-CM

## 2023-04-29 ENCOUNTER — Other Ambulatory Visit: Payer: Self-pay | Admitting: Allergy

## 2023-04-29 ENCOUNTER — Encounter (HOSPITAL_COMMUNITY)
Admission: RE | Admit: 2023-04-29 | Discharge: 2023-04-29 | Disposition: A | Discharge status: Home / Self Care | Payer: 59 | Source: Ambulatory Visit | Attending: Gastroenterology | Admitting: Gastroenterology

## 2023-04-29 ENCOUNTER — Encounter (HOSPITAL_COMMUNITY): Payer: Self-pay

## 2023-04-29 ENCOUNTER — Ambulatory Visit (HOSPITAL_COMMUNITY)
Admission: RE | Admit: 2023-04-29 | Discharge: 2023-04-29 | Disposition: A | Discharge status: Home / Self Care | Payer: 59 | Source: Ambulatory Visit | Attending: Gastroenterology | Admitting: Gastroenterology

## 2023-04-29 DIAGNOSIS — R1011 Right upper quadrant pain: Secondary | ICD-10-CM

## 2023-05-17 ENCOUNTER — Ambulatory Visit: Payer: 59 | Admitting: Nurse Practitioner

## 2023-05-17 ENCOUNTER — Other Ambulatory Visit: Payer: Self-pay | Admitting: Surgery

## 2023-05-17 ENCOUNTER — Encounter: Payer: Self-pay | Admitting: Nurse Practitioner

## 2023-05-17 VITALS — BP 128/79 | HR 81 | Temp 97.9°F | Ht 64.5 in | Wt 284.0 lb

## 2023-05-17 DIAGNOSIS — Z7985 Long-term (current) use of injectable non-insulin antidiabetic drugs: Secondary | ICD-10-CM

## 2023-05-17 DIAGNOSIS — Z6841 Body Mass Index (BMI) 40.0 and over, adult: Secondary | ICD-10-CM

## 2023-05-17 DIAGNOSIS — K760 Fatty (change of) liver, not elsewhere classified: Secondary | ICD-10-CM | POA: Diagnosis not present

## 2023-05-17 DIAGNOSIS — E66813 Obesity, class 3: Secondary | ICD-10-CM | POA: Diagnosis not present

## 2023-05-17 DIAGNOSIS — E119 Type 2 diabetes mellitus without complications: Secondary | ICD-10-CM | POA: Diagnosis not present

## 2023-05-17 NOTE — Progress Notes (Signed)
Office: [phone]  /  Fax: [phone]  WEIGHT SUMMARY AND BIOMETRICS  Weight Lost Since Last Visit: 7lb  Weight Gained Since Last Visit: 0lb   Vitals Temp: 97.9 F (36.6 C) BP: 128/79 Pulse Rate: 81 SpO2: 97 %   Anthropometric Measurements Height: 5' 4.5" (1.638 m) Weight: 284 lb (128.8 kg) BMI (Calculated): 48.01 Weight at Last Visit: 291lb Weight Lost Since Last Visit: 7lb Weight Gained Since Last Visit: 0lb Starting Weight: 300lb Total Weight Loss (lbs): 16 lb (7.258 kg)   Body Composition  Body Fat %: 57.6 % Fat Mass (lbs): 163.6 lbs Muscle Mass (lbs): 114.4 lbs Visceral Fat Rating : 22   Other Clinical Data Fasting: Yes Labs: No Today's Visit #: 5 Starting Date: 01/27/23     HPI  Chief Complaint: OBESITY  [name] is here to discuss her progress with her obesity treatment plan. She is on the the Category 3 Plan and states she is following her eating plan approximately 70 % of the time. She states she is exercising 0 minutes 0 days per week.   Interval History:  Since last office visit she has lost 7 pounds.  Struggles polyphagia and cravings.  Notes she is eating 1-3 meals and 2 snacks per day.  She snacks on 2-3 fruits per day-grapes, apple, oranges.  She is drinking water and 1 soda daily.  She is not exercising and stopped going to PT 2 months ago.  She is struggling with back pain. She is walking with the aid of walker over the past week.  She has been seeing ortho.   She was recently treated with a steroid dose pack and is going to start another today.  She is not exercising due to back pain.    She is seeing surgery today due to gallstones.     Pharmacotherapy for weight loss: She is not currently taking medications  for medical weight loss.      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 [name].  Her highest weight prior to surgery was 455 pounds and her nadir weight after surgery was 240s.  She  is taking Multivitamin, Vit B12 every other day and Vit D every 2 weeks.  She reports no restriction.    Pharmacotherapy for DMT2:  She is currently taking Ozempic 0.5mg .  Denies side effects.   Last A1c was 5.5 She is not checking BS at home.   Episodes of hypoglycemia: no On ARB. Taking Xarelto for history of DVT/PE.  Denies history of CVA.   Last eye exam:  Dec 2024    Lab Results  Component Value Date   HGBA1C 5.5 01/27/2023   HGBA1C 5.5 11/04/2017   HGBA1C 5.7 (H) 01/25/2013   Lab Results  Component Value Date   LDLCALC 99 01/27/2023   CREATININE 1.13 (H) 01/27/2023     Fatty liver Confirmed by last [name] on 04/29/23  PHYSICAL EXAM:  Blood pressure 128/79, pulse 81, temperature 97.9 F (36.6 C), height 5' 4.5" (1.638 m), weight 284 lb (128.8 kg), last menstrual period 06/02/2011, SpO2 97%. Body mass index is 48 kg/m.  General: She is overweight, cooperative, alert, well developed, and in no acute distress. PSYCH: Has normal mood, affect and thought process.   Extremities: No edema.  Neurologic: No gross sensory or motor deficits. No tremors or fasciculations noted.    DIAGNOSTIC DATA REVIEWED:  BMET    Component Value Date/Time   NA 144 01/27/2023  1041   K 4.5 01/27/2023 1041   CL 108 (H) 01/27/2023 1041   CO2 22 01/27/2023 1041   GLUCOSE 88 01/27/2023 1041   GLUCOSE 159 (H) 03/23/2014 0435   BUN 11 01/27/2023 1041   CREATININE 1.13 (H) 01/27/2023 1041   CALCIUM 9.3 01/27/2023 1041   GFRNONAA 54 (L) 11/04/2017 0943   GFRAA 62 11/04/2017 0943   Lab Results  Component Value Date   HGBA1C 5.5 01/27/2023   HGBA1C (H) 08/09/2007    10.9 (NOTE)   The ADA recommends the following therapeutic goals for glycemic   control related to Hgb A1C measurement:   Goal of Therapy:   < 7.0% Hgb A1C   Action Suggested:  > 8.0% Hgb A1C   Ref:  Diabetes Care, 22, Suppl. 1, 1999   Lab Results  Component Value Date   INSULIN 25.1 (H) 01/27/2023   INSULIN 15.2 11/04/2017    Lab Results  Component Value Date   TSH 2.960 01/27/2023   CBC    Component Value Date/Time   WBC 5.3 01/27/2023 1041   WBC 5.1 01/03/2020 1021   RBC 5.05 01/27/2023 1041   RBC 4.63 01/03/2020 1021   HGB 13.8 01/27/2023 1041   HGB 13.1 12/20/2007 1044   HCT 43.3 01/27/2023 1041   HCT 39.1 12/20/2007 1044   PLT 149 (L) 01/27/2023 1041   MCV 86 01/27/2023 1041   MCV 84.3 12/20/2007 1044   MCH 27.3 01/27/2023 1041   MCH 26.8 03/25/2014 0446   MCHC 31.9 01/27/2023 1041   MCHC 32.7 01/03/2020 1021   RDW 14.1 01/27/2023 1041   RDW 15.6 (H) 12/20/2007 1044   Iron Studies    Component Value Date/Time   IRON 91 01/27/2023 1041   TIBC 301 01/27/2023 1041   FERRITIN 30 01/27/2023 1041   IRONPCTSAT 30 01/27/2023 1041   Lipid Panel     Component Value Date/Time   CHOL 169 01/27/2023 1041   TRIG 79 01/27/2023 1041   HDL 55 01/27/2023 1041   CHOLHDL 3.1 01/27/2023 1041   CHOLHDL 5.9 08/09/2007 0039   VLDL 32 08/09/2007 0039   LDLCALC 99 01/27/2023 1041   Hepatic Function Panel     Component Value Date/Time   PROT 6.6 01/27/2023 1041   ALBUMIN 3.8 (L) 01/27/2023 1041   AST 16 01/27/2023 1041   ALT 23 01/27/2023 1041   ALKPHOS 94 01/27/2023 1041   BILITOT 0.4 01/27/2023 1041      Component Value Date/Time   TSH 2.960 01/27/2023 1041   Nutritional Lab Results  Component Value Date   VD25OH 83.8 01/27/2023   VD25OH 51.7 11/04/2017     ASSESSMENT AND PLAN  TREATMENT PLAN FOR OBESITY:  Recommended Dietary Goals  Bernardina is currently in the action stage of change. As such, her goal is to continue weight management plan. She has agreed to the Category 3 Plan.  Behavioral Intervention  We discussed the following Behavioral Modification Strategies today: increasing lean protein intake to established goals, decreasing simple carbohydrates , increasing vegetables, increasing fiber rich foods, increasing water intake , work on meal planning and preparation, reading  food labels , keeping healthy foods at home, continue to work on implementation of reduced calorie nutritional plan, continue to practice mindfulness when eating, planning for success, and continue to work on maintaining a reduced calorie state, getting the recommended amount of protein, incorporating whole foods, making healthy choices, staying well hydrated and practicing mindfulness when eating..  Additional resources provided today: NA  Recommended Physical Activity Goals  Birgit has been advised to work up to 150 minutes of moderate intensity aerobic activity a week and strengthening exercises 2-3 times per week for cardiovascular health, weight loss maintenance and preservation of muscle mass.   She has agreed to Think about enjoyable ways to increase daily physical activity and overcoming barriers to exercise, Increase physical activity in their day and reduce sedentary time (increase NEAT)., and Work on scheduling and tracking physical activity.   Recommended going back to PT due to back pain and limited mobility.     ASSOCIATED CONDITIONS ADDRESSED TODAY  Action/Plan  Diet-controlled diabetes mellitus (HCC) Good blood sugar control is important to decrease the likelihood of diabetic complications such as nephropathy, neuropathy, limb loss, blindness, coronary artery disease, and death. Intensive lifestyle modification including diet, exercise and weight loss are the first line of treatment for diabetes.    To keep appt with surgeon today.  Will need to stop Ozempic 2 weeks prior if surgery is scheduled.    Fatty liver Didn't get to address/discuss today due to discussing back pain, gall stones, etc.    To continue working in dietary changes, exercise as tolerated and weight loss.   Class 3 severe obesity due to excess calories with serious comorbidity and body mass index (BMI) of 45.0 to 49.9 in adult [facility])   Recommended going back to PT.    Return in about 4 weeks (around  06/14/2023).Marland Kitchen She was informed of the importance of frequent follow up visits to maximize her success with intensive lifestyle modifications for her multiple health conditions.   ATTESTASTION STATEMENTS:  Reviewed by clinician on day of visit: allergies, medications, problem list, medical history, surgical history, family history, social history, and previous encounter notes.  Judeth Cornfield R. Nusrat Encarnacion FNP-C

## 2023-05-19 ENCOUNTER — Telehealth: Payer: Self-pay | Admitting: *Deleted

## 2023-05-19 NOTE — Telephone Encounter (Signed)
Our office received a fax from Dr. Tedra Sen[place] office of an EKG. Notes on the cover sheet state: Please have Dr. Allyson Sabal to review Ms. Poznanski's EKG and give his medical advice on whether he thinks she will be able to have gallbladder surgery.   I have reviewed notes in the chart from Dr. Abigail Miyamoto who looks to be the surgeon to do the pt's surgery. However, we have not yet received a clearance request from Dr. Magnus Ivan. Not sure if Dr. Magnus Ivan will be requesting preop clearance from cardiology.   If cardiac clearance will be needed, we will need a clearance request from surgeon's office faxed to [phone] attn: preop team  I will update Dr. Tedra [place] office as Lorain Childes.   I will have the EKG sent to our office scanned into the pt's chart as well.

## 2023-05-31 ENCOUNTER — Telehealth: Payer: Self-pay | Admitting: *Deleted

## 2023-05-31 ENCOUNTER — Telehealth: Payer: Self-pay | Admitting: Cardiovascular Disease

## 2023-05-31 NOTE — Telephone Encounter (Signed)
 Pt has been scheduled tele preop appt 06/15/23. Med rec and consent are done.      Patient Consent for Virtual Visit        [name] has provided verbal consent on 05/31/2023 for a virtual visit (video or telephone).   CONSENT FOR VIRTUAL VISIT FOR:  [name]  By participating in this virtual visit I agree to the following:  I hereby voluntarily request, consent and authorize [facility] and its employed or contracted physicians, physician assistants, nurse practitioners or other licensed health care professionals (the Practitioner), to provide me with telemedicine health care services (the "Services") as deemed necessary by the treating Practitioner. I acknowledge and consent to receive the Services by the Practitioner via telemedicine. I understand that the telemedicine visit will involve communicating with the Practitioner through live audiovisual communication technology and the disclosure of certain medical information by electronic transmission. I acknowledge that I have been given the opportunity to request an in-person assessment or other available alternative prior to the telemedicine visit and am voluntarily participating in the telemedicine visit.  I understand that I have the right to withhold or withdraw my consent to the use of telemedicine in the course of my care at any time, without affecting my right to future care or treatment, and that the Practitioner or I may terminate the telemedicine visit at any time. I understand that I have the right to inspect all information obtained and/or recorded in the course of the telemedicine visit and may receive copies of available information for a reasonable fee.  I understand that some of the potential risks of receiving the Services via telemedicine include:  Delay or interruption in medical evaluation due to technological equipment failure or disruption; Information transmitted may not be sufficient (e.g. poor  resolution of images) to allow for appropriate medical decision making by the Practitioner; and/or  In rare instances, security protocols could fail, causing a breach of personal health information.  Furthermore, I acknowledge that it is my responsibility to provide information about my medical history, conditions and care that is complete and accurate to the best of my ability. I acknowledge that Practitioner's advice, recommendations, and/or decision may be based on factors not within their control, such as incomplete or inaccurate data provided by me or distortions of diagnostic images or specimens that may result from electronic transmissions. I understand that the practice of medicine is not an exact science and that Practitioner makes no warranties or guarantees regarding treatment outcomes. I acknowledge that a copy of this consent can be made available to me via my patient portal [facility] MyChart), or I can request a printed copy by calling the office of [facility].    I understand that my insurance will be billed for this visit.   I have read or had this consent read to me. I understand the contents of this consent, which adequately explains the benefits and risks of the Services being provided via telemedicine.  I have been provided ample opportunity to ask questions regarding this consent and the Services and have had my questions answered to my satisfaction. I give my informed consent for the services to be provided through the use of telemedicine in my medical care

## 2023-05-31 NOTE — Telephone Encounter (Signed)
 Left message to call back to schedule tele pre op appt.

## 2023-05-31 NOTE — Telephone Encounter (Signed)
   Pre-operative Risk Assessment    Patient Name: [name]  DOB: 12/18/1960 MRN: [id number]   Date of last office visit: 12-25-2022 Date of next office visit: none   Request for Surgical Clearance    Procedure:   cholecystectomy  Date of Surgery:  Clearance TBD                                Surgeon:  Dr. Oza Blumenthal  Surgeon's Group or Practice Name:  [facility] Phone number:  [phone] Fax number:  [phone]   Type of Clearance Requested:   - Medical  - Pharmacy:  Hold Rivaroxaban  (Xarelto ) Need instructions for holding   Type of Anesthesia:  General    Additional requests/questions:    Tanner Fanny   05/31/2023, 10:18 AM

## 2023-05-31 NOTE — Telephone Encounter (Signed)
 Patient returned call

## 2023-05-31 NOTE — Telephone Encounter (Signed)
 Primary Cardiologist:None   Preoperative team, please contact this patient and set up a phone call appointment for further preoperative risk assessment. Please obtain consent and complete medication review. Thank you for your help.   I confirm that guidance regarding antiplatelet and oral anticoagulation therapy has been completed and, if necessary, noted below (Xarelto  is not managed by cardiology).  I also confirmed the patient resides in the state of [place] . As per [facility] Medical Board telemedicine laws, the patient must reside in the state in which the provider is licensed.   Gerldine Koch, NP-C  05/31/2023, 11:10 AM [address], [address] Office [phone] Fax [phone]

## 2023-05-31 NOTE — Telephone Encounter (Signed)
 Pt has been scheduled tele preop appt 06/15/23. Med rec and consent are done.

## 2023-06-06 ENCOUNTER — Other Ambulatory Visit: Payer: Self-pay | Admitting: Family Medicine

## 2023-06-06 DIAGNOSIS — E119 Type 2 diabetes mellitus without complications: Secondary | ICD-10-CM

## 2023-06-08 ENCOUNTER — Telehealth: Payer: Self-pay | Admitting: Family Medicine

## 2023-06-08 NOTE — Telephone Encounter (Signed)
 Left patient a message to call back regarding her prescription.

## 2023-06-08 NOTE — Telephone Encounter (Signed)
 Pt is calling because pharmacy stated that her RX for ozempic was denied. PT wants to know why? Please call pt at your earliest convince

## 2023-06-14 NOTE — Progress Notes (Signed)
 Virtual Visit via Telephone Note   Because of [name] co-morbid illnesses, she is at least at moderate risk for complications without adequate follow up.  This format is felt to be most appropriate for this patient at this time.  Due to technical limitations with video connection (technology), today's appointment will be conducted as an audio only telehealth visit, and [name] verbally agreed to proceed in this manner.   All issues noted in this document were discussed and addressed.  No physical exam could be performed with this format.  Evaluation Performed:  Preoperative cardiovascular risk assessment _____________   Date:  06/14/2023   Patient ID:  [name], DOB 1960/04/22, MRN [id number] Patient Location:  Home Provider location:   Office  Primary Care Provider:  [name], MD Primary Cardiologist:  None  Chief Complaint / Patient Profile   63 y.o. y/o female with a h/o essential hypertension, PE, GERD, DVT, OSA (on CPAP), hypothyroidism, pulmonary HTN, DM type II who is pending cholecystectomy and presents today for telephonic preoperative cardiovascular risk assessment.  History of Present Illness    [name] is a 63 y.o. female who presents via audio/video conferencing for a telehealth visit today.  Pt was last seen in cardiology clinic on 12/25/2022 by Dr. Allyson Sabal  At that time [name] was doing well and was referred for right foot pain. She had resolution of right foot discomfort with oral prednisone. She had no further ischemic evaluation or indication for peripheral evaluation at that time.  The patient is now pending procedure as outlined above. Since her last visit, she reports doing well with no new cardiac complaints.  She is still having discomfort in her right foot and is planning to have a nerve block completed with assistance of her PCP office.  She recently had an EKG completed that showed sinus bradycardia with no acute changes or  ischemia.  The PR interval of the EKG was less than 200 ms and patient has no new cardiac complaints at this time.  She denies chest pain, shortness of breath, lower extremity edema, fatigue, palpitations, melena, hematuria, hemoptysis, diaphoresis, weakness, presyncope, syncope, orthopnea, and PND.    Past Medical History    Past Medical History:  Diagnosis Date   Anemia    Anxiety    Arthritis    knee    Asthma    Back pain    Depression    Diabetes (HCC)    DVT (deep venous thrombosis) (HCC)    Dysfunctional uterine bleeding    Dyspnea    GERD (gastroesophageal reflux disease)    Glaucoma    Headache(784.0)    MIGRAINES IN PAST   History of stomach ulcers    Hypertension    Hypothyroidism    Joint pain    Lactose intolerance    Leg edema    Morbid obesity (HCC)    Multiple food allergies    Optic neuropathy    Osteoarthritis    PE (pulmonary embolism)    Sleep apnea, obstructive    USES C-PAP   Vitreous hemorrhage of left eye (HCC) 11/13/2019   Past Surgical History:  Procedure Laterality Date   CAPSULOTOMY  05/03/2012   Procedure: MINOR CAPSULOTOMY;  Surgeon: Vita Erm., MD;  Location: [facility] OR;  Service: Ophthalmology;  Laterality: Right;   CAPSULOTOMY  05/05/2012   Procedure: MINOR CAPSULOTOMY;  Surgeon: Vita Erm., MD;  Location: [facility] OR;  Service: Ophthalmology;  Laterality: Right;   CATARACT SURG     COLONOSCOPY WITH PROPOFOL N/A 12/06/2020   Procedure: COLONOSCOPY WITH PROPOFOL;  Surgeon: Jeani Hawking, MD;  Location: [facility] ENDOSCOPY;  Service: Endoscopy;  Laterality: N/A;   DILATION AND CURETTAGE OF UTERUS     ENDOMETRIAL ABLATION     failure    EYE SURGERY  11/16/2009   FOR GLAUCOMA AND LASER SURGERY  X2   GASTRIC BYPASS  [id number]   GLAUCOMA SURGERY  10/31/2009   HERNIA REPAIR     HIATAL HERNIA REPAIR  01/30/2014   Procedure: HERNIA REPAIR HIATAL;  Surgeon: Valarie Merino, MD;  Location: [facility] ORS;  Service: General;;   HYSTEROSCOPY      IVC FILTER     2010 - THEN REMOVED THEN PLACED AGAIN 2012   JOINT REPLACEMENT     KNEE ARTHROSCOPY  12/2007   LAPAROSCOPIC GASTRIC BANDING N/A 01/30/2014   Procedure: LAPAROSCOPIC GASTRIC BANDING over bypass ;  Surgeon: Valarie Merino, MD;  Location: [facility] ORS;  Service: General;  Laterality: N/A;   POLYPECTOMY  12/06/2020   Procedure: POLYPECTOMY;  Surgeon: Jeani Hawking, MD;  Location: [facility];  Service: Endoscopy;;   POSTERIOR LAMINECTOMY / DECOMPRESSION CERVICAL SPINE  01/26/2011   TOTAL KNEE ARTHROPLASTY  09/09/2011   Procedure: TOTAL KNEE ARTHROPLASTY;  Surgeon: Javier Docker, MD;  Location: [facility] ORS;  Service: Orthopedics;  Laterality: Left;   TOTAL KNEE ARTHROPLASTY Right 03/22/2014   Procedure: RIGHT TOTAL KNEE ARTHROPLASTY;  Surgeon: Javier Docker, MD;  Location: [facility] ORS;  Service: Orthopedics;  Laterality: Right;   UPPER GI ENDOSCOPY N/A 01/30/2014   Procedure: UPPER GI ENDOSCOPY;  Surgeon: Valarie Merino, MD;  Location: [facility] ORS;  Service: General;  Laterality: N/A;   YAG LASER APPLICATION  05/03/2012   Procedure: YAG LASER APPLICATION;  Surgeon: Vita Erm., MD;  Location: [facility] OR;  Service: Ophthalmology;  Laterality: Right;   YAG LASER APPLICATION  05/05/2012   Procedure: YAG LASER APPLICATION;  Surgeon: Vita Erm., MD;  Location: [facility] OR;  Service: Ophthalmology;  Laterality: Right;    Allergies  Allergies  Allergen Reactions   Hydrocodone-Acetaminophen Itching and Rash   Beta Adrenergic Blockers Other (See Comments)    RED EYES AND CONGESTION   Penicillins Hives, Itching and Swelling   Sulfa Antibiotics Hives    RED EYES/RED CHEST   Thimerosal (Thiomersal) Hives and Itching    Home Medications    Prior to Admission medications   Medication Sig Start Date End Date Taking? Authorizing Provider  acetaminophen (TYLENOL) 500 MG tablet Take 1,000 mg by mouth daily as needed (pain.).    [provider]  Ascorbic Acid (VITAMIN C) 1000  MG tablet Take 1,000 mg by mouth daily as needed (immune support). Emergen C    [provider]  azelastine (ASTELIN) 0.1 % nasal spray USE 2 SPRAYS IN EACH NOSTRIL TWICE DAILY AS NEEDED(NASAL DRAINAGE) AS DIRECTED 03/03/23   Padgett, Pilar Grammes, MD  B Complex-Folic Acid (B COMPLEX FORMULA 1 PO) Take 1 drop by mouth in the morning.    [provider]  bimatoprost (LUMIGAN) 0.01 % SOLN Place 1 drop into both eyes at bedtime.    [provider]  brinzolamide (AZOPT) 1 % ophthalmic suspension Place 1 drop into both eyes 3 (three) times daily.    [name], MD  Calcium Carbonate-Vit D-Min (CALTRATE 600+D PLUS MINERALS PO) Take 2 tablets by mouth 3 (three) times daily as needed (bone health).  [provider]  Cholecalciferol (VITAMIN D3) 1.25 MG (50000 UT) CAPS Take 1 capsule by mouth once a week. 01/20/23   [provider]  COMBIGAN 0.2-0.5 % ophthalmic solution Place 1 drop into both eyes 3 (three) times daily. 08/24/19   [provider]  Cyanocobalamin (VITAMIN B-12) 2500 MCG SUBL Place 5,000 mcg under the tongue at bedtime.    [provider]  diltiazem (CARDIZEM CD) 240 MG 24 hr capsule Take 240 mg by mouth at bedtime. 11/16/20   [provider]  Docosahexaenoic Acid (DHA PO) Take 2 tablets by mouth at bedtime. DHA Nordic Therapist, art, Historical, MD  dupilumab (DUPIXENT) 300 MG/2ML prefilled syringe INJECT 1 SYRINGE UNDER THE SKIN EVERY 14 DAYS 03/17/23   Marcelyn Bruins, MD  fexofenadine (ALLEGRA) 180 MG tablet Take 180 mg by mouth daily as needed for allergies.    [provider]  furosemide (LASIX) 40 MG tablet Take 40 mg by mouth daily as needed (fluid retention).    [provider]  gabapentin (NEURONTIN) 300 MG capsule 3 (three) times daily. 12/31/22   [provider]  HYDROcodone-acetaminophen (NORCO) 7.5-325 MG tablet Take 1 tablet by mouth every 6 (six) hours as needed.  05/10/23   [provider]  Latanoprostene Bunod 0.024 % SOLN Place 1 drop into both eyes daily. 08/08/21   Diona Foley, MD  levalbuterol [facility] HFA) 45 MCG/ACT inhaler Inhale 2 puffs into the lungs every 4 (four) hours as needed. 07/30/22   Marcelyn Bruins, MD  levalbuterol Pauline Aus) 1.25 MG/3ML nebulizer solution Take 1.25 mg by nebulization every 6 (six) hours as needed for wheezing or shortness of breath.    [provider]  loratadine-pseudoephedrine (ALAVERT D-12 HOUR ALLERGY/CONG) 5-120 MG tablet Take 1 tablet by mouth 2 (two) times daily as needed for allergies.    [provider]  magnesium chloride (SLOW-MAG) 64 MG TBEC Take 1-2 tablets by mouth See admin instructions. Take 2 tablets by mouth every morning and 1 tablet by mouth with dinner    [provider]  meloxicam (MOBIC) 7.5 MG tablet Take 7.5 mg by mouth daily as needed for pain. 11/20/20   [provider]  mometasone (ELOCON) 0.1 % ointment Apply topically daily as needed (Itchy rash). 03/03/23   Marcelyn Bruins, MD  mometasone (NASONEX) 50 MCG/ACT nasal spray USE 2 SPRAYS IN [facility] NOSTRIL DAILY 03/03/23   Marcelyn Bruins, MD  montelukast (SINGULAIR) 10 MG tablet Take 1 tablet (10 mg total) by mouth at bedtime. 03/03/23   Marcelyn Bruins, MD  Multiple Vitamin (MULTIVITAMIN WITH MINERALS) TABS tablet Take 2 tablets by mouth every evening.    [provider]  Olopatadine HCl 0.2 % SOLN Apply 1 drop to eye daily as needed (Itchy, watery eyes). 03/03/23   Marcelyn Bruins, MD  omeprazole (PRILOSEC) 20 MG capsule TAKE 1 CAPSULE(20 MG) BY MOUTH DAILY 04/29/23   Marcelyn Bruins, MD  potassium chloride SA (K-DUR,KLOR-CON) 20 MEQ tablet Take 20-40 mEq by mouth See admin instructions. Take 2 tablets (40 meq) by mouth with breakfast and 1 tablet (20 meq) by mouth with dinner    [provider]  prednisoLONE acetate (PRED FORTE)  1 % ophthalmic suspension Place 1 drop into the left eye daily as needed (inflammation. (Max 2-3 days/week)).    [provider]  Propylene Glycol 0.6 % SOLN Place 1 drop into both eyes 3 (three) times daily as needed.  [provider]  pyridOXINE (VITAMIN B-6) 100 MG tablet Take 200 mg by mouth at bedtime.    [provider]  quiNINE (QUALAQUIN) 324 MG capsule Take 324 mg by mouth at bedtime.     [name], MD  RHOPRESSA 0.02 % SOLN Place 1 drop into the right eye in the morning. 08/24/19   [provider]  rivaroxaban (XARELTO) 20 MG TABS tablet Take 20 mg by mouth at bedtime.     [provider]  Semaglutide,0.25 or 0.5MG /DOS, 2 MG/3ML SOPN Inject 0.5 mg into the skin once a week. 04/12/23   Bowen, Scot Jun, DO  Sodium Chloride-Sodium Bicarb (NETI POT SINUS WASH NA) Place 1 application into the nose daily as needed (congestion).    [provider]  Dwyane Luo 200-62.5-25 MCG/ACT AEPB Inhale 1 puff into the lungs daily. 03/03/23   Marcelyn Bruins, MD  valsartan-hydrochlorothiazide (DIOVAN-HCT) 80-12.5 MG per tablet Take 1 tablet by mouth in the morning. MID MORNING    [provider]  Vitamin D, Ergocalciferol, (DRISDOL) 50000 UNITS CAPS capsule Take 50,000 Units by mouth every Friday at 6 PM. 12/06/12   [provider]  zinc sulfate 220 (50 Zn) MG capsule Take 220 mg by mouth in the morning. Mid morning    [provider]    Physical Exam    Vital Signs:  [name] does not have vital signs available for review today.  Given telephonic nature of communication, physical exam is limited. AAOx3. NAD. Normal affect.  Speech and respirations are unlabored.  Accessory Clinical Findings    None  Assessment & Plan    1.  Preoperative Cardiovascular Risk Assessment: -Patient's RCRI score is 0.9% The patient affirms she has been doing well without any new cardiac symptoms. They are able to  achieve 7 METS without cardiac limitations. Therefore, based on ACC/AHA guidelines, the patient would be at acceptable risk for the planned procedure without further cardiovascular testing. The patient was advised that if she develops new symptoms prior to surgery to contact our office to arrange for a follow-up visit, and she verbalized understanding.   The patient was advised that if she develops new symptoms prior to surgery to contact our office to arrange for a follow-up visit, and she verbalized understanding.  Guidance for holding Ozempic and Xarelto will come from patient's PCP.  A copy of this note will be routed to requesting surgeon.  Time:   Today, I have spent 10 minutes with the patient with telehealth technology discussing medical history, symptoms, and management plan.     Napoleon Form, Leodis Rains, NP  06/14/2023, 7:18 AM

## 2023-06-15 ENCOUNTER — Ambulatory Visit: Payer: 59 | Attending: Cardiology

## 2023-06-15 DIAGNOSIS — Z01810 Encounter for preprocedural cardiovascular examination: Secondary | ICD-10-CM | POA: Diagnosis not present

## 2023-06-16 ENCOUNTER — Encounter: Payer: Self-pay | Admitting: Family Medicine

## 2023-06-16 ENCOUNTER — Ambulatory Visit: Payer: 59 | Admitting: Family Medicine

## 2023-06-16 VITALS — BP 145/83 | HR 98 | Temp 97.6°F | Ht 64.5 in | Wt 281.0 lb

## 2023-06-16 DIAGNOSIS — E66813 Obesity, class 3: Secondary | ICD-10-CM | POA: Diagnosis not present

## 2023-06-16 DIAGNOSIS — I10 Essential (primary) hypertension: Secondary | ICD-10-CM | POA: Diagnosis not present

## 2023-06-16 DIAGNOSIS — E119 Type 2 diabetes mellitus without complications: Secondary | ICD-10-CM | POA: Diagnosis not present

## 2023-06-16 DIAGNOSIS — Z7985 Long-term (current) use of injectable non-insulin antidiabetic drugs: Secondary | ICD-10-CM

## 2023-06-16 DIAGNOSIS — Z9884 Bariatric surgery status: Secondary | ICD-10-CM

## 2023-06-16 DIAGNOSIS — Z6841 Body Mass Index (BMI) 40.0 and over, adult: Secondary | ICD-10-CM

## 2023-06-16 DIAGNOSIS — K8020 Calculus of gallbladder without cholecystitis without obstruction: Secondary | ICD-10-CM | POA: Diagnosis not present

## 2023-06-16 MED ORDER — OZEMPIC (1 MG/DOSE) 4 MG/3ML ~~LOC~~ SOPN: 1.0000 mg | PEN_INJECTOR | SUBCUTANEOUS | 0 refills | Status: DC

## 2023-06-16 NOTE — Progress Notes (Signed)
 Office: [phone]  /  Fax: [phone]  WEIGHT SUMMARY AND BIOMETRICS  Starting Date: 01/27/23  Starting Weight: 300lb   Weight Lost Since Last Visit: 3lb   Vitals Temp: 97.6 F (36.4 C) BP: (!) 145/83 Pulse Rate: 98 SpO2: 99 %   Body Composition  Body Fat %: 58.8 % Fat Mass (lbs): 165.6 lbs Muscle Mass (lbs): 110.2 lbs Visceral Fat Rating : 22    HPI  Chief Complaint: OBESITY  [name] is here to discuss her progress with her obesity treatment plan. She is on the the Category 3 Plan and states she is following her eating plan approximately 50 % of the time. She states she is exercising 0 minutes 0 times per week.  Interval History:  Since last office visit she is down 3 lb She has a net weight loss of 19 lb in the past 4 mos of medically supervised weight management She is tolerating Ozempic at 0.5 mg weekly She has been a bit hungrier lately She has struggled with healthy food choices She is getting ready to have her gall bladder out She denies postprandial abdominal pain or nausea She is dealing with R sided sciatica  This has limited her physical activity and has led to more emotional eating  Pharmacotherapy: Ozempic 0.5 mg weekly  PHYSICAL EXAM:  Blood pressure (!) 145/83, pulse 98, temperature 97.6 F (36.4 C), height 5' 4.5" (1.638 m), weight 281 lb (127.5 kg), last menstrual period 06/02/2011, SpO2 99%. Body mass index is 47.49 kg/m.  General: She is overweight, cooperative, alert, well developed, and in no acute distress. PSYCH: Has normal mood, affect and thought process.   Lungs: Normal breathing effort, no conversational dyspnea. Ambulates with a walker, wearing a medical mask  ASSESSMENT AND PLAN  TREATMENT PLAN FOR OBESITY:  Recommended Dietary Goals  [name] is currently in the action stage of change. As such, her goal is to continue weight management plan. She has agreed to the Category 3 Plan.  Behavioral Intervention  We  discussed the following Behavioral Modification Strategies today: increasing lean protein intake to established goals, increasing fiber rich foods, increasing water intake , work on meal planning and preparation, keeping healthy foods at home, decreasing eating out or consumption of processed foods, and making healthy choices when eating convenient foods, practice mindfulness eating and understand the difference between hunger signals and cravings, work on managing stress, creating time for self-care and relaxation, avoiding temptations and identifying enticing environmental cues, and continue to work on maintaining a reduced calorie state, getting the recommended amount of protein, incorporating whole foods, making healthy choices, staying well hydrated and practicing mindfulness when eating..  Additional resources provided today: NA  Recommended Physical Activity Goals  [name] has been advised to work up to 150 minutes of moderate intensity aerobic activity a week and strengthening exercises 2-3 times per week for cardiovascular health, weight loss maintenance and preservation of muscle mass.   She has agreed to Think about enjoyable ways to increase daily physical activity and overcoming barriers to exercise and Increase physical activity in their day and reduce sedentary time (increase NEAT).  Pharmacotherapy changes for the treatment of obesity: Increase Ozempic to 1 mg once weekly injection  ASSOCIATED CONDITIONS ADDRESSED TODAY  Weight gain following gastric bypass surgery Improving Patient has lost 19 pounds in the past 4 months of medically supervised weight management using a prescribed dietary plan.  She has been fairly inconsistent with sticking to her dietary plan but has been more mindful  of her food choices, portion sizes and has made more of an effort to include lean protein with meals and snacks.  Physical activity remains very limited due to right sided sciatica.  Feeling improved  satiety with use of Ozempic.  Continue to work on a reduced calorie high-protein low carbohydrate post bariatric surgery diet.  Plan to ramp up exercise once her right-sided sciatica has improved.  Chronic pain, mood disorder have been barriers to her progress over time.  Diet-controlled diabetes mellitus (HCC) She has good control of her type 2 diabetes, diet controlled now on Ozempic with improved satiety.  Ozempic has also helped with weight reduction over the past 4 months.  She has tolerated 0.5 mg quite well without adverse side effects.  She is starting to feel more hungry.  Will increase Ozempic to 1 mg once weekly injection with plans to pause for at least 2 weeks prior to her upcoming gallbladder surgery.  Continue to work on limiting intake of starches and sweets -     Ozempic (1 MG/DOSE); Inject 1 mg into the skin once a week.  Dispense: 3 mL; Refill: 0  Class 3 severe obesity due to excess calories with serious comorbidity and body mass index (BMI) of 45.0 to 49.9 in adult [facility])  HYPERTENSION Blood pressure is mildly elevated today but she is in pain with right-sided sciatica, ambulating in here with a walker today Reports good compliance on Diovan HCT 80/12.5 mg once daily  Continue current medication and active plan for weight reduction  Gallstones Reviewed right upper quadrant ultrasound dated 04/29/2023 confirming gallstones present She denies having any current issues with abdominal pain or postprandial nausea or vomiting.  She reports previously having abdominal pain on a daily basis despite what she ate last year.  She is planning to pursue cholecystectomy with Dr. Magnus Ivan in the near future.   She will pause her Ozempic for 2 weeks prior to her surgery date and restart postop  She was informed of the importance of frequent follow up visits to maximize her success with intensive lifestyle modifications for her multiple health conditions.   ATTESTASTION  STATEMENTS:  Reviewed by clinician on day of visit: allergies, medications, problem list, medical history, surgical history, family history, social history, and previous encounter notes pertinent to obesity diagnosis.   I have personally spent 30 minutes total time today in preparation, patient care, nutritional counseling and documentation for this visit, including the following: review of clinical lab tests; review of medical tests/procedures/services.      Glennis Brink, DO DABFM, DABOM [facility] [address] [place], [place] [address] [phone]

## 2023-06-21 ENCOUNTER — Ambulatory Visit: Payer: 59 | Admitting: Podiatry

## 2023-07-08 ENCOUNTER — Other Ambulatory Visit: Payer: Self-pay | Admitting: Family Medicine

## 2023-07-08 DIAGNOSIS — E119 Type 2 diabetes mellitus without complications: Secondary | ICD-10-CM

## 2023-07-12 ENCOUNTER — Encounter: Payer: Self-pay | Admitting: Nurse Practitioner

## 2023-07-12 ENCOUNTER — Ambulatory Visit: Payer: 59 | Admitting: Nurse Practitioner

## 2023-07-12 VITALS — BP 136/82 | HR 85 | Temp 98.0°F | Ht 64.5 in | Wt 275.0 lb

## 2023-07-12 DIAGNOSIS — E119 Type 2 diabetes mellitus without complications: Secondary | ICD-10-CM

## 2023-07-12 DIAGNOSIS — K8020 Calculus of gallbladder without cholecystitis without obstruction: Secondary | ICD-10-CM | POA: Diagnosis not present

## 2023-07-12 DIAGNOSIS — E66813 Obesity, class 3: Secondary | ICD-10-CM | POA: Diagnosis not present

## 2023-07-12 DIAGNOSIS — Z6841 Body Mass Index (BMI) 40.0 and over, adult: Secondary | ICD-10-CM

## 2023-07-12 DIAGNOSIS — Z7985 Long-term (current) use of injectable non-insulin antidiabetic drugs: Secondary | ICD-10-CM

## 2023-07-12 DIAGNOSIS — I10 Essential (primary) hypertension: Secondary | ICD-10-CM

## 2023-07-12 MED ORDER — OZEMPIC (1 MG/DOSE) 4 MG/3ML ~~LOC~~ SOPN: 1.0000 mg | PEN_INJECTOR | SUBCUTANEOUS | 0 refills | Status: DC

## 2023-07-12 NOTE — Progress Notes (Signed)
 Office: [phone]  /  Fax: [phone]  WEIGHT SUMMARY AND BIOMETRICS  Weight Lost Since Last Visit: 6lb  Weight Gained Since Last Visit: 0lb   Vitals Temp: 98 F (36.7 C) BP: 136/82 Pulse Rate: 85 SpO2: 98 %   Anthropometric Measurements Height: 5' 4.5" (1.638 m) Weight: 275 lb (124.7 kg) BMI (Calculated): 46.49 Weight at Last Visit: 281lb Weight Lost Since Last Visit: 6lb Weight Gained Since Last Visit: 0lb Starting Weight: 300lb Total Weight Loss (lbs): 25 lb (11.3 kg)   Body Composition  Body Fat %: 56.3 % Fat Mass (lbs): 155.2 lbs Muscle Mass (lbs): 114.6 lbs Visceral Fat Rating : 21   Other Clinical Data Fasting: Yes Labs: No Today's Visit #: 7 Starting Date: 01/27/23     HPI  Chief Complaint: OBESITY  Brelee is here to discuss her progress with her obesity treatment plan. She is on the the Category 3 Plan and states she is following her eating plan approximately 25 % of the time. She states she is exercising 0 minutes 0 days per week.   Interval History:  Since last office visit she has lost 6 pounds.  She has lost a total of 25 pounds since her initial visit with [name] on 01/27/2023.  She has overall done well with weight loss.  She has been taking Gabapentin for back pain and is tapering off due to cognitive changes.  She is currently taking 100mg  once daily since yesterday.  Notes her memory is better since titrating off gabapentin. She had an injection 2 weeks ago and that has been helpful. Prior to the injection she was struggling with eating and following the meal plan due to pain.  She has been eating out more. At [facility] she eats salads with chicken, at the cafeteria she eats a protein with vegetables, [place] fried chicken with cole slaw or will eat fish and cole slaw.  She is drinking water and zero Ginger ale.  She did a couple weeks  of PT with a friend that is a physical therapist.   She is walking with the aid of a cane  today  Pharmacotherapy for weight loss: She is not currently taking medications  for medical weight loss.       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 [name].  Her highest weight prior to surgery was 455 pounds and her nadir weight after surgery was 240s.  She is taking Multivitamin, Vit B12 every day and Vit D every 2 weeks.  She reports no restriction.    Pharmacotherapy for DMT2:  She is currently taking Ozempic 1mg  (x 4 doses-dose was increased after her last visit on 06-16-2023 with Dr. Cathey Endow).  Denies side effects.   Last A1c was 5.5 on 01/27/23 She is not checking BS at home.   Episodes of hypoglycemia: no On ARB. Taking Xarelto for history of DVT/PE.  Denies history of CVA.   Last eye exam:  Dec 2024  Lab Results  Component Value Date   HGBA1C 5.5 01/27/2023   HGBA1C 5.5 11/04/2017   HGBA1C 5.7 (H) 01/25/2013   Lab Results  Component Value Date   LDLCALC 99 01/27/2023   CREATININE 1.13 (H) 01/27/2023     Hypertension BP looks better today.  Reports that her BP was elevated at her last visit due to pain.  She feels that she is overall doing better concerning her pain at this time. Medication(s): Diovan HCT  80-12.5mg . Denies side effects Denies chest pain, palpitations and SOB.  BP Readings from Last 3 Encounters:  07/12/23 136/82  06/16/23 (!) 145/83  05/17/23 128/79   Lab Results  Component Value Date   CREATININE 1.13 (H) 01/27/2023   CREATININE 1.13 (H) 11/04/2017   CREATININE 0.95 03/23/2014     Gallstones Doesn't have surgery scheduled yet.    PHYSICAL EXAM:  Blood pressure 136/82, pulse 85, temperature 98 F (36.7 C), height 5' 4.5" (1.638 m), weight 275 lb (124.7 kg), last menstrual period 06/02/2011, SpO2 98%. Body mass index is 46.47 kg/m.  General: She is overweight, cooperative, alert, well developed, and in no acute distress. PSYCH: Has normal mood, affect and thought process.   Extremities: No  edema.  Neurologic: No gross sensory or motor deficits. No tremors or fasciculations noted.    DIAGNOSTIC DATA REVIEWED:  BMET    Component Value Date/Time   NA 144 01/27/2023 1041   K 4.5 01/27/2023 1041   CL 108 (H) 01/27/2023 1041   CO2 22 01/27/2023 1041   GLUCOSE 88 01/27/2023 1041   GLUCOSE 159 (H) 03/23/2014 0435   BUN 11 01/27/2023 1041   CREATININE 1.13 (H) 01/27/2023 1041   CALCIUM 9.3 01/27/2023 1041   GFRNONAA 54 (L) 11/04/2017 0943   GFRAA 62 11/04/2017 0943   Lab Results  Component Value Date   HGBA1C 5.5 01/27/2023   HGBA1C (H) 08/09/2007    10.9 (NOTE)   The ADA recommends the following therapeutic goals for glycemic   control related to Hgb A1C measurement:   Goal of Therapy:   < 7.0% Hgb A1C   Action Suggested:  > 8.0% Hgb A1C   Ref:  Diabetes Care, 22, Suppl. 1, 1999   Lab Results  Component Value Date   INSULIN 25.1 (H) 01/27/2023   INSULIN 15.2 11/04/2017   Lab Results  Component Value Date   TSH 2.960 01/27/2023   CBC    Component Value Date/Time   WBC 5.3 01/27/2023 1041   WBC 5.1 01/03/2020 1021   RBC 5.05 01/27/2023 1041   RBC 4.63 01/03/2020 1021   HGB 13.8 01/27/2023 1041   HGB 13.1 12/20/2007 1044   HCT 43.3 01/27/2023 1041   HCT 39.1 12/20/2007 1044   PLT 149 (L) 01/27/2023 1041   MCV 86 01/27/2023 1041   MCV 84.3 12/20/2007 1044   MCH 27.3 01/27/2023 1041   MCH 26.8 03/25/2014 0446   MCHC 31.9 01/27/2023 1041   MCHC 32.7 01/03/2020 1021   RDW 14.1 01/27/2023 1041   RDW 15.6 (H) 12/20/2007 1044   Iron Studies    Component Value Date/Time   IRON 91 01/27/2023 1041   TIBC 301 01/27/2023 1041   FERRITIN 30 01/27/2023 1041   IRONPCTSAT 30 01/27/2023 1041   Lipid Panel     Component Value Date/Time   CHOL 169 01/27/2023 1041   TRIG 79 01/27/2023 1041   HDL 55 01/27/2023 1041   CHOLHDL 3.1 01/27/2023 1041   CHOLHDL 5.9 08/09/2007 0039   VLDL 32 08/09/2007 0039   LDLCALC 99 01/27/2023 1041   Hepatic Function Panel      Component Value Date/Time   PROT 6.6 01/27/2023 1041   ALBUMIN 3.8 (L) 01/27/2023 1041   AST 16 01/27/2023 1041   ALT 23 01/27/2023 1041   ALKPHOS 94 01/27/2023 1041   BILITOT 0.4 01/27/2023 1041      Component Value Date/Time   TSH 2.960 01/27/2023 1041   Nutritional Lab Results  Component  Value Date   VD25OH 83.8 01/27/2023   VD25OH 51.7 11/04/2017     ASSESSMENT AND PLAN  TREATMENT PLAN FOR OBESITY:  Recommended Dietary Goals  Dezaray is currently in the action stage of change. As such, her goal is to continue weight management plan. She has agreed to the Category 3 Plan.  Behavioral Intervention  We discussed the following Behavioral Modification Strategies today: increasing lean protein intake to established goals, decreasing simple carbohydrates , increasing vegetables, increasing fiber rich foods, increasing water intake , work on meal planning and preparation, reading food labels , keeping healthy foods at home, continue to work on implementation of reduced calorie nutritional plan, continue to practice mindfulness when eating, planning for success, and continue to work on maintaining a reduced calorie state, getting the recommended amount of protein, incorporating whole foods, making healthy choices, staying well hydrated and practicing mindfulness when eating..  Additional resources provided today: NA  Recommended Physical Activity Goals  Continue to follow up with ortho and PCP.  Recommended continue doing exercises suggested by the physical therapist.  ASSOCIATED CONDITIONS ADDRESSED TODAY  Action/Plan  Diet-controlled diabetes mellitus (HCC) -     Continue Ozempic (1 MG/DOSE); Inject 1 mg into the skin once a week.  Dispense: 3 mL; Refill: 0.  Side effects discussed.   HYPERTENSION Continue follow-up with PCP.  Continue medications as directed.  Gallstones Continue to follow-up with surgeon and schedule appointment.  To stop Ozempic 2 weeks prior to  surgery.  Patient verbalizes understanding.  Class 3 severe obesity due to excess calories with serious comorbidity and body mass index (BMI) of 45.0 to 49.9 in adult [facility])         Return in about 4 weeks (around 08/09/2023).Marland Kitchen She was informed of the importance of frequent follow up visits to maximize her success with intensive lifestyle modifications for her multiple health conditions.   ATTESTASTION STATEMENTS:  Reviewed by clinician on day of visit: allergies, medications, problem list, medical history, surgical history, family history, social history, and previous encounter notes.    Theodis Sato. Tywan Siever FNP-C

## 2023-08-03 NOTE — Progress Notes (Signed)
 Surgical Instructions   Your procedure is scheduled on Thursday, April 24th. Report to [facility] Main Entrance "A" at 1:30 P.M., then check in with the Admitting office. Any questions or running late day of surgery: call [phone]  Questions prior to your surgery date: call [phone], Monday-Friday, 8am-4pm. If you experience any cold or flu symptoms such as cough, fever, chills, shortness of breath, etc. between now and your scheduled surgery, please notify us at the above number.     Remember:  Do not eat after midnight the night before your surgery   You may drink clear liquids until 12:30 the morning of your surgery.   Clear liquids allowed are: Water, Non-Citrus Juices (without pulp), Carbonated Beverages, Clear Tea (no milk, honey, etc.), Black Coffee Only (NO MILK, CREAM OR POWDERED CREAMER of any kind), and Gatorade.    Take these medicines the morning of surgery with A SIP OF WATER  allopurinol (ZYLOPRIM)  colchicine  brinzolamide (AZOPT) eye drops  COMBIGAN eye drops  omeprazole (PRILOSEC)  TRELEGY ELLIPTA inhaler   May take these medicines IF NEEDED: acetaminophen (TYLENOL)  azelastine (ASTELIN) nasal spray  gabapentin (NEURONTIN)  HYDROcodone-acetaminophen (NORCO)  levalbuterol (XOPENEX HFA) inhaler   Follow your surgeon's instructions on when to stop rivaroxaban (XARELTO).  Hold for 5 days prior to your procedure; last dose to be taken on Friday, April 18th.    One week prior to surgery, STOP taking any Aspirin (unless otherwise instructed by your surgeon) Aleve, Naproxen, Ibuprofen, Motrin, Advil, Goody's, BC's, all herbal medications, fish oil, and non-prescription vitamins.          WHAT DO I DO ABOUT MY DIABETES MEDICATION?   Follow your physician's instructions on when to stop Semaglutide, (OZEMPIC).  Hold for 14 days prior to procedure; last dose on or before Wednesday, April 9th.   HOW TO MANAGE YOUR DIABETES BEFORE AND AFTER SURGERY  Why is  it important to control my blood sugar before and after surgery? Improving blood sugar levels before and after surgery helps healing and can limit problems. A way of improving blood sugar control is eating a healthy diet by:  Eating less sugar and carbohydrates  Increasing activity/exercise  Talking with your doctor about reaching your blood sugar goals High blood sugars (greater than 180 mg/dL) can raise your risk of infections and slow your recovery, so you will need to focus on controlling your diabetes during the weeks before surgery. Make sure that the doctor who takes care of your diabetes knows about your planned surgery including the date and location.  How do I manage my blood sugar before surgery? Check your blood sugar at least 4 times a day, starting 2 days before surgery, to make sure that the level is not too high or low.  Check your blood sugar the morning of your surgery when you wake up and every 2 hours until you get to the Short Stay unit.  If your blood sugar is less than 70 mg/dL, you will need to treat for low blood sugar: Do not take insulin. Treat a low blood sugar (less than 70 mg/dL) with  cup of clear juice (cranberry or apple), 4 glucose tablets, OR glucose gel. Recheck blood sugar in 15 minutes after treatment (to make sure it is greater than 70 mg/dL). If your blood sugar is not greater than 70 mg/dL on recheck, call [phone] for further instructions. Report your blood sugar to the short stay nurse when you get to Short Stay.  If  you are admitted to the hospital after surgery: Your blood sugar will be checked by the staff and you will probably be given insulin after surgery (instead of oral diabetes medicines) to make sure you have good blood sugar levels. The goal for blood sugar control after surgery is 80-180 mg/dL.             Do NOT Smoke (Tobacco/Vaping) for 24 hours prior to your procedure.  If you use a CPAP at night, you may bring your  mask/headgear for your overnight stay.   You will be asked to remove any contacts, glasses, piercing's, hearing aid's, dentures/partials prior to surgery. Please bring cases for these items if needed.    Patients discharged the day of surgery will not be allowed to drive home, and someone needs to stay with them for 24 hours.  SURGICAL WAITING ROOM VISITATION Patients may have no more than 2 support people in the waiting area - these visitors may rotate.   Pre-op nurse will coordinate an appropriate time for 1 ADULT support person, who may not rotate, to accompany patient in pre-op.  Children under the age of 81 must have an adult with them who is not the patient and must remain in the main waiting area with an adult.  If the patient needs to stay at the hospital during part of their recovery, the visitor guidelines for inpatient rooms apply.  Please refer to the [facility] website for the visitor guidelines for any additional information.   If you received a COVID test during your pre-op visit  it is requested that you wear a mask when out in public, stay away from anyone that may not be feeling well and notify your surgeon if you develop symptoms. If you have been in contact with anyone that has tested positive in the last 10 days please notify you surgeon.      Pre-operative CHG Bathing Instructions   You can play a key role in reducing the risk of infection after surgery. Your skin needs to be as free of germs as possible. You can reduce the number of germs on your skin by washing with CHG (chlorhexidine gluconate) soap before surgery. CHG is an antiseptic soap that kills germs and continues to kill germs even after washing.   DO NOT use if you have an allergy to chlorhexidine/CHG or antibacterial soaps. If your skin becomes reddened or irritated, stop using the CHG and notify one of our RNs at [phone].              TAKE A SHOWER THE NIGHT BEFORE SURGERY AND THE DAY OF SURGERY     Please keep in mind the following:  DO NOT shave, including legs and underarms, 48 hours prior to surgery.   You may shave your face before/day of surgery.  Place clean sheets on your bed the night before surgery Use a clean washcloth (not used since being washed) for each shower. DO NOT sleep with pet's night before surgery.  CHG Shower Instructions:  Wash your face and private area with normal soap. If you choose to wash your hair, wash first with your normal shampoo.  After you use shampoo/soap, rinse your hair and body thoroughly to remove shampoo/soap residue.  Turn the water OFF and apply half the bottle of CHG soap to a CLEAN washcloth.  Apply CHG soap ONLY FROM YOUR NECK DOWN TO YOUR TOES (washing for 3-5 minutes)  DO NOT use CHG soap on face, private areas,  open wounds, or sores.  Pay special attention to the area where your surgery is being performed.  If you are having back surgery, having someone wash your back for you may be helpful. Wait 2 minutes after CHG soap is applied, then you may rinse off the CHG soap.  Pat dry with a clean towel  Put on clean pajamas    Additional instructions for the day of surgery: DO NOT APPLY any lotions, deodorants, cologne, or perfumes.   Do not wear jewelry or makeup Do not wear nail polish, gel polish, artificial nails, or any other type of covering on natural nails (fingers and toes) Do not bring valuables to the hospital. [facility] is not responsible for valuables/personal belongings. Put on clean/comfortable clothes.  Please brush your teeth.  Ask your nurse before applying any prescription medications to the skin.

## 2023-08-04 ENCOUNTER — Other Ambulatory Visit: Payer: Self-pay

## 2023-08-04 ENCOUNTER — Other Ambulatory Visit (HOSPITAL_COMMUNITY)

## 2023-08-04 ENCOUNTER — Encounter (HOSPITAL_COMMUNITY)
Admission: RE | Admit: 2023-08-04 | Discharge: 2023-08-04 | Disposition: A | Discharge status: Home / Self Care | Source: Ambulatory Visit | Attending: Surgery | Admitting: Surgery

## 2023-08-04 ENCOUNTER — Encounter: Payer: Self-pay | Admitting: Nurse Practitioner

## 2023-08-04 ENCOUNTER — Ambulatory Visit: Admitting: Nurse Practitioner

## 2023-08-04 ENCOUNTER — Encounter (HOSPITAL_COMMUNITY): Payer: Self-pay

## 2023-08-04 VITALS — BP 122/72 | HR 70 | Temp 98.4°F | Resp 16 | Ht 64.5 in | Wt 281.7 lb

## 2023-08-04 VITALS — BP 127/78 | HR 89 | Temp 97.7°F | Ht 64.5 in | Wt 277.0 lb

## 2023-08-04 DIAGNOSIS — I10 Essential (primary) hypertension: Secondary | ICD-10-CM | POA: Insufficient documentation

## 2023-08-04 DIAGNOSIS — Z86718 Personal history of other venous thrombosis and embolism: Secondary | ICD-10-CM | POA: Insufficient documentation

## 2023-08-04 DIAGNOSIS — E66813 Obesity, class 3: Secondary | ICD-10-CM

## 2023-08-04 DIAGNOSIS — Z01812 Encounter for preprocedural laboratory examination: Secondary | ICD-10-CM | POA: Diagnosis not present

## 2023-08-04 DIAGNOSIS — Z96653 Presence of artificial knee joint, bilateral: Secondary | ICD-10-CM | POA: Diagnosis not present

## 2023-08-04 DIAGNOSIS — K8020 Calculus of gallbladder without cholecystitis without obstruction: Secondary | ICD-10-CM | POA: Diagnosis not present

## 2023-08-04 DIAGNOSIS — Z86711 Personal history of pulmonary embolism: Secondary | ICD-10-CM | POA: Diagnosis not present

## 2023-08-04 DIAGNOSIS — Z01818 Encounter for other preprocedural examination: Secondary | ICD-10-CM

## 2023-08-04 DIAGNOSIS — E119 Type 2 diabetes mellitus without complications: Secondary | ICD-10-CM

## 2023-08-04 DIAGNOSIS — G4733 Obstructive sleep apnea (adult) (pediatric): Secondary | ICD-10-CM | POA: Diagnosis not present

## 2023-08-04 DIAGNOSIS — Z981 Arthrodesis status: Secondary | ICD-10-CM | POA: Insufficient documentation

## 2023-08-04 DIAGNOSIS — Z9884 Bariatric surgery status: Secondary | ICD-10-CM | POA: Insufficient documentation

## 2023-08-04 DIAGNOSIS — Z7901 Long term (current) use of anticoagulants: Secondary | ICD-10-CM | POA: Insufficient documentation

## 2023-08-04 DIAGNOSIS — Z6841 Body Mass Index (BMI) 40.0 and over, adult: Secondary | ICD-10-CM

## 2023-08-04 LAB — BASIC METABOLIC PANEL WITH GFR
Anion gap: 10 (ref 5–15)
BUN: 10 mg/dL (ref 8–23)
CO2: 25 mmol/L (ref 22–32)
Calcium: 9.4 mg/dL (ref 8.9–10.3)
Chloride: 107 mmol/L (ref 98–111)
Creatinine, Ser: 1.18 mg/dL — ABNORMAL HIGH (ref 0.44–1.00)
GFR, Estimated: 52 mL/min — ABNORMAL LOW (ref >60)
Glucose, Bld: 82 mg/dL (ref 70–99)
Potassium: 3.6 mmol/L (ref 3.5–5.1)
Sodium: 142 mmol/L (ref 135–145)

## 2023-08-04 LAB — CBC
HCT: 41.7 % (ref 36.0–46.0)
Hemoglobin: 14 g/dL (ref 12.0–15.0)
MCH: 28.3 pg (ref 26.0–34.0)
MCHC: 33.6 g/dL (ref 30.0–36.0)
MCV: 84.2 fL (ref 80.0–100.0)
Platelets: 150 10*3/uL (ref 150–400)
RBC: 4.95 MIL/uL (ref 3.87–5.11)
RDW: 14.5 % (ref 11.5–15.5)
WBC: 5.3 10*3/uL (ref 4.0–10.5)
nRBC: 0 % (ref 0.0–0.2)

## 2023-08-04 LAB — HEMOGLOBIN A1C
Hgb A1c MFr Bld: 5.4 % (ref 4.8–5.6)
Mean Plasma Glucose: 108.28 mg/dL

## 2023-08-04 MED ORDER — OZEMPIC (1 MG/DOSE) 4 MG/3ML ~~LOC~~ SOPN: 1.0000 mg | PEN_INJECTOR | SUBCUTANEOUS | 0 refills | Status: DC

## 2023-08-04 NOTE — Progress Notes (Signed)
 Office: [phone]  /  Fax: [phone]  WEIGHT SUMMARY AND BIOMETRICS  Weight Lost Since Last Visit: 0lb  Weight Gained Since Last Visit: 2lb   Vitals Temp: 98.4 F (36.9 C) BP: 122/72 Pulse Rate: 70 SpO2: 98 %   Anthropometric Measurements Height: 5' 4.5" (1.638 m) Weight: 281 lb 11.2 oz (127.8 kg) BMI (Calculated): 47.62 Weight at Last Visit: 275lb Weight Lost Since Last Visit: 0lb Weight Gained Since Last Visit: 2lb Starting Weight: 300lb Total Weight Loss (lbs): 23 lb (10.4 kg)   Body Composition  Body Fat %: 57.7 % Fat Mass (lbs): 160 lbs Muscle Mass (lbs): 111.2 lbs Visceral Fat Rating : 21   Other Clinical Data Fasting: Yes Labs: No Today's Visit #: 8 Starting Date: 01/27/23     HPI  Chief Complaint: OBESITY  [name] is here to discuss her progress with her obesity treatment plan. She is on the the Category 3 Plan and states she is following her eating plan approximately 50 % of the time. She states she is exercising 20 minutes 2 days per week.   Interval History:  Since last office visit she has gained 2 pounds.  She recently had a friend to pass away.  She hasn't been doing well with following the meal plan.  Does well with breakfast but gets "side tracked" after breakfast.  She hasn't been meal prepping.  She snacks on fruit, peanut butter, yogurt and rice.  She hasn't been eating out as much as she was in the past.  She is drinking water and plans to stop carbonated drinks.  She is walking without her cane today.    Pharmacotherapy for weight loss: She is not currently taking medications  for medical weight loss.       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 [name].  Her highest weight prior to surgery was 455 pounds and her nadir weight after surgery was 240s.  She is taking Multivitamin, Vit B12 every day and Vit D every 2 weeks.  She reports no restriction.      Pharmacotherapy for  DMT2:  She is not currently taking Ozempic due to upcoming surgery on April 24th.   Last Ozempic was April 9th.  Was doing well with Ozempic 1mg  prior to stopping it.  Felt it helped with polyphagia and cravings.   Last A1c was 5.5 She is not checking BS at home.   Episodes of hypoglycemia: no On ARB. Taking Xarelto for history of DVT/PE.  Denies history of CVA.   Last eye exam:  Dec 2024  Lab Results  Component Value Date   HGBA1C 5.4 08/04/2023   HGBA1C 5.5 01/27/2023   HGBA1C 5.5 11/04/2017   Lab Results  Component Value Date   LDLCALC 99 01/27/2023   CREATININE 1.13 (H) 01/27/2023    Hypertension Hypertension looks better today.  Medication(s): Diovan HCT 80-12.5mg . Denies side effects  Denies chest pain, palpitations and SOB.  BP Readings from Last 3 Encounters:  08/04/23 122/72  08/04/23 127/78  07/12/23 136/82   Lab Results  Component Value Date   CREATININE 1.13 (H) 01/27/2023   CREATININE 1.13 (H) 11/04/2017   CREATININE 0.95 03/23/2014     PHYSICAL EXAM:  Blood pressure 127/78, pulse 89, temperature 97.7 F (36.5 C), height 5' 4.5" (1.638 m), weight 277 lb (125.6 kg), last menstrual period 06/02/2011, SpO2 100%. Body mass index is 46.81 kg/m.  General: She is overweight, cooperative,  alert, well developed, and in no acute distress. PSYCH: Has normal mood, affect and thought process.   Extremities: No edema.  Neurologic: No gross sensory or motor deficits. No tremors or fasciculations noted.    DIAGNOSTIC DATA REVIEWED:  BMET    Component Value Date/Time   NA 144 01/27/2023 1041   K 4.5 01/27/2023 1041   CL 108 (H) 01/27/2023 1041   CO2 22 01/27/2023 1041   GLUCOSE 88 01/27/2023 1041   GLUCOSE 159 (H) 03/23/2014 0435   BUN 11 01/27/2023 1041   CREATININE 1.13 (H) 01/27/2023 1041   CALCIUM 9.3 01/27/2023 1041   GFRNONAA 54 (L) 11/04/2017 0943   GFRAA 62 11/04/2017 0943   Lab Results  Component Value Date   HGBA1C 5.4 08/04/2023   HGBA1C (H)  08/09/2007    10.9 (NOTE)   The ADA recommends the following therapeutic goals for glycemic   control related to Hgb A1C measurement:   Goal of Therapy:   < 7.0% Hgb A1C   Action Suggested:  > 8.0% Hgb A1C   Ref:  Diabetes Care, 22, Suppl. 1, 1999   Lab Results  Component Value Date   INSULIN 25.1 (H) 01/27/2023   INSULIN 15.2 11/04/2017   Lab Results  Component Value Date   TSH 2.960 01/27/2023   CBC    Component Value Date/Time   WBC 5.3 08/04/2023 1255   RBC 4.95 08/04/2023 1255   HGB 14.0 08/04/2023 1255   HGB 13.8 01/27/2023 1041   HGB 13.1 12/20/2007 1044   HCT 41.7 08/04/2023 1255   HCT 43.3 01/27/2023 1041   HCT 39.1 12/20/2007 1044   PLT 150 08/04/2023 1255   PLT 149 (L) 01/27/2023 1041   MCV 84.2 08/04/2023 1255   MCV 86 01/27/2023 1041   MCV 84.3 12/20/2007 1044   MCH 28.3 08/04/2023 1255   MCHC 33.6 08/04/2023 1255   RDW 14.5 08/04/2023 1255   RDW 14.1 01/27/2023 1041   RDW 15.6 (H) 12/20/2007 1044   Iron Studies    Component Value Date/Time   IRON 91 01/27/2023 1041   TIBC 301 01/27/2023 1041   FERRITIN 30 01/27/2023 1041   IRONPCTSAT 30 01/27/2023 1041   Lipid Panel     Component Value Date/Time   CHOL 169 01/27/2023 1041   TRIG 79 01/27/2023 1041   HDL 55 01/27/2023 1041   CHOLHDL 3.1 01/27/2023 1041   CHOLHDL 5.9 08/09/2007 0039   VLDL 32 08/09/2007 0039   LDLCALC 99 01/27/2023 1041   Hepatic Function Panel     Component Value Date/Time   PROT 6.6 01/27/2023 1041   ALBUMIN 3.8 (L) 01/27/2023 1041   AST 16 01/27/2023 1041   ALT 23 01/27/2023 1041   ALKPHOS 94 01/27/2023 1041   BILITOT 0.4 01/27/2023 1041      Component Value Date/Time   TSH 2.960 01/27/2023 1041   Nutritional Lab Results  Component Value Date   VD25OH 83.8 01/27/2023   VD25OH 51.7 11/04/2017     ASSESSMENT AND PLAN  TREATMENT PLAN FOR OBESITY:  Recommended Dietary Goals  [name] is currently in the action stage of change. As such, her goal is to continue  weight management plan. She has agreed to the Category 3 Plan.  Behavioral Intervention  We discussed the following Behavioral Modification Strategies today: increasing lean protein intake to established goals, decreasing simple carbohydrates , increasing vegetables, increasing fiber rich foods, increasing water intake , work on meal planning and preparation, reading food labels , keeping  healthy foods at home, and continue to work on maintaining a reduced calorie state, getting the recommended amount of protein, incorporating whole foods, making healthy choices, staying well hydrated and practicing mindfulness when eating..  Additional resources provided today: NA  Recommended Physical Activity Goals  She has agreed to Think about enjoyable ways to increase daily physical activity and overcoming barriers to exercise, Increase physical activity in their day and reduce sedentary time (increase NEAT)., and Unable to participate in physical activity at present due to medical conditions     ASSOCIATED CONDITIONS ADDRESSED TODAY  Action/Plan  Diet-controlled diabetes mellitus (HCC) -     Ozempic (1 MG/DOSE); Inject 1 mg into the skin once a week.  Dispense: 3 mL; Refill: 0  Will continue to stay off Ozempic prior to surgery and will restart postop based upon how she is doing.  If she is not able to restart Ozempic after surgery (>2 weeks), she is to contact the office to discuss dosage restart.    HYPERTENSION Continue follow-up with PCP.  Continue medications as directed.  Gallstones Patient is scheduled for surgery on 08/12/2023.  Continue to follow-up with surgeon as directed.  Class 3 severe obesity due to excess calories with serious comorbidity and body mass index (BMI) of 45.0 to 49.9 in adult [facility])         Return in about 4 weeks (around 09/01/2023).Aaron Aas She was informed of the importance of frequent follow up visits to maximize her success with intensive lifestyle modifications  for her multiple health conditions.   ATTESTASTION STATEMENTS:  Reviewed by clinician on day of visit: allergies, medications, problem list, medical history, surgical history, family history, social history, and previous encounter notes.     Crist Dominion. Frannie Shedrick FNP-C

## 2023-08-04 NOTE — Progress Notes (Signed)
 PCP - Dr. Bart Lieu FNP Cardiologist - Dr. Jonathon Berry - LOV 12-25-22; follow up as needed. Cardiac clearance on 06-15-23.  PPM/ICD - Denies Device Orders - N/A Rep Notified - N/A  Chest x-ray - N/A EKG - 12-25-22 Stress Test - 09-21-07 ECHO - Denies Cardiac Cath - Denies  Sleep Study - Yes CPAP - wears CPAP nightly  Patient used to be a diabetic and is now considered Diet Controlled diabetic. Patient states she has not been treated for diabetes since 2015 when she had her gastric procedure.  She does not check blood sugars.  Last dose of GLP1 agonist-  Ozempic  GLP1 instructions: LD on 07-28-23 as patient states that is when her physician told her to stop.  Blood Thinner Instructions: Xarelto -  hold for 5 days, LD 08-06-23. Aspirin Instructions: Denies  ERAS Protcol - Clears until 1230 PRE-SURGERY Ensure or G2- G2  COVID TEST- N/A   Anesthesia review: Yes, cardiac clearance, HTN, DM, HX of PE/DVT, OSA   Patient denies shortness of breath, fever, cough and chest pain at PAT appointment   All instructions explained to the patient, with a verbal understanding of the material. Patient agrees to go over the instructions while at home for a better understanding. Patient also instructed to self quarantine after being tested for COVID-19. The opportunity to ask questions was provided.

## 2023-08-05 NOTE — Progress Notes (Signed)
 Anesthesia Chart Review:  Case: [id number] Date/Time: 08/12/23 1430   Procedure: LAPAROSCOPIC CHOLECYSTECTOMY   Anesthesia type: General   Diagnosis: Symptomatic cholelithiasis [K80.20]   Pre-op diagnosis: SYMPTOMATIC CHOLELITHIASIS   [facility] / [facility]   Surgeons: Oza Blumenthal, MD       DISCUSSION: Patient is 63 year old female scheduled for the above procedure.  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  Evaluated by cardiologist Dr. Katheryne Pane in September 2024 for HTN and right foot pain.  He did not think she was having claudication symptoms as she had palpable pedal pulses. More recently she had a preoperative telephonic cardiology evaluation by Rejeana Card, NP on 06/15/23. He wrote, "Patient's RCRI score is 0.9% The patient affirms she has been doing well without any new cardiac symptoms. They are able to achieve 7 METS without cardiac limitations. Therefore, based on ACC/AHA guidelines, the patient would be at acceptable risk for the planned procedure without further cardiovascular testing..." He advised Xarelto instructions per prescriber.  Dr. Lucienne Ryder had advised holding Xarelto for 2 days prior to surgery. She reported instructions to hold Xarlto after 08/06/23 dose.  Last Ozempic 07/28/23.   Anesthesia team to evaluate on the day of surgery.   VS: BP 122/72   Pulse 70   Temp 36.9 C   Resp 16   Ht 5' 4.5" (1.638 m)   Wt 127.8 kg   LMP 06/02/2011   SpO2 98%   BMI 47.61 kg/m   PROVIDERS: Jonathon Neighbors, MD is PCP Helane Lloyd, FNP as provider at [facility] Lauro Portal, MD is cardiologist Tami Falcon, MD is GI Catha Clink, MD is allergist  LABS: Labs reviewed: Acceptable for surgery. A1c 5.4%. (all labs ordered are listed, but only abnormal results are displayed)  Labs Reviewed  BASIC METABOLIC PANEL WITH GFR - Abnormal; Notable for the following components:      Result Value   Creatinine, Ser 1.18 (*)    GFR, Estimated 52 (*)    All other components within normal limits  CBC  HEMOGLOBIN A1C    Spirometry 03/03/23: FEV1: 1.63L 80%, FVC: 1.88L 72%, ratio consistent with nonobstructive pattern for age/demographic    IMAGES: CT Abd/pelvis 01/11/23: IMPRESSION: 1. No acute findings within the abdomen or pelvis. 2. Nonobstructing right renal calculus. 3. Colonic diverticulosis without signs of acute diverticulitis. 4. Status post gastric banding and gastric bypass. 5.  Aortic Atherosclerosis (ICD10-I70.0).   EKG:  EKG 05/19/23 [facility], scanned under Media tab): SB at 51 bpm  EKG 12/25/22: Sinus rhythm with Premature atrial complexes When compared with ECG of 01-Oct-2013 09:56, Premature atrial complexes are now Present Confirmed by Lauro Portal [phone]) on 12/25/2022 9:31:36 AM   CV: Remote Dobutamine stress test done through [facility] on 09/21/07, LVEF 56%.    Past Medical History:  Diagnosis Date   Anemia    Anxiety    Arthritis    knee    Asthma    Back pain    Depression    Diabetes (HCC)    patient states not treated diabetes since her Gastric procedure in 2015   DVT (deep venous thrombosis) (HCC)    Dysfunctional uterine bleeding    Dyspnea  GERD (gastroesophageal reflux disease)    Glaucoma    Headache(784.0)    MIGRAINES IN PAST   History of stomach ulcers    Hypertension    Hypothyroidism    Joint pain    Lactose intolerance    Leg edema    Morbid obesity (HCC)    Multiple food allergies    Optic neuropathy    Osteoarthritis    PE (pulmonary embolism)    Sleep apnea, obstructive    USES C-PAP   Vitreous hemorrhage of left eye (HCC) 11/13/2019    Past  Surgical History:  Procedure Laterality Date   CAPSULOTOMY  05/03/2012   Procedure: MINOR CAPSULOTOMY;  Surgeon: Vita Erm., MD;  Location: [facility] OR;  Service: Ophthalmology;  Laterality: Right;   CAPSULOTOMY  05/05/2012   Procedure: MINOR CAPSULOTOMY;  Surgeon: Vita Erm., MD;  Location: [facility] OR;  Service: Ophthalmology;  Laterality: Right;   CATARACT SURG     COLONOSCOPY WITH PROPOFOL N/A 12/06/2020   Procedure: COLONOSCOPY WITH PROPOFOL;  Surgeon: Jeani Hawking, MD;  Location: [facility] END[facility]COPY;  Service: Endoscopy;  Laterality: N/A;   DILATION AND CURETTAGE OF UTERUS     ENDOMETRIAL ABLATION     failure    EYE SURGERY  11/16/2009   FOR GLAUCOMA AND LASER SURGERY  X2   GASTRIC BYPASS  [id number]   GLAUCOMA SURGERY  10/31/2009   HERNIA REPAIR     HIATAL HERNIA REPAIR  01/30/2014   Procedure: HERNIA REPAIR HIATAL;  Surgeon: Valarie Merino, MD;  Location: [facility] ORS;  Service: General;;   HYSTEROSCOPY     IVC FILTER     2010 - THEN REMOVED THEN PLACED AGAIN 2012   JOINT REPLACEMENT     KNEE ARTHROSCOPY  12/2007   LAPAROSCOPIC GASTRIC BANDING N/A 01/30/2014   Procedure: LAPAROSCOPIC GASTRIC BANDING over bypass ;  Surgeon: Valarie Merino, MD;  Location: [facility]S;  Service: General;  Laterality: N/A;   POLYPECTOMY  12/06/2020   Procedure: POLYPECTOMY;  Surgeon: Jeani Hawking, MD;  [facility];  Service: Endoscopy;;   POSTERIOR LAMINECTOMY / DECOMPRESSION CERVICAL SPINE  01/26/2011   TOTAL KNEE ARTHROPLASTY  09/09/2011   Procedure: TOTAL KNEE ARTHROPLASTY;  Surgeon: Javier Docker, MD;  Location: [facility];  Service: Orthopedics;  Laterality: Left;   TOTAL KNEE ARTHROPLASTY Right 03/22/2014   Procedure: RIGHT TOTAL KNEE ARTHROPLASTY;  Surgeon: Javier Docker, MD;  Location: [facility];  Service: Orthopedics;  Laterality: Right;   UPPER GI ENDOSCOPY N/A 01/30/2014   Procedure: UPPER GI ENDOSCOPY;  Surgeon: Valarie Merino, MD;  Location: [facility];  Service: General;   Laterality: N/A;   YAG LASER APPLICATION  05/03/2012   Procedure: YAG LASER APPLICATION;  Surgeon: Vita Erm., MD;  Location: [facility] OR;  Service: Ophthalmology;  Laterality: Right;   YAG LASER APPLICATION  05/05/2012   Procedure: YAG LASER APPLICATION;  Surgeon: Vita Erm., MD;  Location: [facility] OR;  Service: Ophthalmology;  Laterality: Right;    MEDICATIONS:  acetaminophen (TYLENOL) 500 MG tablet   allopurinol (ZYLOPRIM) 100 MG tablet   Ascorbic Acid (VITAMIN C) 1000 MG tablet   azelastine (ASTELIN) 0.1 % nasal spray   brinzolamide (AZOPT) 1 % ophthalmic suspension   Calcium Carbonate-Vit D-Min (CALTRATE 600+D PLUS MINERALS PO)   colchicine 0.6 MG tablet   COMBIGAN 0.2-0.5 % ophthalmic solution   Cyanocobalamin (VITAMIN B-12) 2500 MCG SUBL   diltiazem (CARDIZEM CD) 240 MG 24 hr capsule  Docosahexaenoic Acid (DHA PO)   dupilumab (DUPIXENT) 300 MG/2ML prefilled syringe   fexofenadine (ALLEGRA) 180 MG tablet   furosemide (LASIX) 40 MG tablet   gabapentin (NEURONTIN) 300 MG capsule   HYDROcodone-acetaminophen (NORCO) 7.5-325 MG tablet   Latanoprostene Bunod 0.024 % SOLN   levalbuterol (XOPENEX HFA) 45 MCG/ACT inhaler   levocetirizine (XYZAL) 5 MG tablet   magnesium chloride (SLOW-MAG) 64 MG TBEC   mometasone (ELOCON) 0.1 % ointment   mometasone (NASONEX) 50 MCG/ACT nasal spray   montelukast (SINGULAIR) 10 MG tablet   Multiple Vitamin (MULTIVITAMIN WITH MINERALS) TABS tablet   Olopatadine HCl 0.2 % SOLN   omeprazole (PRILOSEC) 20 MG capsule   potassium chloride SA (K-DUR,KLOR-CON) 20 MEQ tablet   Propylene Glycol 0.6 % SOLN   pyridOXINE (VITAMIN B-6) 100 MG tablet   quiNINE (QUALAQUIN) 324 MG capsule   RHOPRESSA 0.02 % SOLN   rivaroxaban (XARELTO) 20 MG TABS tablet   Semaglutide, 1 MG/DOSE, (OZEMPIC, 1 MG/DOSE,) 4 MG/3ML SOPN   Sodium Chloride-Sodium Bicarb (NETI POT SINUS WASH NA)   TRELEGY ELLIPTA 200-62.5-25 MCG/ACT AEPB   valsartan-hydrochlorothiazide  (DIOVAN-HCT) 80-12.5 MG per tablet   Vitamin D, Ergocalciferol, (DRISDOL) 50000 UNITS CAPS capsule   zinc sulfate 220 (50 Zn) MG capsule   No current facility-administered medications for this encounter.    Ella Gun, PA-C Surgical Short Stay/Anesthesiology [facility] Phone [phone] [facility] Phone [phone] 08/05/2023 4:02 PM

## 2023-08-05 NOTE — Anesthesia Preprocedure Evaluation (Addendum)
 Anesthesia Evaluation  Patient identified by MRN, date of birth, ID band Patient awake    Reviewed: Allergy & Precautions, NPO status , Patient's Chart, lab work & pertinent test results  Airway Mallampati: IV  TM Distance: >3 FB Neck ROM: Full    Dental  (+) Teeth Intact, Dental Advisory Given   Pulmonary asthma , sleep apnea and Continuous Positive Airway Pressure Ventilation , PE (PE 07/2007; Had IVC filter placed 05/22/08 & 01/23/11)   Pulmonary exam normal breath sounds clear to auscultation       Cardiovascular hypertension (115/71), + DVT (2006)  Normal cardiovascular exam Rhythm:Regular Rate:Normal     Neuro/Psych  Headaches PSYCHIATRIC DISORDERS Anxiety Depression       GI/Hepatic Neg liver ROS,GERD  Controlled,,S/p gastric bypass 2010, gastric band 2015   Endo/Other  Hypothyroidism  Class 3 obesityBMI 47  Renal/GU Renal InsufficiencyRenal diseaseCr 1.18  negative genitourinary   Musculoskeletal  (+) Arthritis , Osteoarthritis,    Abdominal  (+) + obese  Peds  Hematology negative hematology ROS (+) Hb 14, plt 150   Anesthesia Other Findings Ozempic  LD: >2 weeks Xarelto  LD 3d  Reproductive/Obstetrics negative OB ROS                             Anesthesia Physical Anesthesia Plan  ASA: 3  Anesthesia Plan: General   Post-op Pain Management: Tylenol  PO (pre-op)*, Ketamine IV* and Dilaudid  IV   Induction: Intravenous  PONV Risk Score and Plan: 4 or greater and Ondansetron , Dexamethasone , Midazolam  and Treatment may vary due to age or medical condition  Airway Management Planned: Oral ETT and Video Laryngoscope Planned  Additional Equipment: None  Intra-op Plan:   Post-operative Plan: Extubation in OR  Informed Consent: I have reviewed the patients History and Physical, chart, labs and discussed the procedure including the risks, benefits and alternatives for the proposed  anesthesia with the patient or authorized representative who has indicated his/her understanding and acceptance.     Dental advisory given  Plan Discussed with: CRNA  Anesthesia Plan Comments:        Anesthesia Quick Evaluation

## 2023-08-11 ENCOUNTER — Ambulatory Visit: Admitting: Family Medicine

## 2023-08-11 NOTE — Progress Notes (Addendum)
 No answer from home number. Left a message to mobile number to arrive tom at 1115. NPO post mn, stop clear liquids at 1015. Left a call back number. [phone] Pt called back, she will arrive tom at 1115.

## 2023-08-11 NOTE — H&P (Signed)
 REFERRING PHYSICIAN: Self PROVIDER: Debi Fall, MD MRN: [id number] DOB: [birth year]  Subjective   Chief Complaint: New Consultation (gallstones)  History of Present Illness: [name] is a 63 y.o. female who is seenas an office consultation for evaluation of New Consultation (gallstones)  This is a 63 year old female is referred here for evaluation of symptomatic gallstones. For 2 years she has been having right upper quadrant abdominal pain which sometimes occurs with fatty meals and sometimes not. She has an extensive history of having had a laparoscopic Roux-en-Y surgery at [facility] and then a laparoscopic band in 2015 with a hiatal hernia repair per Dr. Gaylyn Keas. She is followed by nutritionists and gastroenterologist. Her previous workup for gallstones was negative several years ago but now she is found to have on CT and ultrasound multiple small gallstones. She has nausea but no vomiting. She is on Xarelto  for remote history of DVT and pulmonary embolism.  Review of Systems: A complete review of systems was obtained from the patient. I have reviewed this information and discussed as appropriate with the patient. See HPI as well for other ROS.  ROS   Medical History: Past Medical History:  Diagnosis Date  Arthritis  Asthma, unspecified asthma severity, unspecified whether complicated, unspecified whether persistent (HHS-HCC)  DVT (deep venous thrombosis) (CMS/HHS-HCC)  GERD (gastroesophageal reflux disease)  Glaucoma (increased eye pressure)  Hypertension  Pulmonary hypertension (CMS/HHS-HCC)  Sleep apnea   There is no problem list on file for this patient.  Past Surgical History:  Procedure Laterality Date  Laparoscopic gastric banding surgery 01/30/2014  Dr Gaylyn Keas  HERNIA REPAIR  HYSTERECTOMY  Total knee arthroplasty surgery    Allergies  Allergen Reactions  Penicillins Hives, Itching, Rash, Swelling and Unknown  Thimerosal Dermatitis, Hives,  Itching, Rash, Swelling and Unknown  Hydrocodone -Acetaminophen  Itching and Rash  Sulfa (Sulfonamide Antibiotics) Hives, Rash and Unknown  Patient states she get "whelps"  RED EYES/RED CHEST  Beta-Blockers (Beta-Adrenergic Blocking Agts) Itching and Other (See Comments)  Sulfur (Not Sulfa) Dermatitis, Hives, Itching and Rash   Current Outpatient Medications on File Prior to Visit  Medication Sig Dispense Refill  acetaminophen  (TYLENOL ) 500 MG tablet Take 1,000 mg by mouth  ascorbic acid , vitamin C , (VITAMIN C ) 1000 MG tablet Take 1,000 mg by mouth  azelastine  (ASTELIN ) 137 mcg nasal spray USE 2 SPRAYS IN EACH NOSTRIL TWICE DAILY AS NEEDED(NASAL DRAINAGE) AS DIRECTED  bimatoprost  (LUMIGAN ) 0.01 % ophthalmic solution Apply 1 drop to eye at bedtime  brinzolamide  (AZOPT ) 1 % ophthalmic suspension Apply 1 drop to eye 3 (three) times daily  cholecalciferol (VITAMIN D3) 1,250 mcg (50,000 unit) capsule Take 1 capsule by mouth once a week  cyanocobalamin , vitamin B-12, (VITAMIN B-12) 2,500 mcg Subl Place 5,000 mcg under the tongue  dilTIAZem  (CARDIZEM  CD) 240 MG CD capsule Take 240 mg by mouth  dupilumab  (DUPIXENT  SYRINGE) 300 mg/2 mL inj syringe Inject 1 Syringe subcutaneously every 14 (fourteen) days  fexofenadine (ALLEGRA) 180 MG tablet Take 180 mg by mouth  FUROsemide (LASIX) 40 MG tablet Take 40 mg by mouth  gabapentin  (NEURONTIN ) 300 MG capsule 3 (three) times daily  HYDROcodone -acetaminophen  (NORCO) 7.5-325 mg tablet Take 1 tablet by mouth every 6 (six) hours as needed  latanoprostene bunod (VYZULTA) 0.024 % eye drops Apply 1 drop to eye once daily  levalbuterol  (XOPENEX  HFA) inhaler Inhale 2 Inhalations into the lungs every 4 (four) hours as needed  levalbuterol  (XOPENEX ) 1.25 mg/3 mL nebulizer solution Inhale 1.25 mg into the lungs  loratadine -pseudoephedrine  (  ALAVERT  D-12 ALLERGY-SINUS) 5-120 mg ER tablet Take 1 tablet by mouth  magnesium  chloride (SLOW-MAG) 71.5 mg DR tablet Take 1-2  tablets by mouth  meloxicam (MOBIC) 7.5 MG tablet Take 7.5 mg by mouth  mometasone  (ELOCON ) 0.1 % ointment Apply topically  mometasone  (NASONEX ) 50 mcg/actuation nasal spray Place 2 sprays into both nostrils once daily  montelukast  (SINGULAIR ) 10 mg tablet Take 10 mg by mouth at bedtime  multivitamin with minerals tablet Take 2 tablets by mouth every evening  olopatadine  (PATADAY ) 0.2 % ophthalmic solution Apply 1 drop to eye  omeprazole  (PRILOSEC) 20 MG DR capsule Take 20 mg by mouth once daily  OZEMPIC  0.25 mg or 0.5 mg (2 mg/3 mL) pen injector INJECT 1/2 (ONE-HALF) MG SUBCUTANEOUSLY ONCE A WEEK  potassium chloride  (KLOR-CON  M20) 20 MEQ ER tablet Take 20-40 mEq by mouth  prednisoLONE acetate (PRED FORTE) 1 % ophthalmic suspension Apply 1 drop to eye  propylene glycoL (SYSTANE BALANCE) 0.6 % ophthalmic drops Apply 1 drop to eye 3 (three) times daily as needed  pyridoxine , vitamin B6, (B-6) 100 MG tablet Take 200 mg by mouth at bedtime  quiNINE  (QUALAQUIN ) 324 mg capsule Take 324 mg by mouth  rivaroxaban  (XARELTO ) 20 mg tablet Take 20 mg by mouth at bedtime  TRELEGY ELLIPTA  200-62.5-25 mcg inhaler Inhale 1 Puff into the lungs once daily  valsartan -hydroCHLOROthiazide  (DIOVAN -HCT) 80-12.5 mg tablet Take 1 tablet by mouth  zinc sulfate (ZINCATE) 50 mg zinc (220 mg) capsule Take 220 mg by mouth   No current facility-administered medications on file prior to visit.   Family History  Problem Relation Age of Onset  Stroke Mother  Obesity Mother  High blood pressure (Hypertension) Mother  Diabetes Mother  High blood pressure (Hypertension) Father  Diabetes Father  Obesity Sister  High blood pressure (Hypertension) Sister  Diabetes Sister  Stroke Brother  High blood pressure (Hypertension) Brother  Diabetes Brother    Social History   Tobacco Use  Smoking Status Never  Smokeless Tobacco Never    Social History   Socioeconomic History  Marital status: Single  Tobacco Use   Smoking status: Never  Smokeless tobacco: Never  Vaping Use  Vaping status: Never Used  Substance and Sexual Activity  Alcohol  use: Not Currently  Drug use: Never   Social Drivers of Health   Received from [employer]  Social Network  Housing Stability: Unknown (05/17/2023)  Housing Stability Vital Sign  Homeless in the Last Year: No   Objective:   Vitals:   BP: 136/82  Pulse: 78  Temp: 36.8 C (98.2 F)  SpO2: 96%  Weight: (!) 130.9 kg (288 lb 9.6 oz)  Height: 164.5 cm (5' 4.75")  PainSc: 0-No pain   Body mass index is 48.4 kg/m.  Physical Exam   She appears well on exam  She is walking with a walker  Abdomen is soft. There is some tenderness with guarding in the right upper quadrant. Her abdomen is obese.  Labs, Imaging and Diagnostic Testing: I have reviewed the notes in the electronic medical records including her ultrasound showing gallstones and a 3.5 mm common bile duct. Liver function test are unremarkable. I have reviewed the notes from the loss center, her primary care provider, and gastroenterology  Assessment and Plan:   Diagnoses and all orders for this visit:  Symptomatic cholelithiasis   I discussed the diagnosis of gallstones with the patient in detail. We discussed continue conservative management versus surgical removal of the gallbladder. I gave her literature  regarding laparoscopic cholecystectomy. I explained the surgical procedure in detail. We discussed the risks which includes but is not limited to bleeding, infection, injury to surrounding structures, the need to convert to an open procedure especially given her previous abdominal surgery, bile duct injury, bile leak, cardiopulmonary issues, DVT, etc. She will need to hold her Xarelto  2 days preoperatively and will need to hold her Ozempic  1 week preoperatively as well. She understands and wished to proceed with surgery which will be scheduled

## 2023-08-12 ENCOUNTER — Encounter (HOSPITAL_COMMUNITY)
Admission: RE | Disposition: A | Discharge status: Home / Self Care | Payer: Self-pay | Source: Home / Self Care | Attending: Surgery

## 2023-08-12 ENCOUNTER — Encounter (HOSPITAL_COMMUNITY): Payer: Self-pay | Admitting: Surgery

## 2023-08-12 ENCOUNTER — Other Ambulatory Visit: Payer: Self-pay

## 2023-08-12 ENCOUNTER — Ambulatory Visit (HOSPITAL_COMMUNITY): Payer: Self-pay | Admitting: Vascular Surgery

## 2023-08-12 ENCOUNTER — Ambulatory Visit (HOSPITAL_BASED_OUTPATIENT_CLINIC_OR_DEPARTMENT_OTHER): Payer: Self-pay

## 2023-08-12 ENCOUNTER — Observation Stay (HOSPITAL_COMMUNITY)
Admission: RE | Admit: 2023-08-12 | Discharge: 2023-08-13 | Disposition: A | Discharge status: Home / Self Care | Attending: Surgery | Admitting: Surgery

## 2023-08-12 DIAGNOSIS — Z96652 Presence of left artificial knee joint: Secondary | ICD-10-CM | POA: Diagnosis not present

## 2023-08-12 DIAGNOSIS — K8010 Calculus of gallbladder with chronic cholecystitis without obstruction: Secondary | ICD-10-CM

## 2023-08-12 DIAGNOSIS — J45909 Unspecified asthma, uncomplicated: Secondary | ICD-10-CM | POA: Diagnosis not present

## 2023-08-12 DIAGNOSIS — Z7901 Long term (current) use of anticoagulants: Secondary | ICD-10-CM | POA: Insufficient documentation

## 2023-08-12 DIAGNOSIS — Z79899 Other long term (current) drug therapy: Secondary | ICD-10-CM | POA: Insufficient documentation

## 2023-08-12 DIAGNOSIS — Z86718 Personal history of other venous thrombosis and embolism: Secondary | ICD-10-CM | POA: Diagnosis not present

## 2023-08-12 DIAGNOSIS — E039 Hypothyroidism, unspecified: Secondary | ICD-10-CM

## 2023-08-12 DIAGNOSIS — I10 Essential (primary) hypertension: Secondary | ICD-10-CM

## 2023-08-12 DIAGNOSIS — E119 Type 2 diabetes mellitus without complications: Secondary | ICD-10-CM

## 2023-08-12 DIAGNOSIS — Z9049 Acquired absence of other specified parts of digestive tract: Principal | ICD-10-CM

## 2023-08-12 HISTORY — PX: CHOLECYSTECTOMY: SHX55

## 2023-08-12 LAB — GLUCOSE, CAPILLARY
Glucose-Capillary: 73 mg/dL (ref 70–99)
Glucose-Capillary: 150 mg/dL — ABNORMAL HIGH (ref 70–99)
Glucose-Capillary: 111 mg/dL — ABNORMAL HIGH (ref 70–99)

## 2023-08-12 SURGERY — LAPAROSCOPIC CHOLECYSTECTOMY
Anesthesia: General | Site: Abdomen

## 2023-08-12 MED ORDER — DEXMEDETOMIDINE HCL IN NACL 80 MCG/20ML IV SOLN
INTRAVENOUS | Status: DC | PRN
  Administered 2023-08-12: 8 ug via INTRAVENOUS

## 2023-08-12 MED ORDER — DILTIAZEM HCL ER COATED BEADS 120 MG PO CP24
240.0000 mg | ORAL_CAPSULE | Freq: Every day | ORAL | Status: DC
Start: 2023-08-12 — End: 2023-08-13
  Administered 2023-08-12: 240 mg via ORAL
  Filled 2023-08-12: qty 2

## 2023-08-12 MED ORDER — BRIMONIDINE TARTRATE-TIMOLOL 0.2-0.5 % OP SOLN: 1.0000 [drp] | Freq: Three times a day (TID) | OPHTHALMIC | Status: DC

## 2023-08-12 MED ORDER — DIPHENHYDRAMINE HCL 50 MG/ML IJ SOLN: 12.5000 mg | Freq: Four times a day (QID) | INTRAMUSCULAR | Status: DC | PRN

## 2023-08-12 MED ORDER — BRIMONIDINE TARTRATE 0.2 % OP SOLN
1.0000 [drp] | Freq: Three times a day (TID) | OPHTHALMIC | Status: DC
  Administered 2023-08-12 (×2): 1 [drp] via OPHTHALMIC
  Filled 2023-08-12 (×2): qty 5

## 2023-08-12 MED ORDER — BRINZOLAMIDE 1 % OP SUSP
1.0000 [drp] | Freq: Three times a day (TID) | OPHTHALMIC | Status: DC
  Administered 2023-08-12: 1 [drp] via OPHTHALMIC
  Filled 2023-08-12: qty 10

## 2023-08-12 MED ORDER — BUPIVACAINE-EPINEPHRINE (PF) 0.25% -1:200000 IJ SOLN
INTRAMUSCULAR | Status: AC
  Filled 2023-08-12: qty 30

## 2023-08-12 MED ORDER — VALSARTAN-HYDROCHLOROTHIAZIDE 80-12.5 MG PO TABS: 1.0000 | ORAL_TABLET | Freq: Every morning | ORAL | Status: DC

## 2023-08-12 MED ORDER — QUININE SULFATE 324 MG PO CAPS
324.0000 mg | ORAL_CAPSULE | Freq: Every day | ORAL | Status: DC
  Administered 2023-08-12: 324 mg via ORAL
  Filled 2023-08-12 (×2): qty 1

## 2023-08-12 MED ORDER — MIDAZOLAM HCL 2 MG/2ML IJ SOLN
INTRAMUSCULAR | Status: AC
  Filled 2023-08-12: qty 2

## 2023-08-12 MED ORDER — IRBESARTAN 75 MG PO TABS
75.0000 mg | ORAL_TABLET | Freq: Every day | ORAL | Status: DC
  Administered 2023-08-13: 75 mg via ORAL
  Filled 2023-08-12: qty 1

## 2023-08-12 MED ORDER — TRAMADOL HCL 50 MG PO TABS
50.0000 mg | ORAL_TABLET | Freq: Four times a day (QID) | ORAL | Status: DC | PRN
  Administered 2023-08-13: 50 mg via ORAL
  Filled 2023-08-12: qty 1

## 2023-08-12 MED ORDER — DEXAMETHASONE SODIUM PHOSPHATE 10 MG/ML IJ SOLN
INTRAMUSCULAR | Status: DC | PRN
Start: 2023-08-12 — End: 2023-08-12
  Administered 2023-08-12: 10 mg via INTRAVENOUS

## 2023-08-12 MED ORDER — PROPOFOL 10 MG/ML IV BOLUS
INTRAVENOUS | Status: DC | PRN
  Administered 2023-08-12: 130 mg via INTRAVENOUS

## 2023-08-12 MED ORDER — DIPHENHYDRAMINE HCL 12.5 MG/5ML PO ELIX: 12.5000 mg | ORAL_SOLUTION | Freq: Four times a day (QID) | ORAL | Status: DC | PRN

## 2023-08-12 MED ORDER — ONDANSETRON 4 MG PO TBDP
4.0000 mg | ORAL_TABLET | Freq: Four times a day (QID) | ORAL | Status: DC | PRN
Start: 2023-08-12 — End: 2023-08-13
  Filled 2023-08-12: qty 1

## 2023-08-12 MED ORDER — SUGAMMADEX SODIUM 200 MG/2ML IV SOLN
INTRAVENOUS | Status: DC | PRN
  Administered 2023-08-12: 400 mg via INTRAVENOUS

## 2023-08-12 MED ORDER — CHLORHEXIDINE GLUCONATE CLOTH 2 % EX PADS
6.0000 | MEDICATED_PAD | Freq: Once | CUTANEOUS | Status: DC
Start: 2023-08-12 — End: 2023-08-12

## 2023-08-12 MED ORDER — HYDROCHLOROTHIAZIDE 12.5 MG PO TABS
12.5000 mg | ORAL_TABLET | Freq: Every day | ORAL | Status: DC
  Administered 2023-08-13: 12.5 mg via ORAL
  Filled 2023-08-12: qty 1

## 2023-08-12 MED ORDER — ONDANSETRON HCL 4 MG/2ML IJ SOLN
INTRAMUSCULAR | Status: AC
  Filled 2023-08-12: qty 2

## 2023-08-12 MED ORDER — POTASSIUM CHLORIDE IN NACL 20-0.9 MEQ/L-% IV SOLN
INTRAVENOUS | Status: DC
  Filled 2023-08-12: qty 1000

## 2023-08-12 MED ORDER — ENOXAPARIN SODIUM 40 MG/0.4ML IJ SOSY
40.0000 mg | PREFILLED_SYRINGE | INTRAMUSCULAR | Status: DC
  Administered 2023-08-13: 40 mg via SUBCUTANEOUS
  Filled 2023-08-12: qty 0.4

## 2023-08-12 MED ORDER — AMISULPRIDE (ANTIEMETIC) 5 MG/2ML IV SOLN: 10.0000 mg | Freq: Once | INTRAVENOUS | Status: DC | PRN

## 2023-08-12 MED ORDER — LORATADINE 10 MG PO TABS
10.0000 mg | ORAL_TABLET | Freq: Every evening | ORAL | Status: DC
  Administered 2023-08-12: 10 mg via ORAL
  Filled 2023-08-12: qty 1

## 2023-08-12 MED ORDER — BUPIVACAINE-EPINEPHRINE 0.25% -1:200000 IJ SOLN
INTRAMUSCULAR | Status: DC | PRN
  Administered 2023-08-12: 20 mL

## 2023-08-12 MED ORDER — HYDROCODONE-ACETAMINOPHEN 5-325 MG PO TABS
1.0000 | ORAL_TABLET | ORAL | Status: DC | PRN
  Administered 2023-08-12: 1 via ORAL
  Administered 2023-08-12: 2 via ORAL
  Filled 2023-08-12: qty 1

## 2023-08-12 MED ORDER — ORAL CARE MOUTH RINSE: 15.0000 mL | Freq: Once | OROMUCOSAL | Status: AC

## 2023-08-12 MED ORDER — HYDROCODONE-ACETAMINOPHEN 5-325 MG PO TABS
ORAL_TABLET | ORAL | Status: AC
Start: 2023-08-12 — End: 2023-08-13
  Filled 2023-08-12: qty 2

## 2023-08-12 MED ORDER — OXYCODONE HCL 5 MG PO TABS: 5.0000 mg | ORAL_TABLET | Freq: Once | ORAL | Status: DC | PRN

## 2023-08-12 MED ORDER — PHENYLEPHRINE HCL-NACL 20-0.9 MG/250ML-% IV SOLN
INTRAVENOUS | Status: DC | PRN
Start: 2023-08-12 — End: 2023-08-12
  Administered 2023-08-12: 50 ug/min via INTRAVENOUS

## 2023-08-12 MED ORDER — PHENYLEPHRINE 80 MCG/ML (10ML) SYRINGE FOR IV PUSH (FOR BLOOD PRESSURE SUPPORT)
PREFILLED_SYRINGE | INTRAVENOUS | Status: DC | PRN
  Administered 2023-08-12: 240 ug via INTRAVENOUS

## 2023-08-12 MED ORDER — ONDANSETRON HCL 4 MG/2ML IJ SOLN
4.0000 mg | Freq: Four times a day (QID) | INTRAMUSCULAR | Status: DC | PRN
Start: 2023-08-12 — End: 2023-08-13

## 2023-08-12 MED ORDER — PROPOFOL 10 MG/ML IV BOLUS
INTRAVENOUS | Status: AC
  Filled 2023-08-12: qty 20

## 2023-08-12 MED ORDER — CHLORHEXIDINE GLUCONATE 0.12 % MT SOLN
15.0000 mL | Freq: Once | OROMUCOSAL | Status: AC
  Administered 2023-08-12: 15 mL via OROMUCOSAL
  Filled 2023-08-12: qty 15

## 2023-08-12 MED ORDER — CHLORHEXIDINE GLUCONATE CLOTH 2 % EX PADS: 6.0000 | MEDICATED_PAD | Freq: Once | CUTANEOUS | Status: DC

## 2023-08-12 MED ORDER — 0.9 % SODIUM CHLORIDE (POUR BTL) OPTIME
TOPICAL | Status: DC | PRN
  Administered 2023-08-12: 1000 mL

## 2023-08-12 MED ORDER — COLCHICINE 0.6 MG PO TABS
0.6000 mg | ORAL_TABLET | Freq: Every day | ORAL | Status: DC
  Administered 2023-08-13: 0.6 mg via ORAL
  Filled 2023-08-12: qty 1

## 2023-08-12 MED ORDER — OXYCODONE HCL 5 MG/5ML PO SOLN: 5.0000 mg | Freq: Once | ORAL | Status: DC | PRN

## 2023-08-12 MED ORDER — LIDOCAINE 2% (20 MG/ML) 5 ML SYRINGE
INTRAMUSCULAR | Status: DC | PRN
  Administered 2023-08-12: 100 mg via INTRAVENOUS

## 2023-08-12 MED ORDER — PANTOPRAZOLE SODIUM 40 MG PO TBEC
40.0000 mg | DELAYED_RELEASE_TABLET | Freq: Every day | ORAL | Status: DC
  Administered 2023-08-12 – 2023-08-13 (×2): 40 mg via ORAL
  Filled 2023-08-12 (×2): qty 1

## 2023-08-12 MED ORDER — BUDESON-GLYCOPYRROL-FORMOTEROL 160-9-4.8 MCG/ACT IN AERO
2.0000 | INHALATION_SPRAY | Freq: Two times a day (BID) | RESPIRATORY_TRACT | Status: DC
  Administered 2023-08-13: 2 via RESPIRATORY_TRACT
  Filled 2023-08-12: qty 5.9

## 2023-08-12 MED ORDER — DEXAMETHASONE SODIUM PHOSPHATE 10 MG/ML IJ SOLN
INTRAMUSCULAR | Status: AC
  Filled 2023-08-12: qty 1

## 2023-08-12 MED ORDER — FENTANYL CITRATE (PF) 250 MCG/5ML IJ SOLN
INTRAMUSCULAR | Status: AC
  Filled 2023-08-12: qty 5

## 2023-08-12 MED ORDER — GABAPENTIN 300 MG PO CAPS
300.0000 mg | ORAL_CAPSULE | Freq: Two times a day (BID) | ORAL | Status: DC
  Administered 2023-08-12: 300 mg via ORAL
  Filled 2023-08-12 (×2): qty 1

## 2023-08-12 MED ORDER — MIDAZOLAM HCL 2 MG/2ML IJ SOLN
INTRAMUSCULAR | Status: DC | PRN
  Administered 2023-08-12: 2 mg via INTRAVENOUS

## 2023-08-12 MED ORDER — HYDROMORPHONE HCL 1 MG/ML IJ SOLN
0.2500 mg | INTRAMUSCULAR | Status: DC | PRN
  Administered 2023-08-12 (×2): 0.5 mg via INTRAVENOUS

## 2023-08-12 MED ORDER — ENSURE PRE-SURGERY PO LIQD: 296.0000 mL | Freq: Once | ORAL | Status: DC

## 2023-08-12 MED ORDER — HYDROMORPHONE HCL 1 MG/ML IJ SOLN
INTRAMUSCULAR | Status: AC
  Filled 2023-08-12: qty 1

## 2023-08-12 MED ORDER — ONDANSETRON HCL 4 MG/2ML IJ SOLN
INTRAMUSCULAR | Status: DC | PRN
  Administered 2023-08-12: 4 mg via INTRAVENOUS

## 2023-08-12 MED ORDER — LACTATED RINGERS IV SOLN: INTRAVENOUS | Status: DC

## 2023-08-12 MED ORDER — TIMOLOL MALEATE 0.5 % OP SOLN
1.0000 [drp] | Freq: Three times a day (TID) | OPHTHALMIC | Status: DC
  Administered 2023-08-12: 1 [drp] via OPHTHALMIC
  Filled 2023-08-12: qty 5

## 2023-08-12 MED ORDER — SODIUM CHLORIDE 0.9 % IR SOLN
Status: DC | PRN
  Administered 2023-08-12: 1000 mL

## 2023-08-12 MED ORDER — ONDANSETRON HCL 4 MG/2ML IJ SOLN: 4.0000 mg | Freq: Once | INTRAMUSCULAR | Status: DC | PRN

## 2023-08-12 MED ORDER — ACETAMINOPHEN 500 MG PO TABS: 1000.0000 mg | ORAL_TABLET | ORAL | Status: DC

## 2023-08-12 MED ORDER — ROCURONIUM BROMIDE 10 MG/ML (PF) SYRINGE
PREFILLED_SYRINGE | INTRAVENOUS | Status: AC
  Filled 2023-08-12: qty 10

## 2023-08-12 MED ORDER — NETARSUDIL DIMESYLATE 0.02 % OP SOLN
1.0000 [drp] | Freq: Every morning | OPHTHALMIC | Status: DC
Start: 2023-08-13 — End: 2023-08-13

## 2023-08-12 MED ORDER — HYDROMORPHONE HCL 1 MG/ML IJ SOLN: 1.0000 mg | INTRAMUSCULAR | Status: DC | PRN

## 2023-08-12 MED ORDER — LIDOCAINE 2% (20 MG/ML) 5 ML SYRINGE
INTRAMUSCULAR | Status: AC
  Filled 2023-08-12: qty 5

## 2023-08-12 MED ORDER — FENTANYL CITRATE (PF) 250 MCG/5ML IJ SOLN
INTRAMUSCULAR | Status: DC | PRN
Start: 2023-08-12 — End: 2023-08-12
  Administered 2023-08-12: 100 ug via INTRAVENOUS
  Administered 2023-08-12: 50 ug via INTRAVENOUS

## 2023-08-12 MED ORDER — ACETAMINOPHEN 500 MG PO TABS
1000.0000 mg | ORAL_TABLET | Freq: Four times a day (QID) | ORAL | Status: DC
  Administered 2023-08-12 – 2023-08-13 (×3): 1000 mg via ORAL
  Filled 2023-08-12 (×3): qty 2

## 2023-08-12 MED ORDER — CIPROFLOXACIN IN D5W 400 MG/200ML IV SOLN
400.0000 mg | INTRAVENOUS | Status: AC
  Administered 2023-08-12: 400 mg via INTRAVENOUS
  Filled 2023-08-12: qty 200

## 2023-08-12 SURGICAL SUPPLY — 28 items
BAG COUNTER SPONGE SURGICOUNT (BAG) ×1 IMPLANT
CANISTER SUCT 3000ML PPV (MISCELLANEOUS) ×1 IMPLANT
CHLORAPREP W/TINT 26 (MISCELLANEOUS) ×1 IMPLANT
CLIP APPLIE 5 13 M/L LIGAMAX5 (MISCELLANEOUS) ×1 IMPLANT
COVER SURGICAL LIGHT HANDLE (MISCELLANEOUS) ×1 IMPLANT
DERMABOND ADVANCED .7 DNX12 (GAUZE/BANDAGES/DRESSINGS) ×1 IMPLANT
ELECTRODE REM PT RTRN 9FT ADLT (ELECTROSURGICAL) ×1 IMPLANT
GLOVE SURG SIGNA 7.5 PF LTX (GLOVE) ×1 IMPLANT
GOWN STRL REUS W/ TWL LRG LVL3 (GOWN DISPOSABLE) ×2 IMPLANT
GOWN STRL REUS W/ TWL XL LVL3 (GOWN DISPOSABLE) ×1 IMPLANT
IRRIGATION SUCT STRKRFLW 2 WTP (MISCELLANEOUS) ×1 IMPLANT
KIT BASIN OR (CUSTOM PROCEDURE TRAY) ×1 IMPLANT
KIT TURNOVER KIT B (KITS) ×1 IMPLANT
NS IRRIG 1000ML POUR BTL (IV SOLUTION) ×1 IMPLANT
PAD ARMBOARD POSITIONER FOAM (MISCELLANEOUS) ×1 IMPLANT
SCISSORS LAP 5X35 DISP (ENDOMECHANICALS) ×1 IMPLANT
SET TUBE SMOKE EVAC HIGH FLOW (TUBING) ×1 IMPLANT
SLEEVE Z-THREAD 5X100MM (TROCAR) ×2 IMPLANT
SPECIMEN JAR SMALL (MISCELLANEOUS) ×1 IMPLANT
SUT MNCRL AB 4-0 PS2 18 (SUTURE) ×1 IMPLANT
SYSTEM BAG RETRIEVAL 10MM (BASKET) ×1 IMPLANT
TOWEL GREEN STERILE (TOWEL DISPOSABLE) ×1 IMPLANT
TOWEL GREEN STERILE FF (TOWEL DISPOSABLE) ×1 IMPLANT
TRAY LAPAROSCOPIC MC (CUSTOM PROCEDURE TRAY) ×1 IMPLANT
TROCAR BALLN 12MMX100 BLUNT (TROCAR) ×1 IMPLANT
TROCAR Z-THREAD OPTICAL 5X100M (TROCAR) ×1 IMPLANT
WARMER LAPAROSCOPE (MISCELLANEOUS) ×1 IMPLANT
WATER STERILE IRR 1000ML POUR (IV SOLUTION) ×1 IMPLANT

## 2023-08-12 NOTE — Anesthesia Postprocedure Evaluation (Signed)
 Anesthesia Post Note  Patient: [name]  Procedure(s) Performed: LAPAROSCOPIC CHOLECYSTECTOMY (Abdomen)     Patient location during evaluation: PACU Anesthesia Type: General Level of consciousness: awake and alert Pain management: pain level controlled Vital Signs Assessment: post-procedure vital signs reviewed and stable Respiratory status: spontaneous breathing, nonlabored ventilation and respiratory function stable Cardiovascular status: blood pressure returned to baseline and stable Postop Assessment: no apparent nausea or vomiting Anesthetic complications: no   No notable events documented.  Last Vitals:  Vitals:   08/12/23 1545 08/12/23 1600  BP: 101/61 121/69  Pulse: 61 (!) 55  Resp: 14 10  Temp:  36.6 C  SpO2: 95% 96%    Last Pain:  Vitals:   08/12/23 1545  TempSrc:   PainSc: 2                  Erin Havers

## 2023-08-12 NOTE — Interval H&P Note (Signed)
 History and Physical Interval Note: no change in H and P  08/12/2023 12:52 PM  [name]  has presented today for surgery, with the diagnosis of SYMPTOMATIC CHOLELITHIASIS.  The various methods of treatment have been discussed with the patient and family. After consideration of risks, benefits and other options for treatment, the patient has consented to  Procedure(s): LAPAROSCOPIC CHOLECYSTECTOMY (N/A) as a surgical intervention.  The patient's history has been reviewed, patient examined, no change in status, stable for surgery.  I have reviewed the patient's chart and labs.  Questions were answered to the patient's satisfaction.     Oza Blumenthal

## 2023-08-12 NOTE — Transfer of Care (Signed)
 Immediate Anesthesia Transfer of Care Note  Patient: [name]  Procedure(s) Performed: LAPAROSCOPIC CHOLECYSTECTOMY (Abdomen)  Patient Location: PACU  Anesthesia Type:General  Level of Consciousness: awake  Airway & Oxygen Therapy: Patient Spontanous Breathing and Patient connected to face mask oxygen  Post-op Assessment: Report given to RN and Post -op Vital signs reviewed and stable  Post vital signs: Reviewed and stable  Last Vitals:  Vitals Value Taken Time  BP 123/83 08/12/23 1451  Temp    Pulse 64 08/12/23 1455  Resp 15 08/12/23 1455  SpO2 100 % 08/12/23 1455  Vitals shown include unfiled device data.  Last Pain:  Vitals:   08/12/23 1145  TempSrc:   PainSc: 0-No pain      Patients Stated Pain Goal: 0 (08/12/23 1145)  Complications: No notable events documented.

## 2023-08-12 NOTE — Anesthesia Procedure Notes (Signed)
 Procedure Name: Intubation Date/Time: 08/12/2023 1:59 PM  Performed by: Hebert Littler, CRNAPre-anesthesia Checklist: Patient identified, Emergency Drugs available, Suction available and Patient being monitored Patient Re-evaluated:Patient Re-evaluated prior to induction Oxygen Delivery Method: Circle System Utilized Preoxygenation: Pre-oxygenation with 100% oxygen Induction Type: IV induction Ventilation: Two handed mask ventilation required Laryngoscope Size: Glidescope and 4 Grade View: Grade I Tube type: Oral Tube size: 7.0 mm Number of attempts: 1 Airway Equipment and Method: Stylet and Oral airway Placement Confirmation: ETT inserted through vocal cords under direct vision, positive ETCO2 and breath sounds checked- equal and bilateral Secured at: 23 cm Tube secured with: Tape Dental Injury: Teeth and Oropharynx as per pre-operative assessment

## 2023-08-12 NOTE — Progress Notes (Signed)
 Patient arrived to 6N via wheelchair with a 2/10 pain located in the abdomen. Patient transferred from wheelchair to bed with no difficulties. Bed in lowest position, call light within reach.

## 2023-08-12 NOTE — Plan of Care (Signed)
   Problem: Activity: Goal: Risk for activity intolerance will decrease Outcome: Progressing   Problem: Nutrition: Goal: Adequate nutrition will be maintained Outcome: Progressing   Problem: Coping: Goal: Level of anxiety will decrease Outcome: Progressing

## 2023-08-12 NOTE — Op Note (Signed)
 Laparoscopic Cholecystectomy Procedure Note  Indications: This patient presents with symptomatic gallbladder disease and will undergo laparoscopic cholecystectomy.  Pre-operative Diagnosis: symptomatic cholelithiasis  Post-operative Diagnosis: same  Surgeon: Oza Blumenthal ,MD  Assistants: Sim Dryer, MD   Anesthesia: General endotracheal anesthesia  ASA Class: 3  Procedure Details  The patient was seen again in the Holding Room. The risks, benefits, complications, treatment options, and expected outcomes were discussed with the patient. The possibilities of reaction to medication, pulmonary aspiration, perforation of viscus, bleeding, recurrent infection, finding a normal gallbladder, the need for additional procedures, failure to diagnose a condition, the possible need to convert to an open procedure, and creating a complication requiring transfusion or operation were discussed with the patient. The likelihood of improving the patient's symptoms with return to their baseline status is good.  The patient and/or family concurred with the proposed plan, giving informed consent. The site of surgery properly noted. The patient was taken to Operating Room, identified as [name] and the procedure verified as Laparoscopic Cholecystectomy with Intraoperative Cholangiogram. A Time Out was held and the above information confirmed.  Prior to the induction of general anesthesia, antibiotic prophylaxis was administered. General endotracheal anesthesia was then administered and tolerated well. After the induction, the abdomen was prepped with Chloraprep and draped in sterile fashion. The patient was positioned in the supine position.  Local anesthetic agent was injected into the skin above the umbilicus and an incision made. We dissected down to the abdominal fascia with blunt dissection.  The fascia was incised vertically and we entered the peritoneal cavity bluntly.  A pursestring suture of  0-Vicryl was placed around the fascial opening.  The Hasson cannula was inserted and secured with the stay suture.  Pneumoperitoneum was then created with CO2 and tolerated well without any adverse changes in the patient's vital signs. A 5-mm port was placed in the subxiphoid position.  Two 5-mm ports were placed in the right upper quadrant. All skin incisions were infiltrated with a local anesthetic agent before making the incision and placing the trocars.   We positioned the patient in reverse Trendelenburg, tilted slightly to the patient's left.  The tubing from the lap band was easily visible.  The gallbladder was identified, the fundus grasped and retracted cephalad. Adhesions were lysed bluntly and with the electrocautery where indicated, taking care not to injure any adjacent organs or viscus. The infundibulum was grasped and retracted laterally, exposing the peritoneum overlying the triangle of Calot. This was then divided and exposed in a blunt fashion. The cystic duct was clearly identified and bluntly dissected circumferentially. A critical view of the cystic duct and cystic artery was obtained.  The cystic duct was then ligated with clips and divided. The cystic artery was, dissected free, ligated with clips and divided as well.   The gallbladder was dissected from the liver bed in retrograde fashion with the electrocautery. The gallbladder was removed and placed in an Endocatch sac. The liver bed was irrigated and inspected. Hemostasis was achieved with the electrocautery. Copious irrigation was utilized and was repeatedly aspirated until clear.  The gallbladder and Endocatch sac were then removed through the umbilical port site.  The pursestring suture was used to close the umbilical fascia.    We again inspected the right upper quadrant for hemostasis.  Pneumoperitoneum was released as we removed the trocars.  4-0 Monocryl was used to close the skin.  Skin glue was then applied. The patient was  then extubated and brought to  the recovery room in stable condition. Instrument, sponge, and needle counts were correct at closure and at the conclusion of the case.   Findings: Mild chronic Cholecystitis with Cholelithiasis  Estimated Blood Loss: Minimal         Drains: none         Specimens: Gallbladder           Complications: None; patient tolerated the procedure well.         Disposition: PACU - hemodynamically stable.         Condition: stable

## 2023-08-13 ENCOUNTER — Encounter (HOSPITAL_COMMUNITY): Payer: Self-pay | Admitting: Surgery

## 2023-08-13 DIAGNOSIS — K8010 Calculus of gallbladder with chronic cholecystitis without obstruction: Secondary | ICD-10-CM | POA: Diagnosis not present

## 2023-08-13 LAB — SURGICAL PATHOLOGY

## 2023-08-13 MED ORDER — HYDROCODONE-ACETAMINOPHEN 7.5-325 MG PO TABS: 1.0000 | ORAL_TABLET | Freq: Four times a day (QID) | ORAL | 0 refills | Status: AC | PRN

## 2023-08-13 MED ORDER — MENTHOL 3 MG MT LOZG
1.0000 | LOZENGE | OROMUCOSAL | Status: DC | PRN
  Administered 2023-08-13: 3 mg via ORAL
  Filled 2023-08-13: qty 9

## 2023-08-13 NOTE — Discharge Instructions (Signed)
[facility] ______[facility] LAPAROSCOPIC SURGERY: POST OP INSTRUCTIONS Always review your discharge instruction sheet given to you by the facility where your surgery was performed. IF YOU HAVE DISABILITY OR FAMILY LEAVE FORMS, YOU MUST BRING THEM TO THE OFFICE FOR PROCESSING.   DO NOT GIVE THEM TO YOUR DOCTOR.  A prescription for pain medication may be given to you upon discharge.  Take your pain medication as prescribed, if needed.  If narcotic pain medicine is not needed, then you may take acetaminophen (Tylenol) or ibuprofen (Advil) as needed. Take your usually prescribed medications unless otherwise directed. If you need a refill on your pain medication, please contact your pharmacy.  They will contact our office to request authorization. Prescriptions will not be filled after 5pm or on week-ends. You should follow a light diet the first few days after arrival home, such as soup and crackers, etc.  Be sure to include lots of fluids daily. Most patients will experience some swelling and bruising in the area of the incisions.  Ice packs will help.  Swelling and bruising can take several days to resolve.  It is common to experience some constipation if taking pain medication after surgery.  Increasing fluid intake and taking a stool softener (such as Colace) will usually help or prevent this problem from occurring.  A mild laxative (Milk of Magnesia or Miralax) should be taken according to package instructions if there are no bowel movements after 48 hours. Unless discharge instructions indicate otherwise, you may remove your bandages 24-48 hours after surgery, and you may shower at that time.  You may have steri-strips (small skin tapes) in place directly over the incision.  These strips should be left on the skin for 7-10 days.  If your surgeon used skin glue on the incision, you may shower in 24 hours.  The glue will flake off over the next 2-3 weeks.  Any sutures or staples will be  removed at the office during your follow-up visit. ACTIVITIES:  You may resume regular (light) daily activities beginning the next day--such as daily self-care, walking, climbing stairs--gradually increasing activities as tolerated.  You may have sexual intercourse when it is comfortable.  Refrain from any heavy lifting or straining until approved by your doctor. You may drive when you are no longer taking prescription pain medication, you can comfortably wear a seatbelt, and you can safely maneuver your car and apply brakes. RETURN TO WORK:  __________________________________________________________ Bonita Quin should see your doctor in the office for a follow-up appointment approximately 2-3 weeks after your surgery.  Make sure that you call for this appointment within a day or two after you arrive home to insure a convenient appointment time. OTHER INSTRUCTIONS: YOU MAY SHOWER STARTING TODAY ICE PACK, TYLENOL ALSO FOR PAIN NO LIFTING MORE THAN 15 POUNDS FOR 2 WEEKS __________________________________________________________________________________________________________________________ __________________________________________________________________________________________________________________________ WHEN TO CALL YOUR DOCTOR: Fever over 101.0 Inability to urinate Continued bleeding from incision. Increased pain, redness, or drainage from the incision. Increasing abdominal pain  The clinic staff is available to answer your questions during regular business hours.  Please don't hesitate to call and ask to speak to one of the nurses for clinical concerns.  If you have a medical emergency, go to the nearest emergency room or call 911.  A surgeon from [facility] is always on call at the hospital. [address], [address], [place], [place]  [address] ? [address] [address], [place], [place]   [address] [phone] ? [phone] ? [phone] Web site:  [email or web address]

## 2023-08-13 NOTE — Discharge Summary (Signed)
 Physician Discharge Summary  Patient ID: [name] MRN: [id number] DOB/AGE: 09-14-1960 63 y.o.  Admit date: 08/12/2023 Discharge date: 08/13/2023  Admission Diagnoses:  Discharge Diagnoses:  Principal Problem:   S/P laparoscopic cholecystectomy   Discharged Condition: good  Hospital Course: UNEVENTFUL POST OP RECOVERY.  DISCHARGED HOME POD #1  Consults: None  Significant Diagnostic Studies:   Treatments: surgery: lap chole  Discharge Exam: Blood pressure (!) 100/56, pulse 64, temperature 98.7 F (37.1 C), temperature source Oral, resp. rate 16, height 5\' 5"  (1.651 m), weight 127.5 kg, last menstrual period 06/02/2011, SpO2 99%. General appearance: alert, cooperative, and no distress Resp: clear to auscultation bilaterally Cardio: regular rate and rhythm, S1, S2 normal, no murmur, click, rub or gallop Incision/Wound: abdomen soft, incisions clean  Disposition: Discharge disposition: 01-Home or Self Care        Allergies as of 08/13/2023       Reactions   Beta Adrenergic Blockers Other (See Comments)   RED EYES AND CONGESTION   Sulfa Antibiotics Hives   RED EYES/RED CHEST   Thimerosal (thiomersal) Hives, Itching        Medication List     STOP taking these medications    pyridOXINE  100 MG tablet Commonly known as: VITAMIN B6       TAKE these medications    acetaminophen  500 MG tablet Commonly known as: TYLENOL  Take 1,000 mg by mouth daily as needed (pain.).   allopurinol 100 MG tablet Commonly known as: ZYLOPRIM Take 100 mg by mouth daily.   azelastine  0.1 % nasal spray Commonly known as: ASTELIN  USE 2 SPRAYS IN EACH NOSTRIL TWICE DAILY AS NEEDED(NASAL DRAINAGE) AS DIRECTED   brinzolamide  1 % ophthalmic suspension Commonly known as: AZOPT  Place 1 drop into both eyes 3 (three) times daily.   CALTRATE 600+D PLUS MINERALS PO Take 2 tablets by mouth 3 (three) times daily as needed (bone health).   colchicine  0.6 MG tablet Take 0.6 mg  by mouth daily.   Combigan  0.2-0.5 % ophthalmic solution Generic drug: brimonidine -timolol  Place 1 drop into both eyes 3 (three) times daily.   DHA PO Take 2 tablets by mouth at bedtime. DHA Nordic Naturals   diltiazem  240 MG 24 hr capsule Commonly known as: CARDIZEM  CD Take 240 mg by mouth at bedtime.   Dupixent  300 MG/2ML prefilled syringe Generic drug: dupilumab  INJECT 1 SYRINGE UNDER THE SKIN EVERY 14 DAYS   fexofenadine 180 MG tablet Commonly known as: ALLEGRA Take 180 mg by mouth daily as needed for allergies.   furosemide 40 MG tablet Commonly known as: LASIX Take 40 mg by mouth daily as needed (fluid retention).   gabapentin  300 MG capsule Commonly known as: NEURONTIN  Take 300 mg by mouth 3 (three) times daily as needed (Pain).   HYDROcodone -acetaminophen  7.5-325 MG tablet Commonly known as: NORCO Take 1 tablet by mouth every 6 (six) hours as needed for moderate pain (pain score 4-6).   Latanoprostene Bunod 0.024 % Soln Place 1 drop into both eyes daily.   levalbuterol  45 MCG/ACT inhaler Commonly known as: XOPENEX  HFA Inhale 2 puffs into the lungs every 4 (four) hours as needed.   levocetirizine 5 MG tablet Commonly known as: XYZAL  Take 5 mg by mouth every evening.   magnesium  chloride 64 MG Tbec SR tablet Commonly known as: SLOW-MAG Take 1-2 tablets by mouth See admin instructions. Take 2 tablets by mouth every morning and 1 tablet by mouth with dinner   mometasone  0.1 % ointment Commonly known as: ELOCON   Apply topically daily as needed (Itchy rash).   mometasone  50 MCG/ACT nasal spray Commonly known as: NASONEX  USE 2 SPRAYS IN EACH NOSTRIL DAILY What changed:  how much to take how to take this when to take this reasons to take this additional instructions   montelukast  10 MG tablet Commonly known as: SINGULAIR  Take 1 tablet (10 mg total) by mouth at bedtime.   multivitamin with minerals Tabs tablet Take 2 tablets by mouth every evening.    NETI POT SINUS WASH NA Place 1 application into the nose daily as needed (congestion).   Olopatadine  HCl 0.2 % Soln Apply 1 drop to eye daily as needed (Itchy, watery eyes).   omeprazole  20 MG capsule Commonly known as: PRILOSEC TAKE 1 CAPSULE(20 MG) BY MOUTH DAILY   Ozempic  (1 MG/DOSE) 4 MG/3ML Sopn Generic drug: Semaglutide  (1 MG/DOSE) Inject 1 mg into the skin once a week.   potassium chloride  SA 20 MEQ tablet Commonly known as: KLOR-CON  M Take 20-40 mEq by mouth See admin instructions. Take 2 tablets (40 meq) by mouth with breakfast and 1 tablet (20 meq) by mouth with dinner   Propylene Glycol 0.6 % Soln Place 1 drop into both eyes 3 (three) times daily as needed (Dry eye).   quiNINE  324 MG capsule Commonly known as: QUALAQUIN  Take 324 mg by mouth at bedtime.   Rhopressa 0.02 % Soln Generic drug: Netarsudil  Dimesylate Place 1 drop into both eyes in the morning.   rivaroxaban  20 MG Tabs tablet Commonly known as: XARELTO  Take 20 mg by mouth at bedtime.   Trelegy Ellipta  200-62.5-25 MCG/ACT Aepb Generic drug: Fluticasone -Umeclidin-Vilant Inhale 1 puff into the lungs daily.   valsartan -hydrochlorothiazide  80-12.5 MG tablet Commonly known as: DIOVAN -HCT Take 1 tablet by mouth in the morning.   Vitamin B-12 2500 MCG Subl Place 5,000 mcg under the tongue at bedtime.   vitamin C  1000 MG tablet Take 1,000 mg by mouth daily as needed (immune support). Emergen C   Vitamin D  (Ergocalciferol ) 1.25 MG (50000 UNIT) Caps capsule Commonly known as: DRISDOL  Take 50,000 Units by mouth every 14 (fourteen) days.   zinc sulfate (50mg  elemental zinc) 220 (50 Zn) MG capsule Take 220 mg by mouth in the morning. Mid morning        Follow-up Information     Oza Blumenthal, MD. Schedule an appointment as soon as possible for a visit in 3 week(s).   Specialty: General Surgery Contact information: [address] [place] [address] [phone]                  Signed: Oza Blumenthal 08/13/2023, 7:44 AM

## 2023-08-13 NOTE — Progress Notes (Signed)
 Reviewed AVS, patient expressed understanding of medications, MD follow up reviewed.   Removed IV, Site clean, dry and intact.  Patient states all belongings brought to the hospital at time of admission are accounted for and packed to take home.   Patient has requested to take a shower before discharge. Family in room to transport patient home when she is finished taking shower.

## 2023-08-24 ENCOUNTER — Other Ambulatory Visit: Payer: Self-pay | Admitting: Allergy

## 2023-09-01 ENCOUNTER — Encounter: Payer: Self-pay | Admitting: Allergy

## 2023-09-01 ENCOUNTER — Ambulatory Visit: Payer: BC Managed Care – PPO | Admitting: Allergy

## 2023-09-01 ENCOUNTER — Other Ambulatory Visit: Payer: Self-pay

## 2023-09-01 VITALS — BP 114/66 | HR 72 | Temp 97.8°F | Ht 64.57 in | Wt 270.7 lb

## 2023-09-01 DIAGNOSIS — K219 Gastro-esophageal reflux disease without esophagitis: Secondary | ICD-10-CM

## 2023-09-01 DIAGNOSIS — J3089 Other allergic rhinitis: Secondary | ICD-10-CM

## 2023-09-01 DIAGNOSIS — L309 Dermatitis, unspecified: Secondary | ICD-10-CM

## 2023-09-01 DIAGNOSIS — H1013 Acute atopic conjunctivitis, bilateral: Secondary | ICD-10-CM | POA: Diagnosis not present

## 2023-09-01 DIAGNOSIS — J4540 Moderate persistent asthma, uncomplicated: Secondary | ICD-10-CM

## 2023-09-01 MED ORDER — LEVALBUTEROL TARTRATE 45 MCG/ACT IN AERO: 2.0000 | INHALATION_SPRAY | RESPIRATORY_TRACT | 1 refills | Status: AC | PRN

## 2023-09-01 MED ORDER — AZELASTINE HCL 0.1 % NA SOLN: NASAL | 5 refills | Status: AC

## 2023-09-01 MED ORDER — MONTELUKAST SODIUM 10 MG PO TABS: 10.0000 mg | ORAL_TABLET | Freq: Every day | ORAL | 5 refills | Status: DC

## 2023-09-01 MED ORDER — MOMETASONE FUROATE 50 MCG/ACT NA SUSP: NASAL | 5 refills | Status: AC

## 2023-09-01 NOTE — Progress Notes (Signed)
 Follow-up Note  RE: [name] MRN: [id number] DOB: [birth year] Date of Office Visit: 09/01/2023   History of present illness: [name] is a 63 y.o. female presenting today for follow-up of asthma, allergic rhinitis, reflux and has in past had issues with nosebleeds and itching.  She was last seen in the office on 03/03/23 by myself.  Discussed the use of AI scribe software for clinical note transcription with the patient, who gave verbal consent to proceed.  She underwent a cholecystectomy three weeks ago due to longstanding stomach problems that intensified over the past five years. The surgery was not performed for an acute emergency. She is still recovering, particularly noting discomfort around the incision near her navel. Returning to work too early exacerbated the discomfort. She has altered her diet significantly since the gallbladder removal and currently has been eating mostly bland foods..  Regarding her asthma, she has not needed to use her rescue inhaler since November and continues to use Dupixent , administered in her thigh or abdomen-she does rotate her sites, and Trelegy once daily. Her breathing has been stable, and she reports no issues post-surgery.  She experiences severe allergies, particularly affecting her eyes, and uses olopatadine  eye drops, which she finds helpful. She takes half a tablet of levocetirizine, which she finds effective, and uses a nasal spray as needed for congestion. She reports improvement in previous epistaxis and itching, the latter managed with a Opzelura sample she received, which she has now run out of.     Review of systems: 10pt ROS negative unless noted above in HPI  Past medical/social/surgical/family history have been reviewed and are unchanged unless specifically indicated below.  No changes  Medication List: Current Outpatient Medications  Medication Sig Dispense Refill   acetaminophen  (TYLENOL ) 500 MG tablet Take 1,000 mg  by mouth daily as needed (pain.).     allopurinol (ZYLOPRIM) 100 MG tablet Take 100 mg by mouth daily.     Ascorbic Acid  (VITAMIN C ) 1000 MG tablet Take 1,000 mg by mouth daily as needed (immune support). Emergen C     brinzolamide  (AZOPT ) 1 % ophthalmic suspension Place 1 drop into both eyes 3 (three) times daily.     Calcium Carbonate-Vit D-Min (CALTRATE 600+D PLUS MINERALS PO) Take 2 tablets by mouth 3 (three) times daily as needed (bone health).     colchicine  0.6 MG tablet Take 0.6 mg by mouth daily.     COMBIGAN  0.2-0.5 % ophthalmic solution Place 1 drop into both eyes 3 (three) times daily.     Cyanocobalamin  (VITAMIN B-12) 2500 MCG SUBL Place 5,000 mcg under the tongue at bedtime.     diltiazem  (CARDIZEM  CD) 240 MG 24 hr capsule Take 240 mg by mouth at bedtime.     Docosahexaenoic Acid (DHA PO) Take 2 tablets by mouth at bedtime. DHA Nordic Naturals     dupilumab  (DUPIXENT ) 300 MG/2ML prefilled syringe INJECT 1 SYRINGE UNDER THE SKIN EVERY 14 DAYS 4 mL 11   fexofenadine (ALLEGRA) 180 MG tablet Take 180 mg by mouth daily as needed for allergies.     furosemide (LASIX) 40 MG tablet Take 40 mg by mouth daily as needed (fluid retention).     gabapentin  (NEURONTIN ) 300 MG capsule Take 300 mg by mouth 3 (three) times daily as needed (Pain).     HYDROcodone -acetaminophen  (NORCO) 7.5-325 MG tablet Take 1 tablet by mouth every 6 (six) hours as needed for moderate pain (pain score 4-6). 30 tablet 0  Latanoprostene Bunod 0.024 % SOLN Place 1 drop into both eyes daily.     levocetirizine (XYZAL ) 5 MG tablet Take 5 mg by mouth every evening.     magnesium  chloride (SLOW-MAG) 64 MG TBEC Take 1-2 tablets by mouth See admin instructions. Take 2 tablets by mouth every morning and 1 tablet by mouth with dinner     mometasone  (ELOCON ) 0.1 % ointment Apply topically daily as needed (Itchy rash). 45 g 5   Multiple Vitamin (MULTIVITAMIN WITH MINERALS) TABS tablet Take 2 tablets by mouth every evening.      Olopatadine  HCl 0.2 % SOLN Apply 1 drop to eye daily as needed (Itchy, watery eyes). 2.5 mL 5   omeprazole  (PRILOSEC) 20 MG capsule TAKE 1 CAPSULE(20 MG) BY MOUTH DAILY 90 capsule 0   potassium chloride  SA (K-DUR,KLOR-CON ) 20 MEQ tablet Take 20-40 mEq by mouth See admin instructions. Take 2 tablets (40 meq) by mouth with breakfast and 1 tablet (20 meq) by mouth with dinner     Propylene Glycol 0.6 % SOLN Place 1 drop into both eyes 3 (three) times daily as needed (Dry eye).     quiNINE  (QUALAQUIN ) 324 MG capsule Take 324 mg by mouth at bedtime.      RHOPRESSA 0.02 % SOLN Place 1 drop into both eyes in the morning.     rivaroxaban  (XARELTO ) 20 MG TABS tablet Take 20 mg by mouth at bedtime.      Semaglutide , 1 MG/DOSE, (OZEMPIC , 1 MG/DOSE,) 4 MG/3ML SOPN Inject 1 mg into the skin once a week. 3 mL 0   Sodium Chloride -Sodium Bicarb (NETI POT SINUS WASH NA) Place 1 application into the nose daily as needed (congestion).     TRELEGY ELLIPTA  200-62.5-25 MCG/ACT AEPB INHALE 1 PUFF INTO THE LUNGS DAILY 60 each 2   valsartan -hydrochlorothiazide  (DIOVAN -HCT) 80-12.5 MG per tablet Take 1 tablet by mouth in the morning.     Vitamin D , Ergocalciferol , (DRISDOL ) 50000 UNITS CAPS capsule Take 50,000 Units by mouth every 14 (fourteen) days.     zinc sulfate 220 (50 Zn) MG capsule Take 220 mg by mouth in the morning. Mid morning     azelastine  (ASTELIN ) 0.1 % nasal spray USE 2 SPRAYS IN EACH NOSTRIL TWICE DAILY AS NEEDED(NASAL DRAINAGE) AS DIRECTED 30 mL 5   levalbuterol  (XOPENEX  HFA) 45 MCG/ACT inhaler Inhale 2 puffs into the lungs every 4 (four) hours as needed. 15 g 1   mometasone  (NASONEX ) 50 MCG/ACT nasal spray USE 2 SPRAYS IN EACH NOSTRIL DAILY 17 g 5   montelukast  (SINGULAIR ) 10 MG tablet Take 1 tablet (10 mg total) by mouth at bedtime. 30 tablet 5   No current facility-administered medications for this visit.     Known medication allergies: Allergies  Allergen Reactions   Beta Adrenergic Blockers  Other (See Comments)    RED EYES AND CONGESTION   Sulfa Antibiotics Hives    RED EYES/RED CHEST   Thimerosal (Thiomersal) Hives and Itching     Physical examination: Blood pressure 114/66, pulse 72, temperature 97.8 F (36.6 C), temperature source Temporal, height 5' 4.57" (1.64 m), weight 270 lb 11.2 oz (122.8 kg), last menstrual period 06/02/2011, SpO2 100%.  General: Alert, interactive, in no acute distress. HEENT: PERRLA, TMs pearly gray, turbinates non-edematous without discharge, post-pharynx non erythematous. Neck: Supple without lymphadenopathy. Lungs: Clear to auscultation without wheezing, rhonchi or rales. {no increased work of breathing. CV: Normal S1, S2 without murmurs. Abdomen: Nondistended, nontender. Skin: Warm and dry, without lesions or  rashes. Extremities:  No clubbing, cyanosis or edema. Neuro:   Grossly intact.  Diagnostics/Labs:  Spirometry: FEV1: 1.64L 80%, FVC: 2.1L 81%, ratio consistent with nonobstructive pattern  Assessment and plan: Asthma - do very well - will try to wean dose to see if you can stay controlled at a lower inhaler dose.  Use Trelegy 100mcg 1 puff daily (samples provided for month supply).  Let me know if you have not had any increase in symptoms or albuterol  needs at Trelegy 100 and then will send prescription.  Rinse mouth after use Holding Trelegy 200mcg inhaler for now.   - continue Singulair  10mg  daily - have access to Xopenex  inhaler 2 puffs every 4-6 hours as needed for cough/wheeze/shortness of breath/chest tightness.  May use 15-20 minutes prior to activity.   Monitor frequency of use.   - continue Dupixent  injections every 2 weeks self-administered. Alternate your injection sites.   Use your thighs while abdomen is healing from surgery  Asthma control goals:  Full participation in all desired activities (may need albuterol  before activity) Albuterol  use two time or less a week on average (not counting use with activity) Cough  interfering with sleep two time or less a month Oral steroids no more than once a year No hospitalizations  Allergic rhinitis with conjunctivitis  - continue your allergen avoidance measures  - use levocetirizine 1/2 tab daily  - for itchy/watery eyes can continue use of Olopatadine  1 drop each eye daily as needed  - for nasal congestion/stuffiness use of Nasonex  or Nasacort 1-2 sprays each nostril daily as needed.  This is nasal steroid spray.  - for nasal drainage/post-nasal drip use Astelin  2 sprays each nostril 2 times a day for symptom control  - use nasal saline rinse twice a day at this time. Use saline rinse kit prior to nasal spray use.  Use distilled water or boil water and bring to room temperature (do not use tap water).   Reflux  - reflux symptoms can be triggers of asthma symptoms  - use Omeprazole  as needed for reflux control  Itching   - use Opzelura thin layer to areas that itch twice a day if needed.  This is a non-steroid ointment that can be use anywhere on body if needed  - use Elocon  ointment daily if needed for itchy rash.  This is a steroid ointment.   Follow-up 6 months or sooner if needed  I appreciate the opportunity to take part in [name]'s care. Please do not hesitate to contact me with questions.  Sincerely,   Catha Clink, MD Allergy/Immunology [facility]

## 2023-09-01 NOTE — Patient Instructions (Addendum)
 Asthma - do very well - will try to wean dose to see if you can stay controlled at a lower inhaler dose.  Use Trelegy 100mcg 1 puff daily (samples provided for month supply).  Let me know if you have not had any increase in symptoms or albuterol  needs at Trelegy 100 and then will send prescription.  Rinse mouth after use Holding Trelegy 200mcg inhaler for now.   - continue Singulair  10mg  daily - have access to Xopenex  inhaler 2 puffs every 4-6 hours as needed for cough/wheeze/shortness of breath/chest tightness.  May use 15-20 minutes prior to activity.   Monitor frequency of use.   - continue Dupixent  injections every 2 weeks self-administered. Alternate your injection sites.   Use your thighs while abdomen is healing from surgery  Asthma control goals:  Full participation in all desired activities (may need albuterol  before activity) Albuterol  use two time or less a week on average (not counting use with activity) Cough interfering with sleep two time or less a month Oral steroids no more than once a year No hospitalizations  Allergies   - continue your allergen avoidance measures  - use levocetirizine 1/2 tab daily  - for itchy/watery eyes can continue use of Olopatadine  1 drop each eye daily as needed  - for nasal congestion/stuffiness use of Nasonex  or Nasacort 1-2 sprays each nostril daily as needed.  This is nasal steroid spray.  - for nasal drainage/post-nasal drip use Astelin  2 sprays each nostril 2 times a day for symptom control  - use nasal saline rinse twice a day at this time. Use saline rinse kit prior to nasal spray use.  Use distilled water or boil water and bring to room temperature (do not use tap water).   Reflux  - reflux symptoms can be triggers of asthma symptoms  - use Omeprazole  as needed for reflux control  Itching   - use Opzelura thin layer to areas that itch twice a day if needed.  This is a non-steroid ointment that can be use anywhere on body if needed  -  use Elocon  ointment daily if needed for itchy rash.  This is a steroid ointment.   Follow-up 6 months or sooner if needed

## 2023-09-02 ENCOUNTER — Encounter: Payer: Self-pay | Admitting: Family Medicine

## 2023-09-02 ENCOUNTER — Ambulatory Visit: Admitting: Family Medicine

## 2023-09-02 VITALS — BP 129/79 | HR 63 | Temp 97.6°F | Ht 64.5 in | Wt 268.0 lb

## 2023-09-02 DIAGNOSIS — Z9049 Acquired absence of other specified parts of digestive tract: Secondary | ICD-10-CM | POA: Diagnosis not present

## 2023-09-02 DIAGNOSIS — E119 Type 2 diabetes mellitus without complications: Secondary | ICD-10-CM | POA: Diagnosis not present

## 2023-09-02 DIAGNOSIS — E66813 Obesity, class 3: Secondary | ICD-10-CM | POA: Diagnosis not present

## 2023-09-02 DIAGNOSIS — Z9884 Bariatric surgery status: Secondary | ICD-10-CM

## 2023-09-02 DIAGNOSIS — Z7289 Other problems related to lifestyle: Secondary | ICD-10-CM | POA: Diagnosis not present

## 2023-09-02 DIAGNOSIS — Z7985 Long-term (current) use of injectable non-insulin antidiabetic drugs: Secondary | ICD-10-CM

## 2023-09-02 DIAGNOSIS — Z9189 Other specified personal risk factors, not elsewhere classified: Secondary | ICD-10-CM

## 2023-09-02 DIAGNOSIS — Z6841 Body Mass Index (BMI) 40.0 and over, adult: Secondary | ICD-10-CM

## 2023-09-02 MED ORDER — SEMAGLUTIDE (2 MG/DOSE) 8 MG/3ML ~~LOC~~ SOPN
2.0000 mg | PEN_INJECTOR | SUBCUTANEOUS | 0 refills | Status: DC
Start: 2023-09-02 — End: 2023-09-30

## 2023-09-02 NOTE — Progress Notes (Signed)
 Office: [phone]  /  Fax: [phone]  WEIGHT SUMMARY AND BIOMETRICS  Starting Date: 01/27/23  Starting Weight: 300lb   Weight Lost Since Last Visit: 9lb   Vitals Temp: 97.6 F (36.4 C) BP: 129/79 Pulse Rate: 63 SpO2: 99 %   Body Composition  Body Fat %: 58 % Fat Mass (lbs): 156 lbs Muscle Mass (lbs): 107 lbs Visceral Fat Rating : 21   HPI  Chief Complaint: OBESITY  [name] is here to discuss her progress with her obesity treatment plan. She is on the the Category 3 Plan and states she is following her eating plan approximately 50 % of the time. She states she is exercising 0 minutes 0 times per week.  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 Body mass index is 45.29 kg/m.  General: She is overweight, cooperative, alert, well developed, and in no acute distress. PSYCH: Has normal mood, affect and thought process.   Lungs: Normal breathing effort, no conversational dyspnea.   ASSESSMENT AND PLAN  TREATMENT PLAN FOR OBESITY:  Recommended Dietary Goals  [name] is currently in the action stage of change. As such, her goal is to continue weight management plan. She has agreed to the Category 3 Plan.  Behavioral Intervention  We discussed the following Behavioral Modification Strategies today: increasing  lean protein intake to established goals, increasing fiber rich foods, increasing water intake , work on meal planning and preparation, keeping healthy foods at home, identifying sources and decreasing liquid calories, avoiding temptations and identifying enticing environmental cues, continue to practice mindfulness when eating, planning for success, and continue to work on maintaining a reduced calorie state, getting the recommended amount of protein, incorporating whole foods, making healthy choices, staying well hydrated and practicing mindfulness when eating..  Additional resources provided today: NA  Recommended Physical Activity Goals  [name] has been advised to work up to 150 minutes of moderate intensity aerobic activity a week and strengthening exercises 2-3 times per week for cardiovascular health, weight loss maintenance and preservation of muscle mass.   She has agreed to Think about enjoyable ways to increase daily physical activity and overcoming barriers to exercise and Increase physical activity in their day and reduce sedentary time (increase NEAT). Await surgical OK prior to restarting exercise  Pharmacotherapy changes for the treatment of obesity: increase Ozempic  to 2 mg weekly  ASSOCIATED CONDITIONS ADDRESSED TODAY  Weight gain following gastric bypass surgery Improving She has lost 32 lb in 7 mos of medically supervised weight management  Continue cat 3 meal plan + Ozempic  Prioritize lean protein and water intake  Diet-controlled diabetes mellitus (HCC) -     Semaglutide  (2 MG/DOSE); Inject 2 mg as directed once a week.  Dispense: 3 mL; Refill: 0 Doing well and has good control  of glucose/ A1c on Ozempic  1 mg weekly She is not feeling as much satiety and denies GI upset or meal skipping  Increase Ozempic  to 2 mg weekly Continue routine diabetes management with PCP  Class 3 severe obesity due to excess calories with body mass index (BMI) of 45.0 to 49.9 in  adult  Sedentary lifestyle Unchanged  S/P laparoscopic cholecystectomy Doing well with mild umbilical soreness post cholecystectomy 3 weeks ago She is back at work and has had only one episode of diarrhea  Reminded her to avoid high fat/ greasy/rich foods and excess sugar  Keep f/u with Dr Lucienne Ryder 5/21     She was informed of the importance of frequent follow up visits to maximize her success with intensive lifestyle modifications for her multiple health conditions.   ATTESTASTION STATEMENTS:  Reviewed by clinician on day of visit: allergies, medications, problem list, medical history, surgical history, family history, social history, and previous encounter notes pertinent to obesity diagnosis.   I have personally spent 30 minutes total time today in preparation, patient care, nutritional counseling and education,  and documentation for this visit, including the following: review of most recent clinical lab tests, prescribing medications/ refilling medications, reviewing medical assistant documentation, review and interpretation of bioimpedence results.     Micky Albee, D.O. DABFM, DABOM [facility] [address] [place], [place] [address] [phone]

## 2023-09-25 ENCOUNTER — Other Ambulatory Visit: Payer: Self-pay | Admitting: Allergy

## 2023-09-30 ENCOUNTER — Encounter: Payer: Self-pay | Admitting: Nurse Practitioner

## 2023-09-30 ENCOUNTER — Ambulatory Visit: Admitting: Nurse Practitioner

## 2023-09-30 VITALS — BP 114/62 | HR 62 | Temp 97.9°F | Ht 64.5 in | Wt 268.0 lb

## 2023-09-30 DIAGNOSIS — Z6841 Body Mass Index (BMI) 40.0 and over, adult: Secondary | ICD-10-CM

## 2023-09-30 DIAGNOSIS — E119 Type 2 diabetes mellitus without complications: Secondary | ICD-10-CM

## 2023-09-30 DIAGNOSIS — E66813 Obesity, class 3: Secondary | ICD-10-CM

## 2023-09-30 DIAGNOSIS — Z7985 Long-term (current) use of injectable non-insulin antidiabetic drugs: Secondary | ICD-10-CM | POA: Diagnosis not present

## 2023-09-30 MED ORDER — SEMAGLUTIDE (2 MG/DOSE) 8 MG/3ML ~~LOC~~ SOPN: 2.0000 mg | PEN_INJECTOR | SUBCUTANEOUS | 0 refills | Status: DC

## 2023-09-30 NOTE — Progress Notes (Signed)
 Office: [phone]  /  Fax: [phone]  WEIGHT SUMMARY AND BIOMETRICS  Weight Lost Since Last Visit: 0lb  Weight Gained Since Last Visit: 0lb   Vitals Temp: 97.9 F (36.6 C) BP: 114/62 Pulse Rate: 62 SpO2: 100 %   Anthropometric Measurements Height: 5' 4.5 (1.638 m) Weight: 268 lb (121.6 kg) BMI (Calculated): 45.31 Weight at Last Visit: 268lb Weight Lost Since Last Visit: 0lb Weight Gained Since Last Visit: 0lb Starting Weight: 300lb Total Weight Loss (lbs): 32 lb (14.5 kg)   Body Composition  Body Fat %: 52.1 % Fat Mass (lbs): 140 lbs Muscle Mass (lbs): 122.4 lbs Visceral Fat Rating : 19   Other Clinical Data Fasting: No Labs: No Today's Visit #: 10 Starting Date: 01/27/23     HPI  Chief Complaint: OBESITY  [name] is here to discuss her progress with her obesity treatment plan. She is on the the Category 3 Plan and states she is following her eating plan approximately 50 % of the time. She states she is exercising 0 minutes 0 days per week.   Interval History:  Since last office visit she has maintained her weight.  She had her last post op check up on 09/08/23.  Since surgery she notes some diarrhea, every 2 weeks, based upon what she eats chicken with barbeque sauce and watermelon lemonda..  If she eats something bland she doesn't have problems diarrhea. She is drinking water daily but not enough and occ ginger ale.  She was told she could start exercising 6-8 weeks after her surgery.  She is still experiencing some soreness at the incision site.   BF:  2 eggs, 2 [place] bacon, 1 slice toast and sometimes apple sauce or a protein shake Snack:  yogurt  Lunch:  chicken legs, green beans, yams or [place] legs, greens or chicken wings Snack:  none Dinner:  left overs from lunch  Pharmacotherapy for weight loss: She is not currently taking medications  for medical weight loss.       Bariatric surgery:  She is status post RYGB 2010 with lap band over  bypass in 2015 with Dr. Gaylyn Keas.  Her highest weight prior to surgery was 455 pounds and her nadir weight after surgery was 240s.  She is taking Multivitamin, Vit B12 every day and Vit D every 2 weeks.  She reports no restriction.     Pharmacotherapy for DMT2:  She is currently taking Ozempic  2mg .  Increased after her last office visit.  Denies side effects.   Last A1c was 5.4 She is not checking BS at home.   Episodes of hypoglycemia: no On ARB. Taking Xarelto  for history of DVT/PE.  Denies history of CVA.   Last eye exam:  Dec 2024    Lab Results  Component Value Date   HGBA1C 5.4 08/04/2023   HGBA1C 5.5 01/27/2023   HGBA1C 5.5 11/04/2017   Lab Results  Component Value Date   LDLCALC 99 01/27/2023   CREATININE 1.18 (H) 08/04/2023      PHYSICAL EXAM:  Blood pressure 114/62, pulse 62, temperature 97.9 F (36.6 C), height 5' 4.5 (1.638 m), weight 268 lb (121.6 kg), last menstrual period 06/02/2011, SpO2 100%. Body mass index is 45.29 kg/m.  General: She is overweight, cooperative, alert, well developed, and in no acute distress. PSYCH: Has normal mood, affect and thought process.   Extremities: No edema.  Neurologic: No gross sensory or motor deficits. No tremors or fasciculations noted.    DIAGNOSTIC DATA REVIEWED:  BMET    Component Value Date/Time   NA 142 08/04/2023 1255   NA 144 01/27/2023 1041   K 3.6 08/04/2023 1255   CL 107 08/04/2023 1255   CO2 25 08/04/2023 1255   GLUCOSE 82 08/04/2023 1255   BUN 10 08/04/2023 1255   BUN 11 01/27/2023 1041   CREATININE 1.18 (H) 08/04/2023 1255   CALCIUM 9.4 08/04/2023 1255   GFRNONAA 52 (L) 08/04/2023 1255   GFRAA 62 11/04/2017 0943   Lab Results  Component Value Date   HGBA1C 5.4 08/04/2023   HGBA1C (H) 08/09/2007    10.9 (NOTE)   The ADA recommends the following therapeutic goals for glycemic   control related to Hgb A1C measurement:   Goal of Therapy:   < 7.0% Hgb A1C   Action Suggested:  > 8.0% Hgb A1C   Ref:   Diabetes Care, 22, Suppl. 1, 1999   Lab Results  Component Value Date   INSULIN  25.1 (H) 01/27/2023   INSULIN  15.2 11/04/2017   Lab Results  Component Value Date   TSH 2.960 01/27/2023   CBC    Component Value Date/Time   WBC 5.3 08/04/2023 1255   RBC 4.95 08/04/2023 1255   HGB 14.0 08/04/2023 1255   HGB 13.8 01/27/2023 1041   HGB 13.1 12/20/2007 1044   HCT 41.7 08/04/2023 1255   HCT 43.3 01/27/2023 1041   HCT 39.1 12/20/2007 1044   PLT 150 08/04/2023 1255   PLT 149 (L) 01/27/2023 1041   MCV 84.2 08/04/2023 1255   MCV 86 01/27/2023 1041   MCV 84.3 12/20/2007 1044   MCH 28.3 08/04/2023 1255   MCHC 33.6 08/04/2023 1255   RDW 14.5 08/04/2023 1255   RDW 14.1 01/27/2023 1041   RDW 15.6 (H) 12/20/2007 1044   Iron  Studies    Component Value Date/Time   IRON  91 01/27/2023 1041   TIBC 301 01/27/2023 1041   FERRITIN 30 01/27/2023 1041   IRONPCTSAT 30 01/27/2023 1041   Lipid Panel     Component Value Date/Time   CHOL 169 01/27/2023 1041   TRIG 79 01/27/2023 1041   HDL 55 01/27/2023 1041   CHOLHDL 3.1 01/27/2023 1041   CHOLHDL 5.9 08/09/2007 0039   VLDL 32 08/09/2007 0039   LDLCALC 99 01/27/2023 1041   Hepatic Function Panel     Component Value Date/Time   PROT 6.6 01/27/2023 1041   ALBUMIN 3.8 (L) 01/27/2023 1041   AST 16 01/27/2023 1041   ALT 23 01/27/2023 1041   ALKPHOS 94 01/27/2023 1041   BILITOT 0.4 01/27/2023 1041      Component Value Date/Time   TSH 2.960 01/27/2023 1041   Nutritional Lab Results  Component Value Date   VD25OH 83.8 01/27/2023   VD25OH 51.7 11/04/2017     ASSESSMENT AND PLAN  TREATMENT PLAN FOR OBESITY:  Recommended Dietary Goals  [name] is currently in the action stage of change. As such, her goal is to continue weight management plan. She has agreed to the Category 3 Plan.  Behavioral Intervention  We discussed the following Behavioral Modification Strategies today: increasing lean protein intake to established goals,  decreasing simple carbohydrates , increasing vegetables, increasing fiber rich foods, increasing water intake , and continue to work on maintaining a reduced calorie state, getting the recommended amount of protein, incorporating whole foods, making healthy choices, staying well hydrated and practicing mindfulness when eating..  Additional resources provided today: NA  Recommended Physical Activity Goals  Bobbe has been advised to work  up to 150 minutes of moderate intensity aerobic activity a week and strengthening exercises 2-3 times per week for cardiovascular health, weight loss maintenance and preservation of muscle mass.   She has agreed to start exercising per surgeons recommendations.    ASSOCIATED CONDITIONS ADDRESSED TODAY  Action/Plan  Diet-controlled diabetes mellitus (HCC) -     Semaglutide  (2 MG/DOSE); Inject 2 mg as directed once a week.  Dispense: 3 mL; Refill: 0  Class 3 severe obesity due to excess calories with body mass index (BMI) of 45.0 to 49.9 in adult     Reminded her to avoid high fat/ greasy/rich foods and excess sugar     Return in about 4 weeks (around 10/28/2023).Aaron Aas She was informed of the importance of frequent follow up visits to maximize her success with intensive lifestyle modifications for her multiple health conditions.   ATTESTASTION STATEMENTS:  Reviewed by clinician on day of visit: allergies, medications, problem list, medical history, surgical history, family history, social history, and previous encounter notes.     Crist Dominion. Christean Silvestri FNP-C

## 2023-10-12 ENCOUNTER — Telehealth: Payer: Self-pay | Admitting: Allergy

## 2023-10-12 NOTE — Telephone Encounter (Signed)
 Patient tried the Trelegy 100 sample and would like it sent into her pharmacy. She was told to inform Dr. Jeneal, please advise on request.

## 2023-10-12 NOTE — Telephone Encounter (Signed)
 [name] called and stated that per Dr. Jeneal she was to try samples of Trelegy 100mg , and let her know how it worked for her. She states that she did well on this medication, and would like to have it prescribed/called in for her. Best contact [phone]

## 2023-10-13 MED ORDER — TRELEGY ELLIPTA 100-62.5-25 MCG/ACT IN AEPB: 1.0000 | INHALATION_SPRAY | Freq: Every day | RESPIRATORY_TRACT | 5 refills | Status: AC

## 2023-10-13 NOTE — Telephone Encounter (Signed)
 Medication has been sent in and the patient has been notified.

## 2023-11-03 ENCOUNTER — Other Ambulatory Visit: Payer: Self-pay | Admitting: Nurse Practitioner

## 2023-11-03 DIAGNOSIS — E119 Type 2 diabetes mellitus without complications: Secondary | ICD-10-CM

## 2023-11-11 ENCOUNTER — Encounter: Payer: Self-pay | Admitting: Nurse Practitioner

## 2023-11-11 ENCOUNTER — Ambulatory Visit: Admitting: Nurse Practitioner

## 2023-11-11 VITALS — BP 114/73 | HR 60 | Temp 97.8°F | Ht 64.5 in | Wt 261.0 lb

## 2023-11-11 DIAGNOSIS — Z7985 Long-term (current) use of injectable non-insulin antidiabetic drugs: Secondary | ICD-10-CM | POA: Diagnosis not present

## 2023-11-11 DIAGNOSIS — Z6841 Body Mass Index (BMI) 40.0 and over, adult: Secondary | ICD-10-CM | POA: Diagnosis not present

## 2023-11-11 DIAGNOSIS — E66813 Obesity, class 3: Secondary | ICD-10-CM | POA: Diagnosis not present

## 2023-11-11 DIAGNOSIS — E119 Type 2 diabetes mellitus without complications: Secondary | ICD-10-CM | POA: Diagnosis not present

## 2023-11-11 MED ORDER — SEMAGLUTIDE (2 MG/DOSE) 8 MG/3ML ~~LOC~~ SOPN
2.0000 mg | PEN_INJECTOR | SUBCUTANEOUS | 0 refills | Status: DC
Start: 2023-11-11 — End: 2023-11-11

## 2023-11-11 MED ORDER — SEMAGLUTIDE (2 MG/DOSE) 8 MG/3ML ~~LOC~~ SOPN
2.0000 mg | PEN_INJECTOR | SUBCUTANEOUS | 1 refills | Status: DC
Start: 2023-11-11 — End: 2024-01-10

## 2023-11-11 NOTE — Progress Notes (Signed)
 Office: [phone]  /  Fax: [phone]  WEIGHT SUMMARY AND BIOMETRICS  Weight Lost Since Last Visit: 7lb  Weight Gained Since Last Visit: 0lb   Vitals Temp: 97.8 F (36.6 C) BP: 114/73 Pulse Rate: 60 SpO2: 97 %   Anthropometric Measurements Height: 5' 4.5 (1.638 m) Weight: 261 lb (118.4 kg) BMI (Calculated): 44.12 Weight at Last Visit: 268lb Weight Lost Since Last Visit: 7lb Weight Gained Since Last Visit: 0lb Starting Weight: 300lb Total Weight Loss (lbs): 39 lb (17.7 kg)   Body Composition  Body Fat %: 49.7 % Fat Mass (lbs): 130 lbs Muscle Mass (lbs): 125 lbs Visceral Fat Rating : 18   Other Clinical Data Fasting: no Labs: no Today's Visit #: 11 Starting Date: 01/27/23     HPI  Chief Complaint: OBESITY  [name] is here to discuss her progress with her obesity treatment plan. She is on the the Category 3 Plan and states she is following her eating plan approximately 25 % of the time. She states she is exercising 0 minutes 0 days per week.   Interval History:  Since last office visit she has lost 7 pounds.  She is not skipping meals.  She tends to eat breakfast later in the day.  She is wanting to do IF and is trying to eat from noon-8 or 1pm-9pm.  She is drinking ginger ale every 2 days, water (not enough).  She was recently released to start exercising by her surgeon and plans to start chair exercises tomorrow.    She recently saw Dr. Kristie and was started Cholestyramine by Dr. Vernetta since her last visit and has been beneficial. Her diarrhea has improved.    Pharmacotherapy for weight loss: She is not currently taking medications  for medical weight loss.       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 Gladis.  Her highest weight prior to surgery was 455 pounds and her nadir weight after surgery was 240s.  She is taking Multivitamin, Vit B12 every day and Vit D every 2 weeks.  She reports no  restriction.     Pharmacotherapy for DMT2:   She is currently taking Ozempic  2mg .  Denies side effects.   Last A1c was 5.4 on 08/04/23 She is not checking BS at home.   Episodes of hypoglycemia: Denies On ARB. Taking Xarelto  for history of DVT/PE.  Denies history of CVA.   Last eye exam:  Dec 2024    Lab Results  Component Value Date   HGBA1C 5.4 08/04/2023   HGBA1C 5.5 01/27/2023   HGBA1C 5.5 11/04/2017   Lab Results  Component Value Date   LDLCALC 99 01/27/2023   CREATININE 1.18 (H) 08/04/2023      PHYSICAL EXAM:  Blood pressure 114/73, pulse 60, temperature 97.8 F (36.6 C), height 5' 4.5 (1.638 m), weight 261 lb (118.4 kg), last menstrual period 06/02/2011, SpO2 97%. Body mass index is 44.11 kg/m.  General: She is overweight, cooperative, alert, well developed, and in no acute distress. PSYCH: Has normal mood, affect and thought process.   Extremities: No edema.  Neurologic: No gross sensory or motor deficits. No tremors or fasciculations noted.    DIAGNOSTIC DATA REVIEWED:  BMET    Component Value Date/Time   NA 142 08/04/2023 1255   NA 144 01/27/2023 1041   K 3.6 08/04/2023 1255   CL 107 08/04/2023 1255   CO2 25 08/04/2023 1255   GLUCOSE  82 08/04/2023 1255   BUN 10 08/04/2023 1255   BUN 11 01/27/2023 1041   CREATININE 1.18 (H) 08/04/2023 1255   CALCIUM 9.4 08/04/2023 1255   GFRNONAA 52 (L) 08/04/2023 1255   GFRAA 62 11/04/2017 0943   Lab Results  Component Value Date   HGBA1C 5.4 08/04/2023   HGBA1C (H) 08/09/2007    10.9 (NOTE)   The ADA recommends the following therapeutic goals for glycemic   control related to Hgb A1C measurement:   Goal of Therapy:   < 7.0% Hgb A1C   Action Suggested:  > 8.0% Hgb A1C   Ref:  Diabetes Care, 22, Suppl. 1, 1999   Lab Results  Component Value Date   INSULIN  25.1 (H) 01/27/2023   INSULIN  15.2 11/04/2017   Lab Results  Component Value Date   TSH 2.960 01/27/2023   CBC    Component Value Date/Time   WBC 5.3  08/04/2023 1255   RBC 4.95 08/04/2023 1255   HGB 14.0 08/04/2023 1255   HGB 13.8 01/27/2023 1041   HGB 13.1 12/20/2007 1044   HCT 41.7 08/04/2023 1255   HCT 43.3 01/27/2023 1041   HCT 39.1 12/20/2007 1044   PLT 150 08/04/2023 1255   PLT 149 (L) 01/27/2023 1041   MCV 84.2 08/04/2023 1255   MCV 86 01/27/2023 1041   MCV 84.3 12/20/2007 1044   MCH 28.3 08/04/2023 1255   MCHC 33.6 08/04/2023 1255   RDW 14.5 08/04/2023 1255   RDW 14.1 01/27/2023 1041   RDW 15.6 (H) 12/20/2007 1044   Iron  Studies    Component Value Date/Time   IRON  91 01/27/2023 1041   TIBC 301 01/27/2023 1041   FERRITIN 30 01/27/2023 1041   IRONPCTSAT 30 01/27/2023 1041   Lipid Panel     Component Value Date/Time   CHOL 169 01/27/2023 1041   TRIG 79 01/27/2023 1041   HDL 55 01/27/2023 1041   CHOLHDL 3.1 01/27/2023 1041   CHOLHDL 5.9 08/09/2007 0039   VLDL 32 08/09/2007 0039   LDLCALC 99 01/27/2023 1041   Hepatic Function Panel     Component Value Date/Time   PROT 6.6 01/27/2023 1041   ALBUMIN 3.8 (L) 01/27/2023 1041   AST 16 01/27/2023 1041   ALT 23 01/27/2023 1041   ALKPHOS 94 01/27/2023 1041   BILITOT 0.4 01/27/2023 1041      Component Value Date/Time   TSH 2.960 01/27/2023 1041   Nutritional Lab Results  Component Value Date   VD25OH 83.8 01/27/2023   VD25OH 51.7 11/04/2017     ASSESSMENT AND PLAN  TREATMENT PLAN FOR OBESITY:  Recommended Dietary Goals  [name] is currently in the action stage of change. As such, her goal is to continue weight management plan. She has agreed to the Category 3 Plan.  Behavioral Intervention  We discussed the following Behavioral Modification Strategies today: increasing lean protein intake to established goals, decreasing simple carbohydrates , increasing vegetables, increasing fiber rich foods, increasing water intake , reading food labels , keeping healthy foods at home, continue to work on implementation of reduced calorie nutritional plan,  continue to practice mindfulness when eating, and continue to work on maintaining a reduced calorie state, getting the recommended amount of protein, incorporating whole foods, making healthy choices, staying well hydrated and practicing mindfulness when eating..  Additional resources provided today: NA  Recommended Physical Activity Goals  [name] has been advised to work up to 150 minutes of moderate intensity aerobic activity a week and strengthening exercises 2-3  times per week for cardiovascular health, weight loss maintenance and preservation of muscle mass.   She has agreed to Think about enjoyable ways to increase daily physical activity and overcoming barriers to exercise, Increase physical activity in their day and reduce sedentary time (increase NEAT)., and Work on scheduling and tracking physical activity.    ASSOCIATED CONDITIONS ADDRESSED TODAY  Action/Plan  Diet-controlled diabetes mellitus (HCC) -     Continue Semaglutide  (2 MG/DOSE); Inject 2 mg as directed once a week.  Dispense: 3 mL; Refill: 0.  Side effects discussed.    Obesity, Class III, BMI 40-49.9 (morbid obesity)     Starting chair exercises tomorrow Increase water intake Increase protein intake  Body fat % decreased Muscle mass increased Will continue to monitor     Return in about 4 weeks (around 12/09/2023).[name] She was informed of the importance of frequent follow up visits to maximize her success with intensive lifestyle modifications for her multiple health conditions.   ATTESTASTION STATEMENTS:  Reviewed by clinician on day of visit: allergies, medications, problem list, medical history, surgical history, family history, social history, and previous encounter notes.    Erin SAUNDERS. Evalynn Hankins FNP-C

## 2023-12-23 ENCOUNTER — Ambulatory Visit: Admitting: Family Medicine

## 2024-01-10 ENCOUNTER — Encounter: Payer: Self-pay | Admitting: Family Medicine

## 2024-01-10 ENCOUNTER — Ambulatory Visit: Admitting: Family Medicine

## 2024-01-10 VITALS — BP 138/85 | HR 61 | Temp 97.4°F | Ht 64.5 in | Wt 256.0 lb

## 2024-01-10 DIAGNOSIS — Z6841 Body Mass Index (BMI) 40.0 and over, adult: Secondary | ICD-10-CM | POA: Diagnosis not present

## 2024-01-10 DIAGNOSIS — E1169 Type 2 diabetes mellitus with other specified complication: Secondary | ICD-10-CM | POA: Diagnosis not present

## 2024-01-10 DIAGNOSIS — Z7289 Other problems related to lifestyle: Secondary | ICD-10-CM

## 2024-01-10 DIAGNOSIS — Z9189 Other specified personal risk factors, not elsewhere classified: Secondary | ICD-10-CM

## 2024-01-10 DIAGNOSIS — E66813 Obesity, class 3: Secondary | ICD-10-CM | POA: Diagnosis not present

## 2024-01-10 DIAGNOSIS — Z9884 Bariatric surgery status: Secondary | ICD-10-CM

## 2024-01-10 DIAGNOSIS — R635 Abnormal weight gain: Secondary | ICD-10-CM

## 2024-01-10 DIAGNOSIS — Z7985 Long-term (current) use of injectable non-insulin antidiabetic drugs: Secondary | ICD-10-CM

## 2024-01-10 MED ORDER — SEMAGLUTIDE (2 MG/DOSE) 8 MG/3ML ~~LOC~~ SOPN: 2.0000 mg | PEN_INJECTOR | SUBCUTANEOUS | 3 refills | Status: DC

## 2024-01-10 NOTE — Progress Notes (Signed)
 Office: [phone]  /  Fax: [phone]  WEIGHT SUMMARY AND BIOMETRICS  Starting Date: 01/27/23  Starting Weight: 300lb   Weight Lost Since Last Visit: 5lb   Vitals Temp: (!) 97.4 F (36.3 C) BP: 138/85 Pulse Rate: 61 SpO2: 99 %   Body Composition  Body Fat %: 51.6 % Fat Mass (lbs): 132.4 lbs Muscle Mass (lbs): 117.8 lbs Total Body Water (lbs): 97.6 lbs Visceral Fat Rating : 18    HPI  Chief Complaint: OBESITY  [name] is here to discuss her progress with her obesity treatment plan. She is on the the Category 3 Plan and states she is following her eating plan approximately 50 % of the time. She states she is exercising 0 minutes 0 times per week.  Interval History:  Since last office visit she is down 5 lb She is struggling to get in veggies since GB surgery She is net down 44 lb in the past 11 mos of medically supervised weight management This is a 14.6% total body weight loss She has been able to cut back on cholecystyramine  She is working on hydrating with water She is able to walk more and prefers to walk indoors at the office, able to do some laps in the [employer] at work  She is taking Ozempic  2 mg once weekly injection She is eating off plan half the time and not tracking her calories  Pharmacotherapy: Ozempic  2 mg weekly  PHYSICAL EXAM:  Blood pressure 138/85, pulse 61, temperature (!) 97.4 F (36.3 C), height 5' 4.5 (1.638 m), weight 256 lb (116.1 kg), last menstrual period 06/02/2011, SpO2 99%. Body mass index is 43.26 kg/m.  General: She is overweight, cooperative, alert, well developed, and in no acute distress. PSYCH: Has normal mood, affect and thought process.   Lungs: Normal breathing effort, no conversational dyspnea.   ASSESSMENT AND PLAN  TREATMENT PLAN FOR OBESITY:  Recommended Dietary Goals  [name] is currently in the action stage of change. As such, her goal is to continue weight management plan. She has agreed to keeping a  food journal and adhering to recommended goals of 1500 calories and 90+ grams of protein and following a lower carbohydrate, vegetable and lean protein rich diet plan.  Behavioral Intervention  We discussed the following Behavioral Modification Strategies today: increasing lean protein intake to established goals, increasing fiber rich foods, avoiding skipping meals, increasing water intake , work on meal planning and preparation, work on Counselling psychologist calories using tracking application, keeping healthy foods at home, practice mindfulness eating and understand the difference between hunger signals and cravings, continue to practice mindfulness when eating, and planning for success.  Additional resources provided today: NA  Recommended Physical Activity Goals  [name] has been advised to work up to 150 minutes of moderate intensity aerobic activity a week and strengthening exercises 2-3 times per week for cardiovascular health, weight loss maintenance and preservation of muscle mass.   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  She has struggled for many years with poor dietary compliance and lack of regular exercise.  She has been more mindful of her food choices.  She has been slow to adopt regular exercise.  Issues with mood and chronic pain have been barriers  to her progress.  Encouraged her to track her calorie intake to ensure she is hitting her daily calorie and protein targets.  She may benefit longer-term from following up with a registered dietitian and exercise physiologist.  Obesity, Class III, BMI 40-49.9 (morbid obesity) Improving BMI 40.0-44.9, adult (HCC)  Sedentary lifestyle Stable Her motivation to increase regular exercise has remained fairly poor.  Type 2 diabetes mellitus with other specified complication, unspecified whether long term insulin  use (HCC) Lab Results  Component Value Date   HGBA1C 5.4 08/04/2023  She has good control of her type 2 diabetes using Ozempic  2 mg once weekly injection without adverse side effect.  She has been able to reduce her portion sizes and snacking with the help of Ozempic .  Continue to work on a low sugar/low starch diet focus on lean protein and fiber with meals.  Work on increasing walking time.  -     Semaglutide  (2 MG/DOSE); Inject 2 mg as directed once a week.  Dispense: 3 mL; Refill: 3      She was informed of the importance of frequent follow up visits to maximize her success with intensive lifestyle modifications for her multiple health conditions.   ATTESTASTION STATEMENTS:  Reviewed by clinician on day of visit: allergies, medications, problem list, medical history, surgical history, family history, social history, and previous encounter notes pertinent to obesity diagnosis.   I have personally spent 30 minutes total time today in preparation, patient care, nutritional counseling and education,  and documentation for this visit, including the following: review of most recent clinical lab tests, prescribing medications/ refilling medications, reviewing medical assistant documentation, review and interpretation of bioimpedence results.     Darice Haddock, D.O. DABFM, DABOM [facility] [address] [place], [place] [address] [phone]

## 2024-01-25 ENCOUNTER — Other Ambulatory Visit: Payer: Self-pay

## 2024-01-25 MED ORDER — OMEPRAZOLE 20 MG PO CPDR: 20.0000 mg | DELAYED_RELEASE_CAPSULE | Freq: Every day | ORAL | 0 refills | Status: DC

## 2024-02-07 ENCOUNTER — Ambulatory Visit: Admitting: Nurse Practitioner

## 2024-03-06 ENCOUNTER — Encounter: Payer: Self-pay | Admitting: Nurse Practitioner

## 2024-03-06 ENCOUNTER — Ambulatory Visit: Admitting: Nurse Practitioner

## 2024-03-06 VITALS — BP 143/86 | HR 65 | Temp 98.0°F | Ht 64.5 in | Wt 258.0 lb

## 2024-03-06 DIAGNOSIS — E66813 Obesity, class 3: Secondary | ICD-10-CM | POA: Diagnosis not present

## 2024-03-06 DIAGNOSIS — Z6841 Body Mass Index (BMI) 40.0 and over, adult: Secondary | ICD-10-CM | POA: Diagnosis not present

## 2024-03-06 DIAGNOSIS — E1169 Type 2 diabetes mellitus with other specified complication: Secondary | ICD-10-CM

## 2024-03-06 DIAGNOSIS — Z7985 Long-term (current) use of injectable non-insulin antidiabetic drugs: Secondary | ICD-10-CM

## 2024-03-06 NOTE — Progress Notes (Signed)
 Office: [phone]  /  Fax: [phone]  WEIGHT SUMMARY AND BIOMETRICS  Weight Lost Since Last Visit: 0lb  Weight Gained Since Last Visit: 2lb   Vitals Temp: 98 F (36.7 C) BP: (!) 143/86 Pulse Rate: 65 SpO2: 100 %   Anthropometric Measurements Height: 5' 4.5 (1.638 m) Weight: 258 lb (117 kg) BMI (Calculated): 43.62 Weight at Last Visit: 256lb Weight Lost Since Last Visit: 0lb Weight Gained Since Last Visit: 2lb Starting Weight: 300lb Total Weight Loss (lbs): 42 lb (19.1 kg)   Body Composition  Body Fat %: 57.1 % Fat Mass (lbs): 147.8 lbs Muscle Mass (lbs): 105.4 lbs Visceral Fat Rating : 20   Other Clinical Data Fasting: No Labs: Yes Today's Visit #: 13 Starting Date: 01/27/23     HPI  Chief Complaint: OBESITY  [name] is here to discuss her progress with her obesity treatment plan. She is on the the Category 3 Plan and states she is following her eating plan approximately 25 % of the time. She states she is exercising 0 minutes 0 days per week.   Interval History:  Since last office visit she has gained 2 pounds.  She celebrated her birthday since her last visit and went to [place].  She notes that she has gotten off track. She has been celebrating all month.  She doesn't feel that she is not eating enough because she is tired of the food choices on her meal plan.  She is drinking premier protein shake 2 times per week, water and hot tea with honey.  She is not exercising.  We have discussed doing chair exercises in the past.    She had labs at PCP's office on 02/01/24.  Will request labs to be sent to us .    Pharmacotherapy for weight loss: She is not currently taking medications  for medical weight loss.       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 Gladis.  Her highest weight prior to surgery was 455 pounds and her nadir weight after surgery was 240s.  She is taking Multivitamin, Vit B12 every  day and Vit D every 2 weeks.  She reports no restriction.    Pharmacotherapy for DMT2:   She is currently taking Ozempic  2mg .  Denies side effects.   Last A1c was 5.4 on 08/04/23 She is not checking BS at home.   Episodes of hypoglycemia: None On ARB-Diovan  HCT 80-12.5mg . She is also taking Xarelto  for history of DVT/PE.  Denies history of CVA.   Last eye exam:  Reports that she she had an eye exam sometime this year but is unsure of the date.      Lab Results  Component Value Date   HGBA1C 5.4 08/04/2023   HGBA1C 5.5 01/27/2023   HGBA1C 5.5 11/04/2017   Lab Results  Component Value Date   LDLCALC 99 01/27/2023   CREATININE 1.18 (H) 08/04/2023      PHYSICAL EXAM:  Blood pressure (!) 143/86, pulse 65, temperature 98 F (36.7 C), height 5' 4.5 (1.638 m), weight 258 lb (117 kg), last menstrual period 06/02/2011, SpO2 100%. Body mass index is 43.6 kg/m.  General: She is overweight, cooperative, alert, well developed, and in no acute distress. PSYCH: Has normal mood, affect and thought process.   Extremities: No edema.  Neurologic: No gross sensory or motor deficits. No tremors or fasciculations noted.    DIAGNOSTIC DATA REVIEWED:  BMET  Component Value Date/Time   NA 142 08/04/2023 1255   NA 144 01/27/2023 1041   K 3.6 08/04/2023 1255   CL 107 08/04/2023 1255   CO2 25 08/04/2023 1255   GLUCOSE 82 08/04/2023 1255   BUN 10 08/04/2023 1255   BUN 11 01/27/2023 1041   CREATININE 1.18 (H) 08/04/2023 1255   CALCIUM 9.4 08/04/2023 1255   GFRNONAA 52 (L) 08/04/2023 1255   GFRAA 62 11/04/2017 0943   Lab Results  Component Value Date   HGBA1C 5.4 08/04/2023   HGBA1C (H) 08/09/2007    10.9 (NOTE)   The ADA recommends the following therapeutic goals for glycemic   control related to Hgb A1C measurement:   Goal of Therapy:   < 7.0% Hgb A1C   Action Suggested:  > 8.0% Hgb A1C   Ref:  Diabetes Care, 22, Suppl. 1, 1999   Lab Results  Component Value Date   INSULIN  25.1 (H)  01/27/2023   INSULIN  15.2 11/04/2017   Lab Results  Component Value Date   TSH 2.960 01/27/2023   CBC    Component Value Date/Time   WBC 5.3 08/04/2023 1255   RBC 4.95 08/04/2023 1255   HGB 14.0 08/04/2023 1255   HGB 13.8 01/27/2023 1041   HGB 13.1 12/20/2007 1044   HCT 41.7 08/04/2023 1255   HCT 43.3 01/27/2023 1041   HCT 39.1 12/20/2007 1044   PLT 150 08/04/2023 1255   PLT 149 (L) 01/27/2023 1041   MCV 84.2 08/04/2023 1255   MCV 86 01/27/2023 1041   MCV 84.3 12/20/2007 1044   MCH 28.3 08/04/2023 1255   MCHC 33.6 08/04/2023 1255   RDW 14.5 08/04/2023 1255   RDW 14.1 01/27/2023 1041   RDW 15.6 (H) 12/20/2007 1044   Iron  Studies    Component Value Date/Time   IRON  91 01/27/2023 1041   TIBC 301 01/27/2023 1041   FERRITIN 30 01/27/2023 1041   IRONPCTSAT 30 01/27/2023 1041   Lipid Panel     Component Value Date/Time   CHOL 169 01/27/2023 1041   TRIG 79 01/27/2023 1041   HDL 55 01/27/2023 1041   CHOLHDL 3.1 01/27/2023 1041   CHOLHDL 5.9 08/09/2007 0039   VLDL 32 08/09/2007 0039   LDLCALC 99 01/27/2023 1041   Hepatic Function Panel     Component Value Date/Time   PROT 6.6 01/27/2023 1041   ALBUMIN 3.8 (L) 01/27/2023 1041   AST 16 01/27/2023 1041   ALT 23 01/27/2023 1041   ALKPHOS 94 01/27/2023 1041   BILITOT 0.4 01/27/2023 1041      Component Value Date/Time   TSH 2.960 01/27/2023 1041   Nutritional Lab Results  Component Value Date   VD25OH 83.8 01/27/2023   VD25OH 51.7 11/04/2017     ASSESSMENT AND PLAN  TREATMENT PLAN FOR OBESITY:  Recommended Dietary Goals  Adine is currently in the action stage of change. As such, her goal is to continue weight management plan. She has agreed to keeping a food journal and adhering to recommended goals of 1500 calories and 100+ grams protein.  AI meal plan created with patient today.  Reviewed and given to patient. Multiple recipes given to the patient.    Behavioral Intervention  We discussed the  following Behavioral Modification Strategies today: increasing lean protein intake to established goals, decreasing simple carbohydrates , increasing vegetables, increasing fiber rich foods, increasing water intake , work on meal planning and preparation, reading food labels , keeping healthy foods at home, continue to work  on maintaining a reduced calorie state, getting the recommended amount of protein, incorporating whole foods, making healthy choices, staying well hydrated and practicing mindfulness when eating., and increase protein intake, fibrous foods (25 grams per day for women, 30 grams for men) and water to improve satiety and decrease hunger signals. .  Additional resources provided today: NA  Recommended Physical Activity Goals  Galilee has been advised to work up to 150 minutes of moderate intensity aerobic activity a week and strengthening exercises 2-3 times per week for cardiovascular health, weight loss maintenance and preservation of muscle mass.   She has agreed to Think about enjoyable ways to increase daily physical activity and overcoming barriers to exercise, Increase physical activity in their day and reduce sedentary time (increase NEAT)., Work on scheduling and tracking physical activity. , and Combine aerobic and strengthening exercises for efficiency and improved cardiometabolic health.   ASSOCIATED CONDITIONS ADDRESSED TODAY  Action/Plan  Type 2 diabetes mellitus with other specified complication, unspecified whether long term insulin  use (HCC) Good blood sugar control is important to decrease the likelihood of diabetic complications such as nephropathy, neuropathy, limb loss, blindness, coronary artery disease, and death. Intensive lifestyle modification including diet, exercise and weight loss are the first line of treatment for diabetes.    Obesity, Class III, BMI 40-49.9 (morbid obesity) (HCC)     Will obtain labs at next visit  Labs requested from  PCP  Discussed the importance of exercising.  Again discussed starting chair exercises.    Increase water intake  Stop sugary drinks.   Return in about 4 weeks (around 04/03/2024).SABRA She was informed of the importance of frequent follow up visits to maximize her success with intensive lifestyle modifications for her multiple health conditions.   ATTESTASTION STATEMENTS:  Reviewed by clinician on day of visit: allergies, medications, problem list, medical history, surgical history, family history, social history, and previous encounter notes.   I personally spent a total of 63 minutes in the care of the patient today including preparing to see the patient, getting/reviewing separately obtained history, performing a medically appropriate exam/evaluation, counseling and educating, and documenting clinical information in the EHR.    Corean SAUNDERS. Dao Mearns FNP-C

## 2024-03-09 ENCOUNTER — Ambulatory Visit (INDEPENDENT_AMBULATORY_CARE_PROVIDER_SITE_OTHER): Admitting: Allergy

## 2024-03-09 ENCOUNTER — Other Ambulatory Visit: Payer: Self-pay

## 2024-03-09 ENCOUNTER — Encounter: Payer: Self-pay | Admitting: Allergy

## 2024-03-09 VITALS — BP 128/70 | Temp 97.3°F | Resp 20 | Ht 64.5 in | Wt 260.7 lb

## 2024-03-09 DIAGNOSIS — J4540 Moderate persistent asthma, uncomplicated: Secondary | ICD-10-CM | POA: Diagnosis not present

## 2024-03-09 DIAGNOSIS — K219 Gastro-esophageal reflux disease without esophagitis: Secondary | ICD-10-CM | POA: Diagnosis not present

## 2024-03-09 DIAGNOSIS — H1013 Acute atopic conjunctivitis, bilateral: Secondary | ICD-10-CM

## 2024-03-09 DIAGNOSIS — J3089 Other allergic rhinitis: Secondary | ICD-10-CM | POA: Diagnosis not present

## 2024-03-09 DIAGNOSIS — L309 Dermatitis, unspecified: Secondary | ICD-10-CM

## 2024-03-09 NOTE — Patient Instructions (Addendum)
 Asthma - do very well - Trelegy 100mcg 1 puff daily.  Rinse mouth after use - continue Singulair  10mg  daily - have access to Xopenex  inhaler 2 puffs every 4-6 hours as needed for cough/wheeze/shortness of breath/chest tightness.  May use 15-20 minutes prior to activity.   Monitor frequency of use.   - continue Dupixent  injections every 2 weeks self-administered. Alternate your injection sites.   Asthma control goals:  Full participation in all desired activities (may need albuterol  before activity) Albuterol  use two time or less a week on average (not counting use with activity) Cough interfering with sleep two time or less a month Oral steroids no more than once a year No hospitalizations  Allergies   - continue your allergen avoidance measures  - use levocetirizine 1/2 tab daily  - for itchy/watery eyes can continue use of Olopatadine  1 drop each eye daily as needed  - for nasal congestion/stuffiness use of Nasonex  or Nasacort 1-2 sprays each nostril daily as needed.  This is nasal steroid spray.  - for nasal drainage/post-nasal drip use Astelin  2 sprays each nostril 2 times a day for symptom control  - use nasal saline rinse twice a day at this time. Use saline rinse kit prior to nasal spray use.  Use distilled water or boil water and bring to room temperature (do not use tap water).   Reflux  - reflux symptoms can be triggers of asthma symptoms  - use Omeprazole  as needed for reflux control  Itching   - use Opzelura thin layer to areas that itch twice a day if needed.  This is a non-steroid ointment that can be use anywhere on body if needed  - use Elocon  ointment daily if needed for itchy rash.  This is a steroid ointment.   Follow-up 6 months or sooner if needed

## 2024-03-09 NOTE — Progress Notes (Signed)
 Follow-up Note  RE: [name] MRN: [id number] DOB: [birth year] Date of Office Visit: 03/09/2024   History of present illness: [name] is a 63 y.o. female presenting today for follow-up of asthma, allergic rhinitis, reflux, pruritus.   She was last seen in the office on 09/01/23 by myself.   Discussed the use of AI scribe software for clinical note transcription with the patient, who gave verbal consent to proceed.  Her asthma has been stable over the past six months, with no need for her rescue inhaler. She is on Trelegy at the lowest dose of 100mcg. She continues to use Dupixent  for asthma control and has not experienced any asthma flare-ups in the past two to three years.  Regarding her allergies, she notes increased mucus production this week, possibly due to inconsistent weather. She uses Astelin  nasal spray, which she has increased to twice daily this week to manage symptoms. She also experiences periodic epistaxis, which she attributes to dryness despite using a humidifier with her CPAP machine.  She experiences occasional pruritus, primarily on her arms and recently on her foot. She uses Aquaphor and Eucerin for moisturizing, which she finds helpful. She also uses an ointment for itching, which provides relief.  She reports irritation in her right ear canal. She keeps her hearing aids in until bedtime.   Review of systems: 10pt ROS negative unless noted above in HPI   Past medical/social/surgical/family history have been reviewed and are unchanged unless specifically indicated below.  No changes  Medication List: Current Outpatient Medications  Medication Sig Dispense Refill   acetaminophen  (TYLENOL ) 500 MG tablet Take 1,000 mg by mouth daily as needed (pain.).     allopurinol (ZYLOPRIM) 100 MG tablet Take 100 mg by mouth daily.     Ascorbic Acid  (VITAMIN C ) 1000 MG tablet Take 1,000 mg by mouth daily as needed (immune support). Emergen C     azelastine   (ASTELIN ) 0.1 % nasal spray USE 2 SPRAYS IN EACH NOSTRIL TWICE DAILY AS NEEDED(NASAL DRAINAGE) AS DIRECTED 30 mL 5   brinzolamide  (AZOPT ) 1 % ophthalmic suspension Place 1 drop into both eyes 3 (three) times daily.     Calcium Carbonate-Vit D-Min (CALTRATE 600+D PLUS MINERALS PO) Take 2 tablets by mouth 3 (three) times daily as needed (bone health).     colchicine  0.6 MG tablet Take 0.6 mg by mouth daily.     COMBIGAN  0.2-0.5 % ophthalmic solution Place 1 drop into both eyes 3 (three) times daily.     Cyanocobalamin  (VITAMIN B-12) 2500 MCG SUBL Place 5,000 mcg under the tongue at bedtime.     diltiazem  (CARDIZEM  CD) 240 MG 24 hr capsule Take 240 mg by mouth at bedtime.     Docosahexaenoic Acid (DHA PO) Take 2 tablets by mouth at bedtime. DHA [employer]     dupilumab  (DUPIXENT ) 300 MG/2ML prefilled syringe INJECT 1 SYRINGE UNDER THE SKIN EVERY 14 DAYS 4 mL 11   fexofenadine (ALLEGRA) 180 MG tablet Take 180 mg by mouth daily as needed for allergies.     Fluticasone -Umeclidin-Vilant (TRELEGY ELLIPTA ) 100-62.5-25 MCG/ACT AEPB Inhale 1 puff into the lungs daily in the afternoon. Rinse mouth after use. 28 each 5   furosemide (LASIX) 40 MG tablet Take 40 mg by mouth daily as needed (fluid retention).     gabapentin  (NEURONTIN ) 300 MG capsule Take 300 mg by mouth 3 (three) times daily as needed (Pain).     HYDROcodone -acetaminophen  (NORCO) 7.5-325 MG tablet Take 1 tablet  by mouth every 6 (six) hours as needed for moderate pain (pain score 4-6). 30 tablet 0   Latanoprostene Bunod 0.024 % SOLN Place 1 drop into both eyes daily.     levalbuterol  (XOPENEX  HFA) 45 MCG/ACT inhaler Inhale 2 puffs into the lungs every 4 (four) hours as needed. 15 g 1   levocetirizine (XYZAL ) 5 MG tablet TAKE 1 TABLET(5 MG) BY MOUTH EVERY EVENING 90 tablet 1   magnesium  chloride (SLOW-MAG) 64 MG TBEC Take 1-2 tablets by mouth See admin instructions. Take 2 tablets by mouth every morning and 1 tablet by mouth with dinner      mometasone  (ELOCON ) 0.1 % ointment Apply topically daily as needed (Itchy rash). 45 g 5   mometasone  (NASONEX ) 50 MCG/ACT nasal spray USE 2 SPRAYS IN EACH NOSTRIL DAILY 17 g 5   montelukast  (SINGULAIR ) 10 MG tablet Take 1 tablet (10 mg total) by mouth at bedtime. 30 tablet 5   Multiple Vitamin (MULTIVITAMIN WITH MINERALS) TABS tablet Take 2 tablets by mouth every evening.     Olopatadine  HCl 0.2 % SOLN Apply 1 drop to eye daily as needed (Itchy, watery eyes). 2.5 mL 5   omeprazole  (PRILOSEC) 20 MG capsule Take 1 capsule (20 mg total) by mouth daily. 90 capsule 0   potassium chloride  SA (K-DUR,KLOR-CON ) 20 MEQ tablet Take 20-40 mEq by mouth See admin instructions. Take 2 tablets (40 meq) by mouth with breakfast and 1 tablet (20 meq) by mouth with dinner     Propylene Glycol 0.6 % SOLN Place 1 drop into both eyes 3 (three) times daily as needed (Dry eye).     quiNINE  (QUALAQUIN ) 324 MG capsule Take 324 mg by mouth at bedtime.      RHOPRESSA 0.02 % SOLN Place 1 drop into both eyes in the morning.     rivaroxaban  (XARELTO ) 20 MG TABS tablet Take 20 mg by mouth at bedtime.      Semaglutide , 2 MG/DOSE, 8 MG/3ML SOPN Inject 2 mg as directed once a week. 3 mL 3   Sodium Chloride -Sodium Bicarb (NETI POT SINUS WASH NA) Place 1 application into the nose daily as needed (congestion).     TRELEGY ELLIPTA  200-62.5-25 MCG/ACT AEPB INHALE 1 PUFF INTO THE LUNGS DAILY 60 each 2   valsartan -hydrochlorothiazide  (DIOVAN -HCT) 80-12.5 MG per tablet Take 1 tablet by mouth in the morning.     Vitamin D , Ergocalciferol , (DRISDOL ) 50000 UNITS CAPS capsule Take 50,000 Units by mouth every 14 (fourteen) days.     zaleplon (SONATA) 5 MG capsule Take 5 mg by mouth at bedtime as needed for sleep.     zinc sulfate 220 (50 Zn) MG capsule Take 220 mg by mouth in the morning. Mid morning     No current facility-administered medications for this visit.     Known medication allergies: Allergies  Allergen Reactions   Beta  Adrenergic Blockers Other (See Comments)    RED EYES AND CONGESTION   Sulfa Antibiotics Hives    RED EYES/RED CHEST   Thimerosal (Thiomersal) Hives and Itching     Physical examination: Blood pressure 128/70, temperature (!) 97.3 F (36.3 C), temperature source Temporal, resp. rate 20, height 5' 4.5 (1.638 m), weight 260 lb 11.2 oz (118.3 kg), last menstrual period 06/02/2011.  General: Alert, interactive, in no acute distress. HEENT: PERRLA, mild erythema thematous inflammation of the right ear canal, TMs pearly gray, turbinates non-edematous without discharge, post-pharynx non erythematous. Neck: Supple without lymphadenopathy. Lungs: Clear to auscultation without wheezing,  rhonchi or rales. {no increased work of breathing. CV: Normal S1, S2 without murmurs. Abdomen: Nondistended, nontender. Skin: Warm and dry, without lesions or rashes. Extremities:  No clubbing, cyanosis or edema. Neuro:   Grossly intact.  Diagnostics/Labs:  Spirometry: FEV1: 1.75L 86%, FVC: 2.01L 78%, ratio consistent with nonobstructive pattern  Assessment and plan: Asthma - do very well - Trelegy 100mcg 1 puff daily.  Rinse mouth after use - continue Singulair  10mg  daily - have access to Xopenex  inhaler 2 puffs every 4-6 hours as needed for cough/wheeze/shortness of breath/chest tightness.  May use 15-20 minutes prior to activity.   Monitor frequency of use.   - continue Dupixent  injections every 2 weeks self-administered. Alternate your injection sites.   Asthma control goals:  Full participation in all desired activities (may need albuterol  before activity) Albuterol  use two time or less a week on average (not counting use with activity) Cough interfering with sleep two time or less a month Oral steroids no more than once a year No hospitalizations  Allergies   - continue your allergen avoidance measures  - use levocetirizine 1/2 tab daily  - for itchy/watery eyes can continue use of Olopatadine  1  drop each eye daily as needed  - for nasal congestion/stuffiness use of Nasonex  or Nasacort 1-2 sprays each nostril daily as needed.  This is nasal steroid spray.  - for nasal drainage/post-nasal drip use Astelin  2 sprays each nostril 2 times a day for symptom control  - use nasal saline rinse twice a day at this time. Use saline rinse kit prior to nasal spray use.  Use distilled water or boil water and bring to room temperature (do not use tap water).   Reflux  - reflux symptoms can be triggers of asthma symptoms  - use Omeprazole  as needed for reflux control  Itching   - use Opzelura thin layer to areas that itch twice a day if needed.  This is a non-steroid ointment that can be use anywhere on body if needed  - use Elocon  ointment daily if needed for itchy rash.  This is a steroid ointment.   Follow-up 6 months or sooner if needed  I appreciate the opportunity to take part in [name] care. Please do not hesitate to contact me with questions.  Sincerely,   Danita Brain, MD Allergy/Immunology [facility]

## 2024-03-14 NOTE — Addendum Note (Signed)
 Addended by: AZALEA, Shekira Drummer on: 03/14/2024 05:25 PM   Modules accepted: Orders

## 2024-03-16 ENCOUNTER — Other Ambulatory Visit: Payer: Self-pay | Admitting: Allergy

## 2024-03-20 ENCOUNTER — Other Ambulatory Visit: Payer: Self-pay | Admitting: *Deleted

## 2024-03-20 MED ORDER — DUPIXENT 300 MG/2ML ~~LOC~~ SOSY: PREFILLED_SYRINGE | SUBCUTANEOUS | 11 refills | Status: AC

## 2024-03-25 ENCOUNTER — Other Ambulatory Visit: Payer: Self-pay | Admitting: Allergy

## 2024-04-06 ENCOUNTER — Ambulatory Visit: Admitting: Family Medicine

## 2024-04-06 ENCOUNTER — Other Ambulatory Visit: Payer: Self-pay | Admitting: Allergy

## 2024-04-20 ENCOUNTER — Other Ambulatory Visit: Payer: Self-pay | Admitting: Allergy

## 2024-04-27 ENCOUNTER — Encounter: Payer: Self-pay | Admitting: Family Medicine

## 2024-04-27 ENCOUNTER — Ambulatory Visit: Admitting: Family Medicine

## 2024-04-27 VITALS — BP 124/78 | HR 68 | Temp 97.6°F | Ht 64.5 in | Wt 252.0 lb

## 2024-04-27 DIAGNOSIS — Z6841 Body Mass Index (BMI) 40.0 and over, adult: Secondary | ICD-10-CM

## 2024-04-27 DIAGNOSIS — R635 Abnormal weight gain: Secondary | ICD-10-CM

## 2024-04-27 DIAGNOSIS — E1169 Type 2 diabetes mellitus with other specified complication: Secondary | ICD-10-CM | POA: Diagnosis not present

## 2024-04-27 DIAGNOSIS — K912 Postsurgical malabsorption, not elsewhere classified: Secondary | ICD-10-CM

## 2024-04-27 DIAGNOSIS — Z7985 Long-term (current) use of injectable non-insulin antidiabetic drugs: Secondary | ICD-10-CM | POA: Diagnosis not present

## 2024-04-27 DIAGNOSIS — Z9884 Bariatric surgery status: Secondary | ICD-10-CM

## 2024-04-27 MED ORDER — SEMAGLUTIDE (2 MG/DOSE) 8 MG/3ML ~~LOC~~ SOPN: 2.0000 mg | PEN_INJECTOR | SUBCUTANEOUS | 3 refills | Status: AC

## 2024-04-27 NOTE — Progress Notes (Signed)
 "  Office: [phone]  /  Fax: [phone]  WEIGHT SUMMARY AND BIOMETRICS  Starting Date: 01/27/23  Starting Weight: 300lb   Weight Lost Since Last Visit: 6lb   Vitals Temp: 97.6 F (36.4 C) BP: 124/78 Pulse Rate: 68 SpO2: 100 %   Body Composition  Body Fat %: 55.5 % Fat Mass (lbs): 140.2 lbs Muscle Mass (lbs): 106.8 lbs Visceral Fat Rating : 19    HPI  Chief Complaint: OBESITY  [name] is here to discuss her progress with her obesity treatment plan. She is on the the Category 3 Plan and states she is following her eating plan approximately 50-75 % of the time. She states she is exercising 0 minutes 0 times per week.  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 Body mass index is 42.59 kg/m.  General: She is overweight, cooperative, alert, well developed, and in no acute distress. PSYCH: Has normal mood, affect and thought process.   Lungs: Normal breathing effort, no conversational dyspnea.   ASSESSMENT AND PLAN  TREATMENT PLAN FOR OBESITY:  Recommended Dietary Goals  [name] is currently in the action stage of change. As such, her goal is to continue weight management plan. She  has agreed to the Category 3 Plan.  Behavioral Intervention  We discussed the following Behavioral Modification Strategies today: increasing lean protein intake to established goals, increasing fiber rich foods, increasing water intake , work on meal planning and preparation, keeping healthy foods at home, continue to practice mindfulness when eating, and planning for success.  Additional resources provided today: NA  Recommended Physical Activity Goals  [name] has been advised to work up to 150 minutes of moderate intensity aerobic activity a week and strengthening exercises 2-3 times per week for cardiovascular health, weight loss maintenance and preservation of muscle mass.   She has agreed to Start aerobic activity with a goal of 150 minutes a week at moderate intensity.   Pharmacotherapy changes for the treatment of obesity: None  ASSOCIATED CONDITIONS ADDRESSED TODAY  Type 2 diabetes mellitus with other specified complication, unspecified whether long term insulin  use (HCC) Lab Results  Component Value Date   HGBA1C 5.4 08/04/2023  She has good control of her type 2 diabetes, not consistently checking blood sugars at home She has some inconsistencies with dietary intake.  Consider adding in medical nutrition therapy with RD She has room for improvement with regular exercise She denies feelings of hypoglycemia Continue Ozempic  at 2 mg once weekly injection -     Semaglutide  (2 MG/DOSE); Inject 2 mg as directed once a week.  Dispense: 3 mL; Refill: 3 -     Hemoglobin A1c -  Lipid panel -     Insulin , random  Postoperative intestinal malabsorption She reports good compliance taking a bariatric multivitamin, calcium and vitamin D  as directed.  Recheck labs today -     Comprehensive metabolic panel with GFR -     CBC -     Vitamin B12 -     Folate -     Prealbumin -     Ferritin -     Iron  and TIBC -     Vitamin B1  Weight gain following gastric bypass surgery -      TSH Rfx on Abnormal to Free T4 She has about 10 pounds up from her previous nadir weight post gastric bypass surgery  Continue to work on meal planning, lean protein and fiber at meals and avoiding high sugar foods and drinks  Morbid obesity (HCC) Improving  BMI 40.0-44.9, adult (HCC)      She was informed of the importance of frequent follow up visits to maximize her success with intensive lifestyle modifications for her multiple health conditions.   ATTESTASTION STATEMENTS:  Reviewed by clinician on day of visit: allergies, medications, problem list, medical history, surgical history, family history, social history, and previous encounter notes pertinent to obesity diagnosis.   I have personally spent 30 minutes total time today in preparation, patient care, nutritional counseling and education,  and documentation for this visit, including the following: review of most recent clinical lab tests, prescribing medications/ refilling medications, reviewing medical assistant documentation, review and interpretation of bioimpedence results.     Darice Haddock, D.O. DABFM, DABOM [facility] [address] [place], [place] [address] [phone] "

## 2024-05-03 LAB — CBC
Hematocrit: 41.5 % (ref 34.0–46.6)
Hemoglobin: 12.9 g/dL (ref 11.1–15.9)
MCH: 26.3 pg — ABNORMAL LOW (ref 26.6–33.0)
MCHC: 31.1 g/dL — ABNORMAL LOW (ref 31.5–35.7)
MCV: 85 fL (ref 79–97)
Platelets: 151 x10E3/uL (ref 150–450)
RBC: 4.9 x10E6/uL (ref 3.77–5.28)
RDW: 14.2 % (ref 11.7–15.4)
WBC: 3.9 x10E3/uL (ref 3.4–10.8)

## 2024-05-03 LAB — PREALBUMIN: PREALBUMIN: 28 mg/dL (ref 10–36)

## 2024-05-03 LAB — FOLATE: Folate: >20 ng/mL

## 2024-05-03 LAB — COMPREHENSIVE METABOLIC PANEL WITH GFR
ALT: 20 IU/L (ref 0–32)
AST: 20 IU/L (ref 0–40)
Albumin: 3.6 g/dL — ABNORMAL LOW (ref 3.9–4.9)
Alkaline Phosphatase: 90 IU/L (ref 49–135)
BUN/Creatinine Ratio: 18 (ref 12–28)
BUN: 21 mg/dL (ref 8–27)
Bilirubin Total: 0.4 mg/dL (ref 0.0–1.2)
CO2: 22 mmol/L (ref 20–29)
Calcium: 9 mg/dL (ref 8.7–10.3)
Chloride: 110 mmol/L — ABNORMAL HIGH (ref 96–106)
Creatinine, Ser: 1.18 mg/dL — ABNORMAL HIGH (ref 0.57–1.00)
Globulin, Total: 2.7 g/dL (ref 1.5–4.5)
Glucose: 90 mg/dL (ref 70–99)
Potassium: 4.1 mmol/L (ref 3.5–5.2)
Sodium: 142 mmol/L (ref 134–144)
Total Protein: 6.3 g/dL (ref 6.0–8.5)
eGFR: 51 mL/min/1.73 — ABNORMAL LOW

## 2024-05-03 LAB — LIPID PANEL
Chol/HDL Ratio: 2.5 ratio (ref 0.0–4.4)
Cholesterol, Total: 143 mg/dL (ref 100–199)
HDL: 57 mg/dL
LDL Chol Calc (NIH): 74 mg/dL (ref 0–99)
Triglycerides: 55 mg/dL (ref 0–149)
VLDL Cholesterol Cal: 12 mg/dL (ref 5–40)

## 2024-05-03 LAB — INSULIN, RANDOM: INSULIN: 25.6 u[IU]/mL — ABNORMAL HIGH (ref 2.6–24.9)

## 2024-05-03 LAB — IRON AND TIBC
Iron Saturation: 31 % (ref 15–55)
Iron: 89 ug/dL (ref 27–139)
Total Iron Binding Capacity: 291 ug/dL (ref 250–450)
UIBC: 202 ug/dL (ref 118–369)

## 2024-05-03 LAB — VITAMIN B12: Vitamin B-12: >2000 pg/mL — ABNORMAL HIGH (ref 232–1245)

## 2024-05-03 LAB — HEMOGLOBIN A1C
Est. average glucose Bld gHb Est-mCnc: 105 mg/dL
Hgb A1c MFr Bld: 5.3 % (ref 4.8–5.6)

## 2024-05-03 LAB — FERRITIN: Ferritin: 56 ng/mL (ref 15–150)

## 2024-05-03 LAB — VITAMIN B1: Thiamine: 124.9 nmol/L (ref 66.5–200.0)

## 2024-05-03 LAB — TSH RFX ON ABNORMAL TO FREE T4: TSH: 3.31 u[IU]/mL (ref 0.450–4.500)

## 2024-05-04 ENCOUNTER — Ambulatory Visit: Payer: Self-pay | Admitting: Family Medicine

## 2024-06-26 ENCOUNTER — Ambulatory Visit: Admitting: Nurse Practitioner

## 2024-09-14 ENCOUNTER — Ambulatory Visit: Admitting: Allergy
